# Patient Record
Sex: Male | Born: 1980 | Race: Black or African American | Hispanic: No | Smoking: Current every day smoker
Health system: Southern US, Community
[De-identification: ages and names within clinical notes are randomized; demographics above are authoritative.]

## PROBLEM LIST (undated history)

## (undated) DIAGNOSIS — I10 Essential (primary) hypertension: Secondary | ICD-10-CM

## (undated) DIAGNOSIS — I509 Heart failure, unspecified: Secondary | ICD-10-CM

## (undated) DIAGNOSIS — J45909 Unspecified asthma, uncomplicated: Secondary | ICD-10-CM

## (undated) DIAGNOSIS — I2699 Other pulmonary embolism without acute cor pulmonale: Secondary | ICD-10-CM

## (undated) DIAGNOSIS — F1010 Alcohol abuse, uncomplicated: Secondary | ICD-10-CM

## (undated) DIAGNOSIS — F329 Major depressive disorder, single episode, unspecified: Secondary | ICD-10-CM

## (undated) DIAGNOSIS — F32A Depression, unspecified: Secondary | ICD-10-CM

## (undated) HISTORY — DX: Other pulmonary embolism without acute cor pulmonale: I26.99

## (undated) HISTORY — PX: FRACTURE SURGERY: SHX138

## (undated) HISTORY — DX: Depression, unspecified: F32.A

---
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
## 2007-10-18 ENCOUNTER — Emergency Department (HOSPITAL_COMMUNITY): Admission: EM | Admit: 2007-10-18 | Discharge: 2007-10-18 | Payer: Self-pay | Admitting: Emergency Medicine

## 2012-04-17 ENCOUNTER — Encounter (HOSPITAL_BASED_OUTPATIENT_CLINIC_OR_DEPARTMENT_OTHER): Payer: Self-pay | Admitting: *Deleted

## 2012-04-17 ENCOUNTER — Emergency Department (HOSPITAL_BASED_OUTPATIENT_CLINIC_OR_DEPARTMENT_OTHER)
Admission: EM | Admit: 2012-04-17 | Discharge: 2012-04-17 | Disposition: A | Discharge status: Home / Self Care | Payer: Managed Care, Other (non HMO) | Attending: Emergency Medicine | Admitting: Emergency Medicine

## 2012-04-17 ENCOUNTER — Emergency Department (HOSPITAL_BASED_OUTPATIENT_CLINIC_OR_DEPARTMENT_OTHER): Payer: Managed Care, Other (non HMO)

## 2012-04-17 DIAGNOSIS — F17200 Nicotine dependence, unspecified, uncomplicated: Secondary | ICD-10-CM | POA: Insufficient documentation

## 2012-04-17 DIAGNOSIS — M25579 Pain in unspecified ankle and joints of unspecified foot: Secondary | ICD-10-CM | POA: Insufficient documentation

## 2012-04-17 NOTE — ED Notes (Signed)
Pt c/o upper left ankle pain x 2 days after running.

## 2012-04-17 NOTE — ED Provider Notes (Signed)
History     CSN: [id number]  Arrival date & time 04/17/12  1321   First MD Initiated Contact with Patient 04/17/12 1411      Chief Complaint  Patient presents with  . Ankle Pain    (Consider location/radiation/quality/duration/timing/severity/associated sxs/prior treatment) Patient is a 31 y.o. male presenting with ankle pain. The history is provided by the patient.  Ankle Pain  The incident occurred 2 days ago. The incident occurred at home. Injury mechanism: has been exercising, running hills. The pain is present in the left ankle. The quality of the pain is described as sharp. The pain is moderate. Pain course: hurts with ambulation, bearing weight. The symptoms are aggravated by bearing weight.    History reviewed. No pertinent past medical history.  History reviewed. No pertinent past surgical history.  History reviewed. No pertinent family history.  History  Substance Use Topics  . Smoking status: Current Everyday Smoker -- 1.0 packs/day  . Smokeless tobacco: Not on file  . Alcohol Use: No      Review of Systems  All other systems reviewed and are negative.    Allergies  Review of patient's allergies indicates no known allergies.  Home Medications  No current outpatient prescriptions on file.  BP 149/80  Pulse 82  Temp 98.7 F (37.1 C) (Oral)  Resp 16  Ht 6\' 1"  (1.854 m)  Wt 183 lb (83.008 kg)  BMI 24.14 kg/m2  SpO2 100%  Physical Exam  Nursing note and vitals reviewed. Constitutional: He is oriented to person, place, and time. He appears well-developed and well-nourished. No distress.  HENT:  Head: Normocephalic and atraumatic.  Neck: Normal range of motion. Neck supple.  Musculoskeletal:       There is ttp over the distal fibula.  There is mild swelling and no deformity.    Neurological: He is alert and oriented to person, place, and time.  Skin: Skin is warm and dry. He is not diaphoretic.    ED Course  Procedures (including critical care  time)  Labs Reviewed - No data to display No results found.   No diagnosis found.    MDM  The xrays look okay.  He will be discharged to home with nsaids, rest, follow up if not improving.          Geoffery Lyons, MD 04/17/12 1447

## 2012-10-27 ENCOUNTER — Emergency Department (HOSPITAL_BASED_OUTPATIENT_CLINIC_OR_DEPARTMENT_OTHER)
Admission: EM | Admit: 2012-10-27 | Discharge: 2012-10-27 | Disposition: A | Discharge status: Home / Self Care | Payer: Managed Care, Other (non HMO) | Attending: Emergency Medicine | Admitting: Emergency Medicine

## 2012-10-27 ENCOUNTER — Encounter (HOSPITAL_BASED_OUTPATIENT_CLINIC_OR_DEPARTMENT_OTHER): Payer: Self-pay | Admitting: *Deleted

## 2012-10-27 ENCOUNTER — Emergency Department (HOSPITAL_BASED_OUTPATIENT_CLINIC_OR_DEPARTMENT_OTHER): Payer: Managed Care, Other (non HMO)

## 2012-10-27 DIAGNOSIS — M7989 Other specified soft tissue disorders: Secondary | ICD-10-CM | POA: Insufficient documentation

## 2012-10-27 DIAGNOSIS — M21371 Foot drop, right foot: Secondary | ICD-10-CM

## 2012-10-27 DIAGNOSIS — F17200 Nicotine dependence, unspecified, uncomplicated: Secondary | ICD-10-CM | POA: Insufficient documentation

## 2012-10-27 DIAGNOSIS — M216X9 Other acquired deformities of unspecified foot: Secondary | ICD-10-CM | POA: Insufficient documentation

## 2012-10-27 NOTE — ED Provider Notes (Signed)
Medical screening examination/treatment/procedure(s) were conducted as a shared visit with non-physician practitioner(s) and myself.  I personally evaluated the patient during the encounter   Pt with chronic right peroneal nerve palsy, reports was horsing around, wrestling and injured his ankle.  By next day, had difficulty bringing foot up.  No other numbness or weakness, no h/o sig back injury.  Cousin has muscular dystrophy and DM runs in family, but pt reports he is otherwise healthy.  Denies any sig lower leg injuries in the past except a leg fracture when he was 32 years old, pt isn't even sure which leg he fractured. Denies knee pain or injury.  Plain films here are neg.  I doubt this is lumbar radiculopathy as no pain or injury, fever.  Pt may need nerve conduction study or soft tissue imaging such as MRI or CT of lower leg as outpt, will discuss and refer to Dr. Dion Saucier.  Gavin Pound. Artie Mcintyre, MD 10/27/12 1726

## 2012-10-27 NOTE — ED Provider Notes (Signed)
History     CSN: [id number]  Arrival date & time 10/27/12  1500   First MD Initiated Contact with Patient 10/27/12 1524      Chief Complaint  Patient presents with  . Foot Pain    (Consider location/radiation/quality/duration/timing/severity/associated sxs/prior treatment) Patient is a 32 y.o. male presenting with foot injury. The history is provided by the patient. No language interpreter was used.  Foot Injury  Incident onset: 1 month. Incident location: twist injury 1 month ago. The injury mechanism was torsion. The pain is present in the right foot. The quality of the pain is described as aching. The pain is mild. The pain has been constant since onset. Associated symptoms include inability to bear weight. He reports no foreign bodies present. The symptoms are aggravated by bearing weight. He has tried nothing for the symptoms.  Pt reports he can not pull toes up.  Pt reports he injured foot a month ago but has continued to be unable to lift foot up.  Pt reports problems with walking because he has to lift foot up high and place down carefully in able to step  History reviewed. No pertinent past medical history.  History reviewed. No pertinent past surgical history.  History reviewed. No pertinent family history.  History  Substance Use Topics  . Smoking status: Current Every Day Smoker -- 1.0 packs/day  . Smokeless tobacco: Not on file  . Alcohol Use: No      Review of Systems  Musculoskeletal: Positive for joint swelling and gait problem. Negative for myalgias and back pain.  All other systems reviewed and are negative.    Allergies  Review of patient's allergies indicates no known allergies.  Home Medications  No current outpatient prescriptions on file.  BP 150/90  Pulse 78  Temp 98.1 F (36.7 C) (Oral)  Resp 18  Ht 6' 1.5" (1.867 m)  Wt 180 lb (81.647 kg)  BMI 23.43 kg/m2  SpO2 100%  Physical Exam  Nursing note and vitals reviewed. Constitutional:  He is oriented to person, place, and time. He appears well-developed and well-nourished.  HENT:  Head: Normocephalic.  Right Ear: External ear normal.  Left Ear: External ear normal.  Eyes: Pupils are equal, round, and reactive to light.  Pulmonary/Chest: Effort normal.  Musculoskeletal: He exhibits no edema and no tenderness.       Inability to plantar flex with foot,  Good strength with dorsal flexion.  nv and ns intact  Neurological: He is alert and oriented to person, place, and time. He has normal reflexes.  Skin: Skin is warm.  Psychiatric: He has a normal mood and affect.    ED Course  Procedures (including critical care time)  Labs Reviewed - No data to display No results found.   No diagnosis found.    MDM  Dr.Ghim in to see and examine pt.   X rays no abnormality   I called Dr. Jeannett Senior.   He will see pt in the office this week.   I counseled pt on results  Lonia Skinner [place], [place] 10/27/12 1718

## 2012-10-27 NOTE — ED Notes (Signed)
Pt c/o right foot pain x 1 month

## 2013-05-06 ENCOUNTER — Emergency Department (HOSPITAL_BASED_OUTPATIENT_CLINIC_OR_DEPARTMENT_OTHER)
Admission: EM | Admit: 2013-05-06 | Discharge: 2013-05-06 | Disposition: A | Discharge status: Home / Self Care | Payer: Managed Care, Other (non HMO) | Attending: Emergency Medicine | Admitting: Emergency Medicine

## 2013-05-06 ENCOUNTER — Encounter (HOSPITAL_BASED_OUTPATIENT_CLINIC_OR_DEPARTMENT_OTHER): Payer: Self-pay | Admitting: *Deleted

## 2013-05-06 ENCOUNTER — Emergency Department (HOSPITAL_BASED_OUTPATIENT_CLINIC_OR_DEPARTMENT_OTHER): Payer: Managed Care, Other (non HMO)

## 2013-05-06 DIAGNOSIS — Z79899 Other long term (current) drug therapy: Secondary | ICD-10-CM | POA: Insufficient documentation

## 2013-05-06 DIAGNOSIS — F17200 Nicotine dependence, unspecified, uncomplicated: Secondary | ICD-10-CM | POA: Insufficient documentation

## 2013-05-06 DIAGNOSIS — R0789 Other chest pain: Secondary | ICD-10-CM | POA: Insufficient documentation

## 2013-05-06 LAB — CBC WITH DIFFERENTIAL/PLATELET
Basophils Absolute: 0 10*3/uL (ref 0.0–0.1)
Basophils Relative: 0 % (ref 0–1)
Eosinophils Absolute: 0.1 10*3/uL (ref 0.0–0.7)
Eosinophils Relative: 2 % (ref 0–5)
HCT: 39.7 % (ref 39.0–52.0)
Hemoglobin: 14.3 g/dL (ref 13.0–17.0)
Lymphocytes Relative: 26 % (ref 12–46)
Lymphs Abs: 2 10*3/uL (ref 0.7–4.0)
MCH: 28.3 pg (ref 26.0–34.0)
MCHC: 36 g/dL (ref 30.0–36.0)
MCV: 78.6 fL (ref 78.0–100.0)
Monocytes Absolute: 0.7 10*3/uL (ref 0.1–1.0)
Monocytes Relative: 10 % (ref 3–12)
Neutro Abs: 4.9 10*3/uL (ref 1.7–7.7)
Neutrophils Relative %: 63 % (ref 43–77)
Platelets: 198 10*3/uL (ref 150–400)
RBC: 5.05 MIL/uL (ref 4.22–5.81)
RDW: 13.6 % (ref 11.5–15.5)
WBC: 7.8 10*3/uL (ref 4.0–10.5)

## 2013-05-06 LAB — COMPREHENSIVE METABOLIC PANEL
ALT: 15 U/L (ref 0–53)
AST: 21 U/L (ref 0–37)
Albumin: 4.3 g/dL (ref 3.5–5.2)
Alkaline Phosphatase: 53 U/L (ref 39–117)
BUN: 16 mg/dL (ref 6–23)
CO2: 31 mEq/L (ref 19–32)
Calcium: 10.6 mg/dL — ABNORMAL HIGH (ref 8.4–10.5)
Chloride: 100 mEq/L (ref 96–112)
Creatinine, Ser: 0.9 mg/dL (ref 0.50–1.35)
GFR calc Af Amer: >90 mL/min (ref >90)
GFR calc non Af Amer: >90 mL/min (ref >90)
Glucose, Bld: 99 mg/dL (ref 70–99)
Potassium: 4.4 mEq/L (ref 3.5–5.1)
Sodium: 141 mEq/L (ref 135–145)
Total Bilirubin: 0.4 mg/dL (ref 0.3–1.2)
Total Protein: 7.6 g/dL (ref 6.0–8.3)

## 2013-05-06 LAB — D-DIMER, QUANTITATIVE: D-Dimer, Quant: 0.29 ug/mL-FEU (ref 0.00–0.48)

## 2013-05-06 LAB — LIPASE, BLOOD: Lipase: 34 U/L (ref 11–59)

## 2013-05-06 LAB — TROPONIN I: Troponin I: <0.3 ng/mL (ref <0.30)

## 2013-05-06 MED ORDER — OMEPRAZOLE 20 MG PO CPDR: 20.0000 mg | DELAYED_RELEASE_CAPSULE | Freq: Every day | ORAL | Status: DC

## 2013-05-06 MED ORDER — GI COCKTAIL ~~LOC~~
30.0000 mL | Freq: Once | ORAL | Status: AC
  Administered 2013-05-06: 30 mL via ORAL
  Filled 2013-05-06: qty 30

## 2013-05-06 NOTE — ED Provider Notes (Signed)
History    CSN: [id number] Arrival date & time 05/06/13  3664  First MD Initiated Contact with Patient 05/06/13 [phone]     Chief Complaint  Patient presents with  . Chest Pain   (Consider location/radiation/quality/duration/timing/severity/associated sxs/prior Treatment) HPI Comments: Patient presents with central substernal chest pain has been constant since midnight last night. His been going on for the past 8 hours and has been intermittent.  It is not improved, lasting for a few seconds at a time at most.. It does not radiate. He has not had this pain before. It is worse with deep breathing and worse with eating. It is worse with certain positions. Denies any previous medical problems her cardiac history. No history of hypertension. He is a smoker. Denies any shortness of breath, nausea, diaphoresis, leg pain or swelling. Denies any abdominal pain vomiting, fever chills.  The history is provided by the patient.   History reviewed. No pertinent past medical history. History reviewed. No pertinent past surgical history. No family history on file. History  Substance Use Topics  . Smoking status: Current Every Day Smoker -- 1.00 packs/day  . Smokeless tobacco: Not on file  . Alcohol Use: Yes    Review of Systems  Constitutional: Negative for activity change and appetite change.  HENT: Negative for congestion and rhinorrhea.   Respiratory: Positive for chest tightness. Negative for cough and shortness of breath.   Cardiovascular: Positive for chest pain.  Gastrointestinal: Negative for nausea, vomiting and abdominal pain.  Genitourinary: Negative for dysuria and hematuria.  Musculoskeletal: Negative for back pain.  Skin: Negative for rash.  Neurological: Negative for dizziness, weakness and headaches.  A complete 10 system review of systems was obtained and all systems are negative except as noted in the HPI and PMH.    Allergies  Review of patient's allergies indicates no  known allergies.  Home Medications   Current Outpatient Rx  Name  Route  Sig  Dispense  Refill  . Multiple Vitamins-Minerals (MULTIVITAMIN WITH MINERALS) tablet   Oral   Take 1 tablet by mouth daily.         Marland Kitchen omeprazole (PRILOSEC) 20 MG capsule   Oral   Take 1 capsule (20 mg total) by mouth daily.   30 capsule   0    BP 141/86  Pulse 62  Temp(Src) 98 F (36.7 C) (Oral)  Resp 18  SpO2 100% Physical Exam  Constitutional: He is oriented to person, place, and time. He appears well-developed and well-nourished. No distress.  HENT:  Head: Normocephalic and atraumatic.  Mouth/Throat: Oropharynx is clear and moist. No oropharyngeal exudate.  Eyes: Conjunctivae and EOM are normal. Pupils are equal, round, and reactive to light.  Neck: Normal range of motion. Neck supple.  Cardiovascular: Normal rate, regular rhythm, normal heart sounds and intact distal pulses.   No murmur heard. Pulmonary/Chest: Effort normal and breath sounds normal. No respiratory distress. He exhibits no tenderness.  Abdominal: Soft. There is no tenderness. There is no rebound and no guarding.  Musculoskeletal: Normal range of motion. He exhibits no edema and no tenderness.  Neurological: He is alert and oriented to person, place, and time. No cranial nerve deficit. He exhibits normal muscle tone. Coordination normal.  Skin: Skin is warm.    ED Course  Procedures (including critical care time) Labs Reviewed  COMPREHENSIVE METABOLIC PANEL - Abnormal; Notable for the following:    Calcium 10.6 (*)    All other components within normal limits  CBC WITH  DIFFERENTIAL  TROPONIN I  D-DIMER, QUANTITATIVE  LIPASE, BLOOD   Dg Chest 2 View  05/06/2013   *RADIOLOGY REPORT*  Clinical Data: Chest pain.  CHEST - 2 VIEW  Comparison: No priors.  Findings: Lung volumes are normal.  No consolidative airspace disease.  No pleural effusions.  No pneumothorax.  No pulmonary nodule or mass noted.  Pulmonary vasculature and  the cardiomediastinal silhouette are within normal limits.  IMPRESSION: 1. No radiographic evidence of acute cardiopulmonary disease.   Original Report Authenticated By: Trudie Reed, M.D.   1. Atypical chest pain     MDM  Substernal chest pain since last night that is intermittent for the past 8 hours, lasting a few seconds at a time.. Worse with deep breathing and worse with position change. Atypical for ACS. LVH on EKG.  CXR negative. Troponin and D-dimer negative. No change with GI cocktail. No effusion seen on [place].  Doubt ACS, PE, dissection, PTX, pneumonia. Will treat with PPI, follow up with PCP regarding LVH and hypertension.   Date: 05/06/2013  Rate: 70  Rhythm: normal sinus rhythm  QRS Axis: normal  Intervals: normal  ST/T Wave abnormalities: nonspecific ST/T changes and early repolarization  Conduction Disutrbances:none  Narrative Interpretation: no brugada, no prolonged QT  Old EKG Reviewed: none available   Date: 05/06/2013  Rate: 64  Rhythm: normal sinus rhythm  QRS Axis: normal  Intervals: normal  ST/T Wave abnormalities: nonspecific ST/T changes and early repolarization  Conduction Disutrbances:none  Narrative Interpretation:   Old EKG Reviewed: unchanged      EMERGENCY DEPARTMENT [place] CARDIAC EXAM "Study: Limited Ultrasound of the heart and pericardium"  INDICATIONS: chest pain Multiple views of the heart and pericardium are obtained with a multi-frequency probe.  PERFORMED ZO:[id number]  IMAGES ARCHIVED?: Yes  FINDINGS: No pericardial effusion, Normal contractility and Tamponade physiology absent  LIMITATIONS:    VIEWS USED: Subcostal 4 chamber and Apical 4 chamber   INTERPRETATION: Cardiac activity present, Pericardial effusioin absent, Cardiac tamponade absent and Normal contractility  COMMENT:     Glynn Octave, MD 05/06/13 1242

## 2013-05-06 NOTE — ED Notes (Addendum)
Patient states that he woke up last night around 2300 with central chest pain, hurts to take a breath. Tried to eat breakfast, but chest pain after eating that was intense. States that if he tries to drink anything it hurts worse & certain positions make it hurt more

## 2014-11-24 ENCOUNTER — Emergency Department (HOSPITAL_BASED_OUTPATIENT_CLINIC_OR_DEPARTMENT_OTHER)
Admission: EM | Admit: 2014-11-24 | Discharge: 2014-11-24 | Disposition: A | Discharge status: Home / Self Care | Payer: BLUE CROSS/BLUE SHIELD | Attending: Emergency Medicine | Admitting: Emergency Medicine

## 2014-11-24 ENCOUNTER — Encounter (HOSPITAL_BASED_OUTPATIENT_CLINIC_OR_DEPARTMENT_OTHER): Payer: Self-pay | Admitting: Emergency Medicine

## 2014-11-24 ENCOUNTER — Emergency Department (HOSPITAL_BASED_OUTPATIENT_CLINIC_OR_DEPARTMENT_OTHER): Payer: BLUE CROSS/BLUE SHIELD

## 2014-11-24 DIAGNOSIS — Z720 Tobacco use: Secondary | ICD-10-CM | POA: Diagnosis not present

## 2014-11-24 DIAGNOSIS — X58XXXA Exposure to other specified factors, initial encounter: Secondary | ICD-10-CM | POA: Diagnosis not present

## 2014-11-24 DIAGNOSIS — Y9389 Activity, other specified: Secondary | ICD-10-CM | POA: Diagnosis not present

## 2014-11-24 DIAGNOSIS — S99921A Unspecified injury of right foot, initial encounter: Secondary | ICD-10-CM | POA: Diagnosis present

## 2014-11-24 DIAGNOSIS — S96911A Strain of unspecified muscle and tendon at ankle and foot level, right foot, initial encounter: Secondary | ICD-10-CM

## 2014-11-24 DIAGNOSIS — Y9289 Other specified places as the place of occurrence of the external cause: Secondary | ICD-10-CM | POA: Insufficient documentation

## 2014-11-24 DIAGNOSIS — Y998 Other external cause status: Secondary | ICD-10-CM | POA: Diagnosis not present

## 2014-11-24 MED ORDER — TRAMADOL HCL 50 MG PO TABS: 50.0000 mg | ORAL_TABLET | Freq: Four times a day (QID) | ORAL | Status: DC | PRN

## 2014-11-24 NOTE — ED Notes (Signed)
NP at bedside.

## 2014-11-24 NOTE — ED Notes (Signed)
Right foot pain after injury on Saturday.

## 2014-11-24 NOTE — Discharge Instructions (Signed)
Foot Sprain The muscles and cord like structures which attach muscle to bone (tendons) that surround the feet are made up of units. A foot sprain can occur at the weakest spot in any of these units. This condition is most often caused by injury to or overuse of the foot, as from playing contact sports, or aggravating a previous injury, or from poor conditioning, or obesity. SYMPTOMS  Pain with movement of the foot.  Tenderness and swelling at the injury site.  Loss of strength is present in moderate or severe sprains. THE THREE GRADES OR SEVERITY OF FOOT SPRAIN ARE:  Mild (Grade I): Slightly pulled muscle without tearing of muscle or tendon fibers or loss of strength.  Moderate (Grade II): Tearing of fibers in a muscle, tendon, or at the attachment to bone, with small decrease in strength.  Severe (Grade III): Rupture of the muscle-tendon-bone attachment, with separation of fibers. Severe sprain requires surgical repair. Often repeating (chronic) sprains are caused by overuse. Sudden (acute) sprains are caused by direct injury or over-use. DIAGNOSIS  Diagnosis of this condition is usually by your own observation. If problems continue, a caregiver may be required for further evaluation and treatment. X-rays may be required to make sure there are not breaks in the bones (fractures) present. Continued problems may require physical therapy for treatment. PREVENTION  Use strength and conditioning exercises appropriate for your sport.  Warm up properly prior to working out.  Use athletic shoes that are made for the sport you are participating in.  Allow adequate time for healing. Early return to activities makes repeat injury more likely, and can lead to an unstable arthritic foot that can result in prolonged disability. Mild sprains generally heal in 3 to 10 days, with moderate and severe sprains taking 2 to 10 weeks. Your caregiver can help you determine the proper time required for  healing. HOME CARE INSTRUCTIONS   Apply ice to the injury for 15-20 minutes, 03-04 times per day. Put the ice in a plastic bag and place a towel between the bag of ice and your skin.  An elastic wrap (like an Ace bandage) may be used to keep swelling down.  Keep foot above the level of the heart, or at least raised on a footstool, when swelling and pain are present.  Try to avoid use other than gentle range of motion while the foot is painful. Do not resume use until instructed by your caregiver. Then begin use gradually, not increasing use to the point of pain. If pain does develop, decrease use and continue the above measures, gradually increasing activities that do not cause discomfort, until you gradually achieve normal use.  Use crutches if and as instructed, and for the length of time instructed.  Keep injured foot and ankle wrapped between treatments.  Massage foot and ankle for comfort and to keep swelling down. Massage from the toes up towards the knee.  Only take over-the-counter or prescription medicines for pain, discomfort, or fever as directed by your caregiver. SEEK IMMEDIATE MEDICAL CARE IF:   Your pain and swelling increase, or pain is not controlled with medications.  You have loss of feeling in your foot or your foot turns cold or blue.  You develop new, unexplained symptoms, or an increase of the symptoms that brought you to your caregiver. MAKE SURE YOU:   Understand these instructions.  Will watch your condition.  Will get help right away if you are not doing well or get worse. Document Released:   03/25/2002 Document Revised: 12/26/2011 Document Reviewed: 05/22/2008 Exit[employer] Patient Information 2015 [employer]. This information is not intended to replace advice given to you by your health care provider. Make sure you discuss any questions you have with your health care provider.  

## 2014-11-24 NOTE — ED Provider Notes (Signed)
CSN: [id number]     Arrival date & time 11/24/14  1044 History   First MD Initiated Contact with Patient 11/24/14 1048     Chief Complaint  Patient presents with  . Foot Pain     (Consider location/radiation/quality/duration/timing/severity/associated sxs/prior Treatment) HPI Comments: Injured his right foot 2 days ago and is now having a large amount of pain along the right big toe  Patient is a 34 y.o. male presenting with lower extremity pain. The history is provided by the patient. No language interpreter was used.  Foot Pain This is a new problem. The current episode started today. The problem occurs constantly. The problem has been unchanged. Pertinent negatives include no fever, neck pain or numbness. The symptoms are aggravated by walking. He has tried nothing for the symptoms.    No past medical history on file. No past surgical history on file. No family history on file. History  Substance Use Topics  . Smoking status: Current Every Day Smoker -- 1.00 packs/day  . Smokeless tobacco: Not on file  . Alcohol Use: Yes    Review of Systems  Constitutional: Negative for fever.  Musculoskeletal: Negative for neck pain.  Neurological: Negative for numbness.  All other systems reviewed and are negative.     Allergies  Review of patient's allergies indicates no known allergies.  Home Medications   Prior to Admission medications   Medication Sig Start Date End Date Taking? Authorizing Provider  Multiple Vitamins-Minerals (MULTIVITAMIN WITH MINERALS) tablet Take 1 tablet by mouth daily.    Historical Provider, MD  omeprazole (PRILOSEC) 20 MG capsule Take 1 capsule (20 mg total) by mouth daily. 05/06/13   Glynn Octave, MD   BP 140/86 mmHg  Pulse 67  Temp(Src) 98.2 F (36.8 C)  Resp 16  Ht 6\' 1"  (1.854 m)  Wt 180 lb (81.647 kg)  BMI 23.75 kg/m2  SpO2 100% Physical Exam  Constitutional: He is oriented to person, place, and time. He appears well-developed and  well-nourished.  Cardiovascular: Normal rate and regular rhythm.   Pulmonary/Chest: Effort normal and breath sounds normal. No respiratory distress.  Musculoskeletal: Normal range of motion.  Mild swelling to the right great toe  Neurological: He is alert and oriented to person, place, and time.  Skin: Skin is warm and dry.  Psychiatric: He has a normal mood and affect.  Nursing note and vitals reviewed.   ED Course  Procedures (including critical care time) Labs Review Labs Reviewed - No data to display  Imaging Review Dg Foot Complete Right  11/24/2014   CLINICAL DATA:  Anterior right foot pain after tripping 2 days ago.  EXAM: RIGHT FOOT COMPLETE - 3+ VIEW  COMPARISON:  10/27/2012  FINDINGS: There is no evidence of fracture or dislocation. There is no evidence of arthropathy or other focal bone abnormality. Soft tissues are unremarkable.  IMPRESSION: Negative.   Electronically Signed   By: Charlett Nose M.D.   On: 11/24/2014 11:43     EKG Interpretation None      MDM   Final diagnoses:  Right foot strain, initial encounter    No acute bony injury. Pt placed in post op shoe for comfort. Given ultram and follow up with DR. Vivi Barrack, NP 11/24/14 1149  Vida Roller, MD 11/25/14 1002

## 2015-10-12 ENCOUNTER — Encounter (HOSPITAL_BASED_OUTPATIENT_CLINIC_OR_DEPARTMENT_OTHER): Payer: Self-pay | Admitting: Emergency Medicine

## 2015-10-12 ENCOUNTER — Emergency Department (HOSPITAL_BASED_OUTPATIENT_CLINIC_OR_DEPARTMENT_OTHER)
Admission: EM | Admit: 2015-10-12 | Discharge: 2015-10-12 | Disposition: A | Discharge status: Home / Self Care | Payer: Managed Care, Other (non HMO) | Attending: Emergency Medicine | Admitting: Emergency Medicine

## 2015-10-12 DIAGNOSIS — F17200 Nicotine dependence, unspecified, uncomplicated: Secondary | ICD-10-CM | POA: Insufficient documentation

## 2015-10-12 DIAGNOSIS — S0990XA Unspecified injury of head, initial encounter: Secondary | ICD-10-CM | POA: Insufficient documentation

## 2015-10-12 DIAGNOSIS — I10 Essential (primary) hypertension: Secondary | ICD-10-CM | POA: Insufficient documentation

## 2015-10-12 DIAGNOSIS — Y9389 Activity, other specified: Secondary | ICD-10-CM | POA: Insufficient documentation

## 2015-10-12 DIAGNOSIS — Y92410 Unspecified street and highway as the place of occurrence of the external cause: Secondary | ICD-10-CM | POA: Insufficient documentation

## 2015-10-12 DIAGNOSIS — S46011A Strain of muscle(s) and tendon(s) of the rotator cuff of right shoulder, initial encounter: Secondary | ICD-10-CM

## 2015-10-12 DIAGNOSIS — Z79899 Other long term (current) drug therapy: Secondary | ICD-10-CM | POA: Insufficient documentation

## 2015-10-12 DIAGNOSIS — Y998 Other external cause status: Secondary | ICD-10-CM | POA: Insufficient documentation

## 2015-10-12 HISTORY — DX: Essential (primary) hypertension: I10

## 2015-10-12 MED ORDER — IBUPROFEN 600 MG PO TABS: 600.0000 mg | ORAL_TABLET | Freq: Four times a day (QID) | ORAL | Status: DC | PRN

## 2015-10-12 MED ORDER — IBUPROFEN 800 MG PO TABS
800.0000 mg | ORAL_TABLET | Freq: Once | ORAL | Status: AC
  Administered 2015-10-12: 800 mg via ORAL
  Filled 2015-10-12: qty 1
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
## 2015-12-01 DIAGNOSIS — I10 Essential (primary) hypertension: Secondary | ICD-10-CM | POA: Diagnosis present

## 2016-11-15 ENCOUNTER — Encounter (HOSPITAL_BASED_OUTPATIENT_CLINIC_OR_DEPARTMENT_OTHER): Payer: Self-pay | Admitting: *Deleted

## 2016-11-15 ENCOUNTER — Emergency Department (HOSPITAL_BASED_OUTPATIENT_CLINIC_OR_DEPARTMENT_OTHER)
Admission: EM | Admit: 2016-11-15 | Discharge: 2016-11-15 | Disposition: A | Discharge status: Home / Self Care | Payer: Managed Care, Other (non HMO) | Attending: Emergency Medicine | Admitting: Emergency Medicine

## 2016-11-15 DIAGNOSIS — Z79899 Other long term (current) drug therapy: Secondary | ICD-10-CM | POA: Insufficient documentation

## 2016-11-15 DIAGNOSIS — H81391 Other peripheral vertigo, right ear: Secondary | ICD-10-CM | POA: Insufficient documentation

## 2016-11-15 DIAGNOSIS — I10 Essential (primary) hypertension: Secondary | ICD-10-CM | POA: Insufficient documentation

## 2016-11-15 DIAGNOSIS — F17200 Nicotine dependence, unspecified, uncomplicated: Secondary | ICD-10-CM | POA: Insufficient documentation

## 2016-11-15 MED ORDER — MECLIZINE HCL 25 MG PO TABS: 25.0000 mg | ORAL_TABLET | Freq: Three times a day (TID) | ORAL | 0 refills | Status: DC | PRN

## 2016-11-15 MED FILL — MECLIZINE 25 MG TABLET: 25 | 10 days supply | Qty: 30 | Fill #0

## 2016-11-15 NOTE — ED Triage Notes (Signed)
Pt reports he started feeling dizzy yesterday and checked his bp at home, it was 170"s per pt. He called his pcp who called in a prescription for him. Pt states he is between insurances and ran out of the bp meds he was on for years, did not take x 1 week. States he took 2 doses last night, and a dose this morning.

## 2016-11-15 NOTE — ED Provider Notes (Signed)
[facility]   CSN: [id number] Arrival date & time: 11/15/16  0809     History   Chief Complaint Chief Complaint  Patient presents with  . Dizziness    HPI Mario Beck is a 36 y.o. male.  36 yo M with a cc of dizziness. Going on for past couple of days.  Denies chest pain,sob, abdominal pain.  Denies URI like symptoms. Worse with head movement, only lasts for a few seconds.  He is concerned about his blood pressure being elevated. Has been without his medications for a little bit and then just started taking them again recently. Had a high blood pressure at home.   The history is provided by the patient.  Dizziness  Quality:  Head spinning and imbalance Severity:  Moderate Onset quality:  Sudden Duration:  2 days Timing:  Constant Progression:  Unchanged Chronicity:  New Context: head movement and standing up   Relieved by:  Being still and change in position Worsened by:  Nothing Ineffective treatments:  None tried Associated symptoms: no chest pain, no diarrhea, no headaches, no palpitations, no shortness of breath and no vomiting     Past Medical History:  Diagnosis Date  . Hypertension     There are no active problems to display for this patient.   History reviewed. No pertinent surgical history.     Home Medications    Prior to Admission medications   Medication Sig Start Date End Date Taking? Authorizing Provider  lisinopril (PRINIVIL,ZESTRIL) 10 MG tablet Take 10 mg by mouth daily.   Yes Historical Provider, MD  ibuprofen (ADVIL,MOTRIN) 600 MG tablet Take 1 tablet (600 mg total) by mouth every 6 (six) hours as needed. 10/12/15   Lyndal Pulley, MD  meclizine (ANTIVERT) 25 MG tablet Take 1 tablet (25 mg total) by mouth 3 (three) times daily as needed. 11/15/16   Melene Plan, DO  Multiple Vitamins-Minerals (MULTIVITAMIN WITH MINERALS) tablet Take 1 tablet by mouth daily.    Historical Provider, MD    Family History History  reviewed. No pertinent family history.  Social History Social History  Substance Use Topics  . Smoking status: Current Every Day Smoker    Packs/day: 1.00  . Smokeless tobacco: Never Used  . Alcohol use Yes     Allergies   Shellfish allergy   Review of Systems Review of Systems  Constitutional: Negative for chills and fever.  HENT: Negative for congestion and facial swelling.   Eyes: Negative for discharge and visual disturbance.  Respiratory: Negative for shortness of breath.   Cardiovascular: Negative for chest pain and palpitations.  Gastrointestinal: Negative for abdominal pain, diarrhea and vomiting.  Musculoskeletal: Negative for arthralgias and myalgias.  Skin: Negative for color change and rash.  Neurological: Positive for dizziness. Negative for tremors, syncope and headaches.  Psychiatric/Behavioral: Negative for confusion and dysphoric mood.     Physical Exam Updated Vital Signs BP (!) 152/113 (BP Location: Right Arm)   Pulse 93   Temp 98.9 F (37.2 C) (Oral)   Resp 20   Ht 6\' 2"  (1.88 m)   Wt 195 lb (88.5 kg)   SpO2 100%   BMI 25.04 kg/m   Physical Exam  Constitutional: He is oriented to person, place, and time. He appears well-developed and well-nourished.  HENT:  Head: Normocephalic and atraumatic.  Eyes: EOM are normal. Pupils are equal, round, and reactive to light.  Neck: Normal range of motion. Neck supple. No JVD present.  Cardiovascular: Normal rate  and regular rhythm.  Exam reveals no gallop and no friction rub.   No murmur heard. Pulmonary/Chest: No respiratory distress. He has no wheezes.  Abdominal: He exhibits no distension and no mass. There is no tenderness. There is no rebound and no guarding.  Musculoskeletal: Normal range of motion.  Neurological: He is alert and oriented to person, place, and time. He has normal strength. No cranial nerve deficit or sensory deficit. He displays a negative Romberg sign. GCS eye subscore is 4. GCS  verbal subscore is 5. GCS motor subscore is 6. He displays no Babinski's sign on the right side. He displays no Babinski's sign on the left side.  Reflex Scores:      Tricep reflexes are 2+ on the right side and 2+ on the left side.      Bicep reflexes are 2+ on the right side and 2+ on the left side.      Brachioradialis reflexes are 2+ on the right side and 2+ on the left side.      Patellar reflexes are 2+ on the right side and 2+ on the left side.      Achilles reflexes are 2+ on the right side and 2+ on the left side. R sided fast going nystagmus  Skin: No rash noted. No pallor.  Psychiatric: He has a normal mood and affect. His behavior is normal.  Nursing note and vitals reviewed.    ED Treatments / Results  Labs (all labs ordered are listed, but only abnormal results are displayed) Labs Reviewed - No data to display  EKG  EKG Interpretation None       Radiology No results found.  Procedures Procedures (including critical care time)  Medications Ordered in ED Medications - No data to display   Initial Impression / Assessment and Plan / ED Course  I have reviewed the triage vital signs and the nursing notes.  Pertinent labs & imaging results that were available during my care of the patient were reviewed by me and considered in my medical decision making (see chart for details).     36 yo M With a chief complaints of dizziness. He describes this as a sensation of unsteadiness in the room spinning. He has right-sided chest going nystagmus on exam. Is able to ambulate without difficulty. Feel this most likely peripheral based on history and physical. Will start on meclizine. PCP follow-up.  9:12 AM:  I have discussed the diagnosis/risks/treatment options with the patient and family and believe the pt to be eligible for discharge home to follow-up with PCP. We also discussed returning to the ED immediately if new or worsening sx occur. We discussed the sx which are  most concerning (e.g., sudden worsening dizziness, unable to walk, constant symptoms) that necessitate immediate return. Medications administered to the patient during their visit and any new prescriptions provided to the patient are listed below.  Medications given during this visit Medications - No data to display   The patient appears reasonably screen and/or stabilized for discharge and I doubt any other medical condition or other [facility] requiring further screening, evaluation, or treatment in the ED at this time prior to discharge.    Final Clinical Impressions(s) / ED Diagnoses   Final diagnoses:  Peripheral vertigo involving right ear    New Prescriptions New Prescriptions   MECLIZINE (ANTIVERT) 25 MG TABLET    Take 1 tablet (25 mg total) by mouth 3 (three) times daily as needed.     Melene Plan,  DO 11/15/16 [id number]

## 2016-11-15 NOTE — Discharge Instructions (Signed)
Follow up with your PCP, return for worsening symptoms, sob, inability to walk

## 2018-01-23 ENCOUNTER — Other Ambulatory Visit: Payer: Self-pay

## 2018-01-23 ENCOUNTER — Encounter (HOSPITAL_BASED_OUTPATIENT_CLINIC_OR_DEPARTMENT_OTHER): Payer: Self-pay | Admitting: *Deleted

## 2018-01-23 ENCOUNTER — Emergency Department (HOSPITAL_BASED_OUTPATIENT_CLINIC_OR_DEPARTMENT_OTHER)
Admission: EM | Admit: 2018-01-23 | Discharge: 2018-01-23 | Disposition: A | Discharge status: Home / Self Care | Payer: Managed Care, Other (non HMO) | Attending: Emergency Medicine | Admitting: Emergency Medicine

## 2018-01-23 DIAGNOSIS — R61 Generalized hyperhidrosis: Secondary | ICD-10-CM | POA: Insufficient documentation

## 2018-01-23 DIAGNOSIS — F17210 Nicotine dependence, cigarettes, uncomplicated: Secondary | ICD-10-CM | POA: Diagnosis not present

## 2018-01-23 DIAGNOSIS — Z79899 Other long term (current) drug therapy: Secondary | ICD-10-CM | POA: Insufficient documentation

## 2018-01-23 DIAGNOSIS — I10 Essential (primary) hypertension: Secondary | ICD-10-CM | POA: Diagnosis not present

## 2018-01-23 MED ORDER — AMLODIPINE BESYLATE 10 MG PO TABS: 10.0000 mg | ORAL_TABLET | Freq: Every day | ORAL | 0 refills | Status: DC

## 2018-01-23 MED ORDER — AMLODIPINE BESYLATE 5 MG PO TABS
10.0000 mg | ORAL_TABLET | Freq: Once | ORAL | Status: AC
  Administered 2018-01-23: 10 mg via ORAL
  Filled 2018-01-23: qty 2

## 2018-01-23 MED FILL — AMLODIPINE BESYLATE 10 MG T: 10 | 30 days supply | Qty: 30 | Fill #0

## 2018-01-23 NOTE — ED Notes (Signed)
Signature pad not working in room. Pt verbalized understanding of d/c instructions and need for f/u.

## 2018-01-23 NOTE — ED Notes (Signed)
Pt on monitor 

## 2018-01-23 NOTE — Discharge Instructions (Signed)
Your blood pressure was elevated today. Eat a low salt/low sodium diet, and take all of your home blood pressure medications as directed. Keep a log of your blood pressure readings from every morning and evening (making sure to give yourself at least 15 minutes of rest prior to checking it) and take it to your doctor's office at your next appointment for ongoing management of your blood pressure. Stay well hydrated and get plenty of rest. Avoid caffeine and other over the counter products that would make your blood pressure go up (such as decongestants, excedrin, etc). Follow up with your regular doctor in 1 week for recheck of symptoms and ongoing management of your blood pressure. Return to the ER for emergent changes or worsening symptoms.   °

## 2018-01-23 NOTE — ED Triage Notes (Signed)
Pt has hx or Hypertension. Has been out of medication for 6 months. Doesn't have a PCP. Has been waking up out of his sleep soaking wet. Decided to come get checked out.

## 2018-01-23 NOTE — ED Provider Notes (Signed)
[facility] EMERGENCY DEPARTMENT Provider Note   CSN: [id number] Arrival date & time: 01/23/18  1026     History   Chief Complaint Chief Complaint  Patient presents with  . Hypertension    HPI Mario Beck is a 37 y.o. male with a PMHx of HTN, who presents to the ED with complaints of feeling that his blood pressure is elevated for the last week.  Patient states that he was previously on amlodipine 10 mg however he has been out of this for 6 months because he recently moved here.  Over the last 1 week he will occasionally get a jittery feeling like an anxious feeling and thinks this is when his blood pressure is up, although he is not checking it.  He also has noticed that he has been having night sweats.  He has been stressed lately with his job and thinks this is making his symptoms worse.  He has tried apple cider vinegar with no relief of his symptoms.  He was concerned that his blood pressure was elevated so he came in for further evaluation.  He denies any fevers, chills, diaphoresis, headache, vision changes, lightheadedness, dizziness, unintentional weight loss, cough, palpitations, leg swelling, CP, SOB, abd pain, N/V/D/C, hematuria, dysuria, myalgias, arthralgias, numbness, tingling, focal weakness, or any other complaints at this time.  He has not been homeless, traveled outside of the country, or recently been incarcerated.  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    The history is provided by the patient and medical records. No language interpreter was used.  Hypertension  This is a chronic problem. The current episode started more than 1 week ago. The problem occurs constantly. The problem has not changed since onset.Pertinent negatives include no chest pain, no abdominal pain, no headaches and no shortness of breath. The symptoms are aggravated by stress. Nothing relieves the symptoms. Treatments tried: apple cider vinegar. The treatment provided no relief.    Past Medical  History:  Diagnosis Date  . Hypertension     There are no active problems to display for this patient.   History reviewed. No pertinent surgical history.      Home Medications    Prior to Admission medications   Medication Sig Start Date End Date Taking? Authorizing Provider  ibuprofen (ADVIL,MOTRIN) 600 MG tablet Take 1 tablet (600 mg total) by mouth every 6 (six) hours as needed. 10/12/15   Lyndal Pulley, MD  lisinopril (PRINIVIL,ZESTRIL) 10 MG tablet Take 10 mg by mouth daily.    [provider]  meclizine (ANTIVERT) 25 MG tablet Take 1 tablet (25 mg total) by mouth 3 (three) times daily as needed. 11/15/16   Melene Plan, DO  Multiple Vitamins-Minerals (MULTIVITAMIN WITH MINERALS) tablet Take 1 tablet by mouth daily.    [provider]    Family History History reviewed. No pertinent family history.  Social History Social History   Tobacco Use  . Smoking status: Current Every Day Smoker    Packs/day: 1.00  . Smokeless tobacco: Never Used  Substance Use Topics  . Alcohol use: Yes  . Drug use: No     Allergies   Shellfish allergy   Review of Systems Review of Systems  Constitutional: Negative for chills, diaphoresis, fever and unexpected weight change.       +night sweats  Eyes: Negative for visual disturbance.  Respiratory: Negative for cough and shortness of breath.   Cardiovascular: Negative for chest pain, palpitations and leg swelling.  Gastrointestinal: Negative for abdominal  pain, constipation, diarrhea, nausea and vomiting.  Genitourinary: Negative for dysuria and hematuria.  Musculoskeletal: Negative for arthralgias and myalgias.  Skin: Negative for color change.  Allergic/Immunologic: Negative for immunocompromised state.  Neurological: Negative for dizziness, weakness, light-headedness, numbness and headaches.  Psychiatric/Behavioral: Negative for confusion. The patient is nervous/anxious.    All other systems reviewed and are  negative for acute change except as noted in the HPI.    Physical Exam Updated Vital Signs BP (!) 174/114 (BP Location: Right Arm) Comment: Patient is out of his BP medication  Pulse 95 Comment: Per PA Emery Dupuy, Pts heart rate while in the room with him.  Temp 98.6 F (37 C) (Oral)   Resp 18   Ht 6\' 2"  (1.88 m)   Wt 83.9 kg (185 lb)   SpO2 98%   BMI 23.75 kg/m    Physical Exam  Constitutional: He is oriented to person, place, and time. Vital signs are normal. He appears well-developed and well-nourished.  Non-toxic appearance. No distress.  Afebrile, nontoxic, NAD, BP elevated in the 170s/110s which is somewhat similar to prior visits  HENT:  Head: Normocephalic and atraumatic.  Mouth/Throat: Oropharynx is clear and moist and mucous membranes are normal.  Eyes: Conjunctivae and EOM are normal. Right eye exhibits no discharge. Left eye exhibits no discharge.  Neck: Normal range of motion. Neck supple.  Cardiovascular: Normal rate, regular rhythm, normal heart sounds and intact distal pulses. Exam reveals no gallop and no friction rub.  No murmur heard. HR in the 90s during entirety of visit; RRR, nl s1/s2, no m/r/g, distal pulses intact, no pedal edema   Pulmonary/Chest: Effort normal and breath sounds normal. No respiratory distress. He has no decreased breath sounds. He has no wheezes. He has no rhonchi. He has no rales.  CTAB in all lung fields, no w/r/r, no hypoxia or increased WOB, speaking in full sentences, SpO2 99% on RA   Abdominal: Soft. Normal appearance and bowel sounds are normal. He exhibits no distension. There is no tenderness. There is no rigidity, no rebound, no guarding, no CVA tenderness, no tenderness at McBurney's point and negative Murphy's sign.  Musculoskeletal: Normal range of motion.  Neurological: He is alert and oriented to person, place, and time. He has normal strength. No sensory deficit.  Skin: Skin is warm, dry and intact. No rash noted.  Psychiatric: He  has a normal mood and affect.  Nursing note and vitals reviewed.    ED Treatments / Results  Labs (all labs ordered are listed, but only abnormal results are displayed) Labs Reviewed - No data to display  EKG EKG Interpretation  Date/Time:  Tuesday January 23 2018 11:33:24 EDT Ventricular Rate:  98 PR Interval:    QRS Duration: 86 QT Interval:  384 QTC Calculation: 491 R Axis:   70 Text Interpretation:  Sinus rhythm Left ventricular hypertrophy Prolonged QT interval QT slightly more prolonged than previous  Confirmed by Richardean Canal [phone]) on 01/23/2018 12:52:04 PM   Radiology No results found.  Procedures Procedures (including critical care time)  Medications Ordered in ED Medications  amLODipine (NORVASC) tablet 10 mg (10 mg Oral Given 01/23/18 1144)     Initial Impression / Assessment and Plan / ED Course  I have reviewed the triage vital signs and the nursing notes.  Pertinent labs & imaging results that were available during my care of the patient were reviewed by me and considered in my medical decision making (see chart for details).  37 y.o. male here with concern about his HTN x1 wk. States he feels anxious and jittery, thinks his BP is high although he doesn't check it. He also has had night sweats. No other associated symptoms, otherwise he's completely asymptomatic. On exam, no tachycardia (this was originally documented, but during exam his HR was in the 90s the entire time), BP 170s/110s which is similar to prior visits in the past; no pedal edema, clear lung exam, no other concerning exam findings. He has no unintentional weight loss or cough to make me think about cancer/TB as source of his night sweats. It could just be from his HTN. Will give his home dose of meds here, and get EKG, however doubt need for labs given that he's completely asymptomatic with no s/sx of end-organ damage. Will reassess shortly.   1:38 PM EKG with LVH and borderline QT  prolongation, both slightly more pronounced than prior EKG in 2013/02/17, however overall otherwise fairly similar. No acute ischemic findings. BP continues to steadily improve here with his BP meds. Given lack of concerning s/sx, doubt need for further emergent work up at this time. Will provide refill of his HTN meds, advised DASH diet, keeping log of BP for his PCP visit, and f/up with PCP in 1wk for ongoing management of his HTN. Discussed avoidance of things that would exacerbate his HTN. I explained the diagnosis and have given explicit precautions to return to the ER including for any other new or worsening symptoms. The patient understands and accepts the medical plan as it's been dictated and I have answered their questions. Discharge instructions concerning home care and prescriptions have been given. The patient is STABLE and is discharged to home in good condition.    Final Clinical Impressions(s) / ED Diagnoses   Final diagnoses:  Essential hypertension  Night sweats    ED Discharge Orders        Ordered    amLODipine (NORVASC) 10 MG tablet  Daily     01/23/18 [address] [place], [place] 01/23/18 1338    Charlynne Pander, MD 01/24/18 [phone]

## 2018-02-01 ENCOUNTER — Emergency Department (HOSPITAL_BASED_OUTPATIENT_CLINIC_OR_DEPARTMENT_OTHER)
Admission: EM | Admit: 2018-02-01 | Discharge: 2018-02-01 | Disposition: A | Discharge status: Home / Self Care | Payer: Managed Care, Other (non HMO) | Attending: Emergency Medicine | Admitting: Emergency Medicine

## 2018-02-01 ENCOUNTER — Encounter (HOSPITAL_BASED_OUTPATIENT_CLINIC_OR_DEPARTMENT_OTHER): Payer: Self-pay | Admitting: Emergency Medicine

## 2018-02-01 ENCOUNTER — Emergency Department (HOSPITAL_BASED_OUTPATIENT_CLINIC_OR_DEPARTMENT_OTHER): Payer: Managed Care, Other (non HMO)

## 2018-02-01 ENCOUNTER — Other Ambulatory Visit: Payer: Self-pay

## 2018-02-01 DIAGNOSIS — R079 Chest pain, unspecified: Secondary | ICD-10-CM | POA: Diagnosis present

## 2018-02-01 DIAGNOSIS — I10 Essential (primary) hypertension: Secondary | ICD-10-CM | POA: Insufficient documentation

## 2018-02-01 DIAGNOSIS — R000 Tachycardia, unspecified: Secondary | ICD-10-CM

## 2018-02-01 DIAGNOSIS — R0789 Other chest pain: Secondary | ICD-10-CM

## 2018-02-01 DIAGNOSIS — R748 Abnormal levels of other serum enzymes: Secondary | ICD-10-CM | POA: Diagnosis not present

## 2018-02-01 DIAGNOSIS — J45909 Unspecified asthma, uncomplicated: Secondary | ICD-10-CM | POA: Insufficient documentation

## 2018-02-01 DIAGNOSIS — F17210 Nicotine dependence, cigarettes, uncomplicated: Secondary | ICD-10-CM | POA: Diagnosis not present

## 2018-02-01 DIAGNOSIS — R778 Other specified abnormalities of plasma proteins: Secondary | ICD-10-CM

## 2018-02-01 DIAGNOSIS — Z79899 Other long term (current) drug therapy: Secondary | ICD-10-CM | POA: Diagnosis not present

## 2018-02-01 DIAGNOSIS — R7989 Other specified abnormal findings of blood chemistry: Secondary | ICD-10-CM

## 2018-02-01 DIAGNOSIS — R002 Palpitations: Secondary | ICD-10-CM | POA: Insufficient documentation

## 2018-02-01 HISTORY — DX: Unspecified asthma, uncomplicated: J45.909

## 2018-02-01 LAB — CBC WITH DIFFERENTIAL/PLATELET
Basophils Absolute: 0 10*3/uL (ref 0.0–0.1)
Basophils Relative: 0 %
Eosinophils Absolute: 0.1 10*3/uL (ref 0.0–0.7)
Eosinophils Relative: 1 %
HCT: 39.7 % (ref 39.0–52.0)
Hemoglobin: 14.7 g/dL (ref 13.0–17.0)
Lymphocytes Relative: 10 %
Lymphs Abs: 0.8 10*3/uL (ref 0.7–4.0)
MCH: 29.1 pg (ref 26.0–34.0)
MCHC: 37 g/dL — ABNORMAL HIGH (ref 30.0–36.0)
MCV: 78.6 fL (ref 78.0–100.0)
Monocytes Absolute: 0.7 10*3/uL (ref 0.1–1.0)
Monocytes Relative: 9 %
Neutro Abs: 6.6 10*3/uL (ref 1.7–7.7)
Neutrophils Relative %: 80 %
Platelets: 249 10*3/uL (ref 150–400)
RBC: 5.05 MIL/uL (ref 4.22–5.81)
RDW: 14.2 % (ref 11.5–15.5)
WBC: 8.2 10*3/uL (ref 4.0–10.5)

## 2018-02-01 LAB — BASIC METABOLIC PANEL
Anion gap: 13 (ref 5–15)
BUN: 10 mg/dL (ref 6–20)
CO2: 24 mmol/L (ref 22–32)
Calcium: 9.2 mg/dL (ref 8.9–10.3)
Chloride: 101 mmol/L (ref 101–111)
Creatinine, Ser: 0.75 mg/dL (ref 0.61–1.24)
GFR calc Af Amer: >60 mL/min (ref >60)
GFR calc non Af Amer: >60 mL/min (ref >60)
Glucose, Bld: 93 mg/dL (ref 65–99)
Potassium: 3.8 mmol/L (ref 3.5–5.1)
Sodium: 138 mmol/L (ref 135–145)

## 2018-02-01 LAB — TROPONIN I
Troponin I: 0.03 ng/mL (ref <0.03)
Troponin I: 0.03 ng/mL (ref <0.03)

## 2018-02-01 LAB — D-DIMER, QUANTITATIVE: D-Dimer, Quant: 0.63 ug/mL-FEU — ABNORMAL HIGH (ref 0.00–0.50)

## 2018-02-01 MED ORDER — SODIUM CHLORIDE 0.9 % IV BOLUS
1000.0000 mL | Freq: Once | INTRAVENOUS | Status: AC
  Administered 2018-02-01: 1000 mL via INTRAVENOUS

## 2018-02-01 MED ORDER — IOPAMIDOL (ISOVUE-370) INJECTION 76%
100.0000 mL | Freq: Once | INTRAVENOUS | Status: AC | PRN
  Administered 2018-02-01: 100 mL via INTRAVENOUS

## 2018-02-01 MED ORDER — IOPAMIDOL (ISOVUE-300) INJECTION 61%: 100.0000 mL | Freq: Once | INTRAVENOUS | Status: DC | PRN

## 2018-02-01 NOTE — ED Triage Notes (Addendum)
Pt sts his bp is remaining high although he is taking the med prescribed for him in the ED. Sts this morning he could see his chest "palpitating really hard" on the left side so he came back to Ed. After triage pt added that he is "experiencing a mild pain" in his low sternal area and to the right low chest.

## 2018-02-01 NOTE — ED Provider Notes (Signed)
[facility] EMERGENCY DEPARTMENT Provider Note   CSN: [id number] Arrival date & time: 02/01/18  4098     History   Chief Complaint Chief Complaint  Patient presents with  . Hypertension    HPI Mario Beck is a 37 y.o. male.  37 yo M with a chief complaint of chest pain.  Patient states he is just felt the past day or 2.  Feels that he is a bit anxious and that his heart is been beating fast.  Has some mild discomfort to the right chest just underneath the nipple.  Seems to come and go.  Nothing really seems to make it better or worse.  Denies nausea or vomiting.  Patient has a history of poorly controlled hypertension and is not followed with his doctor regularly.  Is currently being treated by the emergency department.  He was seen 2 weeks ago and feels that his blood pressure should be down after starting amlodipine 10 mg.  The history is provided by the patient.  Hypertension  This is a new problem. The current episode started more than 1 week ago. The problem occurs constantly. The problem has not changed since onset.Associated symptoms include chest pain. Pertinent negatives include no abdominal pain, no headaches and no shortness of breath. Nothing aggravates the symptoms. Nothing relieves the symptoms. He has tried nothing for the symptoms. The treatment provided no relief.    Past Medical History:  Diagnosis Date  . Asthma   . Hypertension     There are no active problems to display for this patient.   History reviewed. No pertinent surgical history.      Home Medications    Prior to Admission medications   Medication Sig Start Date End Date Taking? Authorizing Provider  amLODipine (NORVASC) 10 MG tablet Take 1 tablet (10 mg total) by mouth daily. 01/23/18   Street, [place], PA-C  ibuprofen (ADVIL,MOTRIN) 600 MG tablet Take 1 tablet (600 mg total) by mouth every 6 (six) hours as needed. 10/12/15   Lyndal Pulley, MD  lisinopril (PRINIVIL,ZESTRIL) 10 MG  tablet Take 10 mg by mouth daily.    [provider]  meclizine (ANTIVERT) 25 MG tablet Take 1 tablet (25 mg total) by mouth 3 (three) times daily as needed. 11/15/16   Melene Plan, DO  Multiple Vitamins-Minerals (MULTIVITAMIN WITH MINERALS) tablet Take 1 tablet by mouth daily.    [provider]    Family History No family history on file.  Social History Social History   Tobacco Use  . Smoking status: Current Every Day Smoker    Packs/day: 1.00  . Smokeless tobacco: Never Used  Substance Use Topics  . Alcohol use: Yes    Comment: occ  . Drug use: No     Allergies   Lisinopril and Shellfish allergy   Review of Systems Review of Systems  Constitutional: Negative for chills and fever.  HENT: Negative for congestion and facial swelling.   Eyes: Negative for discharge and visual disturbance.  Respiratory: Negative for shortness of breath.   Cardiovascular: Positive for chest pain and palpitations.  Gastrointestinal: Negative for abdominal pain, diarrhea and vomiting.  Musculoskeletal: Negative for arthralgias and myalgias.  Skin: Negative for color change and rash.  Neurological: Negative for tremors, syncope and headaches.  Psychiatric/Behavioral: Negative for confusion and dysphoric mood.     Physical Exam Updated Vital Signs BP (!) 157/107   Pulse (!) 108   Temp 99 F (37.2 C) (Oral)   Resp (!) 21  SpO2 100%   Physical Exam  Constitutional: He is oriented to person, place, and time. He appears well-developed and well-nourished.  HENT:  Head: Normocephalic and atraumatic.  Eyes: Pupils are equal, round, and reactive to light. EOM are normal.  Neck: Normal range of motion. Neck supple. No JVD present.  Cardiovascular: Regular rhythm. Tachycardia present. Exam reveals no gallop and no friction rub.  No murmur heard. Pulmonary/Chest: No respiratory distress. He has no wheezes.  Abdominal: He exhibits no distension and no mass. There is no  tenderness. There is no rebound and no guarding.  Musculoskeletal: Normal range of motion.  Neurological: He is alert and oriented to person, place, and time.  Skin: No rash noted. No pallor.  Psychiatric: He has a normal mood and affect. His behavior is normal.  Nursing note and vitals reviewed.    ED Treatments / Results  Labs (all labs ordered are listed, but only abnormal results are displayed) Labs Reviewed  CBC WITH DIFFERENTIAL/PLATELET - Abnormal; Notable for the following components:      Result Value   MCHC 37.0 (*)    All other components within normal limits  D-DIMER, QUANTITATIVE (NOT AT [facility]) - Abnormal; Notable for the following components:   D-Dimer, Quant 0.63 (*)    All other components within normal limits  TROPONIN I - Abnormal; Notable for the following components:   Troponin I 0.03 (*)    All other components within normal limits  TROPONIN I - Abnormal; Notable for the following components:   Troponin I 0.03 (*)    All other components within normal limits  BASIC METABOLIC PANEL    EKG EKG Interpretation  Date/Time:  Thursday February 01 2018 09:45:25 EDT Ventricular Rate:  106 PR Interval:    QRS Duration: 83 QT Interval:  330 QTC Calculation: 439 R Axis:   62 Text Interpretation:  Sinus tachycardia Left ventricular hypertrophy new isolated flipped t in III Otherwise no significant change Confirmed by Melene Plan [phone]) on 02/01/2018 10:38:11 AM   Radiology Dg Chest 2 View  Result Date: 02/01/2018 CLINICAL DATA:  Chest pain EXAM: CHEST - 2 VIEW COMPARISON:  05/06/2013 FINDINGS: Heart and mediastinal contours are within normal limits. No focal opacities or effusions. No acute bony abnormality. IMPRESSION: No active cardiopulmonary disease. Electronically Signed   By: Charlett Nose M.D.   On: 02/01/2018 10:55   Ct Angio Chest Pe W And/or Wo Contrast  Result Date: 02/01/2018 CLINICAL DATA:  Hypertension EXAM: CT ANGIOGRAPHY CHEST WITH CONTRAST  TECHNIQUE: Multidetector CT imaging of the chest was performed using the standard protocol during bolus administration of intravenous contrast. Multiplanar CT image reconstructions and MIPs were obtained to evaluate the vascular anatomy. CONTRAST:  ISOVUE-370 IOPAMIDOL (ISOVUE-370) INJECTION 76% COMPARISON:  None. FINDINGS: Cardiovascular: Thoracic aorta demonstrates no aneurysmal dilatation or dissection. No significant atherosclerotic calcifications are seen. The pulmonary artery shows a normal branching pattern without filling defect to suggest pulmonary embolism. No cardiac enlargement is seen. No significant coronary calcifications are noted. Mediastinum/Nodes: The thoracic inlet is unremarkable. No hilar or mediastinal adenopathy is noted. The esophagus is within normal limits. Lungs/Pleura: The lungs are well aerated bilaterally. No focal infiltrate or sizable effusion is seen. No sizable parenchymal nodule is noted. Upper Abdomen: No acute abnormality. Musculoskeletal: No acute bony abnormality is noted. Review of the MIP images confirms the above findings. IMPRESSION: No evidence of pulmonary emboli.  No acute abnormality is noted. Electronically Signed   By: Eulah Pont.D.  On: 02/01/2018 12:06    Procedures Procedures (including critical care time)  Medications Ordered in ED Medications  sodium chloride 0.9 % bolus 1,000 mL (0 mLs Intravenous Stopped 02/01/18 1117)  iopamidol (ISOVUE-370) 76 % injection 100 mL (100 mLs Intravenous Contrast Given 02/01/18 1138)     Initial Impression / Assessment and Plan / ED Course  I have reviewed the triage vital signs and the nursing notes.  Pertinent labs & imaging results that were available during my care of the patient were reviewed by me and considered in my medical decision making (see chart for details).     37 yo M with a cc of chest pain.  Having some anxiety as well.  Pain to the right lower chest wall.  Nothing makes better or  worse.  Unable to [facility] due to tachycardia.   Old records were reviewed the patient was recently seen for a symptomatic hypertension started on amlodipine.  He recently moved to the area and has not yet established follow-up.  His troponin was elevated but at the very lowest end of the laboratory spectrum.  This was repeated and was again the same.  As it is not changing that reassuring but I am unsure why the patient has a positive troponin.  He has never had one in the ED before.  He is also persistently tachycardic.  CT scan of the chest was negative for PE.  I discussed the results of the patient and offered admission into the hospital.  At this point the patient is declining discharge and he will follow-up with his family physician in the office tomorrow.  He will return for any worsening symptoms.  CRITICAL CARE Performed by: Rae Roam   Total critical care time: 35 minutes  Critical care time was exclusive of separately billable procedures and treating other patients.  Critical care was necessary to treat or prevent imminent or life-threatening deterioration.  Critical care was time spent personally by me on the following activities: development of treatment plan with patient and/or surrogate as well as nursing, discussions with consultants, evaluation of patient's response to treatment, examination of patient, obtaining history from patient or surrogate, ordering and performing treatments and interventions, ordering and review of laboratory studies, ordering and review of radiographic studies, pulse oximetry and re-evaluation of patient's condition.  1:40 PM:  I have discussed the diagnosis/risks/treatment options with the patient and believe the pt to be eligible for discharge home to follow-up with PCP. We also discussed returning to the ED immediately if new or worsening sx occur. We discussed the sx which are most concerning (e.g., sudden worsening pain, fever, inability to  tolerate by mouth) that necessitate immediate return. Medications administered to the patient during their visit and any new prescriptions provided to the patient are listed below.  Medications given during this visit Medications  sodium chloride 0.9 % bolus 1,000 mL (0 mLs Intravenous Stopped 02/01/18 1117)  iopamidol (ISOVUE-370) 76 % injection 100 mL (100 mLs Intravenous Contrast Given 02/01/18 1138)    Labs reviewed no noted anemia, troponin elevated but only at the bottom of the detectable range.  Metabolic panel unchanged from prior. Images reviewed cxr with no focal infiltrate no enlarged heart size DDX PE, ACS, pericarditis, viral syndrome, anxiety Old records reviewed patient had a recent ED visit with multiple EKGs but no laboratory evaluation.  The patient appears reasonably screen and/or stabilized for discharge and I doubt any other medical condition or other [facility] requiring further screening, evaluation, or  treatment in the ED at this time prior to discharge.   Final Clinical Impressions(s) / ED Diagnoses   Final diagnoses:  Elevated troponin  Tachycardia  Atypical chest pain    ED Discharge Orders    None       Melene Plan, DO 02/01/18 1340

## 2018-02-01 NOTE — Discharge Instructions (Signed)
Follow up tomorrow with your PCP

## 2018-04-05 ENCOUNTER — Other Ambulatory Visit: Payer: Self-pay

## 2018-04-05 ENCOUNTER — Emergency Department (HOSPITAL_BASED_OUTPATIENT_CLINIC_OR_DEPARTMENT_OTHER)
Admission: EM | Admit: 2018-04-05 | Discharge: 2018-04-05 | Disposition: A | Discharge status: Home / Self Care | Payer: Managed Care, Other (non HMO) | Attending: Emergency Medicine | Admitting: Emergency Medicine

## 2018-04-05 ENCOUNTER — Encounter (HOSPITAL_BASED_OUTPATIENT_CLINIC_OR_DEPARTMENT_OTHER): Payer: Self-pay | Admitting: *Deleted

## 2018-04-05 DIAGNOSIS — F17210 Nicotine dependence, cigarettes, uncomplicated: Secondary | ICD-10-CM | POA: Insufficient documentation

## 2018-04-05 DIAGNOSIS — R112 Nausea with vomiting, unspecified: Secondary | ICD-10-CM | POA: Insufficient documentation

## 2018-04-05 DIAGNOSIS — Z760 Encounter for issue of repeat prescription: Secondary | ICD-10-CM | POA: Insufficient documentation

## 2018-04-05 DIAGNOSIS — E860 Dehydration: Secondary | ICD-10-CM

## 2018-04-05 DIAGNOSIS — I10 Essential (primary) hypertension: Secondary | ICD-10-CM | POA: Insufficient documentation

## 2018-04-05 DIAGNOSIS — R7989 Other specified abnormal findings of blood chemistry: Secondary | ICD-10-CM

## 2018-04-05 DIAGNOSIS — Z79899 Other long term (current) drug therapy: Secondary | ICD-10-CM | POA: Insufficient documentation

## 2018-04-05 DIAGNOSIS — J45909 Unspecified asthma, uncomplicated: Secondary | ICD-10-CM | POA: Insufficient documentation

## 2018-04-05 LAB — COMPREHENSIVE METABOLIC PANEL
ALT: 33 U/L (ref 17–63)
AST: 40 U/L (ref 15–41)
Albumin: 3.9 g/dL (ref 3.5–5.0)
Alkaline Phosphatase: 65 U/L (ref 38–126)
Anion gap: 11 (ref 5–15)
BUN: 21 mg/dL — ABNORMAL HIGH (ref 6–20)
CO2: 26 mmol/L (ref 22–32)
Calcium: 8.7 mg/dL — ABNORMAL LOW (ref 8.9–10.3)
Chloride: 103 mmol/L (ref 101–111)
Creatinine, Ser: 1.34 mg/dL — ABNORMAL HIGH (ref 0.61–1.24)
GFR calc Af Amer: >60 mL/min (ref >60)
GFR calc non Af Amer: >60 mL/min (ref >60)
Glucose, Bld: 89 mg/dL (ref 65–99)
Potassium: 4 mmol/L (ref 3.5–5.1)
Sodium: 140 mmol/L (ref 135–145)
Total Bilirubin: 0.8 mg/dL (ref 0.3–1.2)
Total Protein: 6.9 g/dL (ref 6.5–8.1)

## 2018-04-05 LAB — URINALYSIS, MICROSCOPIC (REFLEX)

## 2018-04-05 LAB — CBC WITH DIFFERENTIAL/PLATELET
Basophils Absolute: 0 10*3/uL (ref 0.0–0.1)
Basophils Relative: 1 %
Eosinophils Absolute: 0 10*3/uL (ref 0.0–0.7)
Eosinophils Relative: 1 %
HCT: 41.1 % (ref 39.0–52.0)
Hemoglobin: 15.2 g/dL (ref 13.0–17.0)
Lymphocytes Relative: 24 %
Lymphs Abs: 1.5 10*3/uL (ref 0.7–4.0)
MCH: 29.4 pg (ref 26.0–34.0)
MCHC: 37 g/dL — ABNORMAL HIGH (ref 30.0–36.0)
MCV: 79.5 fL (ref 78.0–100.0)
Monocytes Absolute: 0.5 10*3/uL (ref 0.1–1.0)
Monocytes Relative: 8 %
Neutro Abs: 4.3 10*3/uL (ref 1.7–7.7)
Neutrophils Relative %: 66 %
Platelets: 224 10*3/uL (ref 150–400)
RBC: 5.17 MIL/uL (ref 4.22–5.81)
RDW: 13.3 % (ref 11.5–15.5)
WBC: 6.5 10*3/uL (ref 4.0–10.5)

## 2018-04-05 LAB — URINALYSIS, ROUTINE W REFLEX MICROSCOPIC
Bilirubin Urine: NEGATIVE
Glucose, UA: NEGATIVE mg/dL
Ketones, ur: NEGATIVE mg/dL
Leukocytes, UA: NEGATIVE
Nitrite: NEGATIVE
Protein, ur: NEGATIVE mg/dL
Specific Gravity, Urine: 1.02 (ref 1.005–1.030)
pH: 6 (ref 5.0–8.0)

## 2018-04-05 LAB — LIPASE, BLOOD: Lipase: 32 U/L (ref 11–51)

## 2018-04-05 MED ORDER — ONDANSETRON 4 MG PO TBDP: ORAL_TABLET | ORAL | 0 refills | Status: DC

## 2018-04-05 MED ORDER — AMLODIPINE BESYLATE 10 MG PO TABS: 10.0000 mg | ORAL_TABLET | Freq: Every day | ORAL | 0 refills | Status: DC

## 2018-04-05 MED ORDER — SODIUM CHLORIDE 0.9 % IV BOLUS
1000.0000 mL | Freq: Once | INTRAVENOUS | Status: AC
  Administered 2018-04-05: 1000 mL via INTRAVENOUS

## 2018-04-05 MED ORDER — FAMOTIDINE 20 MG PO TABS: 20.0000 mg | ORAL_TABLET | Freq: Two times a day (BID) | ORAL | 0 refills | Status: DC

## 2018-04-05 MED ORDER — SODIUM CHLORIDE 0.9 % IV BOLUS
1000.0000 mL | Freq: Once | INTRAVENOUS | Status: AC
Start: 2018-04-05 — End: 2018-04-05
  Administered 2018-04-05: 1000 mL via INTRAVENOUS

## 2018-04-05 MED ORDER — AMLODIPINE BESYLATE 5 MG PO TABS
10.0000 mg | ORAL_TABLET | Freq: Once | ORAL | Status: AC
  Administered 2018-04-05: 10 mg via ORAL
  Filled 2018-04-05: qty 2

## 2018-04-05 MED ORDER — ONDANSETRON HCL 4 MG/2ML IJ SOLN
4.0000 mg | Freq: Once | INTRAMUSCULAR | Status: AC
  Administered 2018-04-05: 4 mg via INTRAVENOUS
  Filled 2018-04-05: qty 2

## 2018-04-05 MED ORDER — FAMOTIDINE IN NACL 20-0.9 MG/50ML-% IV SOLN
20.0000 mg | Freq: Once | INTRAVENOUS | Status: AC
  Administered 2018-04-05: 20 mg via INTRAVENOUS
  Filled 2018-04-05: qty 50

## 2018-04-05 MED FILL — FAMOTIDINE 20 MG TABLET: 20 | 15 days supply | Qty: 30 | Fill #0

## 2018-04-05 MED FILL — ONDANSETRON ODT 4 MG TABLET: 4 | 2 days supply | Qty: 10 | Fill #0

## 2018-04-05 MED FILL — AMLODIPINE BESYLATE 10 MG T: 10 | 30 days supply | Qty: 30 | Fill #0

## 2018-04-05 NOTE — Discharge Instructions (Addendum)
Your lab work overall looks very good.  I think your symptoms are likely caused by a viral GI bug.  He may continue to use Zofran as needed for nausea, you may take Pepcid twice daily 30 to 60 minutes before breakfast and dinner to help with any stomach irritation.  Your kidney function was slightly elevated today in the setting of dehydration, please follow-up with your primary care doctor in 1 week to have this rechecked.  Return to the emergency department for any fevers or chills, abdominal pain, persistent vomiting and unable to keep down fluids despite nausea medication, blood in the stool, or any other new or concerning symptoms.  Your blood pressure was elevated today, please follow up with your primary doctor in 1 week for blood pressure recheck.

## 2018-04-05 NOTE — ED Notes (Signed)
Urine sample collect by RN Sue Lush

## 2018-04-05 NOTE — ED Provider Notes (Signed)
[facility] EMERGENCY DEPARTMENT Provider Note   CSN: [id number] Arrival date & time: 04/05/18  1334     History   Chief Complaint Chief Complaint  Patient presents with  . Emesis    HPI Mario Beck is a 37 y.o. male.  Mario Beck is a 36 y.o. Male with a history of asthma and hypertension, presents to the emergency department for evaluation of nausea and vomiting for the past 2 days.  He reports symptoms started yesterday when he first woke up and immediately felt nauseated and threw up, emesis was nonbloody and nonbilious, he reports since then he has had several more episodes of vomiting has had very poor appetite.  Patient denies any associated abdominal pain.  He had one loose bowel movement but is since then not had a bowel movement, he is unsure if he has been passing any gas, reports he really has not had anything to eat until this afternoon when he forced himself to eat a slim jim and some chicken noodle soup which he has been able to keep down.  Patient denies any melena or hematochezia.  He denies any fevers or chills, no chest pain or shortness of breath, no cough rhinorrhea or congestion.  He denies any dysuria or frequency.  He is unsure if he ate anything out of the ordinary the only thing he can think of is about 4 days ago he ate from a food truck at a celebration over the weekend.  No one else in his home is experiencing similar symptoms.  He also notes that this morning he noticed some bug bites over his abdomen, but reports he thinks this is likely from him working outside yesterday, they are not itchy or painful, and were not there when his symptoms began.  No history of prior abdominal surgeries.  Patient typically takes 10 mg of amlodipine daily for blood pressure, blood pressure of 160/109 here in the emergency department, reports he has been out of his blood pressure medication for 2 weeks. Denies any chest pain, shortness of breath, vision changes, headaches  or dizziness.     Past Medical History:  Diagnosis Date  . Asthma   . Hypertension     There are no active problems to display for this patient.   History reviewed. No pertinent surgical history.      Home Medications    Prior to Admission medications   Medication Sig Start Date End Date Taking? Authorizing Provider  amLODipine (NORVASC) 10 MG tablet Take 1 tablet (10 mg total) by mouth daily. 01/23/18   Street, [place], PA-C  ibuprofen (ADVIL,MOTRIN) 600 MG tablet Take 1 tablet (600 mg total) by mouth every 6 (six) hours as needed. 10/12/15   Lyndal Pulley, MD  lisinopril (PRINIVIL,ZESTRIL) 10 MG tablet Take 10 mg by mouth daily.    [provider]  meclizine (ANTIVERT) 25 MG tablet Take 1 tablet (25 mg total) by mouth 3 (three) times daily as needed. 11/15/16   Melene Plan, DO  Multiple Vitamins-Minerals (MULTIVITAMIN WITH MINERALS) tablet Take 1 tablet by mouth daily.    [provider]    Family History History reviewed. No pertinent family history.  Social History Social History   Tobacco Use  . Smoking status: Current Every Day Smoker    Packs/day: 1.00  . Smokeless tobacco: Never Used  Substance Use Topics  . Alcohol use: Yes    Comment: occ  . Drug use: No     Allergies   Lisinopril  and Shellfish allergy   Review of Systems Review of Systems  Constitutional: Negative for chills and fever.  HENT: Negative for congestion, rhinorrhea and sore throat.   Eyes: Negative for visual disturbance.  Respiratory: Negative for cough and shortness of breath.   Cardiovascular: Negative for chest pain.  Gastrointestinal: Positive for diarrhea, nausea and vomiting. Negative for abdominal pain, blood in stool and constipation.  Genitourinary: Negative for dysuria and frequency.  Musculoskeletal: Negative for arthralgias and myalgias.  Skin: Negative for color change and rash.       Insect bites  Neurological: Negative for dizziness, syncope,  light-headedness and headaches.     Physical Exam Updated Vital Signs BP (!) 160/109   Pulse (!) 116   Temp 98.8 F (37.1 C) (Oral)   Resp 16   Ht 6\' 2"  (1.88 m)   Wt 83.9 kg (185 lb)   SpO2 99%   BMI 23.75 kg/m   Physical Exam  Constitutional: He is oriented to person, place, and time. He appears well-developed and well-nourished. No distress.  HENT:  Head: Normocephalic and atraumatic.  Mouth/Throat: Oropharynx is clear and moist.  Eyes: Right eye exhibits no discharge. Left eye exhibits no discharge.  Neck: Neck supple.  Cardiovascular: Regular rhythm, normal heart sounds and intact distal pulses.  Mild tachycardia  Pulmonary/Chest: Effort normal and breath sounds normal. No stridor. No respiratory distress. He has no wheezes. He has no rales.  Respirations equal and unlabored, patient able to speak in full sentences, lungs clear to auscultation bilaterally  Abdominal: Soft. Bowel sounds are normal. He exhibits no distension and no mass. There is no tenderness. There is no guarding.  Abdomen soft, nondistended, nontender to palpation in all quadrants without guarding or peritoneal signs, no CVA tenderness bilaterally  Musculoskeletal: He exhibits no edema or deformity.  Neurological: He is alert and oriented to person, place, and time. Coordination normal.  Skin: Skin is warm and dry. Capillary refill takes less than 2 seconds. He is not diaphoretic.  Psychiatric: He has a normal mood and affect. His behavior is normal.  Nursing note and vitals reviewed.    ED Treatments / Results  Labs (all labs ordered are listed, but only abnormal results are displayed) Labs Reviewed  URINALYSIS, ROUTINE W REFLEX MICROSCOPIC - Abnormal; Notable for the following components:      Result Value   Hgb urine dipstick SMALL (*)    All other components within normal limits  CBC WITH DIFFERENTIAL/PLATELET - Abnormal; Notable for the following components:   MCHC 37.0 (*)    All other  components within normal limits  COMPREHENSIVE METABOLIC PANEL - Abnormal; Notable for the following components:   BUN 21 (*)    Creatinine, Ser 1.34 (*)    Calcium 8.7 (*)    All other components within normal limits  URINALYSIS, MICROSCOPIC (REFLEX) - Abnormal; Notable for the following components:   Bacteria, UA RARE (*)    All other components within normal limits  LIPASE, BLOOD    EKG None  Radiology No results found.  Procedures Procedures (including critical care time)  Medications Ordered in ED Medications  sodium chloride 0.9 % bolus 1,000 mL (1,000 mLs Intravenous New Bag/Given 04/05/18 1444)  ondansetron (ZOFRAN) injection 4 mg (has no administration in time range)  famotidine (PEPCID) IVPB 20 mg premix (has no administration in time range)  sodium chloride 0.9 % bolus 1,000 mL (has no administration in time range)     Initial Impression / Assessment and Plan /  ED Course  I have reviewed the triage vital signs and the nursing notes.  Pertinent labs & imaging results that were available during my care of the patient were reviewed by me and considered in my medical decision making (see chart for details).  Patient presents to the emergency department for evaluation of 2 days of nausea and vomiting, no associated abdominal pain, patient did have one loose nonbloody bowel movement, no fevers or chills, no urinary symptoms, no cough, chest pain or shortness of breath.  Patient is overall well-appearing, tachycardic to 116 and hypertensive, but afebrile with normal respirations.  Abdominal exam is completely benign, lungs are clear.  Patient has a few red bug bites over the left side of the abdomen, these appear most consistent with mosquito bites, patient was working outdoors yesterday, no surrounding erythema or induration and no expressible drainage, no petechiae.  I think this is likely on the lung related to patient's nausea and vomiting.  Will check abdominal labs, treat  with fluids, Zofran and Pepcid.  Do not feel that abdominal imaging is needed at this time given completely benign exam. I think this is likely a viral GI bug and should improve on its own, seems symptoms have already improved today compared to yesterday and he was able to keep down a small amount of food just prior to arrival.  Abs overall very reassuring, no leukocytosis, normal hemoglobin, no acute electrolyte derangements, creatinine is elevated at 1.34, 2 months ago creatinine was 0.75 I think this is likely in the setting of dehydration his BUN is slightly elevated as well, will give 2 L IV fluid bolus.  Normal LFTs and lipase.  Urinalysis without signs of infection.    Successful PO challenge, and pt feeling better. BP improved. Pt stable for discharge and outpatient follow-up with his primary care doctor for a recheck, she will need his kidney function rechecked within 1 week to ensure creatinine has returned to normal.  Will prescribe Zofran and Pepcid.   Return precautions discussed with the patient.  He expresses understanding and is in agreement with plan.  Pt's blood pressure was elevated today, pt has hx of HTN, not taking his amlodipine as he ran out about 2 weeks ago, will give dose of blood pressure medication today and provide refill prescription,, pt is not exhibiting any symptoms to suggest hypertensive urgency or emergency today, will have pt follow up with their PCP in 1 week for blood pressure check. Discussed long term consequences of untreated hypertension with the patient.   Final Clinical Impressions(s) / ED Diagnoses   Final diagnoses:  Non-intractable vomiting with nausea, unspecified vomiting type  Dehydration  Elevated serum creatinine  Hypertension, unspecified type    ED Discharge Orders        Ordered    ondansetron (ZOFRAN ODT) 4 MG disintegrating tablet     04/05/18 1536    amLODipine (NORVASC) 10 MG tablet  Daily     04/05/18 1536    famotidine (PEPCID) 20  MG tablet  2 times daily     04/05/18 1537       Jodi Geralds [place], [place] 04/05/18 1657    Mesner, Barbara Cower, MD 04/06/18 1516

## 2018-04-05 NOTE — ED Triage Notes (Signed)
Pt c/o n/v/d x 2 days 

## 2018-07-17 ENCOUNTER — Emergency Department (HOSPITAL_BASED_OUTPATIENT_CLINIC_OR_DEPARTMENT_OTHER)
Admission: EM | Admit: 2018-07-17 | Discharge: 2018-07-17 | Discharge status: Against Medical Advice | Payer: 59 | Attending: Emergency Medicine | Admitting: Emergency Medicine

## 2018-07-17 ENCOUNTER — Encounter (HOSPITAL_BASED_OUTPATIENT_CLINIC_OR_DEPARTMENT_OTHER): Payer: Self-pay | Admitting: Emergency Medicine

## 2018-07-17 ENCOUNTER — Other Ambulatory Visit: Payer: Self-pay

## 2018-07-17 DIAGNOSIS — F329 Major depressive disorder, single episode, unspecified: Secondary | ICD-10-CM | POA: Insufficient documentation

## 2018-07-17 DIAGNOSIS — J45909 Unspecified asthma, uncomplicated: Secondary | ICD-10-CM | POA: Insufficient documentation

## 2018-07-17 DIAGNOSIS — F1020 Alcohol dependence, uncomplicated: Secondary | ICD-10-CM | POA: Diagnosis present

## 2018-07-17 DIAGNOSIS — R45851 Suicidal ideations: Secondary | ICD-10-CM | POA: Diagnosis not present

## 2018-07-17 DIAGNOSIS — I10 Essential (primary) hypertension: Secondary | ICD-10-CM | POA: Insufficient documentation

## 2018-07-17 DIAGNOSIS — Z79899 Other long term (current) drug therapy: Secondary | ICD-10-CM | POA: Insufficient documentation

## 2018-07-17 DIAGNOSIS — F32A Depression, unspecified: Secondary | ICD-10-CM

## 2018-07-17 DIAGNOSIS — F1010 Alcohol abuse, uncomplicated: Secondary | ICD-10-CM | POA: Insufficient documentation

## 2018-07-17 DIAGNOSIS — F17210 Nicotine dependence, cigarettes, uncomplicated: Secondary | ICD-10-CM | POA: Diagnosis not present

## 2018-07-17 HISTORY — DX: Alcohol abuse, uncomplicated: F10.10

## 2018-07-17 MED ORDER — LORAZEPAM 2 MG/ML IJ SOLN: 0.0000 mg | Freq: Two times a day (BID) | INTRAMUSCULAR | Status: DC

## 2018-07-17 MED ORDER — VITAMIN B-1 100 MG PO TABS: 100.0000 mg | ORAL_TABLET | Freq: Every day | ORAL | Status: DC

## 2018-07-17 MED ORDER — LORAZEPAM 1 MG PO TABS: 0.0000 mg | ORAL_TABLET | Freq: Two times a day (BID) | ORAL | Status: DC

## 2018-07-17 MED ORDER — LORAZEPAM 1 MG PO TABS: 0.0000 mg | ORAL_TABLET | Freq: Four times a day (QID) | ORAL | Status: DC

## 2018-07-17 MED ORDER — LORAZEPAM 2 MG/ML IJ SOLN: 0.0000 mg | Freq: Four times a day (QID) | INTRAMUSCULAR | Status: DC

## 2018-07-17 MED ORDER — THIAMINE HCL 100 MG/ML IJ SOLN: 100.0000 mg | Freq: Every day | INTRAMUSCULAR | Status: DC

## 2018-07-17 NOTE — ED Triage Notes (Signed)
Pt reports drinking a pint of liquor daily. Last drink last night at 1900.

## 2018-07-17 NOTE — ED Notes (Signed)
While room was being prepared and paper scrubs being obtained pt left with wife. Dr. Elesa Massed made aware. No further action needed.

## 2018-07-17 NOTE — ED Triage Notes (Signed)
Pt is wanting in patient detox. Pt has a bed in an out patient detox oct 7 but is looking for medical detox until then.

## 2018-07-17 NOTE — ED Notes (Signed)
Pt displayed no signs of DTs.

## 2018-07-17 NOTE — ED Notes (Signed)
Pt endorses SI to Dr. Elesa Massed. SI precautions initiated.

## 2018-07-17 NOTE — ED Provider Notes (Signed)
TIME SEEN: 6:45 AM  CHIEF COMPLAINT: Alcohol abuse, suicidal thoughts  HPI: Patient is a 37 year old male with history of alcohol abuse who drinks liquor and beer every day who presents to the emergency department requesting alcohol detox.  States he is also having suicidal thoughts with no plan.  No HI or hallucinations.  Last drink was last night.  Has history of shakes but no seizures or hallucinations with withdrawal.  States he has outpatient rehab scheduled for October 7.  Was just at [facility] yesterday requesting detox.  Reports he has had chills, sweats, vomiting, diarrhea.  Patient's wife reports that he has felt hopeless.  ROS: See HPI Constitutional: no fever  Eyes: no drainage  ENT: no runny nose   Cardiovascular:  no chest pain  Resp: no SOB  GI: Vomiting, diarrhea GU: no dysuria Integumentary: no rash  Allergy: no hives  Musculoskeletal: no leg swelling  Neurological: no slurred speech ROS otherwise negative  PAST MEDICAL HISTORY/PAST SURGICAL HISTORY:  Past Medical History:  Diagnosis Date  . Alcohol abuse   . Asthma   . Hypertension     MEDICATIONS:  Prior to Admission medications   Medication Sig Start Date End Date Taking? Authorizing Provider  amLODipine (NORVASC) 10 MG tablet Take 1 tablet (10 mg total) by mouth daily. 04/05/18  Yes Dartha Lodge, PA-C  famotidine (PEPCID) 20 MG tablet Take 1 tablet (20 mg total) by mouth 2 (two) times daily. 04/05/18  Yes Dartha Lodge, PA-C    ALLERGIES:  Allergies  Allergen Reactions  . Lisinopril Swelling    Angioedema   . Shellfish Allergy     SOCIAL HISTORY:  Social History   Tobacco Use  . Smoking status: Current Every Day Smoker    Packs/day: 1.00  . Smokeless tobacco: Never Used  Substance Use Topics  . Alcohol use: Yes    Comment: 1 pint daily    FAMILY HISTORY: No family history on file.  EXAM: BP (!) 157/107   Pulse (!) 114   Temp 98.2 F (36.8 C) (Oral)   Resp 18   Ht 6\' 2"  (1.88 m)    Wt 81.6 kg   SpO2 97%   BMI 23.11 kg/m  CONSTITUTIONAL: Alert and oriented and responds appropriately to questions. Well-appearing; well-nourished HEAD: Normocephalic EYES: Conjunctivae clear, pupils appear equal, EOMI ENT: normal nose; moist mucous membranes NECK: Supple, no meningismus, no nuchal rigidity, no LAD  CARD: Regular and tachycardic; S1 and S2 appreciated; no murmurs, no clicks, no rubs, no gallops RESP: Normal chest excursion without splinting or tachypnea; breath sounds clear and equal bilaterally; no wheezes, no rhonchi, no rales, no hypoxia or respiratory distress, speaking full sentences ABD/GI: Normal bowel sounds; non-distended; soft, non-tender, no rebound, no guarding, no peritoneal signs, no hepatosplenomegaly BACK:  The back appears normal and is non-tender to palpation, there is no CVA tenderness EXT: Normal ROM in all joints; non-tender to palpation; no edema; normal capillary refill; no cyanosis, no calf tenderness or swelling    SKIN: Normal color for age and race; warm; no rash NEURO: Moves all extremities equally PSYCH: Reports suicidal ideation without plan.  No HI or hallucinations.  Patient seems very agitated and angry.  Grooming and personal hygiene are appropriate.  MEDICAL DECISION MAKING: Patient here requesting alcohol detox.  He also endorses suicidal ideation.  I believe that patient may be endorsing suicidal thoughts because he has been informed that we do not provide alcohol detox from the hospital.  He states  he needs to go to behavioral health hospital.  We discussed outpatient options but he says he does not feel this is appropriate.  He has no plan for his suicidal thoughts.  Will obtain screening labs, urine.  Will consult TTS for further recommendations.  His labs yesterday at [facility] were unremarkable other than alcohol level of 206.  At this time I feel he is medically cleared.  ED PROGRESS: Patient's wife has been overheard talking on the  phone stating "it's sad that he has to say he is suicidal just to get help".  6:54 AM  I was informed by nursing staff that patient and wife walked out of the emergency department.  I do not feel that we need to have security go after them.  I do not feel that patient needs to be all involuntary committed.  I feel that he was only endorsing suicidal ideation because he wanted to have inpatient alcohol detox.  He did not have a plan.  I do not feel patient is a danger to himself or others.   I was not able to discuss with him about leaving AGAINST MEDICAL ADVICE.  Patient and wife walked out of the emergency department without informing staff.   I reviewed all nursing notes, vitals, pertinent previous records, EKGs, lab and urine results, imaging (as available).    Durward Matranga, Layla Maw, DO 07/17/18 [phone]

## 2019-07-18 ENCOUNTER — Encounter (HOSPITAL_COMMUNITY): Payer: Self-pay

## 2019-07-18 ENCOUNTER — Other Ambulatory Visit: Payer: Self-pay

## 2019-07-18 ENCOUNTER — Emergency Department (HOSPITAL_COMMUNITY)
Admission: EM | Admit: 2019-07-18 | Discharge: 2019-07-18 | Disposition: A | Discharge status: Home / Self Care | Payer: 59 | Attending: Emergency Medicine | Admitting: Emergency Medicine

## 2019-07-18 DIAGNOSIS — I10 Essential (primary) hypertension: Secondary | ICD-10-CM | POA: Diagnosis not present

## 2019-07-18 DIAGNOSIS — K921 Melena: Secondary | ICD-10-CM | POA: Diagnosis present

## 2019-07-18 DIAGNOSIS — R110 Nausea: Secondary | ICD-10-CM | POA: Insufficient documentation

## 2019-07-18 DIAGNOSIS — K920 Hematemesis: Secondary | ICD-10-CM | POA: Diagnosis not present

## 2019-07-18 DIAGNOSIS — Z79899 Other long term (current) drug therapy: Secondary | ICD-10-CM | POA: Insufficient documentation

## 2019-07-18 DIAGNOSIS — R5383 Other fatigue: Secondary | ICD-10-CM | POA: Diagnosis not present

## 2019-07-18 DIAGNOSIS — F17210 Nicotine dependence, cigarettes, uncomplicated: Secondary | ICD-10-CM | POA: Insufficient documentation

## 2019-07-18 LAB — COMPREHENSIVE METABOLIC PANEL
ALT: 43 U/L (ref 0–44)
AST: 32 U/L (ref 15–41)
Albumin: 4.3 g/dL (ref 3.5–5.0)
Alkaline Phosphatase: 66 U/L (ref 38–126)
Anion gap: 11 (ref 5–15)
BUN: 10 mg/dL (ref 6–20)
CO2: 24 mmol/L (ref 22–32)
Calcium: 9.4 mg/dL (ref 8.9–10.3)
Chloride: 102 mmol/L (ref 98–111)
Creatinine, Ser: 0.91 mg/dL (ref 0.61–1.24)
GFR calc Af Amer: >60 mL/min (ref >60)
GFR calc non Af Amer: >60 mL/min (ref >60)
Glucose, Bld: 110 mg/dL — ABNORMAL HIGH (ref 70–99)
Potassium: 4.1 mmol/L (ref 3.5–5.1)
Sodium: 137 mmol/L (ref 135–145)
Total Bilirubin: 0.7 mg/dL (ref 0.3–1.2)
Total Protein: 7.7 g/dL (ref 6.5–8.1)

## 2019-07-18 LAB — CBC
HCT: 43.4 % (ref 39.0–52.0)
Hemoglobin: 14.8 g/dL (ref 13.0–17.0)
MCH: 27.5 pg (ref 26.0–34.0)
MCHC: 34.1 g/dL (ref 30.0–36.0)
MCV: 80.5 fL (ref 80.0–100.0)
Platelets: 209 10*3/uL (ref 150–400)
RBC: 5.39 MIL/uL (ref 4.22–5.81)
RDW: 14.3 % (ref 11.5–15.5)
WBC: 8.5 10*3/uL (ref 4.0–10.5)
nRBC: 0.2 % (ref 0.0–0.2)

## 2019-07-18 LAB — POC OCCULT BLOOD, ED: Fecal Occult Bld: NEGATIVE

## 2019-07-18 LAB — TYPE AND SCREEN
ABO/RH(D): O POS
Antibody Screen: NEGATIVE

## 2019-07-18 LAB — ABO/RH: ABO/RH(D): O POS

## 2019-07-18 LAB — LIPASE, BLOOD: Lipase: 31 U/L (ref 11–51)

## 2019-07-18 MED ORDER — ONDANSETRON 4 MG PO TBDP: ORAL_TABLET | ORAL | 0 refills | Status: DC

## 2019-07-18 MED ORDER — HALOPERIDOL 5 MG PO TABS
5.00 | ORAL_TABLET | ORAL | Status: DC
Start: ? — End: 2019-07-18

## 2019-07-18 MED ORDER — CETIRIZINE HCL 10 MG PO TABS
10.00 | ORAL_TABLET | ORAL | Status: DC
Start: ? — End: 2019-07-18

## 2019-07-18 MED ORDER — ALUMINUM-MAGNESIUM-SIMETHICONE 200-200-20 MG/5ML PO SUSP
30.00 | ORAL | Status: DC
Start: ? — End: 2019-07-18

## 2019-07-18 MED ORDER — LORAZEPAM 1 MG PO TABS
2.00 | ORAL_TABLET | ORAL | Status: DC
Start: ? — End: 2019-07-18

## 2019-07-18 MED ORDER — ACETAMINOPHEN 325 MG PO TABS
650.00 | ORAL_TABLET | ORAL | Status: DC
Start: ? — End: 2019-07-18

## 2019-07-18 MED ORDER — NICOTINE POLACRILEX 2 MG MT GUM
2.00 | CHEWING_GUM | OROMUCOSAL | Status: DC
Start: ? — End: 2019-07-18

## 2019-07-18 MED ORDER — DIPHENHYDRAMINE HCL 50 MG/ML IJ SOLN
50.00 | INTRAMUSCULAR | Status: DC
Start: ? — End: 2019-07-18

## 2019-07-18 MED ORDER — SALINE NASAL SPRAY 0.65 % NA SOLN
1.00 | NASAL | Status: DC
Start: ? — End: 2019-07-18

## 2019-07-18 MED ORDER — CLONIDINE HCL 0.1 MG PO TABS
0.10 | ORAL_TABLET | ORAL | Status: DC
Start: ? — End: 2019-07-18

## 2019-07-18 MED ORDER — TRAZODONE HCL 50 MG PO TABS
50.00 | ORAL_TABLET | ORAL | Status: DC
Start: ? — End: 2019-07-18

## 2019-07-18 MED ORDER — ESOMEPRAZOLE MAGNESIUM 40 MG PO CPDR: 40.0000 mg | DELAYED_RELEASE_CAPSULE | Freq: Two times a day (BID) | ORAL | 0 refills | Status: DC

## 2019-07-18 MED ORDER — LORAZEPAM 2 MG/ML IJ SOLN
2.00 | INTRAMUSCULAR | Status: DC
Start: ? — End: 2019-07-18

## 2019-07-18 MED ORDER — PANTOPRAZOLE SODIUM 40 MG IV SOLR
40.0000 mg | Freq: Once | INTRAVENOUS | Status: AC
  Administered 2019-07-18: 40 mg via INTRAVENOUS
  Filled 2019-07-18: qty 40

## 2019-07-18 MED ORDER — DIPHENHYDRAMINE HCL 25 MG PO CAPS
50.00 | ORAL_CAPSULE | ORAL | Status: DC
Start: ? — End: 2019-07-18

## 2019-07-18 MED ORDER — QUINTABS PO TABS
1.00 | ORAL_TABLET | ORAL | Status: DC
Start: 2019-07-17 — End: 2019-07-18

## 2019-07-18 MED ORDER — BENZOCAINE-MENTHOL 6-10 MG MT LOZG
1.00 | LOZENGE | OROMUCOSAL | Status: DC
Start: ? — End: 2019-07-18

## 2019-07-18 MED ORDER — GENERIC EXTERNAL MEDICATION
5.00 | Status: DC
Start: ? — End: 2019-07-18

## 2019-07-18 MED ORDER — GUAIFENESIN-DM 100-10 MG/5ML PO SYRP
10.00 | ORAL_SOLUTION | ORAL | Status: DC
Start: ? — End: 2019-07-18

## 2019-07-18 MED ORDER — NICOTINE 21 MG/24HR TD PT24
1.00 | MEDICATED_PATCH | TRANSDERMAL | Status: DC
Start: 2019-07-17 — End: 2019-07-18

## 2019-07-18 MED ORDER — ONDANSETRON 8 MG PO TBDP
8.00 | ORAL_TABLET | ORAL | Status: DC
Start: ? — End: 2019-07-18

## 2019-07-18 MED ORDER — ONDANSETRON HCL 4 MG/2ML IJ SOLN
4.0000 mg | Freq: Once | INTRAMUSCULAR | Status: AC
  Administered 2019-07-18: 4 mg via INTRAVENOUS
  Filled 2019-07-18: qty 2

## 2019-07-18 MED ORDER — SODIUM CHLORIDE 0.9 % IV BOLUS
1000.0000 mL | Freq: Once | INTRAVENOUS | Status: AC
  Administered 2019-07-18: 1000 mL via INTRAVENOUS

## 2019-07-18 MED ORDER — GENERIC EXTERNAL MEDICATION
2.00 | Status: DC
Start: ? — End: 2019-07-18

## 2019-07-18 MED ORDER — AMLODIPINE BESYLATE 5 MG PO TABS
10.00 | ORAL_TABLET | ORAL | Status: DC
Start: 2019-07-17 — End: 2019-07-18

## 2019-07-18 MED ORDER — BISACODYL 5 MG PO TBEC
10.00 | DELAYED_RELEASE_TABLET | ORAL | Status: DC
Start: ? — End: 2019-07-18

## 2019-07-18 MED ORDER — TRAZODONE HCL 100 MG PO TABS
100.00 | ORAL_TABLET | ORAL | Status: DC
Start: 2019-07-16 — End: 2019-07-18

## 2019-07-18 MED ORDER — SERTRALINE HCL 50 MG PO TABS
50.00 | ORAL_TABLET | ORAL | Status: DC
Start: 2019-07-17 — End: 2019-07-18

## 2019-07-18 MED ORDER — LORAZEPAM 1 MG PO TABS
1.00 | ORAL_TABLET | ORAL | Status: DC
Start: 2019-07-16 — End: 2019-07-18

## 2019-07-18 MED ORDER — HYDROXYZINE HCL 25 MG PO TABS
50.00 | ORAL_TABLET | ORAL | Status: DC
Start: ? — End: 2019-07-18

## 2019-07-18 MED ORDER — LOPERAMIDE HCL 2 MG PO CAPS
2.00 | ORAL_CAPSULE | ORAL | Status: DC
Start: ? — End: 2019-07-18

## 2019-07-18 NOTE — ED Triage Notes (Addendum)
Pt arrives POV for eval of abd pain, N/V/D, today noted blood in his stools and coffee ground emesis. States he is unable to keep anything down x 3 days. States he recently had neg covid test. States fatigue, weakness

## 2019-07-18 NOTE — Discharge Instructions (Addendum)
Your work-up today has been reassuring.  Despite reported bleeding your hemoglobin and lab work looks good.  I have scheduled you an appointment with Dr. Ardis Hughs with low our GI for 07/22/2019 at 9:30 AM.  Call the office if you have any questions about this appointment.  Take prescribed esomeprazole (acid blocker) twice daily before breakfast and dinnertime to help reduce stomach irritation and potential bleeding.  You may use Zofran as needed for nausea.  Return to the emergency department if you have additional episodes of coffee-ground emesis or bright red blood in your vomit, or any additional blood in your stools or dark black stools, severe abdominal pain or any other new or concerning symptoms.

## 2019-07-18 NOTE — ED Provider Notes (Signed)
[facility] Provider Note   CSN: [id number] Arrival date & time: 07/18/19  1208     History   Chief Complaint Chief Complaint  Patient presents with  . Abdominal Pain  . Emesis  . Diarrhea    HPI Alben Jepsen is a 38 y.o. male.     Tameron Lama is a 38 y.o. male with history of asthma, hypertension and alcohol abuse, who presents to the ED for evaluation of coffee grounds emesis and blood in his stools.  Patient reports that 3 days ago he started experiencing intermittent nausea, diarrhea and feeling fatigued, he denies any focal abdominal pain with this, no fevers.  Today he had an episode of emesis for the first time after eating, and reports this looked like coffee grounds, no bright red blood.  He also reports having intermittent dark stools, with most notable episode today prior to arrival.  Since coming to the ED he has had no additional episodes of coffee-ground emesis or bloody stools.  He has not noted any bright red blood in his stool.  He denies any abdominal pain.  He reports feeling fatigued but he has not had any lightheadedness or syncope.  No chest pain or shortness of breath.  No palpitations.  No dysuria or urinary frequency, no blood in his urine.  He denies any previous history of GI bleeding.  Denies recent heavy NSAID use.  Does report that he is a daily drinker, and typically has a few beers or cocktails daily after work, did have a beer today prior to arrival.  He denies any other drug use.  Is not on any blood thinners.  No other aggravating or alleviating factors.  He has never had an EGD or colonoscopy before.     Past Medical History:  Diagnosis Date  . Alcohol abuse   . Asthma   . Hypertension     There are no active problems to display for this patient.   History reviewed. No pertinent surgical history.      Home Medications    Prior to Admission medications   Medication Sig Start Date End Date Taking?  Authorizing Provider  amLODipine (NORVASC) 10 MG tablet Take 1 tablet (10 mg total) by mouth daily. 04/05/18   Jacqlyn Larsen, PA-C  esomeprazole (NEXIUM) 40 MG capsule Take 1 capsule (40 mg total) by mouth 2 (two) times daily before a meal. 07/18/19   Jacqlyn Larsen, PA-C  famotidine (PEPCID) 20 MG tablet Take 1 tablet (20 mg total) by mouth 2 (two) times daily. 04/05/18   Jacqlyn Larsen, PA-C  ondansetron (ZOFRAN ODT) 4 MG disintegrating tablet 4mg  ODT q4 hours prn nausea/vomit 07/18/19   Jacqlyn Larsen, PA-C    Family History History reviewed. No pertinent family history.  Social History Social History   Tobacco Use  . Smoking status: Current Every Day Smoker    Packs/day: 1.00  . Smokeless tobacco: Never Used  Substance Use Topics  . Alcohol use: Yes    Comment: 1 pint daily  . Drug use: No     Allergies   Lisinopril and Shellfish allergy   Review of Systems Review of Systems  Constitutional: Positive for fatigue. Negative for chills and fever.  HENT: Negative.   Respiratory: Negative for cough and shortness of breath.   Cardiovascular: Negative for chest pain and palpitations.  Gastrointestinal: Positive for blood in stool, diarrhea, nausea and vomiting. Negative for abdominal pain.  Genitourinary: Negative for dysuria,  frequency and hematuria.  Musculoskeletal: Negative for arthralgias and myalgias.  Skin: Negative for color change and rash.  Neurological: Negative for dizziness, syncope and light-headedness.  All other systems reviewed and are negative.    Physical Exam Updated Vital Signs BP 139/87   Pulse (!) 117   Temp 98.8 F (37.1 C) (Oral)   Resp 16   Ht 6\' 3"  (1.905 m)   Wt 88.5 kg   SpO2 100%   BMI 24.37 kg/m   Physical Exam Vitals signs and nursing note reviewed.  Constitutional:      General: He is not in acute distress.    Appearance: He is well-developed and normal weight. He is not diaphoretic.     Comments: Well-appearing and in no acute  distress.  HENT:     Head: Normocephalic and atraumatic.     Mouth/Throat:     Mouth: Mucous membranes are moist.     Pharynx: Oropharynx is clear.  Eyes:     General:        Right eye: No discharge.        Left eye: No discharge.     Pupils: Pupils are equal, round, and reactive to light.  Neck:     Musculoskeletal: Neck supple.  Cardiovascular:     Rate and Rhythm: Normal rate and regular rhythm.     Heart sounds: Normal heart sounds. No murmur. No friction rub. No gallop.   Pulmonary:     Effort: Pulmonary effort is normal. No respiratory distress.     Breath sounds: Normal breath sounds. No wheezing or rales.     Comments: Respirations equal and unlabored, patient able to speak in full sentences, lungs clear to auscultation bilaterally Abdominal:     General: Abdomen is flat. Bowel sounds are normal. There is no distension.     Palpations: Abdomen is soft. There is no mass.     Tenderness: There is no abdominal tenderness. There is no guarding.     Comments: Abdomen is soft, nondistended, bowel sounds present throughout, there is no focal abdominal tenderness, no guarding or peritoneal signs.  Genitourinary:    Comments: Chaperone present during rectal exam. Normal rectal tone, no hemorrhoids noted, no frank blood, no stool obtained on rectal exam. Musculoskeletal:        General: No deformity.  Skin:    General: Skin is warm and dry.     Capillary Refill: Capillary refill takes less than 2 seconds.  Neurological:     Mental Status: He is alert.     Coordination: Coordination normal.     Comments: Speech is clear, able to follow commands Moves extremities without ataxia, coordination intact  Psychiatric:        Mood and Affect: Mood normal.        Behavior: Behavior normal.      ED Treatments / Results  Labs (all labs ordered are listed, but only abnormal results are displayed) Labs Reviewed  COMPREHENSIVE METABOLIC PANEL - Abnormal; Notable for the following  components:      Result Value   Glucose, Bld 110 (*)    All other components within normal limits  CBC  LIPASE, BLOOD  POC OCCULT BLOOD, ED  TYPE AND SCREEN  ABO/RH    EKG None  Radiology No results found.  Procedures Procedures (including critical care time)  Medications Ordered in ED Medications  sodium chloride 0.9 % bolus 1,000 mL (1,000 mLs Intravenous New Bag/Given 07/18/19 1537)  pantoprazole (PROTONIX) injection 40 mg (  40 mg Intravenous Given 07/18/19 1538)  ondansetron (ZOFRAN) injection 4 mg (4 mg Intravenous Given 07/18/19 1538)     Initial Impression / Assessment and Plan / ED Course  I have reviewed the triage vital signs and the nursing notes.  Pertinent labs & imaging results that were available during my care of the patient were reviewed by me and considered in my medical decision making (see chart for details).  38 year old male presents with 1 episode of reported coffee-ground emesis and blood in stool after feeling nauseated with 3 days of intermittent diarrhea.  No prior history of GI bleeding.  No focal abdominal pain on exam.  Denies NSAID use but is a daily drinker.  Patient reports fatigue but has not had any syncopal episodes, negative orthostatics, tachycardic on arrival but this has resolved even without intervention.  Suspect due to poor appetite patient may be mildly dehydrated.  Will check abdominal labs, Hemoccult, type and screen.  Will give IV fluids and Protonix.  Presentation concerning for possible upper GI bleed, potentially from ulcer or alcoholic gastritis, patient has not had any bright red blood, and does not have history of cirrhosis or liver dysfunction, variceal bleed less likely.  No stool on rectal exam, and Hemoccult negative.  Patient's hemoglobin is normal at 14.8, no leukocytosis, normal electrolytes, normal renal and liver function and normal lipase.  Given reassuring lab work and abdominal exam and no additional episodes of  coffee-ground emesis or bloody stools feel patient can likely be discharged but will discuss with GI.  Case discussed with PA Amy Esterwood with [facility], who agrees that patient can follow-up outpatient, she has gotten the patient an appointment for Monday 10/5 at 9:30 AM with Dr. Christella Hartigan I discussed this with the patient who expresses understanding and agreement with this plan.  He is feeling much better after IV fluids and has eaten a full meal here in the ED with no further emesis.  Will discharge home on PPI, Zofran for nausea.  Strict return precautions discussed, patient to return for any additional bleeding or worsening symptoms.  Vitals:   07/18/19 1212 07/18/19 1537  BP: 139/87 134/85  Pulse: (!) 117 93  Resp: 16 16  Temp: 98.8 F (37.1 C)   SpO2: 100% 100%     Final Clinical Impressions(s) / ED Diagnoses   Final diagnoses:  Blood in stool  Coffee ground emesis  Nausea  Fatigue, unspecified type    ED Discharge Orders         Ordered    esomeprazole (NEXIUM) 40 MG capsule  2 times daily before meals     07/18/19 1632    ondansetron (ZOFRAN ODT) 4 MG disintegrating tablet     07/18/19 1632           Dartha Lodge, PA-C 07/19/19 1347    Benjiman Core, MD 07/26/19 [phone]

## 2019-07-22 ENCOUNTER — Ambulatory Visit (INDEPENDENT_AMBULATORY_CARE_PROVIDER_SITE_OTHER): Payer: 59 | Admitting: Gastroenterology

## 2019-07-22 ENCOUNTER — Encounter: Payer: Self-pay | Admitting: Gastroenterology

## 2019-07-22 VITALS — BP 118/80 | HR 91 | Temp 97.9°F | Ht 75.0 in | Wt 200.1 lb

## 2019-07-22 DIAGNOSIS — R110 Nausea: Secondary | ICD-10-CM

## 2019-07-22 DIAGNOSIS — K920 Hematemesis: Secondary | ICD-10-CM

## 2019-07-22 NOTE — Progress Notes (Signed)
HPI: This is a very pleasant 38 year old man whom I am meeting for the first time today.  He was referred from the emergency room.  He has had loss of appetite, diarrhea and vomiting for about a week now.  No significant abdominal pains.  No sick contacts, no travel out of country, no oral antibiotics in the past several months.  He tells me that his stool diarrhea was very dark on occasion.  He also feels like he vomited some very dark coffee-ground material.  He has been forcing himself to stay hydrated.  He went to the emergency room 3 to 4 days ago.  See those results summarized below.  His weight has been overall stable.  He has had no significant abdominal pains.  Prior to this acute illness he really never had troubles with his bowels or stomach.  He feels he is definitely getting better in the past 2 or 3 days since he was in the emergency room.  He is not back to normal.  Chief complaint is acute vomiting diarrheal illness  Colon cancer does not run in his family   Old Data Reviewed: He was in the emergency room 3 or 4 days ago for evaluation of "coffee-ground emesis and blood in his stools".  lab testing July 18, 2019 show completely normal CBC, complete metabolic profile, Hemoccult negative stool.     Review of systems: Pertinent positive and negative review of systems were noted in the above HPI section. All other review negative.   Past Medical History:  Diagnosis Date  . Alcohol abuse   . Asthma   . Hypertension     Past Surgical History:  Procedure Laterality Date  . FRACTURE SURGERY     Left leg as a kid    Current Outpatient Medications  Medication Sig Dispense Refill  . amLODipine (NORVASC) 10 MG tablet Take 1 tablet (10 mg total) by mouth daily. 30 tablet 0  . esomeprazole (NEXIUM) 40 MG capsule Take 1 capsule (40 mg total) by mouth 2 (two) times daily before a meal. 30 capsule 0  . ondansetron (ZOFRAN ODT) 4 MG disintegrating tablet 4mg  ODT q4  hours prn nausea/vomit 10 tablet 0  . sertraline (ZOLOFT) 25 MG tablet 1 tablet daily.    . traZODone (DESYREL) 100 MG tablet Take 1 tablet by mouth at bedtime.     No current facility-administered medications for this visit.     Allergies as of 07/22/2019 - Review Complete 07/22/2019  Allergen Reaction Noted  . Lisinopril Swelling 02/01/2018  . Shellfish allergy  10/12/2015    Family History  Problem Relation Age of Onset  . Colon cancer Neg Hx   . Esophageal cancer Neg Hx     Social History   Socioeconomic History  . Marital status: Married    Spouse name: Not on file  . Number of children: 2  . Years of education: Not on file  . Highest education level: Not on file  Occupational History  . Not on file  Social Needs  . Financial resource strain: Not on file  . Food insecurity    Worry: Not on file    Inability: Not on file  . Transportation needs    Medical: Not on file    Non-medical: Not on file  Tobacco Use  . Smoking status: Current Every Day Smoker    Packs/day: 1.00  . Smokeless tobacco: Current User  . Tobacco comment: Does pouches as welll   Substance and Sexual  Activity  . Alcohol use: Yes    Comment: 1 pint daily  . Drug use: No  . Sexual activity: Yes  Lifestyle  . Physical activity    Days per week: Not on file    Minutes per session: Not on file  . Stress: Not on file  Relationships  . Social Musician on phone: Not on file    Gets together: Not on file    Attends religious service: Not on file    Active member of club or organization: Not on file    Attends meetings of clubs or organizations: Not on file    Relationship status: Not on file  . Intimate partner violence    Fear of current or ex partner: Not on file    Emotionally abused: Not on file    Physically abused: Not on file    Forced sexual activity: Not on file  Other Topics Concern  . Not on file  Social History Narrative  . Not on file     Physical Exam: BP  118/80   Pulse 91   Temp 97.9 F (36.6 C)   Ht 6\' 3"  (1.905 m)   Wt 200 lb 2 oz (90.8 kg)   BMI 25.01 kg/m  Constitutional: generally well-appearing Psychiatric: alert and oriented x3 Eyes: extraocular movements intact Mouth: oral pharynx moist, no lesions Neck: supple no lymphadenopathy Cardiovascular: heart regular rate and rhythm Lungs: clear to auscultation bilaterally Abdomen: soft, nontender, nondistended, no obvious ascites, no peritoneal signs, normal bowel sounds Extremities: no lower extremity edema bilaterally Skin: no lesions on visible extremities   Assessment and plan: 38 y.o. male with acute vomiting, diarrheal illness  I explained to him that this is most likely infectious.  His stools have already gotten more solid and so I think it would be quite low yield to send him for a GI pathogen panel.  His blood work was all essentially normal 4 days ago and he is feeling better since he was in the ER.  He has not yet back to normal but I explained to him that it can take several more days after what I presume to be an infectious illness like this.  He will continue to try to stay hydrated, will force gentle calories back into his diet such as applesauce, fruit, toast.  He will call our office in 7 days to report on how he is doing.  Really back to normal he will probably need invasive testing such as upper endoscopy and possibly colonoscopy.    Please see the "Patient Instructions" section for addition details about the plan.   33, MD [facility] 07/22/2019, 9:44 AM

## 2019-07-22 NOTE — Patient Instructions (Addendum)
   If you are age 36 or younger, your body mass index should be between 19-25. Your Body mass index is 25.01 kg/m. If this is out of the aformentioned range listed, please consider follow up with your Primary Care Provider.   Please call office in 1 week to inform us of how your feeling.   Thank you for choosing me and [facility].  Dr. Ardis Hughs

## 2019-07-25 ENCOUNTER — Telehealth: Payer: Self-pay | Admitting: Gastroenterology

## 2019-07-25 NOTE — Telephone Encounter (Signed)
Pt called to update Dr. Ardis Hughs on his progress. Pt states that is sxs have not improved much, he still has nausea, he is not vomiting any more but is still nauseated and would like to continue taking medication for that. Pls call him.

## 2019-07-25 NOTE — Telephone Encounter (Signed)
Pt states he is still having issues with pain in his stomach, nausea and diarrhea. He is taking nexium and zofran. Reports really no change. Please advise.

## 2019-07-26 ENCOUNTER — Other Ambulatory Visit: Payer: Self-pay

## 2019-07-26 DIAGNOSIS — R110 Nausea: Secondary | ICD-10-CM

## 2019-07-26 NOTE — Telephone Encounter (Signed)
Ok, at the time of his OV he was improving.  Sounds like that has changed and he is having issues again.  Let's get a GI pathogen panel, if negative then colo/EGD likely.  thanks

## 2019-07-26 NOTE — Telephone Encounter (Signed)
Left message for pt to call back  °

## 2019-08-01 NOTE — Telephone Encounter (Signed)
Left message for pt to call back  °

## 2019-08-06 NOTE — Telephone Encounter (Signed)
Left message for pt to call back. Letter mailed to pt.  

## 2019-10-04 ENCOUNTER — Encounter (HOSPITAL_BASED_OUTPATIENT_CLINIC_OR_DEPARTMENT_OTHER): Payer: Self-pay | Admitting: Emergency Medicine

## 2019-10-04 ENCOUNTER — Emergency Department (HOSPITAL_BASED_OUTPATIENT_CLINIC_OR_DEPARTMENT_OTHER)
Admission: EM | Admit: 2019-10-04 | Discharge: 2019-10-04 | Disposition: A | Discharge status: Home / Self Care | Payer: 59 | Attending: Emergency Medicine | Admitting: Emergency Medicine

## 2019-10-04 ENCOUNTER — Other Ambulatory Visit: Payer: Self-pay

## 2019-10-04 DIAGNOSIS — A0839 Other viral enteritis: Secondary | ICD-10-CM | POA: Diagnosis not present

## 2019-10-04 DIAGNOSIS — Z79899 Other long term (current) drug therapy: Secondary | ICD-10-CM | POA: Diagnosis not present

## 2019-10-04 DIAGNOSIS — U071 COVID-19: Secondary | ICD-10-CM | POA: Diagnosis not present

## 2019-10-04 DIAGNOSIS — F17290 Nicotine dependence, other tobacco product, uncomplicated: Secondary | ICD-10-CM | POA: Diagnosis not present

## 2019-10-04 DIAGNOSIS — J45909 Unspecified asthma, uncomplicated: Secondary | ICD-10-CM | POA: Insufficient documentation

## 2019-10-04 DIAGNOSIS — I10 Essential (primary) hypertension: Secondary | ICD-10-CM | POA: Insufficient documentation

## 2019-10-04 DIAGNOSIS — R1110 Vomiting, unspecified: Secondary | ICD-10-CM | POA: Diagnosis present

## 2019-10-04 LAB — CBC WITH DIFFERENTIAL/PLATELET
Abs Immature Granulocytes: 0.02 10*3/uL (ref 0.00–0.07)
Basophils Absolute: 0 10*3/uL (ref 0.0–0.1)
Basophils Relative: 0 %
Eosinophils Absolute: 0 10*3/uL (ref 0.0–0.5)
Eosinophils Relative: 1 %
HCT: 42.4 % (ref 39.0–52.0)
Hemoglobin: 14.4 g/dL (ref 13.0–17.0)
Immature Granulocytes: 0 %
Lymphocytes Relative: 27 %
Lymphs Abs: 1.4 10*3/uL (ref 0.7–4.0)
MCH: 26.7 pg (ref 26.0–34.0)
MCHC: 34 g/dL (ref 30.0–36.0)
MCV: 78.7 fL — ABNORMAL LOW (ref 80.0–100.0)
Monocytes Absolute: 0.4 10*3/uL (ref 0.1–1.0)
Monocytes Relative: 7 %
Neutro Abs: 3.4 10*3/uL (ref 1.7–7.7)
Neutrophils Relative %: 65 %
Platelets: 198 10*3/uL (ref 150–400)
RBC: 5.39 MIL/uL (ref 4.22–5.81)
RDW: 14.7 % (ref 11.5–15.5)
WBC: 5.3 10*3/uL (ref 4.0–10.5)
nRBC: 0 % (ref 0.0–0.2)

## 2019-10-04 LAB — COMPREHENSIVE METABOLIC PANEL
ALT: 29 U/L (ref 0–44)
AST: 29 U/L (ref 15–41)
Albumin: 4 g/dL (ref 3.5–5.0)
Alkaline Phosphatase: 68 U/L (ref 38–126)
Anion gap: 12 (ref 5–15)
BUN: 13 mg/dL (ref 6–20)
CO2: 23 mmol/L (ref 22–32)
Calcium: 8.6 mg/dL — ABNORMAL LOW (ref 8.9–10.3)
Chloride: 103 mmol/L (ref 98–111)
Creatinine, Ser: 0.92 mg/dL (ref 0.61–1.24)
GFR calc Af Amer: >60 mL/min (ref >60)
GFR calc non Af Amer: >60 mL/min (ref >60)
Glucose, Bld: 93 mg/dL (ref 70–99)
Potassium: 4 mmol/L (ref 3.5–5.1)
Sodium: 138 mmol/L (ref 135–145)
Total Bilirubin: 0.8 mg/dL (ref 0.3–1.2)
Total Protein: 7.2 g/dL (ref 6.5–8.1)

## 2019-10-04 LAB — LIPASE, BLOOD: Lipase: 27 U/L (ref 11–51)

## 2019-10-04 MED ORDER — LOPERAMIDE HCL 2 MG PO CAPS
4.0000 mg | ORAL_CAPSULE | Freq: Once | ORAL | Status: AC
  Administered 2019-10-04: 15:00:00 4 mg via ORAL
  Filled 2019-10-04: qty 2

## 2019-10-04 MED ORDER — SODIUM CHLORIDE 0.9 % IV BOLUS
1000.0000 mL | Freq: Once | INTRAVENOUS | Status: AC
  Administered 2019-10-04: 1000 mL via INTRAVENOUS

## 2019-10-04 MED ORDER — DICYCLOMINE HCL 20 MG PO TABS: 20.0000 mg | ORAL_TABLET | Freq: Two times a day (BID) | ORAL | 0 refills | Status: DC

## 2019-10-04 MED ORDER — ONDANSETRON HCL 4 MG/2ML IJ SOLN
4.0000 mg | Freq: Once | INTRAMUSCULAR | Status: AC
  Administered 2019-10-04: 4 mg via INTRAVENOUS
  Filled 2019-10-04: qty 2

## 2019-10-04 MED ORDER — ONDANSETRON HCL 4 MG PO TABS: 4.0000 mg | ORAL_TABLET | Freq: Three times a day (TID) | ORAL | 0 refills | Status: DC | PRN

## 2019-10-04 MED ORDER — DICYCLOMINE HCL 10 MG PO CAPS
10.0000 mg | ORAL_CAPSULE | Freq: Once | ORAL | Status: AC
  Administered 2019-10-04: 10 mg via ORAL
  Filled 2019-10-04: qty 1

## 2019-10-04 MED FILL — ONDANSETRON HCL 4 MG TABLET: 4 | 3 days supply | Qty: 10 | Fill #0

## 2019-10-04 MED FILL — DICYCLOMINE 20 MG TABLET: 20 | 10 days supply | Qty: 20 | Fill #0

## 2019-10-04 NOTE — ED Provider Notes (Signed)
[facility] Provider Note   CSN: [id number] Arrival date & time: 10/04/19  1135     History Chief Complaint  Patient presents with  . Emesis  . Diarrhea    Mario Beck is a 38 y.o. male.  Who presents for nausea vomiting and diarrhea.  Patient states that he was diagnosed with coronavirus on 09/26/2019 he complains of persistent nausea vomiting and diarrhea.  He states that every time he tries to eat food he throws it up.  He has been self treating with Nexium and Gatorade without much relief.  He has not been running fevers.  He has no appetite and feels exhausted.  He has a persistent cough without difficulty breathing  HPI     Past Medical History:  Diagnosis Date  . Alcohol abuse   . Asthma   . Depression   . Hypertension     There are no problems to display for this patient.   Past Surgical History:  Procedure Laterality Date  . FRACTURE SURGERY     Left leg as a kid       Family History  Problem Relation Age of Onset  . Colon cancer Neg Hx   . Esophageal cancer Neg Hx     Social History   Tobacco Use  . Smoking status: Current Every Day Smoker    Packs/day: 1.00  . Smokeless tobacco: Current User  . Tobacco comment: Does pouches as welll   Substance Use Topics  . Alcohol use: Yes    Comment: 1 pint daily  . Drug use: No    Home Medications Prior to Admission medications   Medication Sig Start Date End Date Taking? Authorizing Provider  amLODipine (NORVASC) 10 MG tablet Take 1 tablet (10 mg total) by mouth daily. 04/05/18   Jacqlyn Larsen, PA-C  esomeprazole (NEXIUM) 40 MG capsule Take 1 capsule (40 mg total) by mouth 2 (two) times daily before a meal. 07/18/19   Jacqlyn Larsen, PA-C  ondansetron (ZOFRAN ODT) 4 MG disintegrating tablet 4mg  ODT q4 hours prn nausea/vomit 07/18/19   Jacqlyn Larsen, PA-C  sertraline (ZOLOFT) 25 MG tablet 1 tablet daily. 07/19/19   [provider]  traZODone (DESYREL) 100 MG tablet  Take 1 tablet by mouth at bedtime.    [provider]    Allergies    Lisinopril and Shellfish allergy  Review of Systems   Review of Systems Ten systems reviewed and are negative for acute change, except as noted in the HPI.   Physical Exam Updated Vital Signs BP (!) 137/99   Pulse 97   Temp 98.5 F (36.9 C) (Oral)   Resp 20   Ht 6\' 2"  (1.88 m)   Wt 83.9 kg   SpO2 98%   BMI 23.75 kg/m   Physical Exam Vitals and nursing note reviewed.  Constitutional:      General: He is not in acute distress.    Appearance: He is well-developed. He is not diaphoretic.  HENT:     Head: Normocephalic and atraumatic.  Eyes:     General: No scleral icterus.    Conjunctiva/sclera: Conjunctivae normal.  Cardiovascular:     Rate and Rhythm: Normal rate and regular rhythm.     Heart sounds: Normal heart sounds.  Pulmonary:     Effort: Pulmonary effort is normal. No respiratory distress.     Breath sounds: Stridor: Clear with cough. Rhonchi present. No wheezing.  Abdominal:     Palpations: Abdomen  is soft.     Tenderness: There is no abdominal tenderness.  Musculoskeletal:     Cervical back: Normal range of motion and neck supple.  Skin:    General: Skin is warm and dry.  Neurological:     Mental Status: He is alert.  Psychiatric:        Behavior: Behavior normal.     ED Results / Procedures / Treatments   Labs (all labs ordered are listed, but only abnormal results are displayed) Labs Reviewed - No data to display  EKG None  Radiology No results found.  Procedures Procedures (including critical care time)  Medications Ordered in ED Medications - No data to display  ED Course  I have reviewed the triage vital signs and the nursing notes.  Pertinent labs & imaging results that were available during my care of the patient were reviewed by me and considered in my medical decision making (see chart for details).    MDM Rules/Calculators/A&P                       Rodrigo Mcgranahan has presented with complaint of vomiting. I have reviewed the labs which show microcytosis without anemia of insignificant value, mild hypocalcemia.  No other acute abnormalities noted. The differential diagnosis for Ules Marsala is extensive and thus medical decision making is of high complexity. After review of the data points in the case the patient's presentation is most consistent with COVID-19 induced viral gastroenteritis. The presentation is not consistent with emergent causes such as acute coronary syndrome, acute radiation syndrome, acute gastric dilatation, acetaminophen toxicity, adrenal insufficiency, appendicitis, salicylate toxicity, bowel obstruction or ileus, carbon monoxide poisoning, cholecystitis, digoxin toxicity, CNS tumor elevated intracranial pressures, outlet obstruction or gastric volvulus, incarcerated hernia, hyperemesis gravidarum, pancreatitis, peritonitis, ruptured viscus, testicular or ovarian torsion, or theophylline toxicity.  Similarly the presentation is not consistent with biliary colic, cannabinoid hyperemesis syndrome, chemotherapy reaction, gastroparesis, vertigo, migraine, motion sickness, narcotic withdrawal, peptic ulcer disease, renal colic or urinary tract infection.  Repeat examination of the patient shows a benign abdomen and the patient is tolerating p.o. fluids.   I discussed all of the findings with the patient and I have delivered strict return precautions personally or by detailed written instruction delivered verbally by nursing staff to the patient/caregiver/family member.   Data Reviewed/Counseling: I have reviewed the patient's vital signs, nursing notes, and other relevant tests/information. I had a detailed discussion regarding the historical points, exam findings, and any diagnostic results supporting the discharge diagnosis. I also discussed the need for outpatient follow-up and the need to return to the ED if symptoms worsen or  if there are any questions or concerns that arise at home.  Final Clinical Impression(s) / ED Diagnoses Final diagnoses:  None    Rx / DC Orders ED Discharge Orders    None       Arthor Captain, PA-C 10/04/19 1514    Arby Barrette, MD 10/05/19 1240

## 2019-10-04 NOTE — Discharge Instructions (Signed)
Get help right away if you: Have chest pain. Feel extremely weak or you faint. See blood in your vomit. Have vomit that looks like coffee grounds. Have bloody or black stools or stools that look like tar. Have a severe headache, a stiff neck, or both. Have a rash. Have severe pain, cramping, or bloating in your abdomen. Have trouble breathing or you are breathing very quickly. Have a fast heartbeat. Have skin that feels cold and clammy. Feel confused. Have pain when you urinate. Have signs of dehydration, such as: Dark urine, very little urine, or no urine. Cracked lips. Dry mouth. Sunken eyes. Sleepiness. Weakness.

## 2019-10-04 NOTE — ED Triage Notes (Signed)
Pt tested positive on 09-26-19.  Pt has been having a lot of N/V/D since then.  Very small stools.  3 emesis in 24.  Unknown fevers.  Pt states he feels really tired.

## 2019-11-26 ENCOUNTER — Other Ambulatory Visit: Payer: Self-pay

## 2019-11-26 ENCOUNTER — Encounter (HOSPITAL_BASED_OUTPATIENT_CLINIC_OR_DEPARTMENT_OTHER): Payer: Self-pay | Admitting: *Deleted

## 2019-11-26 ENCOUNTER — Emergency Department (HOSPITAL_BASED_OUTPATIENT_CLINIC_OR_DEPARTMENT_OTHER)
Admission: EM | Admit: 2019-11-26 | Discharge: 2019-11-26 | Disposition: A | Discharge status: Home / Self Care | Payer: 59 | Attending: Emergency Medicine | Admitting: Emergency Medicine

## 2019-11-26 DIAGNOSIS — I10 Essential (primary) hypertension: Secondary | ICD-10-CM | POA: Insufficient documentation

## 2019-11-26 DIAGNOSIS — Z20822 Contact with and (suspected) exposure to covid-19: Secondary | ICD-10-CM

## 2019-11-26 DIAGNOSIS — J45909 Unspecified asthma, uncomplicated: Secondary | ICD-10-CM | POA: Insufficient documentation

## 2019-11-26 DIAGNOSIS — F17210 Nicotine dependence, cigarettes, uncomplicated: Secondary | ICD-10-CM | POA: Insufficient documentation

## 2019-11-26 DIAGNOSIS — Z760 Encounter for issue of repeat prescription: Secondary | ICD-10-CM

## 2019-11-26 DIAGNOSIS — Z79899 Other long term (current) drug therapy: Secondary | ICD-10-CM | POA: Insufficient documentation

## 2019-11-26 DIAGNOSIS — K2900 Acute gastritis without bleeding: Secondary | ICD-10-CM

## 2019-11-26 LAB — CBC WITH DIFFERENTIAL/PLATELET
Abs Immature Granulocytes: 0.03 10*3/uL (ref 0.00–0.07)
Basophils Absolute: 0 10*3/uL (ref 0.0–0.1)
Basophils Relative: 1 %
Eosinophils Absolute: 0.1 10*3/uL (ref 0.0–0.5)
Eosinophils Relative: 1 %
HCT: 39.4 % (ref 39.0–52.0)
Hemoglobin: 13.7 g/dL (ref 13.0–17.0)
Immature Granulocytes: 0 %
Lymphocytes Relative: 14 %
Lymphs Abs: 1.1 10*3/uL (ref 0.7–4.0)
MCH: 27.3 pg (ref 26.0–34.0)
MCHC: 34.8 g/dL (ref 30.0–36.0)
MCV: 78.5 fL — ABNORMAL LOW (ref 80.0–100.0)
Monocytes Absolute: 0.6 10*3/uL (ref 0.1–1.0)
Monocytes Relative: 8 %
Neutro Abs: 6.2 10*3/uL (ref 1.7–7.7)
Neutrophils Relative %: 76 %
Platelets: 263 10*3/uL (ref 150–400)
RBC: 5.02 MIL/uL (ref 4.22–5.81)
RDW: 14.9 % (ref 11.5–15.5)
WBC: 8 10*3/uL (ref 4.0–10.5)
nRBC: 0 % (ref 0.0–0.2)

## 2019-11-26 LAB — COMPREHENSIVE METABOLIC PANEL
ALT: 29 U/L (ref 0–44)
AST: 36 U/L (ref 15–41)
Albumin: 4.1 g/dL (ref 3.5–5.0)
Alkaline Phosphatase: 61 U/L (ref 38–126)
Anion gap: 11 (ref 5–15)
BUN: 16 mg/dL (ref 6–20)
CO2: 23 mmol/L (ref 22–32)
Calcium: 9 mg/dL (ref 8.9–10.3)
Chloride: 102 mmol/L (ref 98–111)
Creatinine, Ser: 0.86 mg/dL (ref 0.61–1.24)
GFR calc Af Amer: >60 mL/min (ref >60)
GFR calc non Af Amer: >60 mL/min (ref >60)
Glucose, Bld: 102 mg/dL — ABNORMAL HIGH (ref 70–99)
Potassium: 3.5 mmol/L (ref 3.5–5.1)
Sodium: 136 mmol/L (ref 135–145)
Total Bilirubin: 0.7 mg/dL (ref 0.3–1.2)
Total Protein: 7.3 g/dL (ref 6.5–8.1)

## 2019-11-26 LAB — LIPASE, BLOOD: Lipase: 65 U/L — ABNORMAL HIGH (ref 11–51)

## 2019-11-26 MED ORDER — ONDANSETRON HCL 4 MG/2ML IJ SOLN
4.0000 mg | Freq: Once | INTRAMUSCULAR | Status: AC
  Administered 2019-11-26: 4 mg via INTRAVENOUS
  Filled 2019-11-26: qty 2

## 2019-11-26 MED ORDER — AMLODIPINE BESYLATE 10 MG PO TABS: 10.0000 mg | ORAL_TABLET | Freq: Every day | ORAL | 0 refills | Status: DC

## 2019-11-26 MED ORDER — DICYCLOMINE HCL 10 MG PO CAPS: 10.0000 mg | ORAL_CAPSULE | Freq: Once | ORAL | Status: AC

## 2019-11-26 MED ORDER — DICYCLOMINE HCL 10 MG PO CAPS
ORAL_CAPSULE | ORAL | Status: AC
  Administered 2019-11-26: 13:00:00 10 mg via ORAL
  Filled 2019-11-26: qty 1

## 2019-11-26 MED ORDER — DICYCLOMINE HCL 20 MG PO TABS: 20.0000 mg | ORAL_TABLET | Freq: Two times a day (BID) | ORAL | 0 refills | Status: DC

## 2019-11-26 MED ORDER — DICYCLOMINE HCL 10 MG/ML IM SOLN
10.0000 mg | Freq: Once | INTRAMUSCULAR | Status: DC
  Filled 2019-11-26: qty 2

## 2019-11-26 MED ORDER — SODIUM CHLORIDE 0.9 % IV BOLUS
1000.0000 mL | Freq: Once | INTRAVENOUS | Status: AC
  Administered 2019-11-26: 12:00:00 1000 mL via INTRAVENOUS

## 2019-11-26 MED ORDER — MIRTAZAPINE 15 MG PO TABS: 15.0000 mg | ORAL_TABLET | Freq: Every day | ORAL | 0 refills | Status: DC

## 2019-11-26 MED ORDER — ONDANSETRON 4 MG PO TBDP: 4.0000 mg | ORAL_TABLET | Freq: Three times a day (TID) | ORAL | 0 refills | Status: DC | PRN

## 2019-11-26 MED ORDER — METOCLOPRAMIDE HCL 5 MG/ML IJ SOLN
5.0000 mg | Freq: Once | INTRAMUSCULAR | Status: AC
  Administered 2019-11-26: 5 mg via INTRAVENOUS
  Filled 2019-11-26: qty 2

## 2019-11-26 MED FILL — AMLODIPINE BESYLATE 10 MG T: 10 | 30 days supply | Qty: 30 | Fill #0

## 2019-11-26 MED FILL — MIRTAZAPINE 15 MG TABLET: 15 | 30 days supply | Qty: 30 | Fill #0

## 2019-11-26 MED FILL — DICYCLOMINE 20 MG TABLET: 20 | 5 days supply | Qty: 10 | Fill #0

## 2019-11-26 MED FILL — ONDANSETRON ODT 4 MG TABLET: 4 | 3 days supply | Qty: 5 | Fill #0

## 2019-11-26 NOTE — ED Provider Notes (Signed)
[facility] EMERGENCY DEPARTMENT Provider Note   CSN: [id number] Arrival date & time: 11/26/19  1119     History Chief Complaint  Patient presents with  . Abdominal Pain  . Emesis  . Diarrhea    Mario Beck is a 39 y.o. male with a past medical history of alcohol abuse, hypertension presenting to the ED with a chief complaint of abdominal pain.  Reports 2 days ago started having generalized lower abdominal pain, several episodes of nonbloody, nonbilious emesis and diarrhea.  Symptoms began after he ate a burger on 11/24/2019.  He has tried taking Nexium and Pepto-Bismol with minimal improvement in his symptoms.  He does admit to drinking alcohol prior to symptom onset.  He has history of alcohol induced pancreatitis but he is unsure if this feels similar.  Denies any bloody diarrhea or sick contacts with similar symptoms.  He has been urinating normally.  Denies any shortness of breath, cough, fever, chest pain, prior abdominal surgeries.  Reports daily tobacco use and now has occasional alcohol use.  Denies prior abdominal surgeries. He had a negative rapid COVID test yesterday.  HPI     Past Medical History:  Diagnosis Date  . Alcohol abuse   . Asthma   . Depression   . Hypertension     There are no problems to display for this patient.   Past Surgical History:  Procedure Laterality Date  . FRACTURE SURGERY     Left leg as a kid       Family History  Problem Relation Age of Onset  . Colon cancer Neg Hx   . Esophageal cancer Neg Hx     Social History   Tobacco Use  . Smoking status: Current Every Day Smoker    Packs/day: 1.00  . Smokeless tobacco: Current User  . Tobacco comment: Does pouches as welll   Substance Use Topics  . Alcohol use: Yes    Comment: 1 pint daily  . Drug use: No    Home Medications Prior to Admission medications   Medication Sig Start Date End Date Taking? Authorizing Provider  amLODipine (NORVASC) 10 MG tablet Take 1 tablet  (10 mg total) by mouth daily. 11/26/19   Kharter Sestak, PA-C  dicyclomine (BENTYL) 20 MG tablet Take 1 tablet (20 mg total) by mouth 2 (two) times daily. 11/26/19   Markevious Ehmke, PA-C  esomeprazole (NEXIUM) 40 MG capsule Take 1 capsule (40 mg total) by mouth 2 (two) times daily before a meal. 07/18/19   Jacqlyn Larsen, PA-C  gabapentin (NEURONTIN) 300 MG capsule Take 300 mg by mouth 3 (three) times daily. 08/06/19   [provider]  mirtazapine (REMERON) 15 MG tablet Take 1 tablet (15 mg total) by mouth at bedtime. 11/26/19   Ottavio Norem, PA-C  ondansetron (ZOFRAN ODT) 4 MG disintegrating tablet Take 1 tablet (4 mg total) by mouth every 8 (eight) hours as needed for nausea or vomiting. 11/26/19   Percy Comp, PA-C  ondansetron (ZOFRAN) 4 MG tablet Take 1 tablet (4 mg total) by mouth every 8 (eight) hours as needed for nausea or vomiting. 10/04/19   Margarita Mail, PA-C    Allergies    Lisinopril and Shellfish allergy  Review of Systems   Review of Systems  Constitutional: Negative for appetite change, chills and fever.  HENT: Negative for ear pain, rhinorrhea, sneezing and sore throat.   Eyes: Negative for photophobia and visual disturbance.  Respiratory: Negative for cough, chest tightness, shortness of breath  and wheezing.   Cardiovascular: Negative for chest pain and palpitations.  Gastrointestinal: Positive for abdominal pain, diarrhea, nausea and vomiting. Negative for blood in stool and constipation.  Genitourinary: Negative for dysuria, hematuria and urgency.  Musculoskeletal: Negative for myalgias.  Skin: Negative for rash.  Neurological: Negative for dizziness, weakness and light-headedness.    Physical Exam Updated Vital Signs BP (!) 159/102   Pulse 99   Temp 99.2 F (37.3 C) (Oral)   Resp 14   Ht 6\' 3"  (1.905 m)   Wt 92.4 kg   SpO2 99%   BMI 25.47 kg/m   Physical Exam Vitals and nursing note reviewed.  Constitutional:      General: He is not in acute distress.     Appearance: He is well-developed.  HENT:     Head: Normocephalic and atraumatic.     Nose: Nose normal.  Eyes:     General: No scleral icterus.       Left eye: No discharge.     Conjunctiva/sclera: Conjunctivae normal.  Cardiovascular:     Rate and Rhythm: Normal rate and regular rhythm.     Heart sounds: Normal heart sounds. No murmur. No friction rub. No gallop.   Pulmonary:     Effort: Pulmonary effort is normal. No respiratory distress.     Breath sounds: Normal breath sounds.  Abdominal:     General: Bowel sounds are normal. There is no distension.     Palpations: Abdomen is soft.     Tenderness: There is generalized abdominal tenderness. There is no guarding.  Musculoskeletal:        General: Normal range of motion.     Cervical back: Normal range of motion and neck supple.  Skin:    General: Skin is warm and dry.     Findings: No rash.  Neurological:     Mental Status: He is alert.     Motor: No abnormal muscle tone.     Coordination: Coordination normal.     ED Results / Procedures / Treatments   Labs (all labs ordered are listed, but only abnormal results are displayed) Labs Reviewed  COMPREHENSIVE METABOLIC PANEL - Abnormal; Notable for the following components:      Result Value   Glucose, Bld 102 (*)    All other components within normal limits  CBC WITH DIFFERENTIAL/PLATELET - Abnormal; Notable for the following components:   MCV 78.5 (*)    All other components within normal limits  LIPASE, BLOOD - Abnormal; Notable for the following components:   Lipase 65 (*)    All other components within normal limits  NOVEL CORONAVIRUS, NAA (HOSP ORDER, SEND-OUT TO REF LAB; TAT 18-24 HRS)    EKG None  Radiology No results found.  Procedures Procedures (including critical care time)  Medications Ordered in ED Medications  sodium chloride 0.9 % bolus 1,000 mL (1,000 mLs Intravenous New Bag/Given 11/26/19 1207)  metoCLOPramide (REGLAN) injection 5 mg (5 mg  Intravenous Given 11/26/19 1215)  dicyclomine (BENTYL) capsule 10 mg (10 mg Oral Given 11/26/19 1243)  ondansetron (ZOFRAN) injection 4 mg (4 mg Intravenous Given 11/26/19 1258)    ED Course  I have reviewed the triage vital signs and the nursing notes.  Pertinent labs & imaging results that were available during my care of the patient were reviewed by me and considered in my medical decision making (see chart for details).    MDM Rules/Calculators/A&P  Jamieon Lannen was evaluated in Emergency Department on 11/26/19 for the symptoms described in the history of present illness. He/she was evaluated in the context of the global COVID-19 pandemic, which necessitated consideration that the patient might be at risk for infection with the SARS-CoV-2 virus that causes COVID-19. Institutional protocols and algorithms that pertain to the evaluation of patients at risk for COVID-19 are in a state of rapid change based on information released by regulatory bodies including the CDC and federal and state organizations. These policies and algorithms were followed during the patient's care in the ED.  39 year old male with past medical history of alcohol abuse, hypertension presenting to the ED with abdominal pain.  Reports several episodes of nonbloody, nonbilious emesis and diarrhea as well.  Symptoms began 2 days ago.  History of alcohol induced pancreatitis but he is unsure if this feels similar.  On exam abdomen is generally tender without rebound or guarding patient appears overall comfortable.  He does endorse alcohol use 2 days ago and he is unsure if this is the cause of his symptoms today.  Work-up here shows normal CMP and CBC.  Lipase is slightly elevated at 65 although not indicative of acute pancreatitis.  Patient was given IV fluids, antispasmodics and antiemetics with resolution of his symptoms.  He is able to tolerate p.o. intake without difficulty.  I suspect that his symptoms could be  due to to a viral infection.  Doubt appendicitis, cholecystitis or other emergent cause of symptoms.  Some degree of inflammation of the pancreas so given return precautions.  He is requesting a repeat Covid test which I feel is reasonable as his negative test was a rapid point-of-care test.  He is also requesting a refill of his amlodipine and Remeron.  I provided the patient with both of these were educated on following up with PCP for further evaluation and refills.  Patient is hemodynamically stable, in NAD, and able to ambulate in the ED. Evaluation does not show pathology that would require ongoing emergent intervention or inpatient treatment. I explained the diagnosis to the patient. Pain has been managed and has no complaints prior to discharge. Patient is comfortable with above plan and is stable for discharge at this time. All questions were answered prior to disposition. Strict return precautions for returning to the ED were discussed. Encouraged follow up with PCP.   An After Visit Summary was printed and given to the patient.   Portions of this note were generated with Scientist, clinical (histocompatibility and immunogenetics). Dictation errors may occur despite best attempts at proofreading.   Final Clinical Impression(s) / ED Diagnoses Final diagnoses:  Acute gastritis without hemorrhage, unspecified gastritis type  Person under investigation for COVID-19  Encounter for medication refill    Rx / DC Orders ED Discharge Orders         Ordered    mirtazapine (REMERON) 15 MG tablet  Daily at bedtime     11/26/19 1415    amLODipine (NORVASC) 10 MG tablet  Daily     11/26/19 1415    dicyclomine (BENTYL) 20 MG tablet  2 times daily     11/26/19 1415    ondansetron (ZOFRAN ODT) 4 MG disintegrating tablet  Every 8 hours PRN     11/26/19 1415           Dietrich Pates, PA-C 11/26/19 1418    Milagros Loll, MD 11/28/19 0028

## 2019-11-26 NOTE — ED Triage Notes (Addendum)
Abdominal pain, vomiting and diarrhea x 2 days. His rapid Covid test was negative yesterday.

## 2019-11-26 NOTE — Discharge Instructions (Signed)
It is important for you to follow-up with your primary care provider regarding further refills of your medications. We will contact you with results of your Covid test when it is available. Return to the ED if you start to develop worsening abdominal pain, chest pain, shortness of breath, leg swelling.

## 2019-11-27 ENCOUNTER — Emergency Department (HOSPITAL_BASED_OUTPATIENT_CLINIC_OR_DEPARTMENT_OTHER): Payer: Self-pay

## 2019-11-27 ENCOUNTER — Emergency Department (HOSPITAL_BASED_OUTPATIENT_CLINIC_OR_DEPARTMENT_OTHER)
Admission: EM | Admit: 2019-11-27 | Discharge: 2019-11-27 | Disposition: A | Discharge status: Home / Self Care | Payer: Self-pay | Attending: Emergency Medicine | Admitting: Emergency Medicine

## 2019-11-27 ENCOUNTER — Encounter (HOSPITAL_BASED_OUTPATIENT_CLINIC_OR_DEPARTMENT_OTHER): Payer: Self-pay | Admitting: Emergency Medicine

## 2019-11-27 DIAGNOSIS — Z888 Allergy status to other drugs, medicaments and biological substances status: Secondary | ICD-10-CM | POA: Insufficient documentation

## 2019-11-27 DIAGNOSIS — Z79899 Other long term (current) drug therapy: Secondary | ICD-10-CM | POA: Insufficient documentation

## 2019-11-27 DIAGNOSIS — F17210 Nicotine dependence, cigarettes, uncomplicated: Secondary | ICD-10-CM | POA: Insufficient documentation

## 2019-11-27 DIAGNOSIS — I10 Essential (primary) hypertension: Secondary | ICD-10-CM | POA: Insufficient documentation

## 2019-11-27 DIAGNOSIS — J45909 Unspecified asthma, uncomplicated: Secondary | ICD-10-CM | POA: Insufficient documentation

## 2019-11-27 DIAGNOSIS — F1010 Alcohol abuse, uncomplicated: Secondary | ICD-10-CM | POA: Insufficient documentation

## 2019-11-27 DIAGNOSIS — K852 Alcohol induced acute pancreatitis without necrosis or infection: Secondary | ICD-10-CM | POA: Insufficient documentation

## 2019-11-27 DIAGNOSIS — Z91013 Allergy to seafood: Secondary | ICD-10-CM | POA: Insufficient documentation

## 2019-11-27 LAB — CBC WITH DIFFERENTIAL/PLATELET
Abs Immature Granulocytes: 0.02 10*3/uL (ref 0.00–0.07)
Basophils Absolute: 0 10*3/uL (ref 0.0–0.1)
Basophils Relative: 0 %
Eosinophils Absolute: 0.1 10*3/uL (ref 0.0–0.5)
Eosinophils Relative: 1 %
HCT: 39.4 % (ref 39.0–52.0)
Hemoglobin: 13.6 g/dL (ref 13.0–17.0)
Immature Granulocytes: 0 %
Lymphocytes Relative: 12 %
Lymphs Abs: 1.2 10*3/uL (ref 0.7–4.0)
MCH: 27.5 pg (ref 26.0–34.0)
MCHC: 34.5 g/dL (ref 30.0–36.0)
MCV: 79.8 fL — ABNORMAL LOW (ref 80.0–100.0)
Monocytes Absolute: 0.7 10*3/uL (ref 0.1–1.0)
Monocytes Relative: 7 %
Neutro Abs: 7.5 10*3/uL (ref 1.7–7.7)
Neutrophils Relative %: 80 %
Platelets: 258 10*3/uL (ref 150–400)
RBC: 4.94 MIL/uL (ref 4.22–5.81)
RDW: 15.3 % (ref 11.5–15.5)
WBC: 9.5 10*3/uL (ref 4.0–10.5)
nRBC: 0 % (ref 0.0–0.2)

## 2019-11-27 LAB — NOVEL CORONAVIRUS, NAA (HOSP ORDER, SEND-OUT TO REF LAB; TAT 18-24 HRS): SARS-CoV-2, NAA: NOT DETECTED

## 2019-11-27 LAB — LIPASE, BLOOD: Lipase: 176 U/L — ABNORMAL HIGH (ref 11–51)

## 2019-11-27 LAB — COMPREHENSIVE METABOLIC PANEL
ALT: 22 U/L (ref 0–44)
AST: 23 U/L (ref 15–41)
Albumin: 4 g/dL (ref 3.5–5.0)
Alkaline Phosphatase: 60 U/L (ref 38–126)
Anion gap: 10 (ref 5–15)
BUN: 12 mg/dL (ref 6–20)
CO2: 23 mmol/L (ref 22–32)
Calcium: 9.2 mg/dL (ref 8.9–10.3)
Chloride: 105 mmol/L (ref 98–111)
Creatinine, Ser: 1.03 mg/dL (ref 0.61–1.24)
GFR calc Af Amer: >60 mL/min (ref >60)
GFR calc non Af Amer: >60 mL/min (ref >60)
Glucose, Bld: 110 mg/dL — ABNORMAL HIGH (ref 70–99)
Potassium: 3.5 mmol/L (ref 3.5–5.1)
Sodium: 138 mmol/L (ref 135–145)
Total Bilirubin: 0.6 mg/dL (ref 0.3–1.2)
Total Protein: 7.3 g/dL (ref 6.5–8.1)

## 2019-11-27 MED ORDER — ALUM & MAG HYDROXIDE-SIMETH 200-200-20 MG/5ML PO SUSP
30.0000 mL | Freq: Once | ORAL | Status: AC
  Administered 2019-11-27: 30 mL via ORAL
  Filled 2019-11-27: qty 30

## 2019-11-27 MED ORDER — OXYCODONE HCL 5 MG PO TABS
5.0000 mg | ORAL_TABLET | Freq: Once | ORAL | Status: AC
  Administered 2019-11-27: 16:00:00 5 mg via ORAL
  Filled 2019-11-27: qty 1

## 2019-11-27 MED ORDER — HYDROMORPHONE HCL 1 MG/ML IJ SOLN
1.0000 mg | Freq: Once | INTRAMUSCULAR | Status: AC
  Administered 2019-11-27: 16:00:00 1 mg via INTRAVENOUS
  Filled 2019-11-27: qty 1

## 2019-11-27 MED ORDER — LIDOCAINE VISCOUS HCL 2 % MT SOLN
15.0000 mL | Freq: Once | OROMUCOSAL | Status: AC
  Administered 2019-11-27: 15 mL via ORAL
  Filled 2019-11-27: qty 15

## 2019-11-27 MED ORDER — OXYCODONE-ACETAMINOPHEN 5-325 MG PO TABS: 1.0000 | ORAL_TABLET | ORAL | 0 refills | Status: DC | PRN

## 2019-11-27 MED ORDER — SODIUM CHLORIDE 0.9 % IV BOLUS
1000.0000 mL | Freq: Once | INTRAVENOUS | Status: AC
  Administered 2019-11-27: 1000 mL via INTRAVENOUS

## 2019-11-27 MED ORDER — HYDROMORPHONE HCL 1 MG/ML IJ SOLN
1.0000 mg | Freq: Once | INTRAMUSCULAR | Status: AC
  Administered 2019-11-27: 1 mg via INTRAVENOUS
  Filled 2019-11-27: qty 1

## 2019-11-27 NOTE — ED Provider Notes (Signed)
Care assumed from Pricilla Loveless, MD.  At time of transfer care, patient is awaiting reassessment after further pain medication and monitoring after he was discovered to have recurrent pancreatitis today.  The previous team did not feel that imaging was needed at this time given history of recent imaging however they do feel comfortable giving the patient prescriptions of medications when he is stable for discharge home.    If patient is able to have his pain under control and is able to tolerate p.o., anticipate discharge home with prescription for nausea medication and several doses of pain medication.  6:41 PM After further medications, he was feeling better.  Patient given prescription for Percocet and Zofran and will be discharged home.  He understands return precautions and follow-up instructions.  Patient discharged in good condition with diagnosis of recurrent pancreatitis.   Clinical Impression: 1. Alcohol-induced acute pancreatitis, unspecified complication status     Disposition: Discharge  Condition: Good  I have discussed the results, Dx and Tx plan with the pt(& family if present). He/she/they expressed understanding and agree(s) with the plan. Discharge instructions discussed at great length. Strict return precautions discussed and pt &/or family have verbalized understanding of the instructions. No further questions at time of discharge.    New Prescriptions   OXYCODONE-ACETAMINOPHEN (PERCOCET/ROXICET) 5-325 MG TABLET    Take 1 tablet by mouth every 4 (four) hours as needed.    Follow Up: [facility] [address] [place] [address] [phone] Schedule an appointment as soon as possible for a visit    [facility] [place] [facility] DEPARTMENT [address] [id number] [address] [place] [address] [phone]       Kohle Winner, Canary Brim, MD 11/27/19 [phone]

## 2019-11-27 NOTE — Discharge Instructions (Signed)
Your work-up today was consistent with acute pancreatitis causing your discomfort.  Please use the pain medicine and nausea medicine and rest.  Please stay hydrated.  Please follow-up with your primary doctor.  If any symptoms change or worsen, please return to nearest emergency department.

## 2019-11-27 NOTE — ED Provider Notes (Signed)
[facility] EMERGENCY DEPARTMENT Provider Note   CSN: [id number] Arrival date & time: 11/27/19  1113     History Chief Complaint  Patient presents with  . Abdominal Pain    Mario Beck is a 39 y.o. male.  HPI 39 year old male presents with abdominal pain.  Started 3 days ago.  Started after drinking a couple cocktails.  This is a recurrent issue for him and he believes it is from pancreatitis.  He was having vomiting up until yesterday.  Was seen here.  Pain continues and instead of being a 10 it is now at 9.  He tried ibuprofen and Tylenol.  No blood in his emesis.  He has had diarrhea.  Now he is having a little bit of chest pain.  Feels like the pain goes from his upper abdomen into his chest.  No fevers.  No significant back pain.   Past Medical History:  Diagnosis Date  . Alcohol abuse   . Asthma   . Depression   . Hypertension     There are no problems to display for this patient.   Past Surgical History:  Procedure Laterality Date  . FRACTURE SURGERY     Left leg as a kid       Family History  Problem Relation Age of Onset  . Colon cancer Neg Hx   . Esophageal cancer Neg Hx     Social History   Tobacco Use  . Smoking status: Current Every Day Smoker    Packs/day: 1.00  . Smokeless tobacco: Current User  . Tobacco comment: Does pouches as welll   Substance Use Topics  . Alcohol use: Yes    Comment: 1 pint daily  . Drug use: No    Home Medications Prior to Admission medications   Medication Sig Start Date End Date Taking? Authorizing Provider  amLODipine (NORVASC) 10 MG tablet Take 1 tablet (10 mg total) by mouth daily. 11/26/19   Khatri, Hina, PA-C  dicyclomine (BENTYL) 20 MG tablet Take 1 tablet (20 mg total) by mouth 2 (two) times daily. 11/26/19   Khatri, Hina, PA-C  esomeprazole (NEXIUM) 40 MG capsule Take 1 capsule (40 mg total) by mouth 2 (two) times daily before a meal. 07/18/19   Dartha Lodge, PA-C  gabapentin (NEURONTIN) 300 MG  capsule Take 300 mg by mouth 3 (three) times daily. 08/06/19   [provider]  mirtazapine (REMERON) 15 MG tablet Take 1 tablet (15 mg total) by mouth at bedtime. 11/26/19   Khatri, Hina, PA-C  ondansetron (ZOFRAN ODT) 4 MG disintegrating tablet Take 1 tablet (4 mg total) by mouth every 8 (eight) hours as needed for nausea or vomiting. 11/26/19   Khatri, Hina, PA-C  ondansetron (ZOFRAN) 4 MG tablet Take 1 tablet (4 mg total) by mouth every 8 (eight) hours as needed for nausea or vomiting. 10/04/19   Arthor Captain, PA-C    Allergies    Lisinopril and Shellfish allergy  Review of Systems   Review of Systems  Constitutional: Negative for fever.  Cardiovascular: Positive for chest pain.  Gastrointestinal: Positive for abdominal pain and diarrhea.  All other systems reviewed and are negative.   Physical Exam Updated Vital Signs BP (!) 150/99 (BP Location: Right Arm)   Pulse 74   Temp 98 F (36.7 C) (Oral)   Resp 14   SpO2 98%   Physical Exam Vitals and nursing note reviewed.  Constitutional:      General: He is not in acute  distress.    Appearance: He is well-developed. He is not ill-appearing or diaphoretic.  HENT:     Head: Normocephalic and atraumatic.     Right Ear: External ear normal.     Left Ear: External ear normal.     Nose: Nose normal.  Eyes:     General:        Right eye: No discharge.        Left eye: No discharge.  Cardiovascular:     Rate and Rhythm: Normal rate and regular rhythm.     Heart sounds: Normal heart sounds.  Pulmonary:     Effort: Pulmonary effort is normal.     Breath sounds: Normal breath sounds.  Abdominal:     Palpations: Abdomen is soft.     Tenderness: There is generalized abdominal tenderness (mild, nonfocal). There is no right CVA tenderness or left CVA tenderness.  Musculoskeletal:     Cervical back: Neck supple.  Skin:    General: Skin is warm and dry.  Neurological:     Mental Status: He is alert.  Psychiatric:         Mood and Affect: Mood is not anxious.     ED Results / Procedures / Treatments   Labs (all labs ordered are listed, but only abnormal results are displayed) Labs Reviewed  LIPASE, BLOOD - Abnormal; Notable for the following components:      Result Value   Lipase 176 (*)    All other components within normal limits  COMPREHENSIVE METABOLIC PANEL - Abnormal; Notable for the following components:   Glucose, Bld 110 (*)    All other components within normal limits  CBC WITH DIFFERENTIAL/PLATELET - Abnormal; Notable for the following components:   MCV 79.8 (*)    All other components within normal limits    EKG EKG Interpretation  Date/Time:  Wednesday November 27 2019 14:07:20 EST Ventricular Rate:  73 PR Interval:    QRS Duration: 85 QT Interval:  408 QTC Calculation: 450 R Axis:   50 Text Interpretation: Sinus rhythm Consider left ventricular hypertrophy no acute ST/T changes tachycardia resolved compared to April 2019 Confirmed by Sherwood Gambler [phone]) on 11/27/2019 2:13:30 PM   Radiology DG Chest Portable 1 View  Result Date: 11/27/2019 CLINICAL DATA:  Chest pain. Vomiting. Evaluated yesterday for abdominal pain. Not describes pain is epigastric rising into the chest. EXAM: PORTABLE CHEST 1 VIEW COMPARISON:  07/11/2018 FINDINGS: Normal heart, mediastinum and hila. Clear lungs.  No pleural effusion or pneumothorax. Skeletal structures are grossly intact. IMPRESSION: No active disease. Electronically Signed   By: Lajean Manes M.D.   On: 11/27/2019 14:35    Procedures Procedures (including critical care time)  Medications Ordered in ED Medications  HYDROmorphone (DILAUDID) injection 1 mg (has no administration in time range)  oxyCODONE (Oxy IR/ROXICODONE) immediate release tablet 5 mg (has no administration in time range)  HYDROmorphone (DILAUDID) injection 1 mg (1 mg Intravenous Given 11/27/19 1417)  sodium chloride 0.9 % bolus 1,000 mL (1,000 mLs Intravenous New  Bag/Given 11/27/19 1411)  alum & mag hydroxide-simeth (MAALOX/MYLANTA) 200-200-20 MG/5ML suspension 30 mL (30 mLs Oral Given 11/27/19 1416)    And  lidocaine (XYLOCAINE) 2 % viscous mouth solution 15 mL (15 mLs Oral Given 11/27/19 1416)    ED Course  I have reviewed the triage vital signs and the nursing notes.  Pertinent labs & imaging results that were available during my care of the patient were reviewed by me and considered in my  medical decision making (see chart for details).    MDM Rules/Calculators/A&P                      Patient is well-appearing here.  Vital signs are stable besides some hypertension.  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  First dose of Dilaudid and GI cocktail did not seem to move his pain too much.  Will redose and give some oral meds.  His lipase is higher than yesterday and consistent with pancreatitis but my suspicion for emergent condition with this such as necrosis or abscess/pseudocyst is very unlikely.  Care to Dr. Rush [facility] with pain control pending. Final Clinical Impression(s) / ED Diagnoses Final diagnoses:  Alcohol-induced acute pancreatitis, unspecified complication status    Rx / DC Orders ED Discharge Orders    None       Pricilla Loveless, MD 11/27/19 1529

## 2019-11-27 NOTE — ED Triage Notes (Signed)
Pt seen here yesterday for abd pain. Today he states that the pain is epigastric and rising up through his central chest. States it's a 10/10 burning/aching pain.

## 2020-02-09 ENCOUNTER — Emergency Department (HOSPITAL_COMMUNITY)
Admission: EM | Admit: 2020-02-09 | Discharge: 2020-02-10 | Discharge status: Against Medical Advice | Payer: BLUE CROSS/BLUE SHIELD | Attending: Emergency Medicine | Admitting: Emergency Medicine

## 2020-02-09 ENCOUNTER — Other Ambulatory Visit: Payer: Self-pay

## 2020-02-09 DIAGNOSIS — Z5321 Procedure and treatment not carried out due to patient leaving prior to being seen by health care provider: Secondary | ICD-10-CM | POA: Insufficient documentation

## 2020-02-09 DIAGNOSIS — R102 Pelvic and perineal pain: Secondary | ICD-10-CM | POA: Diagnosis not present

## 2020-02-09 NOTE — ED Triage Notes (Signed)
Pt c/o n/v/d and lower abdominal pain.

## 2020-02-10 ENCOUNTER — Other Ambulatory Visit: Payer: Self-pay

## 2020-02-10 ENCOUNTER — Emergency Department (HOSPITAL_BASED_OUTPATIENT_CLINIC_OR_DEPARTMENT_OTHER)
Admission: EM | Admit: 2020-02-10 | Discharge: 2020-02-10 | Disposition: A | Discharge status: Home / Self Care | Payer: BLUE CROSS/BLUE SHIELD | Source: Home / Self Care | Attending: Emergency Medicine | Admitting: Emergency Medicine

## 2020-02-10 ENCOUNTER — Encounter (HOSPITAL_BASED_OUTPATIENT_CLINIC_OR_DEPARTMENT_OTHER): Payer: Self-pay | Admitting: *Deleted

## 2020-02-10 DIAGNOSIS — Z79899 Other long term (current) drug therapy: Secondary | ICD-10-CM | POA: Insufficient documentation

## 2020-02-10 DIAGNOSIS — K529 Noninfective gastroenteritis and colitis, unspecified: Secondary | ICD-10-CM

## 2020-02-10 DIAGNOSIS — I10 Essential (primary) hypertension: Secondary | ICD-10-CM | POA: Insufficient documentation

## 2020-02-10 DIAGNOSIS — Z888 Allergy status to other drugs, medicaments and biological substances status: Secondary | ICD-10-CM | POA: Insufficient documentation

## 2020-02-10 DIAGNOSIS — Z91013 Allergy to seafood: Secondary | ICD-10-CM | POA: Insufficient documentation

## 2020-02-10 DIAGNOSIS — J45909 Unspecified asthma, uncomplicated: Secondary | ICD-10-CM | POA: Insufficient documentation

## 2020-02-10 DIAGNOSIS — F17210 Nicotine dependence, cigarettes, uncomplicated: Secondary | ICD-10-CM | POA: Insufficient documentation

## 2020-02-10 LAB — URINALYSIS, ROUTINE W REFLEX MICROSCOPIC
Bacteria, UA: NONE SEEN
Bilirubin Urine: NEGATIVE
Glucose, UA: NEGATIVE mg/dL
Hgb urine dipstick: NEGATIVE
Ketones, ur: NEGATIVE mg/dL
Leukocytes,Ua: NEGATIVE
Nitrite: NEGATIVE
Protein, ur: 30 mg/dL — AB
Specific Gravity, Urine: 1.015 (ref 1.005–1.030)
pH: 6 (ref 5.0–8.0)

## 2020-02-10 LAB — COMPREHENSIVE METABOLIC PANEL
ALT: 29 U/L (ref 0–44)
AST: 36 U/L (ref 15–41)
Albumin: 4.1 g/dL (ref 3.5–5.0)
Alkaline Phosphatase: 61 U/L (ref 38–126)
Anion gap: 12 (ref 5–15)
BUN: 11 mg/dL (ref 6–20)
CO2: 24 mmol/L (ref 22–32)
Calcium: 9.4 mg/dL (ref 8.9–10.3)
Chloride: 106 mmol/L (ref 98–111)
Creatinine, Ser: 1.11 mg/dL (ref 0.61–1.24)
GFR calc Af Amer: >60 mL/min (ref >60)
GFR calc non Af Amer: >60 mL/min (ref >60)
Glucose, Bld: 105 mg/dL — ABNORMAL HIGH (ref 70–99)
Potassium: 4.6 mmol/L (ref 3.5–5.1)
Sodium: 142 mmol/L (ref 135–145)
Total Bilirubin: 0.9 mg/dL (ref 0.3–1.2)
Total Protein: 6.9 g/dL (ref 6.5–8.1)

## 2020-02-10 LAB — CBC
HCT: 42.7 % (ref 39.0–52.0)
Hemoglobin: 14.7 g/dL (ref 13.0–17.0)
MCH: 27.9 pg (ref 26.0–34.0)
MCHC: 34.4 g/dL (ref 30.0–36.0)
MCV: 81.2 fL (ref 80.0–100.0)
Platelets: 280 10*3/uL (ref 150–400)
RBC: 5.26 MIL/uL (ref 4.22–5.81)
RDW: 13.7 % (ref 11.5–15.5)
WBC: 6.8 10*3/uL (ref 4.0–10.5)
nRBC: 0 % (ref 0.0–0.2)

## 2020-02-10 LAB — LIPASE, BLOOD: Lipase: 24 U/L (ref 11–51)

## 2020-02-10 MED ORDER — ALUM & MAG HYDROXIDE-SIMETH 200-200-20 MG/5ML PO SUSP
30.0000 mL | Freq: Once | ORAL | Status: AC
  Administered 2020-02-10: 30 mL via ORAL
  Filled 2020-02-10: qty 30

## 2020-02-10 MED ORDER — SUCRALFATE 1 G PO TABS
1.0000 g | ORAL_TABLET | Freq: Once | ORAL | Status: DC
  Filled 2020-02-10: qty 1

## 2020-02-10 MED ORDER — SUCRALFATE 1 G PO TABS
1.0000 g | ORAL_TABLET | Freq: Three times a day (TID) | ORAL | 0 refills | Status: DC
Start: 2020-02-10 — End: 2020-02-23

## 2020-02-10 MED ORDER — ONDANSETRON 4 MG PO TBDP
4.0000 mg | ORAL_TABLET | Freq: Once | ORAL | Status: AC
  Administered 2020-02-10: 4 mg via ORAL
  Filled 2020-02-10: qty 1

## 2020-02-10 MED ORDER — SUCRALFATE 1 GM/10ML PO SUSP
ORAL | Status: AC
  Administered 2020-02-10: 1 g
  Filled 2020-02-10: qty 10

## 2020-02-10 MED ORDER — ONDANSETRON 4 MG PO TBDP: 4.0000 mg | ORAL_TABLET | Freq: Three times a day (TID) | ORAL | 0 refills | Status: DC | PRN

## 2020-02-10 MED ORDER — ONDANSETRON 4 MG PO TBDP
4.0000 mg | ORAL_TABLET | Freq: Once | ORAL | Status: AC | PRN
  Administered 2020-02-10: 4 mg via ORAL
  Filled 2020-02-10: qty 1

## 2020-02-10 MED FILL — SUCRALFATE 1 GM TABLET: 1 | 7 days supply | Qty: 30 | Fill #0

## 2020-02-10 MED FILL — ONDANSETRON ODT 4MG TBDP: 4 | 7 days supply | Qty: 20 | Fill #0

## 2020-02-10 NOTE — ED Triage Notes (Addendum)
Pt reports n/v/d since yesterday. He was seen at [facility] earlier but left due to the wait. He had blood work done there. C/o 7/10 lower abdominal pain and is tearful in triage. Pt states he is out of all his medications including his BP meds

## 2020-02-10 NOTE — ED Notes (Signed)
Pt said that he will be leaving because of long wait.

## 2020-02-10 NOTE — ED Provider Notes (Signed)
[facility] EMERGENCY DEPARTMENT Provider Note   CSN: [id number] Arrival date & time: 02/10/20  0105     History Chief Complaint  Patient presents with  . Emesis    Mario Beck is a 39 y.o. male.  HPI     This is a 39 year old male with a history of alcohol abuse, depression, hypertension who presents with abdominal pain, nausea and vomiting, and diarrhea.  Patient reports nausea, vomiting, and diarrhea since Saturday.  He reports nonbilious, nonbloody emesis and liquidy stools.  He reports sharp abdominal pain that is worse with eating.  It is nonradiating.  Patient reports that he has been able to tolerate ginger ale while in triage.  He does continue to drink almost daily.  History of alcohol induced pancreatitis.  Currently the patient is pain-free.  He did not take anything for his symptoms at home but was given Zofran in triage.  He denies fevers or known sick contacts.  No known Covid exposures.  Past Medical History:  Diagnosis Date  . Alcohol abuse   . Asthma   . Depression   . Hypertension     There are no problems to display for this patient.   Past Surgical History:  Procedure Laterality Date  . FRACTURE SURGERY     Left leg as a kid       Family History  Problem Relation Age of Onset  . Colon cancer Neg Hx   . Esophageal cancer Neg Hx     Social History   Tobacco Use  . Smoking status: Current Every Day Smoker    Packs/day: 1.00    Types: Cigarettes  . Smokeless tobacco: Current User    Types: Snuff  . Tobacco comment: Does pouches as welll   Substance Use Topics  . Alcohol use: Yes    Comment: states "1 drink a day"  . Drug use: No    Home Medications Prior to Admission medications   Medication Sig Start Date End Date Taking? Authorizing Provider  amLODipine (NORVASC) 10 MG tablet Take 1 tablet (10 mg total) by mouth daily. 11/26/19   Khatri, Hina, PA-C  dicyclomine (BENTYL) 20 MG tablet Take 1 tablet (20 mg total) by mouth 2  (two) times daily. 11/26/19   Khatri, Hina, PA-C  esomeprazole (NEXIUM) 40 MG capsule Take 1 capsule (40 mg total) by mouth 2 (two) times daily before a meal. 07/18/19   Dartha Lodge, PA-C  gabapentin (NEURONTIN) 300 MG capsule Take 300 mg by mouth 3 (three) times daily. 08/06/19   [provider]  mirtazapine (REMERON) 15 MG tablet Take 1 tablet (15 mg total) by mouth at bedtime. 11/26/19   Khatri, Hina, PA-C  ondansetron (ZOFRAN ODT) 4 MG disintegrating tablet Take 1 tablet (4 mg total) by mouth every 8 (eight) hours as needed for nausea or vomiting. 11/26/19   Khatri, Hina, PA-C  ondansetron (ZOFRAN) 4 MG tablet Take 1 tablet (4 mg total) by mouth every 8 (eight) hours as needed for nausea or vomiting. 10/04/19   Arthor Captain, PA-C  oxyCODONE-acetaminophen (PERCOCET/ROXICET) 5-325 MG tablet Take 1 tablet by mouth every 4 (four) hours as needed. 11/27/19   Tegeler, Canary Brim, MD    Allergies    Lisinopril and Shellfish allergy  Review of Systems   Review of Systems  Constitutional: Negative for fever.  Respiratory: Negative for shortness of breath.   Cardiovascular: Negative for chest pain.  Gastrointestinal: Positive for abdominal pain, diarrhea, nausea and vomiting.  Genitourinary: Negative  for dysuria.  All other systems reviewed and are negative.   Physical Exam Updated Vital Signs BP (!) 161/110 (BP Location: Right Arm)   Pulse 88   Temp 98.9 F (37.2 C) (Oral)   Resp 20   Ht 1.905 m (6\' 3" )   Wt 86.2 kg   SpO2 100%   BMI 23.75 kg/m   Physical Exam Vitals and nursing note reviewed.  Constitutional:      Appearance: He is well-developed. He is not ill-appearing.  HENT:     Head: Normocephalic and atraumatic.     Nose: Nose normal.     Mouth/Throat:     Mouth: Mucous membranes are moist.  Eyes:     Pupils: Pupils are equal, round, and reactive to light.  Cardiovascular:     Rate and Rhythm: Normal rate and regular rhythm.     Heart sounds: Normal heart  sounds. No murmur.  Pulmonary:     Effort: Pulmonary effort is normal. No respiratory distress.     Breath sounds: Normal breath sounds. No wheezing.  Abdominal:     General: Bowel sounds are normal.     Palpations: Abdomen is soft.     Tenderness: There is no abdominal tenderness. There is no rebound.  Musculoskeletal:     Cervical back: Neck supple.     Right lower leg: No edema.     Left lower leg: No edema.  Lymphadenopathy:     Cervical: No cervical adenopathy.  Skin:    General: Skin is warm and dry.  Neurological:     Mental Status: He is alert and oriented to person, place, and time.  Psychiatric:        Mood and Affect: Mood normal.     ED Results / Procedures / Treatments   Labs (all labs ordered are listed, but only abnormal results are displayed) Labs Reviewed - No data to display  EKG None  Radiology No results found.  Procedures Procedures (including critical care time)  Medications Ordered in ED Medications  alum & mag hydroxide-simeth (MAALOX/MYLANTA) 200-200-20 MG/5ML suspension 30 mL (has no administration in time range)  sucralfate (CARAFATE) tablet 1 g (has no administration in time range)  sucralfate (CARAFATE) 1 GM/10ML suspension (has no administration in time range)  ondansetron (ZOFRAN-ODT) disintegrating tablet 4 mg (4 mg Oral Given 02/10/20 0130)  ondansetron (ZOFRAN-ODT) disintegrating tablet 4 mg (4 mg Oral Given 02/10/20 02/12/20)    ED Course  I have reviewed the triage vital signs and the nursing notes.  Pertinent labs & imaging results that were available during my care of the patient were reviewed by me and considered in my medical decision making (see chart for details).    MDM Rules/Calculators/A&P                       Patient presents with nausea, vomiting, and diarrhea.  Overall nontoxic-appearing vital signs notable for blood pressure of 161/110.  Lab work reviewed as patient was originally being evaluated at [facility].  Lab work is normal including normal LFTs and lipase.  No ketones in the urine to suggest significant dehydration and he appears clinically well-hydrated.  His abdominal exam is nontender.  Doubt intra-abdominal process including cholecystitis or appendicitis.  With a normal lipase, doubt pancreatitis.  Suspect gastritis versus gastroenteritis.  There is a GI virus circulating in the community.  He also may have an element of chronic gastritis related to alcohol use.  Patient has  been hydrating in the waiting room after Zofran.  Patient was given a GI cocktail for discomfort and was able to orally hydrate.  Will discharge home with supportive measures and Zofran.  After history, exam, and medical workup I feel the patient has been appropriately medically screened and is safe for discharge home. Pertinent diagnoses were discussed with the patient. Patient was given return precautions.   Final Clinical Impression(s) / ED Diagnoses Final diagnoses:  Gastroenteritis    Rx / DC Orders ED Discharge Orders    None       Aleah Ahlgrim, Barbette Hair, MD 02/10/20 [phone]

## 2020-02-10 NOTE — Discharge Instructions (Addendum)
You were seen today for nausea, vomiting, and diarrhea.  This is likely related to a viral illness.  You could have some chronic gastritis related to alcohol use.  Take Zofran as needed for nausea.  Make sure to stay hydrated.

## 2020-02-17 ENCOUNTER — Encounter: Payer: Self-pay | Admitting: *Deleted

## 2020-02-17 ENCOUNTER — Other Ambulatory Visit: Payer: Self-pay

## 2020-02-17 ENCOUNTER — Emergency Department: Payer: BLUE CROSS/BLUE SHIELD

## 2020-02-17 ENCOUNTER — Inpatient Hospital Stay
Admission: EM | Admit: 2020-02-17 | Discharge: 2020-02-19 | DRG: 439 | Discharge status: Against Medical Advice | Payer: BLUE CROSS/BLUE SHIELD | Attending: Internal Medicine | Admitting: Internal Medicine

## 2020-02-17 ENCOUNTER — Encounter: Payer: Self-pay | Admitting: Emergency Medicine

## 2020-02-17 ENCOUNTER — Emergency Department
Admission: EM | Admit: 2020-02-17 | Discharge: 2020-02-17 | Disposition: A | Discharge status: Home / Self Care | Payer: BLUE CROSS/BLUE SHIELD | Source: Home / Self Care | Attending: Student | Admitting: Student

## 2020-02-17 DIAGNOSIS — Z79899 Other long term (current) drug therapy: Secondary | ICD-10-CM

## 2020-02-17 DIAGNOSIS — G629 Polyneuropathy, unspecified: Secondary | ICD-10-CM | POA: Diagnosis present

## 2020-02-17 DIAGNOSIS — Z20822 Contact with and (suspected) exposure to covid-19: Secondary | ICD-10-CM | POA: Diagnosis present

## 2020-02-17 DIAGNOSIS — Z5329 Procedure and treatment not carried out because of patient's decision for other reasons: Secondary | ICD-10-CM | POA: Diagnosis not present

## 2020-02-17 DIAGNOSIS — F329 Major depressive disorder, single episode, unspecified: Secondary | ICD-10-CM | POA: Diagnosis present

## 2020-02-17 DIAGNOSIS — J45909 Unspecified asthma, uncomplicated: Secondary | ICD-10-CM | POA: Diagnosis present

## 2020-02-17 DIAGNOSIS — K852 Alcohol induced acute pancreatitis without necrosis or infection: Secondary | ICD-10-CM

## 2020-02-17 DIAGNOSIS — K219 Gastro-esophageal reflux disease without esophagitis: Secondary | ICD-10-CM | POA: Diagnosis present

## 2020-02-17 DIAGNOSIS — F17210 Nicotine dependence, cigarettes, uncomplicated: Secondary | ICD-10-CM | POA: Diagnosis present

## 2020-02-17 DIAGNOSIS — Z79891 Long term (current) use of opiate analgesic: Secondary | ICD-10-CM

## 2020-02-17 DIAGNOSIS — I10 Essential (primary) hypertension: Secondary | ICD-10-CM

## 2020-02-17 DIAGNOSIS — K859 Acute pancreatitis without necrosis or infection, unspecified: Secondary | ICD-10-CM | POA: Diagnosis present

## 2020-02-17 DIAGNOSIS — F1020 Alcohol dependence, uncomplicated: Secondary | ICD-10-CM

## 2020-02-17 DIAGNOSIS — F10230 Alcohol dependence with withdrawal, uncomplicated: Secondary | ICD-10-CM | POA: Diagnosis present

## 2020-02-17 DIAGNOSIS — Z716 Tobacco abuse counseling: Secondary | ICD-10-CM

## 2020-02-17 DIAGNOSIS — F1010 Alcohol abuse, uncomplicated: Secondary | ICD-10-CM | POA: Insufficient documentation

## 2020-02-17 DIAGNOSIS — Z9114 Patient's other noncompliance with medication regimen: Secondary | ICD-10-CM

## 2020-02-17 DIAGNOSIS — K2980 Duodenitis without bleeding: Secondary | ICD-10-CM | POA: Diagnosis present

## 2020-02-17 DIAGNOSIS — Z8719 Personal history of other diseases of the digestive system: Secondary | ICD-10-CM

## 2020-02-17 LAB — URINALYSIS, COMPLETE (UACMP) WITH MICROSCOPIC
Bacteria, UA: NONE SEEN
Bilirubin Urine: NEGATIVE
Glucose, UA: NEGATIVE mg/dL
Hgb urine dipstick: NEGATIVE
Ketones, ur: NEGATIVE mg/dL
Leukocytes,Ua: NEGATIVE
Nitrite: NEGATIVE
Protein, ur: 100 mg/dL — AB
Specific Gravity, Urine: 1.026 (ref 1.005–1.030)
pH: 5 (ref 5.0–8.0)

## 2020-02-17 LAB — COMPREHENSIVE METABOLIC PANEL
ALT: 33 U/L (ref 0–44)
AST: 29 U/L (ref 15–41)
Albumin: 4.4 g/dL (ref 3.5–5.0)
Alkaline Phosphatase: 70 U/L (ref 38–126)
Anion gap: 15 (ref 5–15)
BUN: 13 mg/dL (ref 6–20)
CO2: 25 mmol/L (ref 22–32)
Calcium: 9.2 mg/dL (ref 8.9–10.3)
Chloride: 98 mmol/L (ref 98–111)
Creatinine, Ser: 0.77 mg/dL (ref 0.61–1.24)
GFR calc Af Amer: >60 mL/min (ref >60)
GFR calc non Af Amer: >60 mL/min (ref >60)
Glucose, Bld: 136 mg/dL — ABNORMAL HIGH (ref 70–99)
Potassium: 3.1 mmol/L — ABNORMAL LOW (ref 3.5–5.1)
Sodium: 138 mmol/L (ref 135–145)
Total Bilirubin: 1.1 mg/dL (ref 0.3–1.2)
Total Protein: 7.9 g/dL (ref 6.5–8.1)

## 2020-02-17 LAB — CBC
HCT: 45 % (ref 39.0–52.0)
Hemoglobin: 15.7 g/dL (ref 13.0–17.0)
MCH: 28.2 pg (ref 26.0–34.0)
MCHC: 34.9 g/dL (ref 30.0–36.0)
MCV: 80.8 fL (ref 80.0–100.0)
Platelets: 173 10*3/uL (ref 150–400)
RBC: 5.57 MIL/uL (ref 4.22–5.81)
RDW: 13.6 % (ref 11.5–15.5)
WBC: 19 10*3/uL — ABNORMAL HIGH (ref 4.0–10.5)
nRBC: 0 % (ref 0.0–0.2)

## 2020-02-17 LAB — TROPONIN I (HIGH SENSITIVITY)
Troponin I (High Sensitivity): 19 ng/L — ABNORMAL HIGH (ref <18)
Troponin I (High Sensitivity): 15 ng/L (ref <18)

## 2020-02-17 LAB — LIPASE, BLOOD: Lipase: 162 U/L — ABNORMAL HIGH (ref 11–51)

## 2020-02-17 MED ORDER — GABAPENTIN 300 MG PO CAPS
300.0000 mg | ORAL_CAPSULE | Freq: Once | ORAL | Status: AC
  Administered 2020-02-17: 300 mg via ORAL
  Filled 2020-02-17: qty 1

## 2020-02-17 MED ORDER — KETOROLAC TROMETHAMINE 30 MG/ML IJ SOLN
15.0000 mg | Freq: Once | INTRAMUSCULAR | Status: AC
  Administered 2020-02-17: 15 mg via INTRAVENOUS
  Filled 2020-02-17: qty 1

## 2020-02-17 MED ORDER — MORPHINE SULFATE (PF) 4 MG/ML IV SOLN
4.0000 mg | Freq: Once | INTRAVENOUS | Status: DC
  Filled 2020-02-17: qty 1

## 2020-02-17 MED ORDER — KETOROLAC TROMETHAMINE 10 MG PO TABS: 10.0000 mg | ORAL_TABLET | Freq: Four times a day (QID) | ORAL | 0 refills | Status: DC | PRN

## 2020-02-17 MED ORDER — ONDANSETRON HCL 4 MG/2ML IJ SOLN
4.0000 mg | Freq: Once | INTRAMUSCULAR | Status: AC
  Administered 2020-02-18: 4 mg via INTRAVENOUS
  Filled 2020-02-17: qty 2

## 2020-02-17 MED ORDER — PANTOPRAZOLE SODIUM 40 MG IV SOLR
40.0000 mg | Freq: Once | INTRAVENOUS | Status: AC
  Administered 2020-02-17: 21:00:00 40 mg via INTRAVENOUS
  Filled 2020-02-17: qty 40

## 2020-02-17 MED ORDER — GABAPENTIN 100 MG PO CAPS
300.0000 mg | ORAL_CAPSULE | Freq: Three times a day (TID) | ORAL | 0 refills | Status: DC
Start: 2020-02-17 — End: 2020-02-21

## 2020-02-17 MED ORDER — IOHEXOL 300 MG/ML  SOLN
100.0000 mL | Freq: Once | INTRAMUSCULAR | Status: AC | PRN
  Administered 2020-02-17: 20:00:00 100 mL via INTRAVENOUS

## 2020-02-17 MED ORDER — ONDANSETRON HCL 4 MG/2ML IJ SOLN
4.0000 mg | Freq: Once | INTRAMUSCULAR | Status: AC
  Administered 2020-02-17: 20:00:00 4 mg via INTRAVENOUS
  Filled 2020-02-17: qty 2

## 2020-02-17 MED ORDER — SODIUM CHLORIDE 0.9% FLUSH
3.0000 mL | Freq: Once | INTRAVENOUS | Status: AC
  Administered 2020-02-17: 3 mL via INTRAVENOUS

## 2020-02-17 MED ORDER — SODIUM CHLORIDE 0.9 % IV SOLN
1.0000 mg | Freq: Once | INTRAVENOUS | Status: AC
  Administered 2020-02-17: 1 mg via INTRAVENOUS
  Filled 2020-02-17: qty 0.2

## 2020-02-17 MED ORDER — ONDANSETRON HCL 4 MG PO TABS
4.0000 mg | ORAL_TABLET | Freq: Three times a day (TID) | ORAL | 0 refills | Status: DC | PRN
Start: 2020-02-17 — End: 2020-02-23

## 2020-02-17 MED ORDER — LACTATED RINGERS IV BOLUS
1000.0000 mL | Freq: Once | INTRAVENOUS | Status: AC
  Administered 2020-02-18: 1000 mL via INTRAVENOUS

## 2020-02-17 MED ORDER — THIAMINE HCL 100 MG/ML IJ SOLN
100.0000 mg | Freq: Once | INTRAMUSCULAR | Status: AC
  Administered 2020-02-17: 21:00:00 100 mg via INTRAVENOUS
  Filled 2020-02-17: qty 2

## 2020-02-17 MED ORDER — LACTATED RINGERS IV BOLUS
2000.0000 mL | Freq: Once | INTRAVENOUS | Status: AC
  Administered 2020-02-17: 20:00:00 2000 mL via INTRAVENOUS

## 2020-02-17 NOTE — ED Notes (Signed)
Patient discharged to home per MD order. Patient in stable condition, and deemed medically cleared by ED provider for discharge. Discharge instructions reviewed with patient/family using "Teach Back"; verbalized understanding of medication education and administration, and information about follow-up care. Denies further concerns. ° °

## 2020-02-17 NOTE — ED Triage Notes (Signed)
Patient ambulatory to triage with steady gait, without difficulty or distress noted, mask in place; pt d/c ago; st he was to be admitted into the hospital but didn't want to stay; pt is returned for admission as recommended; charge nurse notifed

## 2020-02-17 NOTE — ED Triage Notes (Signed)
Pt reports abd pain for 4 days.  Hx pancreatitis.  Pt reports etoh use almost daily.  No etoh today.  Pt has nausea.  No v/d.  Pt alert.

## 2020-02-17 NOTE — ED Notes (Addendum)
Pt transported to CT ?

## 2020-02-17 NOTE — Discharge Instructions (Addendum)
Thank you for letting us take care of you in the emergency department today.   Please continue to take any regular, prescribed medications.   New medications we have prescribed:  Toradol Gabapentin  Please follow up with: Your primary care doctor to review your ER visit and follow up on your symptoms.    Please return to the ER immediately for any new or worsening symptoms as we discussed, including worsening pain, nausea, vomiting, difficulty keeping medication or fluids down, dehydration, passing out, fevers, or any other signs/symptoms concerning to you.

## 2020-02-17 NOTE — ED Provider Notes (Signed)
Vitals:   02/17/20 2020 02/17/20 2026  BP:  (!) 145/99  Pulse: (!) 103 (!) 103  Resp: 20 20  Temp:    SpO2: 98% 100%   Discussed with patient and reviewed his results with him and did recommend admission to the hospital, but he reports he feels better and that he will adhere to strict return precautions.  He understands return precautions, and he does appear well.  Alert oriented no distress with pain well controlled.  I am concerned and expressed concern to him about the degree of his pancreatitis and that I would still recommend he be admitted to the hospital as did Dr. Colon Branch, but he declines this and will follow closely as an outpatient and return right away if worsening symptoms or new concerns   Sharyn Creamer, MD 02/17/20 2210

## 2020-02-17 NOTE — ED Provider Notes (Signed)
[facility] Emergency Department Provider Note  ____________________________________________   First MD Initiated Contact with Patient 02/17/20 2350     (approximate)  I have reviewed the triage vital signs and the nursing notes.   HISTORY  Chief Complaint Abdominal Pain    HPI Mario Beck is a 39 y.o. male  With h/o chronic alcohol dependence, recurrent pancreatitis here with nauesa, vomiting. Pt was just seen yesterday for similar sx and diagnosed with alcohol pancreatitis. He had ongoing n/v but wanted to leave at that time. However, since leaving, he has had ongoing n/v and worsening pain and has been unable to eat or drink. He subsequently presents for evaluation. Denies any fever, chills. No blood in his emesis. Denies any tremors but does feel "on edge" since his last drink was >12 hr ago. No known h/o DTs per his report. Pain is aching, gnawing, epigastric and radiates to his back. Worse w/ eating, palpation. No alleviating factors.        Past Medical History:  Diagnosis Date  . Alcohol abuse   . Asthma   . Depression   . Hypertension     There are no problems to display for this patient.   Past Surgical History:  Procedure Laterality Date  . FRACTURE SURGERY     Left leg as a kid    Prior to Admission medications   Medication Sig Start Date End Date Taking? Authorizing Provider  amLODipine (NORVASC) 10 MG tablet Take 1 tablet (10 mg total) by mouth daily. 11/26/19   Khatri, Hina, PA-C  dicyclomine (BENTYL) 20 MG tablet Take 1 tablet (20 mg total) by mouth 2 (two) times daily. 11/26/19   Khatri, Hina, PA-C  esomeprazole (NEXIUM) 40 MG capsule Take 1 capsule (40 mg total) by mouth 2 (two) times daily before a meal. 07/18/19   Dartha Lodge, PA-C  gabapentin (NEURONTIN) 100 MG capsule Take 3 capsules (300 mg total) by mouth 3 (three) times daily for 7 days. 02/17/20 02/24/20  Miguel Aschoff., MD  gabapentin (NEURONTIN) 300 MG capsule Take 300 mg  by mouth 3 (three) times daily. 08/06/19   [provider]  ketorolac (TORADOL) 10 MG tablet Take 1 tablet (10 mg total) by mouth every 6 (six) hours as needed for up to 5 days. 02/17/20 02/22/20  Miguel Aschoff., MD  mirtazapine (REMERON) 15 MG tablet Take 1 tablet (15 mg total) by mouth at bedtime. 11/26/19   Khatri, Hina, PA-C  ondansetron (ZOFRAN ODT) 4 MG disintegrating tablet Take 1 tablet (4 mg total) by mouth every 8 (eight) hours as needed for nausea or vomiting. 11/26/19   Khatri, Hina, PA-C  ondansetron (ZOFRAN ODT) 4 MG disintegrating tablet Take 1 tablet (4 mg total) by mouth every 8 (eight) hours as needed. 02/10/20   Horton, Mayer Masker, MD  ondansetron (ZOFRAN) 4 MG tablet Take 1 tablet (4 mg total) by mouth every 8 (eight) hours as needed for nausea or vomiting. 10/04/19   Arthor Captain, PA-C  ondansetron (ZOFRAN) 4 MG tablet Take 1 tablet (4 mg total) by mouth every 8 (eight) hours as needed for up to 7 days for nausea or vomiting. 02/17/20 02/24/20  Miguel Aschoff., MD  oxyCODONE-acetaminophen (PERCOCET/ROXICET) 5-325 MG tablet Take 1 tablet by mouth every 4 (four) hours as needed. 11/27/19   Tegeler, Canary Brim, MD  sucralfate (CARAFATE) 1 g tablet Take 1 tablet (1 g total) by mouth 4 (four) times daily -  with meals and at  bedtime. 02/10/20   Horton, Mayer Masker, MD    Allergies Lisinopril and Shellfish allergy  Family History  Problem Relation Age of Onset  . Colon cancer Neg Hx   . Esophageal cancer Neg Hx     Social History Social History   Tobacco Use  . Smoking status: Current Every Day Smoker    Packs/day: 1.00    Types: Cigarettes  . Smokeless tobacco: Current User    Types: Snuff  . Tobacco comment: Does pouches as welll   Substance Use Topics  . Alcohol use: Yes    Comment: states "1 drink a day"  . Drug use: No    Review of Systems  Review of Systems  Constitutional: Positive for fatigue. Negative for chills and fever.  HENT: Negative for sore  throat.   Respiratory: Negative for shortness of breath.   Cardiovascular: Negative for chest pain.  Gastrointestinal: Positive for abdominal pain, nausea and vomiting.  Genitourinary: Negative for flank pain.  Musculoskeletal: Negative for neck pain.  Skin: Negative for rash and wound.  Allergic/Immunologic: Negative for immunocompromised state.  Neurological: Positive for weakness. Negative for numbness.  Hematological: Does not bruise/bleed easily.  All other systems reviewed and are negative.    ____________________________________________  PHYSICAL EXAM:      VITAL SIGNS: ED Triage Vitals  Enc Vitals Group     BP 02/17/20 2315 (!) 149/99     Pulse Rate 02/17/20 2315 (!) 114     Resp 02/17/20 2315 17     Temp 02/17/20 2315 97.9 F (36.6 C)     Temp Source 02/17/20 2315 Oral     SpO2 02/17/20 2315 99 %     Weight 02/17/20 2305 184 lb 15.5 oz (83.9 kg)     Height 02/17/20 2305 6\' 3"  (1.905 m)     Head Circumference --      Peak Flow --      Pain Score 02/17/20 2305 5     Pain Loc --      Pain Edu? --      Excl. in GC? --      Physical Exam Vitals and nursing note reviewed.  Constitutional:      General: He is not in acute distress.    Appearance: He is well-developed.  HENT:     Head: Normocephalic and atraumatic.  Eyes:     Conjunctiva/sclera: Conjunctivae normal.  Cardiovascular:     Rate and Rhythm: Normal rate and regular rhythm.     Heart sounds: Normal heart sounds.  Pulmonary:     Effort: Pulmonary effort is normal. No respiratory distress.     Breath sounds: No wheezing.  Abdominal:     General: There is no distension.     Tenderness: There is abdominal tenderness in the epigastric area. There is no guarding or rebound.  Musculoskeletal:     Cervical back: Neck supple.  Skin:    General: Skin is warm.     Capillary Refill: Capillary refill takes less than 2 seconds.     Findings: No rash.  Neurological:     Mental Status: He is alert and  oriented to person, place, and time.     Motor: No abnormal muscle tone.       ____________________________________________   LABS (all labs ordered are listed, but only abnormal results are displayed)  Labs Reviewed  RESPIRATORY PANEL BY RT PCR (FLU A&B, COVID)  CBC WITH DIFFERENTIAL/PLATELET  COMPREHENSIVE METABOLIC PANEL  LIPASE, BLOOD  LACTATE DEHYDROGENASE  PROTIME-INR    ____________________________________________  EKG: None ________________________________________  RADIOLOGY All imaging, including plain films, CT scans, and ultrasounds, independently reviewed by me, and interpretations confirmed via formal radiology reads.  ED MD interpretation:   CT A/P: Reviewed, c/w acute pancreatitis with hypoenhancing fluid collection at pancreatic head, duodenitis  Official radiology report(s): CT Abdomen Pelvis W Contrast  Result Date: 02/17/2020 CLINICAL DATA:  Abdomen pain history of pancreatitis EXAM: CT ABDOMEN AND PELVIS WITH CONTRAST TECHNIQUE: Multidetector CT imaging of the abdomen and pelvis was performed using the standard protocol following bolus administration of intravenous contrast. CONTRAST:  OMNIPAQUE IOHEXOL 300 MG/ML  SOLN COMPARISON:  Chest CT 02/01/2018 FINDINGS: Lower chest: Mild gynecomastia. No consolidation or pleural effusion. Mild cardiomegaly. Hepatobiliary: No focal liver abnormality is seen. No gallstones, gallbladder wall thickening, or biliary dilatation. Pancreas: Moderate diffuse inflammatory changes and fluid consistent with acute pancreatitis. Relative area of hypodensity at the pancreatic head measuring 3.1 cm transverse by 2.4 cm craniocaudad suspected to represent developing organizing fluid collection. Spleen: Normal in size without focal abnormality. Adrenals/Urinary Tract: Adrenal glands show stable 14 mm left adrenal nodule and 17 mm right adrenal nodule. Subcentimeter hypodensities within the left kidney too small to further  characterize. No hydronephrosis. The bladder is normal Stomach/Bowel: Small amount of hyperdense material in the stomach. Wall thickening and inflammatory change of the duodenum. No dilated small bowel. No colon wall thickening. Negative appendix. Vascular/Lymphatic: Nonaneurysmal aorta. No suspicious adenopathy. Normal enhancement of the portal vein. Slightly narrowed appearance of the splenic and superior mesenteric veins near the confluence though without thrombus. Reproductive: Prostate is unremarkable. Other: No free air.  Free fluid in the pelvis and upper abdomen Musculoskeletal: No acute or significant osseous findings. IMPRESSION: 1. Findings consistent with acute pancreatitis. Hypoenhancing collection at the pancreatic head measuring up to 3.1 cm, suspect for developing organized fluid collection. Narrowed appearance of the splenic vein and superior mesenteric vein near the confluence but without definitive thrombus at this time. 2. Wall thickening and inflammatory change involving duodenum consistent with duodenitis and reactive inflammation from the pancreas 3. Small amount of free fluid in the abdomen and pelvis. 4. Bilateral adrenal gland nodules probably adenomas. Electronically Signed   By: Jasmine Pang M.D.   On: 02/17/2020 20:09    ____________________________________________  PROCEDURES   Procedure(s) performed (including Critical Care):  Procedures  ____________________________________________  INITIAL IMPRESSION / MDM / ASSESSMENT AND PLAN / ED COURSE  As part of my medical decision making, I reviewed the following data within the electronic MEDICAL RECORD NUMBER Nursing notes reviewed and incorporated, Old chart reviewed, Notes from prior ED visits, and [place] Controlled Substance Database       *Mario Beck was evaluated in Emergency Department on 02/18/2020 for the symptoms described in the history of present illness. He was evaluated in the context of the global COVID-19 pandemic,  which necessitated consideration that the patient might be at risk for infection with the SARS-CoV-2 virus that causes COVID-19. Institutional protocols and algorithms that pertain to the evaluation of patients at risk for COVID-19 are in a state of rapid change based on information released by regulatory bodies including the CDC and federal and state organizations. These policies and algorithms were followed during the patient's care in the ED.  Some ED evaluations and interventions may be delayed as a result of limited staffing during the pandemic.*     Medical Decision Making:  39 yo M here with recurrent pancreatitis. Pt was just seen yesterday evening  for this with CT c/w acute pancreatitis, recommended admission but left AMA. He now returns desiring admission 2/2 inability to eat or drink. He is non-toxic but dehydrated on exam. Abdomen is w/o guarding or rebound. No fever or signs of infected necrosis. Will admit for IVF, management. Repeat labs sent.  ____________________________________________  FINAL CLINICAL IMPRESSION(S) / ED DIAGNOSES  Final diagnoses:  Alcohol-induced acute pancreatitis without infection or necrosis     MEDICATIONS GIVEN DURING THIS VISIT:  Medications  pantoprazole (PROTONIX) injection 40 mg (has no administration in time range)  lactated ringers bolus 1,000 mL (1,000 mLs Intravenous New Bag/Given 02/18/20 0021)  ondansetron (ZOFRAN) injection 4 mg (4 mg Intravenous Given 02/18/20 0020)  HYDROmorphone (DILAUDID) injection 0.5 mg (0.5 mg Intravenous Given 02/18/20 0020)     ED Discharge Orders    None       Note:  This document was prepared using Dragon voice recognition software and may include unintentional dictation errors.   Duffy Bruce, MD 02/18/20 [phone]

## 2020-02-17 NOTE — ED Notes (Addendum)
Pt presents to the ED for centralized abdominal pain for 4 days. Also c/o nausea with one episode of vomiting yesterday. Pt drinks daily. Last drink was yesterday. Pt states he still has his appendix and gallbladder. Was seen at ED in [place] and states all they did was give him morphine and that just made him go to sleep. Denies genitourinary symptoms. Pt is A&Ox4 and NAD at this time.

## 2020-02-17 NOTE — ED Provider Notes (Addendum)
[facility] Emergency Department Provider Note  ____________________________________________   First MD Initiated Contact with Patient 02/17/20 1839     (approximate)  I have reviewed the triage vital signs and the nursing notes.  History  Chief Complaint Abdominal Pain    HPI Mario Beck is a 39 y.o. male with history of HTN, asthma alcohol abuse, alcohol induced pancreatitis, who presents to the emergency department with concern for recurrent pancreatitis.  Patient states for the last several days, up to the last week, he has had epigastric and central abdominal pain.  Describes it as aching, similar to prior episodes of pancreatitis. 10/10 in severity. No radiation. No alleviating/aggravating components. Constant since onset. Associated with nausea and decreased by mouth intake.  Has had about 1 episode of nonbloody vomiting daily since onset of pain.  Also reports associated diarrhea.  Seen at outside hospital ED yesterday, labs notable for elevated lipase (576).  Treated symptomatically and discharged with prescription for morphine, but states all this did was make him sleep and did not relieve his pain.  Says he often feels relief with gabapentin.  States he drinks about a pint and a half of liquor every other day.  Does report feeling shaky/withdrawal type symptoms with alcohol cessation, but denies any history of complex withdrawals or withdrawal seizures.   Past Medical Hx Past Medical History:  Diagnosis Date  . Alcohol abuse   . Asthma   . Depression   . Hypertension     Problem List There are no problems to display for this patient.   Past Surgical Hx Past Surgical History:  Procedure Laterality Date  . FRACTURE SURGERY     Left leg as a kid    Medications Prior to Admission medications   Medication Sig Start Date End Date Taking? Authorizing Provider  amLODipine (NORVASC) 10 MG tablet Take 1 tablet (10 mg total) by mouth daily. 11/26/19    Khatri, Hina, PA-C  dicyclomine (BENTYL) 20 MG tablet Take 1 tablet (20 mg total) by mouth 2 (two) times daily. 11/26/19   Khatri, Hina, PA-C  esomeprazole (NEXIUM) 40 MG capsule Take 1 capsule (40 mg total) by mouth 2 (two) times daily before a meal. 07/18/19   Dartha Lodge, PA-C  gabapentin (NEURONTIN) 300 MG capsule Take 300 mg by mouth 3 (three) times daily. 08/06/19   [provider]  mirtazapine (REMERON) 15 MG tablet Take 1 tablet (15 mg total) by mouth at bedtime. 11/26/19   Khatri, Hina, PA-C  ondansetron (ZOFRAN ODT) 4 MG disintegrating tablet Take 1 tablet (4 mg total) by mouth every 8 (eight) hours as needed for nausea or vomiting. 11/26/19   Khatri, Hina, PA-C  ondansetron (ZOFRAN ODT) 4 MG disintegrating tablet Take 1 tablet (4 mg total) by mouth every 8 (eight) hours as needed. 02/10/20   Horton, Mayer Masker, MD  ondansetron (ZOFRAN) 4 MG tablet Take 1 tablet (4 mg total) by mouth every 8 (eight) hours as needed for nausea or vomiting. 10/04/19   Arthor Captain, PA-C  oxyCODONE-acetaminophen (PERCOCET/ROXICET) 5-325 MG tablet Take 1 tablet by mouth every 4 (four) hours as needed. 11/27/19   Tegeler, Canary Brim, MD  sucralfate (CARAFATE) 1 g tablet Take 1 tablet (1 g total) by mouth 4 (four) times daily -  with meals and at bedtime. 02/10/20   Horton, Mayer Masker, MD    Allergies Lisinopril and Shellfish allergy  Family Hx Family History  Problem Relation Age of Onset  . Colon cancer  Neg Hx   . Esophageal cancer Neg Hx     Social Hx Social History   Tobacco Use  . Smoking status: Current Every Day Smoker    Packs/day: 1.00    Types: Cigarettes  . Smokeless tobacco: Current User    Types: Snuff  . Tobacco comment: Does pouches as welll   Substance Use Topics  . Alcohol use: Yes    Comment: states "1 drink a day"  . Drug use: No     Review of Systems  Constitutional: Negative for fever. Negative for chills. Eyes: Negative for visual changes. ENT: Negative  for sore throat. Cardiovascular: Negative for chest pain. Respiratory: Negative for shortness of breath. Gastrointestinal: Positive for nausea, vomiting, loose stool, abdominal pain. Genitourinary: Negative for dysuria. Musculoskeletal: Negative for leg swelling. Skin: Negative for rash. Neurological: Negative for headaches.   Physical Exam  Vital Signs: ED Triage Vitals  Enc Vitals Group     BP 02/17/20 1802 (!) 144/109     Pulse Rate 02/17/20 1802 (!) 134     Resp 02/17/20 1802 20     Temp 02/17/20 1802 97.9 F (36.6 C)     Temp Source 02/17/20 1802 Oral     SpO2 02/17/20 1802 98 %     Weight 02/17/20 1803 185 lb (83.9 kg)     Height 02/17/20 1803 6\' 3"  (1.905 m)     Head Circumference --      Peak Flow --      Pain Score 02/17/20 1803 10     Pain Loc --      Pain Edu? --      Excl. in [place]? --     Constitutional: Alert and oriented.  Resting, sleeping comfortably on exam.  Awakens easily to voice.  Mumbles when speaking. Head: Normocephalic. Atraumatic. Eyes: Conjunctivae clear. Sclera anicteric. Pupils equal and symmetric. Nose: No masses or lesions. No congestion or rhinorrhea. Mouth/Throat: Wearing mask.  Neck: No stridor. Trachea midline.  Cardiovascular: Tachycardic, regular rhythm. Extremities well perfused. Respiratory: Normal respiratory effort.  Lungs CTAB. Gastrointestinal: Soft. Non-distended.  TTP in epigastrium and centrally, but no rebound, guarding, or rigidity. Genitourinary: Deferred. Musculoskeletal: No lower extremity edema. No deformities. Neurologic:  Normal speech and language. No gross focal or lateralizing neurologic deficits are appreciated.  Skin: Skin is warm, dry and intact. No rash noted. Psychiatric: Mood and affect are appropriate for situation.  EKG  Personally reviewed and interpreted by myself.   Date: 02/17/20 Time: 1810 Rate: 122 Rhythm: sinus Axis: normal Intervals: WNL Sinus tachycardia No  STEMI    Radiology  Personally reviewed available imaging myself.   CT A/P - IMPRESSION:  1. Findings consistent with acute pancreatitis. Hypoenhancing  collection at the pancreatic head measuring up to 3.1 cm, suspect  for developing organized fluid collection. Narrowed appearance of  the splenic vein and superior mesenteric vein near the confluence  but without definitive thrombus at this time.  2. Wall thickening and inflammatory change involving duodenum  consistent with duodenitis and reactive inflammation from the  pancreas  3. Small amount of free fluid in the abdomen and pelvis.  4. Bilateral adrenal gland nodules probably adenomas.    Procedures  Procedure(s) performed (including critical care):  Procedures   Initial Impression / Assessment and Plan / MDM / ED Course  39 y.o. male who presents to the ED for epigastric abdominal pain, nausea, vomiting, loose stools in the setting of alcohol use.  Symptoms consistent with prior episodes of alcohol  induced pancreatitis.  Ddx: alcohol induced pancreatitis, complicated pancreatitis, cholecystitis, alcoholic gastritis, biliary colic  Will plan for labs, IV fluids and symptom control, imaging.  Vital signs on arrival notable for tachycardia, which may be related to dehydration/pain in the setting of pancreatitis, however given his drinking history, also consider some element of withdrawal.  BP does not demonstrate significant hypertension, and he does not exhibit any tremors on exam so feel this is less likely at this time, however we will continue to monitor closely.  Labs reveal leukocytosis to 19 (increased from 11.5 yesterday on Care Everywhere review). Lipase elevated at 162, improved from 576 yesterday. Normal bilirubin and LFTs, therefore less likely related to biliary stone. HS troponin minimally elevated at 19, EKG w/o acute ischemia, likely 2/2 dehydration, stress. Will obtain delta.   CT imaging c/w acute  pancreatitis. Does have findings suspicious for developing organized fluid collection as well as likely reactive duodenitis. He does report feeling improved after medications, HR improving with fluids. Dicussed admission given developing CT findings and rise in leukocytosis. At this time he says he is feeling much improved and would like to be discharged. He understands the possibility of worsening/complicated pancreatitis (especially given his lab and imaging findings) and the possibility of worsening severity of his illness, including infection and/or more systemic illness. He still wishes to be discharged and does not desire admission. He demonstrates capacity and is not intoxicated. He states he will return immediately if he should feel worse, including development of worsening pain, nausea, vomiting, or difficulty tolerating PO.  His partner is at bedside and confirms she will bring him back to the emergency department immediately should this recur.  He agrees to a delta troponin, and as long as stable/improving, and passes PO challenge will plan for discharge per his request, with Rx for further symptom control.  Discussed extremly strict return precautions with patient and partner.    _______________________________   As part of my medical decision making I have reviewed available labs, radiology tests, reviewed old records/performed chart review.    Final Clinical Impression(s) / ED Diagnosis  Final diagnoses:  Alcohol-induced acute pancreatitis, unspecified complication status       Note:  This document was prepared using Dragon voice recognition software and may include unintentional dictation errors.     Miguel Aschoff., MD 02/17/20 2114

## 2020-02-18 DIAGNOSIS — F1020 Alcohol dependence, uncomplicated: Secondary | ICD-10-CM

## 2020-02-18 DIAGNOSIS — K852 Alcohol induced acute pancreatitis without necrosis or infection: Secondary | ICD-10-CM | POA: Diagnosis not present

## 2020-02-18 DIAGNOSIS — K859 Acute pancreatitis without necrosis or infection, unspecified: Secondary | ICD-10-CM | POA: Diagnosis present

## 2020-02-18 DIAGNOSIS — I10 Essential (primary) hypertension: Secondary | ICD-10-CM

## 2020-02-18 LAB — CBC WITH DIFFERENTIAL/PLATELET
Abs Immature Granulocytes: 0.08 10*3/uL — ABNORMAL HIGH (ref 0.00–0.07)
Basophils Absolute: 0 10*3/uL (ref 0.0–0.1)
Basophils Relative: 0 %
Eosinophils Absolute: 0 10*3/uL (ref 0.0–0.5)
Eosinophils Relative: 0 %
HCT: 38.6 % — ABNORMAL LOW (ref 39.0–52.0)
Hemoglobin: 13.8 g/dL (ref 13.0–17.0)
Immature Granulocytes: 1 %
Lymphocytes Relative: 5 %
Lymphs Abs: 0.8 10*3/uL (ref 0.7–4.0)
MCH: 28.7 pg (ref 26.0–34.0)
MCHC: 35.8 g/dL (ref 30.0–36.0)
MCV: 80.2 fL (ref 80.0–100.0)
Monocytes Absolute: 0.9 10*3/uL (ref 0.1–1.0)
Monocytes Relative: 5 %
Neutro Abs: 14.9 10*3/uL — ABNORMAL HIGH (ref 1.7–7.7)
Neutrophils Relative %: 89 %
Platelets: 150 10*3/uL (ref 150–400)
RBC: 4.81 MIL/uL (ref 4.22–5.81)
RDW: 13.6 % (ref 11.5–15.5)
WBC: 16.7 10*3/uL — ABNORMAL HIGH (ref 4.0–10.5)
nRBC: 0 % (ref 0.0–0.2)

## 2020-02-18 LAB — COMPREHENSIVE METABOLIC PANEL
ALT: 26 U/L (ref 0–44)
AST: 22 U/L (ref 15–41)
Albumin: 3.7 g/dL (ref 3.5–5.0)
Alkaline Phosphatase: 58 U/L (ref 38–126)
Anion gap: 9 (ref 5–15)
BUN: 10 mg/dL (ref 6–20)
CO2: 27 mmol/L (ref 22–32)
Calcium: 8.6 mg/dL — ABNORMAL LOW (ref 8.9–10.3)
Chloride: 100 mmol/L (ref 98–111)
Creatinine, Ser: 0.7 mg/dL (ref 0.61–1.24)
GFR calc Af Amer: >60 mL/min (ref >60)
GFR calc non Af Amer: >60 mL/min (ref >60)
Glucose, Bld: 120 mg/dL — ABNORMAL HIGH (ref 70–99)
Potassium: 3.4 mmol/L — ABNORMAL LOW (ref 3.5–5.1)
Sodium: 136 mmol/L (ref 135–145)
Total Bilirubin: 0.9 mg/dL (ref 0.3–1.2)
Total Protein: 6.7 g/dL (ref 6.5–8.1)

## 2020-02-18 LAB — MAGNESIUM: Magnesium: 2 mg/dL (ref 1.7–2.4)

## 2020-02-18 LAB — RESPIRATORY PANEL BY RT PCR (FLU A&B, COVID)
Influenza A by PCR: NEGATIVE
Influenza B by PCR: NEGATIVE
SARS Coronavirus 2 by RT PCR: NEGATIVE

## 2020-02-18 LAB — HIV ANTIBODY (ROUTINE TESTING W REFLEX): HIV Screen 4th Generation wRfx: NONREACTIVE

## 2020-02-18 LAB — PROTIME-INR
INR: 1 (ref 0.8–1.2)
Prothrombin Time: 12.4 seconds (ref 11.4–15.2)

## 2020-02-18 LAB — LACTATE DEHYDROGENASE: LDH: 170 U/L (ref 98–192)

## 2020-02-18 LAB — LIPASE, BLOOD: Lipase: 94 U/L — ABNORMAL HIGH (ref 11–51)

## 2020-02-18 LAB — PHOSPHORUS: Phosphorus: 4.1 mg/dL (ref 2.5–4.6)

## 2020-02-18 MED ORDER — MIRTAZAPINE 15 MG PO TABS
15.0000 mg | ORAL_TABLET | Freq: Every day | ORAL | Status: DC
  Administered 2020-02-18: 15 mg via ORAL
  Filled 2020-02-18: qty 1

## 2020-02-18 MED ORDER — PANTOPRAZOLE SODIUM 40 MG IV SOLR
40.0000 mg | Freq: Once | INTRAVENOUS | Status: AC
  Administered 2020-02-18: 40 mg via INTRAVENOUS
  Filled 2020-02-18: qty 40

## 2020-02-18 MED ORDER — ADULT MULTIVITAMIN W/MINERALS CH
1.0000 | ORAL_TABLET | Freq: Every day | ORAL | Status: DC
  Administered 2020-02-18 – 2020-02-19 (×2): 1 via ORAL
  Filled 2020-02-18 (×2): qty 1

## 2020-02-18 MED ORDER — NICOTINE 21 MG/24HR TD PT24
21.0000 mg | MEDICATED_PATCH | Freq: Every day | TRANSDERMAL | Status: DC
  Administered 2020-02-18: 21 mg via TRANSDERMAL
  Filled 2020-02-18: qty 1

## 2020-02-18 MED ORDER — ONDANSETRON HCL 4 MG PO TABS: 4.0000 mg | ORAL_TABLET | Freq: Four times a day (QID) | ORAL | Status: DC | PRN

## 2020-02-18 MED ORDER — LORAZEPAM 2 MG/ML IJ SOLN
1.0000 mg | INTRAMUSCULAR | Status: DC | PRN
  Administered 2020-02-18 (×2): 1 mg via INTRAVENOUS
  Administered 2020-02-19: 2 mg via INTRAVENOUS
  Filled 2020-02-18 (×3): qty 1

## 2020-02-18 MED ORDER — ONDANSETRON HCL 4 MG/2ML IJ SOLN
4.0000 mg | Freq: Four times a day (QID) | INTRAMUSCULAR | Status: DC | PRN
  Administered 2020-02-19: 4 mg via INTRAVENOUS
  Filled 2020-02-18: qty 2

## 2020-02-18 MED ORDER — LABETALOL HCL 5 MG/ML IV SOLN
10.0000 mg | INTRAVENOUS | Status: DC | PRN
  Administered 2020-02-18: 04:00:00 10 mg via INTRAVENOUS
  Filled 2020-02-18: qty 4

## 2020-02-18 MED ORDER — HYDROMORPHONE HCL 1 MG/ML IJ SOLN
0.5000 mg | INTRAMUSCULAR | Status: DC | PRN
  Administered 2020-02-18 (×2): 1 mg via INTRAVENOUS
  Filled 2020-02-18 (×2): qty 1

## 2020-02-18 MED ORDER — MIRTAZAPINE 15 MG PO TABS: 15.0000 mg | ORAL_TABLET | Freq: Every day | ORAL | Status: DC

## 2020-02-18 MED ORDER — HYDROMORPHONE HCL 1 MG/ML IJ SOLN
0.5000 mg | Freq: Once | INTRAMUSCULAR | Status: AC
  Administered 2020-02-18: 0.5 mg via INTRAVENOUS
  Filled 2020-02-18: qty 1

## 2020-02-18 MED ORDER — LORAZEPAM 2 MG/ML IJ SOLN
0.0000 mg | Freq: Four times a day (QID) | INTRAMUSCULAR | Status: DC
  Administered 2020-02-18 – 2020-02-19 (×2): 2 mg via INTRAVENOUS
  Filled 2020-02-18 (×2): qty 1

## 2020-02-18 MED ORDER — THIAMINE HCL 100 MG/ML IJ SOLN: 100.0000 mg | Freq: Every day | INTRAMUSCULAR | Status: DC

## 2020-02-18 MED ORDER — LORAZEPAM 1 MG PO TABS
1.0000 mg | ORAL_TABLET | ORAL | Status: DC | PRN
  Administered 2020-02-19: 2 mg via ORAL
  Filled 2020-02-18: qty 2

## 2020-02-18 MED ORDER — POTASSIUM CHLORIDE IN NACL 40-0.9 MEQ/L-% IV SOLN
INTRAVENOUS | Status: DC
  Administered 2020-02-18 – 2020-02-19 (×4): 125 mL/h via INTRAVENOUS
  Filled 2020-02-18 (×8): qty 1000

## 2020-02-18 MED ORDER — HYDROMORPHONE HCL 1 MG/ML IJ SOLN
0.5000 mg | INTRAMUSCULAR | Status: DC | PRN
  Administered 2020-02-18 – 2020-02-19 (×4): 0.5 mg via INTRAVENOUS
  Filled 2020-02-18 (×4): qty 1

## 2020-02-18 MED ORDER — LORAZEPAM 2 MG/ML IJ SOLN: 0.0000 mg | Freq: Two times a day (BID) | INTRAMUSCULAR | Status: DC

## 2020-02-18 MED ORDER — ENOXAPARIN SODIUM 40 MG/0.4ML ~~LOC~~ SOLN
40.0000 mg | SUBCUTANEOUS | Status: DC
  Administered 2020-02-19: 40 mg via SUBCUTANEOUS
  Filled 2020-02-18 (×2): qty 0.4

## 2020-02-18 MED ORDER — THIAMINE HCL 100 MG PO TABS
100.0000 mg | ORAL_TABLET | Freq: Every day | ORAL | Status: DC
  Administered 2020-02-18 – 2020-02-19 (×2): 100 mg via ORAL
  Filled 2020-02-18 (×2): qty 1

## 2020-02-18 MED ORDER — FOLIC ACID 1 MG PO TABS
1.0000 mg | ORAL_TABLET | Freq: Every day | ORAL | Status: DC
  Administered 2020-02-18 – 2020-02-19 (×2): 1 mg via ORAL
  Filled 2020-02-18 (×2): qty 1

## 2020-02-18 NOTE — H&P (Signed)
History and Physical    Mario Beck JIR:[id number] DOB: 01-May-1981 DOA: 02/17/2020  PCP: Patient, No Pcp Per   Patient coming from: Home I have personally briefly reviewed patient's old medical records in [place]  Chief Complaint: Nausea vomiting abdominal pain  HPI: Mario Beck is a 39 y.o. male with medical history significant for alcohol use disorder, drinks up to a pint daily, hypertension and history of pancreatitis in the past who presents to the emergency room for the second time in 24 hours with intractable nausea and vomiting.  On his first visit to the ER he was treated and discharged but vomiting continued and he returned to the emergency room.  His abdominal pain is in the mid abdominal area radiating from the epigastrium and then to suprapubic area of severe intensity..  Emesis is nonbloody and nonbilious.  He denies chest pains or shortness of breath or change in bowel habits  ED Course: On arrival in the emergency room he was tachycardic with otherwise unremarkable vitals.  Lipase 162, potassium 3.1.  WBC 16,000.  Covid and flu test negative CT abdomen and pelvis shows findings consistent with acute pancreatitis with developing organized fluid collection in the pancreatic head measuring up to 3.1 cm.  Hospitalist consulted for admission  Review of Systems: As per HPI otherwise 10 point review of systems negative.    Past Medical History:  Diagnosis Date  . Alcohol abuse   . Asthma   . Depression   . Hypertension     Past Surgical History:  Procedure Laterality Date  . FRACTURE SURGERY     Left leg as a kid     reports that he has been smoking cigarettes. He has been smoking about 1.00 pack per day. His smokeless tobacco use includes snuff. He reports current alcohol use. He reports that he does not use drugs.  Allergies  Allergen Reactions  . Lisinopril Swelling    Angioedema   . Shellfish Allergy     Family History  Problem Relation Age of Onset  .  Colon cancer Neg Hx   . Esophageal cancer Neg Hx      Prior to Admission medications   Medication Sig Start Date End Date Taking? Authorizing Provider  amLODipine (NORVASC) 10 MG tablet Take 1 tablet (10 mg total) by mouth daily. 11/26/19   Khatri, Hina, PA-C  dicyclomine (BENTYL) 20 MG tablet Take 1 tablet (20 mg total) by mouth 2 (two) times daily. 11/26/19   Khatri, Hina, PA-C  esomeprazole (NEXIUM) 40 MG capsule Take 1 capsule (40 mg total) by mouth 2 (two) times daily before a meal. 07/18/19   Jacqlyn Larsen, PA-C  gabapentin (NEURONTIN) 100 MG capsule Take 3 capsules (300 mg total) by mouth 3 (three) times daily for 7 days. 02/17/20 02/24/20  Lilia Pro., MD  gabapentin (NEURONTIN) 300 MG capsule Take 300 mg by mouth 3 (three) times daily. 08/06/19   [provider]  ketorolac (TORADOL) 10 MG tablet Take 1 tablet (10 mg total) by mouth every 6 (six) hours as needed for up to 5 days. 02/17/20 02/22/20  Lilia Pro., MD  mirtazapine (REMERON) 15 MG tablet Take 1 tablet (15 mg total) by mouth at bedtime. 11/26/19   Khatri, Hina, PA-C  ondansetron (ZOFRAN ODT) 4 MG disintegrating tablet Take 1 tablet (4 mg total) by mouth every 8 (eight) hours as needed for nausea or vomiting. 11/26/19   Khatri, Hina, PA-C  ondansetron (ZOFRAN ODT) 4 MG disintegrating tablet  Take 1 tablet (4 mg total) by mouth every 8 (eight) hours as needed. 02/10/20   Horton, Mayer Masker, MD  ondansetron (ZOFRAN) 4 MG tablet Take 1 tablet (4 mg total) by mouth every 8 (eight) hours as needed for nausea or vomiting. 10/04/19   Arthor Captain, PA-C  ondansetron (ZOFRAN) 4 MG tablet Take 1 tablet (4 mg total) by mouth every 8 (eight) hours as needed for up to 7 days for nausea or vomiting. 02/17/20 02/24/20  Miguel Aschoff., MD  oxyCODONE-acetaminophen (PERCOCET/ROXICET) 5-325 MG tablet Take 1 tablet by mouth every 4 (four) hours as needed. 11/27/19   Tegeler, Canary Brim, MD  sucralfate (CARAFATE) 1 g tablet Take 1 tablet (1 g  total) by mouth 4 (four) times daily -  with meals and at bedtime. 02/10/20   Shon Baton, MD    Physical Exam: Vitals:   02/17/20 2305 02/17/20 2315  BP:  (!) 149/99  Pulse:  (!) 114  Resp:  17  Temp:  97.9 F (36.6 C)  TempSrc:  Oral  SpO2:  99%  Weight: 83.9 kg   Height: 6\' 3"  (1.905 m)      Vitals:   02/17/20 2305 02/17/20 2315  BP:  (!) 149/99  Pulse:  (!) 114  Resp:  17  Temp:  97.9 F (36.6 C)  TempSrc:  Oral  SpO2:  99%  Weight: 83.9 kg   Height: 6\' 3"  (1.905 m)     Constitutional: Alert and awake, oriented x3, not in any acute distress. Eyes: PERLA, EOMI, irises appear normal, anicteric sclera,  ENMT: external ears and nose appear normal, normal hearing             Lips appears normal, oropharynx mucosa, tongue, posterior pharynx appear normal  Neck: neck appears normal, no masses, normal ROM, no thyromegaly, no JVD  CVS: S1-S2 clear, no murmur rubs or gallops,  , no carotid bruits, pedal pulses palpable, No LE edema Respiratory:  clear to auscultation bilaterally, no wheezing, rales or rhonchi. Respiratory effort normal. No accessory muscle use.  Abdomen: Tender epigastrium and periumbilical area, nondistended, normal bowel sounds, no hepatosplenomegaly, no hernias Musculoskeletal: : no cyanosis, clubbing , no contractures or atrophy Neuro: Cranial nerves II-XII intact, sensation, reflexes normal, strength Psych: judgement and insight appear normal, stable mood and affect,  Skin: no rashes or lesions or ulcers, no induration or nodules   Labs on Admission: I have personally reviewed following labs and imaging studies  CBC: Recent Labs  Lab 02/17/20 1805  WBC 19.0*  HGB 15.7  HCT 45.0  MCV 80.8  PLT 173   Basic Metabolic Panel: Recent Labs  Lab 02/17/20 1805  NA 138  K 3.1*  CL 98  CO2 25  GLUCOSE 136*  BUN 13  CREATININE 0.77  CALCIUM 9.2   GFR: Estimated Creatinine Clearance: 150 mL/min (by C-G formula based on SCr of 0.77  mg/dL). Liver Function Tests: Recent Labs  Lab 02/17/20 1805  AST 29  ALT 33  ALKPHOS 70  BILITOT 1.1  PROT 7.9  ALBUMIN 4.4   Recent Labs  Lab 02/17/20 1805  LIPASE 162*   No results for input(s): AMMONIA in the last 168 hours. Coagulation Profile: No results for input(s): INR, PROTIME in the last 168 hours. Cardiac Enzymes: No results for input(s): CKTOTAL, CKMB, CKMBINDEX, TROPONINI in the last 168 hours. BNP (last 3 results) No results for input(s): PROBNP in the last 8760 hours. HbA1C: No results for input(s): HGBA1C in the  last 72 hours. CBG: No results for input(s): GLUCAP in the last 168 hours. Lipid Profile: No results for input(s): CHOL, HDL, LDLCALC, TRIG, CHOLHDL, LDLDIRECT in the last 72 hours. Thyroid Function Tests: No results for input(s): TSH, T4TOTAL, FREET4, T3FREE, THYROIDAB in the last 72 hours. Anemia Panel: No results for input(s): VITAMINB12, FOLATE, FERRITIN, TIBC, IRON, RETICCTPCT in the last 72 hours. Urine analysis:    Component Value Date/Time   COLORURINE AMBER (A) 02/17/2020 1805   APPEARANCEUR HAZY (A) 02/17/2020 1805   LABSPEC 1.026 02/17/2020 1805   PHURINE 5.0 02/17/2020 1805   GLUCOSEU NEGATIVE 02/17/2020 1805   HGBUR NEGATIVE 02/17/2020 1805   BILIRUBINUR NEGATIVE 02/17/2020 1805   KETONESUR NEGATIVE 02/17/2020 1805   PROTEINUR 100 (A) 02/17/2020 1805   NITRITE NEGATIVE 02/17/2020 1805   LEUKOCYTESUR NEGATIVE 02/17/2020 1805    Radiological Exams on Admission: CT Abdomen Pelvis W Contrast  Result Date: 02/17/2020 CLINICAL DATA:  Abdomen pain history of pancreatitis EXAM: CT ABDOMEN AND PELVIS WITH CONTRAST TECHNIQUE: Multidetector CT imaging of the abdomen and pelvis was performed using the standard protocol following bolus administration of intravenous contrast. CONTRAST:  OMNIPAQUE IOHEXOL 300 MG/ML  SOLN COMPARISON:  Chest CT 02/01/2018 FINDINGS: Lower chest: Mild gynecomastia. No consolidation or pleural effusion. Mild  cardiomegaly. Hepatobiliary: No focal liver abnormality is seen. No gallstones, gallbladder wall thickening, or biliary dilatation. Pancreas: Moderate diffuse inflammatory changes and fluid consistent with acute pancreatitis. Relative area of hypodensity at the pancreatic head measuring 3.1 cm transverse by 2.4 cm craniocaudad suspected to represent developing organizing fluid collection. Spleen: Normal in size without focal abnormality. Adrenals/Urinary Tract: Adrenal glands show stable 14 mm left adrenal nodule and 17 mm right adrenal nodule. Subcentimeter hypodensities within the left kidney too small to further characterize. No hydronephrosis. The bladder is normal Stomach/Bowel: Small amount of hyperdense material in the stomach. Wall thickening and inflammatory change of the duodenum. No dilated small bowel. No colon wall thickening. Negative appendix. Vascular/Lymphatic: Nonaneurysmal aorta. No suspicious adenopathy. Normal enhancement of the portal vein. Slightly narrowed appearance of the splenic and superior mesenteric veins near the confluence though without thrombus. Reproductive: Prostate is unremarkable. Other: No free air.  Free fluid in the pelvis and upper abdomen Musculoskeletal: No acute or significant osseous findings. IMPRESSION: 1. Findings consistent with acute pancreatitis. Hypoenhancing collection at the pancreatic head measuring up to 3.1 cm, suspect for developing organized fluid collection. Narrowed appearance of the splenic vein and superior mesenteric vein near the confluence but without definitive thrombus at this time. 2. Wall thickening and inflammatory change involving duodenum consistent with duodenitis and reactive inflammation from the pancreas 3. Small amount of free fluid in the abdomen and pelvis. 4. Bilateral adrenal gland nodules probably adenomas. Electronically Signed   By: Jasmine Pang M.D.   On: 02/17/2020 20:09    EKG: Independently reviewed.    Assessment/Plan Active Problems:    Acute alcoholic pancreatitis   Alcohol use disorder, moderate, dependence (HCC) -Keep n.p.o. for now -IV hydration, IV antiemetics, IV narcotic pain medication -Counseled on abstinence from alcohol. Drinks about a pint daily -Ciwa withdrawal protocol  Essential hypertension -Resume amlodipine when tolerating    DVT prophylaxis: Lovenox  Code Status: full code  Family Communication:  none  Disposition Plan: Back to previous home environment Consults called: none  Status:obs    Andris Baumann MD [facility]     02/18/2020, 1:15 AM

## 2020-02-18 NOTE — Progress Notes (Signed)
Briefly patient is a 39 year old man who was admitted earlier today for alcoholic pancreatitis.  This morning he says he feels somewhat better with decreased pain.  Notes he has had no further vomiting because "I have not eaten anything".  He states he like to eat something.  He also wants to go out to smoke.  Physical exam is notable for soft abdomen with normal bowel sounds.  He does have some focal tenderness to light palpation with some focal rebound in the right upper quadrant.  Murphy sign is negative.  Agree with treatment as initiated earlier today with bowel rest, alcohol cessation, pain management and fluid resuscitation.  Patient is also on CIWA protocol though he denies history of complicated withdrawal in the past.

## 2020-02-18 NOTE — TOC Initial Note (Signed)
Transition of Care [facility]) - Initial/Assessment Note    Patient Details  Name: Mario Beck MRN: [id number] Date of Birth: [birth year]  Transition of Care [facility]) CM/SW Contact:    Trenton Founds, RN Phone Number: 02/18/2020, 12:41 PM  Clinical Narrative:    RNCM assessed patient at bedside, discussed role and reason CM consult was placed. Patient reports that the only thing he needs is something to eat and drink. He reports that he has been given Substance Abuse resources before but will take them again. He denies any other needs. Attempted to educate patient as to reasoning behind nothing to eat or drink and this could cause him more pain but patient reports he has had this before and been able to drink at that time. RNCM will follow for any further needs.             Expected Discharge Plan: Home/Self Care Barriers to Discharge: Continued Medical Work up   Patient Goals and CMS Choice        Expected Discharge Plan and Services Expected Discharge Plan: Home/Self Care       Living arrangements for the past 2 months: Apartment                                      Prior Living Arrangements/Services Living arrangements for the past 2 months: Apartment Lives with:: Significant Other Patient language and need for interpreter reviewed:: Yes Do you feel safe going back to the place where you live?: Yes      Need for Family Participation in Patient Care: Yes (Comment) Care giver support system in place?: Yes (comment)   Criminal Activity/Legal Involvement Pertinent to Current Situation/Hospitalization: No - Comment as needed  Activities of Daily Living Home Assistive Devices/Equipment: None ADL Screening (condition at time of admission) Patient's cognitive ability adequate to safely complete daily activities?: Yes Is the patient deaf or have difficulty hearing?: No Does the patient have difficulty seeing, even when wearing glasses/contacts?: No Does the patient have  difficulty concentrating, remembering, or making decisions?: No Patient able to express need for assistance with ADLs?: Yes Does the patient have difficulty dressing or bathing?: No Independently performs ADLs?: Yes (appropriate for developmental age) Does the patient have difficulty walking or climbing stairs?: No Weakness of Legs: None Weakness of Arms/Hands: None  Permission Sought/Granted                  Emotional Assessment Appearance:: Appears stated age Attitude/Demeanor/Rapport: Apprehensive Affect (typically observed): Blunt Orientation: : Oriented to Self, Oriented to Place, Oriented to  Time, Oriented to Situation Alcohol / Substance Use: Alcohol Use Psych Involvement: No (comment)  Admission diagnosis:  Acute pancreatitis [K85.90] Alcohol-induced acute pancreatitis without infection or necrosis [K85.20] Patient Active Problem List   Diagnosis Date Noted  . Acute alcoholic pancreatitis 02/18/2020  . HTN (hypertension) 02/18/2020  . Alcohol use disorder, moderate, dependence (HCC) 02/18/2020  . Acute pancreatitis 02/18/2020   PCP:  Patient, No Pcp Per Pharmacy:   [id number] - [place], [place] - [address] [place] [address] Phone: [phone] Fax: [phone]  [facility] - [place], [place] - [address] [place] [address] Phone: [phone] Fax: [phone]  [id number] - [place], [place] - [address] [facility] [address] [place] [address] Phone: [phone] Fax: [phone]     Social Determinants of Health (SDOH) Interventions    Readmission Risk Interventions No flowsheet data found.

## 2020-02-18 NOTE — Plan of Care (Signed)
  Problem: Education: Goal: Knowledge of General Education information will improve Description: Including pain rating scale, medication(s)/side effects and non-pharmacologic comfort measures 02/18/2020 0438 by Simeon Craft, RN Outcome: Progressing 02/18/2020 0438 by Simeon Craft, RN Outcome: Progressing   Problem: Clinical Measurements: Goal: Ability to maintain clinical measurements within normal limits will improve Outcome: Progressing Goal: Will remain free from infection Outcome: Progressing   Problem: Activity: Goal: Risk for activity intolerance will decrease Outcome: Progressing   Problem: Pain Managment: Goal: General experience of comfort will improve Outcome: Progressing   Problem: Safety: Goal: Ability to remain free from injury will improve Outcome: Progressing

## 2020-02-18 NOTE — Progress Notes (Signed)
Not an acute change, heart has been tachy in the low 100's

## 2020-02-18 NOTE — Progress Notes (Signed)
Ch visited with Pt as part of regular rounding. Pt asked if Ch was a doctor. Pt and lady bedside were alarmed with chaplain's visitation; wondering something was wrong with the Pt. Ch assured that visit was part of regular rounding. Pt complained of doctor not having come to see him yet. Pt asked for ginger ale, or ice. Ch went and found RN, and RN told CH that Pt is not allowed to have any fluids. Ch went and checked back again, and Pt asked for ice-chips. Ch found RN again to confirm it was okay, and it was not. Pt is awaiting doctor's visit. Pt and lady bedside agitated at this time.

## 2020-02-19 DIAGNOSIS — F1020 Alcohol dependence, uncomplicated: Secondary | ICD-10-CM

## 2020-02-19 DIAGNOSIS — I159 Secondary hypertension, unspecified: Secondary | ICD-10-CM | POA: Diagnosis not present

## 2020-02-19 DIAGNOSIS — Z716 Tobacco abuse counseling: Secondary | ICD-10-CM | POA: Diagnosis not present

## 2020-02-19 DIAGNOSIS — K852 Alcohol induced acute pancreatitis without necrosis or infection: Secondary | ICD-10-CM | POA: Diagnosis not present

## 2020-02-19 MED ORDER — SERTRALINE HCL 50 MG PO TABS: 100.0000 mg | ORAL_TABLET | Freq: Every day | ORAL | Status: DC

## 2020-02-19 MED ORDER — PANTOPRAZOLE SODIUM 40 MG PO TBEC
40.0000 mg | DELAYED_RELEASE_TABLET | Freq: Every day | ORAL | Status: DC
  Administered 2020-02-19: 11:00:00 40 mg via ORAL
  Filled 2020-02-19: qty 1

## 2020-02-19 MED ORDER — TRAMADOL HCL 50 MG PO TABS: 50.0000 mg | ORAL_TABLET | Freq: Four times a day (QID) | ORAL | Status: DC | PRN

## 2020-02-19 MED ORDER — AMLODIPINE BESYLATE 10 MG PO TABS
10.0000 mg | ORAL_TABLET | Freq: Every day | ORAL | Status: DC
  Administered 2020-02-19: 11:00:00 10 mg via ORAL
  Filled 2020-02-19: qty 1

## 2020-02-19 NOTE — Progress Notes (Signed)
[facility]  - [place] at [facility]   PATIENT NAME: Mario Beck    MR#:  [id number]  DATE OF BIRTH:  05-25-1981  SUBJECTIVE:   Complains of unable to sleep last night. Anxious. Received Ativan earlier according to the RN. Patient feels hungry wants to start something orally.  Drinking significant amount of alcohol on a daily basis. Denies any depression however it has not been taking his antidepressant for several months due to noncompliance for follow-up REVIEW OF SYSTEMS:   Review of Systems  Constitutional: Negative for chills, fever and weight loss.  HENT: Negative for ear discharge, ear pain and nosebleeds.   Eyes: Negative for blurred vision, pain and discharge.  Respiratory: Negative for sputum production, shortness of breath, wheezing and stridor.   Cardiovascular: Negative for chest pain, palpitations, orthopnea and PND.  Gastrointestinal: Positive for abdominal pain. Negative for diarrhea, nausea and vomiting.  Genitourinary: Negative for frequency and urgency.  Musculoskeletal: Negative for back pain and joint pain.  Neurological: Negative for sensory change, speech change, focal weakness and weakness.  Psychiatric/Behavioral: Negative for depression and hallucinations. The patient is nervous/anxious.    Tolerating Diet: Tolerating PT: not needed  DRUG ALLERGIES:   Allergies  Allergen Reactions  . Lisinopril Swelling    Angioedema   . Shellfish Allergy     VITALS:  Blood pressure (!) 159/104, pulse (!) 110, temperature 99 F (37.2 C), temperature source Oral, resp. rate 16, height 6\' 3"  (1.905 m), weight 83.9 kg, SpO2 100 %.  PHYSICAL EXAMINATION:   Physical Exam  GENERAL:  39 y.o.-year-old patient lying in the bed with no acute distress.  EYES: Pupils equal, round, reactive to light and accommodation. No scleral icterus.   HEENT: Head atraumatic, normocephalic. Oropharynx and nasopharynx clear.  NECK:  Supple, no jugular venous  distention. No thyroid enlargement, no tenderness.  LUNGS: Normal breath sounds bilaterally, no wheezing, rales, rhonchi. No use of accessory muscles of respiration.  CARDIOVASCULAR: S1, S2 normal. No murmurs, rubs, or gallops.  ABDOMEN: Soft, mild epigastric tenderness, nondistended. Bowel sounds present. No organomegaly or mass.  EXTREMITIES: No cyanosis, clubbing or edema b/l.    NEUROLOGIC: Cranial nerves II through XII are intact. No focal Motor or sensory deficits b/l.   PSYCHIATRIC:  patient is alert and oriented x 3.  SKIN: No obvious rash, lesion, or ulcer.   LABORATORY PANEL:  CBC Recent Labs  Lab 02/18/20 0055  WBC 16.7*  HGB 13.8  HCT 38.6*  PLT 150    Chemistries  Recent Labs  Lab 02/18/20 0055  NA 136  K 3.4*  CL 100  CO2 27  GLUCOSE 120*  BUN 10  CREATININE 0.70  CALCIUM 8.6*  MG 2.0  AST 22  ALT 26  ALKPHOS 58  BILITOT 0.9   Cardiac Enzymes No results for input(s): TROPONINI in the last 168 hours. RADIOLOGY:  CT Abdomen Pelvis W Contrast  Result Date: 02/17/2020 CLINICAL DATA:  Abdomen pain history of pancreatitis EXAM: CT ABDOMEN AND PELVIS WITH CONTRAST TECHNIQUE: Multidetector CT imaging of the abdomen and pelvis was performed using the standard protocol following bolus administration of intravenous contrast. CONTRAST:  04/18/2020 OMNIPAQUE IOHEXOL 300 MG/ML  SOLN COMPARISON:  Chest CT 02/01/2018 FINDINGS: Lower chest: Mild gynecomastia. No consolidation or pleural effusion. Mild cardiomegaly. Hepatobiliary: No focal liver abnormality is seen. No gallstones, gallbladder wall thickening, or biliary dilatation. Pancreas: Moderate diffuse inflammatory changes and fluid consistent with acute pancreatitis. Relative area of hypodensity at the pancreatic head measuring  3.1 cm transverse by 2.4 cm craniocaudad suspected to represent developing organizing fluid collection. Spleen: Normal in size without focal abnormality. Adrenals/Urinary Tract: Adrenal glands show  stable 14 mm left adrenal nodule and 17 mm right adrenal nodule. Subcentimeter hypodensities within the left kidney too small to further characterize. No hydronephrosis. The bladder is normal Stomach/Bowel: Small amount of hyperdense material in the stomach. Wall thickening and inflammatory change of the duodenum. No dilated small bowel. No colon wall thickening. Negative appendix. Vascular/Lymphatic: Nonaneurysmal aorta. No suspicious adenopathy. Normal enhancement of the portal vein. Slightly narrowed appearance of the splenic and superior mesenteric veins near the confluence though without thrombus. Reproductive: Prostate is unremarkable. Other: No free air.  Free fluid in the pelvis and upper abdomen Musculoskeletal: No acute or significant osseous findings. IMPRESSION: 1. Findings consistent with acute pancreatitis. Hypoenhancing collection at the pancreatic head measuring up to 3.1 cm, suspect for developing organized fluid collection. Narrowed appearance of the splenic vein and superior mesenteric vein near the confluence but without definitive thrombus at this time. 2. Wall thickening and inflammatory change involving duodenum consistent with duodenitis and reactive inflammation from the pancreas 3. Small amount of free fluid in the abdomen and pelvis. 4. Bilateral adrenal gland nodules probably adenomas. Electronically Signed   By: Jasmine Pang M.D.   On: 02/17/2020 20:09   ASSESSMENT AND PLAN:  Quillan Whitter is a 39 y.o. male with medical history significant for alcohol use disorder, drinks up to a pint daily, hypertension and history of pancreatitis in the past who presents to the emergency room for the second time in 24 hours with intractable nausea and vomiting  Acute alcoholic pancreatitis-recurrent    Alcohol use disorder, moderate/, dependence (HCC) -patient feeling better. Requesting to start clear liquid. Lipase trending down 160--94 -IV hydration, IV antiemetics, IV and oral narcotic pain  medication -Counseled on abstinence from alcohol. Drinks about a pint daily -Ciwa withdrawal protocol -- CT scan suggestive of 3.1 cm fluid collection on the head of the pancreas-- patient was informed of the changes noted on CT scan. --No fever, white count trending down, G.I. consultation. Dr. Maximino Greenland to see patient. -- Currently not on any antibiotics. -- Able to tell me any trigger or stressor for his recurrent alcohol abuse. 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 -- TOC for community resources for alcohol/substance abuse  Chronic tobacco abuse -- consult smoking cessation -nicotine patch  History of depression -noncompliant to oral Zoloft. He said he ran out of it several months ago -resume Zoloft. Denies any suicidal or homicidal ideation  Essential hypertension-- uncontrolled due to medication noncompliance -Resume amlodipine  -PRN labetalol -add clonidine if blood pressure remains high    A long conversation with patient in the room. He wanted his girlfriend to step out of the room during the conversation. Discussed the severity of his alcohol abuse and changes noted on the CT scan for his pancreas. He did voice understanding. Talk to him about follow-up with outpatient alcohol rehab programs.   DVT prophylaxis: Lovenox  Code Status: full code  Family Communication:  none  Disposition Plan: Back to previous home environment  Status is: Inpatient  Remains inpatient appropriate because:IV treatments appropriate due to intensity of illness or inability to take PO   Dispo: The patient is from: Home              Anticipated d/c is to: Home              Anticipated d/c  date is: 3 days              Patient currently is not medically stable to d/c. patient continues to score high on the CIWA protocol. He is started on clear liquid today. He still has some abdominal pain with abnormal CT finding. G.I. consultation placed today.  TOTAL TIME TAKING CARE OF THIS PATIENT:  *35 minutes.  >50% time spent on counselling and coordination of care  Note: This dictation was prepared with Dragon dictation along with smaller phrase technology. Any transcriptional errors that result from this process are unintentional.  Fritzi Mandes M.D    [facility]   CC: Primary care physician; Patient, No Pcp PerPatient ID: Mario Beck, male   DOB: 06-Jul-1981, 39 y.o.   MRN: [id number]

## 2020-02-19 NOTE — Consult Note (Signed)
Mario Bouillon, MD [address], [place], [place], [address] [address], [place], [place], [address] Phone: [phone]  Fax: [phone]  Consultation  Referring Provider:     Dr. Allena Katz Primary Care Physician:  Patient, No Pcp Per Reason for Consultation:     Pancreatitis  Date of Admission:  02/17/2020 Date of Consultation:  02/19/2020         HPI:   Mario Beck is a 39 y.o. male who reports previous history of pancreatitis 6 to 7 months ago and he was admitted to [facility] for it.  Describes daily alcohol use, 1 pint of [employer] daily.  Also smokes daily.  3-day history of epigastric abdominal pain associated with nausea but no vomiting.  No altered bowel habits or blood in stool.  Denies any new medications.  Denies any NSAID use.  Past Medical History:  Diagnosis Date  . Alcohol abuse   . Asthma   . Depression   . Hypertension     Past Surgical History:  Procedure Laterality Date  . FRACTURE SURGERY     Left leg as a kid    Prior to Admission medications   Medication Sig Start Date End Date Taking? Authorizing Provider  amLODipine (NORVASC) 10 MG tablet Take 1 tablet (10 mg total) by mouth daily. 11/26/19  Yes Khatri, Hina, PA-C  ondansetron (ZOFRAN) 4 MG tablet Take 1 tablet (4 mg total) by mouth every 8 (eight) hours as needed for up to 7 days for nausea or vomiting. 02/17/20 02/24/20 Yes Miguel Aschoff., MD  sertraline (ZOLOFT) 100 MG tablet Take 1 tablet by mouth daily in the afternoon. 10/16/19  Yes [provider]  sucralfate (CARAFATE) 1 g tablet Take 1 tablet (1 g total) by mouth 4 (four) times daily -  with meals and at bedtime. 02/10/20  Yes Horton, Mayer Masker, MD  dicyclomine (BENTYL) 20 MG tablet Take 1 tablet (20 mg total) by mouth 2 (two) times daily. Patient not taking: Reported on 02/18/2020 11/26/19   Dietrich Pates, PA-C  esomeprazole (NEXIUM) 40 MG capsule Take 1 capsule (40 mg total) by mouth 2 (two) times daily  before a meal. Patient not taking: Reported on 02/18/2020 07/18/19   Dartha Lodge, PA-C  gabapentin (NEURONTIN) 100 MG capsule Take 3 capsules (300 mg total) by mouth 3 (three) times daily for 7 days. Patient not taking: Reported on 02/18/2020 02/17/20 02/24/20  Miguel Aschoff., MD  ketorolac (TORADOL) 10 MG tablet Take 1 tablet (10 mg total) by mouth every 6 (six) hours as needed for up to 5 days. Patient not taking: Reported on 02/18/2020 02/17/20 02/22/20  Miguel Aschoff., MD  mirtazapine (REMERON) 15 MG tablet Take 1 tablet (15 mg total) by mouth at bedtime. Patient not taking: Reported on 02/18/2020 11/26/19   Dietrich Pates, PA-C  ondansetron (ZOFRAN ODT) 4 MG disintegrating tablet Take 1 tablet (4 mg total) by mouth every 8 (eight) hours as needed for nausea or vomiting. Patient not taking: Reported on 02/18/2020 11/26/19   Dietrich Pates, PA-C  oxyCODONE-acetaminophen (PERCOCET/ROXICET) 5-325 MG tablet Take 1 tablet by mouth every 4 (four) hours as needed. Patient not taking: Reported on 02/18/2020 11/27/19   Tegeler, Canary Brim, MD    Family History  Problem Relation Age of Onset  . Colon cancer Neg Hx   . Esophageal cancer Neg Hx      Social History   Tobacco Use  . Smoking status: Current Every  Day Smoker    Packs/day: 1.00    Types: Cigarettes  . Smokeless tobacco: Current User    Types: Snuff  . Tobacco comment: Does pouches as welll   Substance Use Topics  . Alcohol use: Yes    Comment: states "1 drink a day"  . Drug use: No    Allergies as of 02/17/2020 - Review Complete 02/17/2020  Allergen Reaction Noted  . Lisinopril Swelling 02/01/2018  . Shellfish allergy  10/12/2015    Review of Systems:    All systems reviewed and negative except where noted in HPI.   Physical Exam:  Vital signs in last 24 hours: Vitals:   02/18/20 1657 02/18/20 2348 02/19/20 0654 02/19/20 1213  BP: (!) 150/104 (!) 158/98 (!) 159/104 (!) 157/107  Pulse: (!) 101 (!) 104 (!) 110 99  Resp: 16 20 16 15     Temp: 98.5 F (36.9 C) 99.3 F (37.4 C) 99 F (37.2 C) 98.4 F (36.9 C)  TempSrc: Oral Oral Oral Oral  SpO2: 98% 99% 100% 100%  Weight:      Height:       Last BM Date: 02/16/20 General:   Pleasant, cooperative in NAD Head:  Normocephalic and atraumatic. Eyes:   No icterus.   Conjunctiva pink. PERRLA. Ears:  Normal auditory acuity. Neck:  Supple; no masses or thyroidomegaly Lungs: Respirations even and unlabored. Lungs clear to auscultation bilaterally.   No wheezes, crackles, or rhonchi.  Abdomen:  Soft, nondistended, nontender. Normal bowel sounds. No appreciable masses or hepatomegaly.  No rebound or guarding.  Neurologic:  Alert and oriented x3;  grossly normal neurologically. Skin:  Intact without significant lesions or rashes. Cervical Nodes:  No significant cervical adenopathy. Psych:  Alert and cooperative. Normal affect.  LAB RESULTS: Recent Labs    02/17/20 1805 02/18/20 0055  WBC 19.0* 16.7*  HGB 15.7 13.8  HCT 45.0 38.6*  PLT 173 150   BMET Recent Labs    02/17/20 1805 02/18/20 0055  NA 138 136  K 3.1* 3.4*  CL 98 100  CO2 25 27  GLUCOSE 136* 120*  BUN 13 10  CREATININE 0.77 0.70  CALCIUM 9.2 8.6*   LFT Recent Labs    02/18/20 0055  PROT 6.7  ALBUMIN 3.7  AST 22  ALT 26  ALKPHOS 58  BILITOT 0.9   PT/INR Recent Labs    02/18/20 0055  LABPROT 12.4  INR 1.0    STUDIES: CT Abdomen Pelvis W Contrast  Result Date: 02/17/2020 CLINICAL DATA:  Abdomen pain history of pancreatitis EXAM: CT ABDOMEN AND PELVIS WITH CONTRAST TECHNIQUE: Multidetector CT imaging of the abdomen and pelvis was performed using the standard protocol following bolus administration of intravenous contrast. CONTRAST:  04/18/2020 OMNIPAQUE IOHEXOL 300 MG/ML  SOLN COMPARISON:  Chest CT 02/01/2018 FINDINGS: Lower chest: Mild gynecomastia. No consolidation or pleural effusion. Mild cardiomegaly. Hepatobiliary: No focal liver abnormality is seen. No gallstones, gallbladder wall  thickening, or biliary dilatation. Pancreas: Moderate diffuse inflammatory changes and fluid consistent with acute pancreatitis. Relative area of hypodensity at the pancreatic head measuring 3.1 cm transverse by 2.4 cm craniocaudad suspected to represent developing organizing fluid collection. Spleen: Normal in size without focal abnormality. Adrenals/Urinary Tract: Adrenal glands show stable 14 mm left adrenal nodule and 17 mm right adrenal nodule. Subcentimeter hypodensities within the left kidney too small to further characterize. No hydronephrosis. The bladder is normal Stomach/Bowel: Small amount of hyperdense material in the stomach. Wall thickening and inflammatory change of the duodenum. No  dilated small bowel. No colon wall thickening. Negative appendix. Vascular/Lymphatic: Nonaneurysmal aorta. No suspicious adenopathy. Normal enhancement of the portal vein. Slightly narrowed appearance of the splenic and superior mesenteric veins near the confluence though without thrombus. Reproductive: Prostate is unremarkable. Other: No free air.  Free fluid in the pelvis and upper abdomen Musculoskeletal: No acute or significant osseous findings. IMPRESSION: 1. Findings consistent with acute pancreatitis. Hypoenhancing collection at the pancreatic head measuring up to 3.1 cm, suspect for developing organized fluid collection. Narrowed appearance of the splenic vein and superior mesenteric vein near the confluence but without definitive thrombus at this time. 2. Wall thickening and inflammatory change involving duodenum consistent with duodenitis and reactive inflammation from the pancreas 3. Small amount of free fluid in the abdomen and pelvis. 4. Bilateral adrenal gland nodules probably adenomas. Electronically Signed   By: Donavan Foil M.D.   On: 02/17/2020 20:09      Impression / Plan:   Mario Beck is a 39 y.o. y/o male with history of daily alcohol use, previous episode of pancreatitis in October 2020 at  [facility], managed conservatively admitted with abdominal pain and admitted for pancreatitis  Clinical symptoms and imaging consistent with pancreatitis due to alcohol use  Imaging shows developing fluid collection in the head of the pancreas  Since patient continues to clinically improve, with improving white count, abdominal pain, and since the fluid collection is not organized into walled off fluid collection at this point, this does not need drainage at this time.  Continue management for pancreatitis with IV fluids and pain medications as needed.  Would recommend 200 to 250 cc an hour of lactated Ringer's over the first 24 to 48 hours  Once patient able to tolerate oral diet, start with clear liquid diet and advance to low-fat diet slowly as tolerated  However, this will need follow-up with repeat imaging as an outpatient in the near future.  Patient should follow-up in GI clinic within 3 to 4 weeks of discharge.  however, if he does not clinically improve as an inpatient, repeat imaging would be needed prior to discharge  No evidence of pancreatic necrosis or infection at this time  Patient was encouraged to quit smoking and alcohol intake  CIWA protocol Folate, thiamine  Thank you for involving me in the care of this patient.      LOS: 1 day   Virgel Manifold, MD  02/19/2020, 2:01 PM

## 2020-02-19 NOTE — Progress Notes (Signed)
Patient approached the nurse's station stating he was ready to discharge. Patient did not have a discharge order. This Clinical research associate notified Dr. Allena Katz. Patient was informed of the risk leaving the hospital against medical advise and still chose to leave. AMA form signed and placed in shadow chart.

## 2020-02-19 NOTE — Progress Notes (Signed)
Patient has new IV to RFA , refused to have IVF continued at this time. States " I am going to walk around'.

## 2020-02-20 ENCOUNTER — Other Ambulatory Visit: Payer: Self-pay

## 2020-02-20 ENCOUNTER — Inpatient Hospital Stay (HOSPITAL_COMMUNITY)
Admission: EM | Admit: 2020-02-20 | Discharge: 2020-02-23 | Disposition: A | Discharge status: Home / Self Care | Payer: BLUE CROSS/BLUE SHIELD | Source: Home / Self Care | Attending: Internal Medicine | Admitting: Internal Medicine

## 2020-02-20 DIAGNOSIS — R52 Pain, unspecified: Secondary | ICD-10-CM

## 2020-02-20 DIAGNOSIS — K852 Alcohol induced acute pancreatitis without necrosis or infection: Secondary | ICD-10-CM

## 2020-02-20 DIAGNOSIS — R112 Nausea with vomiting, unspecified: Secondary | ICD-10-CM | POA: Diagnosis present

## 2020-02-20 LAB — COMPREHENSIVE METABOLIC PANEL
ALT: 20 U/L (ref 0–44)
AST: 17 U/L (ref 15–41)
Albumin: 4.2 g/dL (ref 3.5–5.0)
Alkaline Phosphatase: 79 U/L (ref 38–126)
Anion gap: 11 (ref 5–15)
BUN: 7 mg/dL (ref 6–20)
CO2: 25 mmol/L (ref 22–32)
Calcium: 10 mg/dL (ref 8.9–10.3)
Chloride: 102 mmol/L (ref 98–111)
Creatinine, Ser: 0.94 mg/dL (ref 0.61–1.24)
GFR calc Af Amer: >60 mL/min (ref >60)
GFR calc non Af Amer: >60 mL/min (ref >60)
Glucose, Bld: 163 mg/dL — ABNORMAL HIGH (ref 70–99)
Potassium: 5 mmol/L (ref 3.5–5.1)
Sodium: 138 mmol/L (ref 135–145)
Total Bilirubin: 0.7 mg/dL (ref 0.3–1.2)
Total Protein: 8.3 g/dL — ABNORMAL HIGH (ref 6.5–8.1)

## 2020-02-20 LAB — CBC
HCT: 41.6 % (ref 39.0–52.0)
Hemoglobin: 14.3 g/dL (ref 13.0–17.0)
MCH: 28.1 pg (ref 26.0–34.0)
MCHC: 34.4 g/dL (ref 30.0–36.0)
MCV: 81.7 fL (ref 80.0–100.0)
Platelets: 244 10*3/uL (ref 150–400)
RBC: 5.09 MIL/uL (ref 4.22–5.81)
RDW: 13.5 % (ref 11.5–15.5)
WBC: 7 10*3/uL (ref 4.0–10.5)
nRBC: 0 % (ref 0.0–0.2)

## 2020-02-20 LAB — URINALYSIS, COMPLETE (UACMP) WITH MICROSCOPIC
Bacteria, UA: NONE SEEN
Bilirubin Urine: NEGATIVE
Glucose, UA: NEGATIVE mg/dL
Hgb urine dipstick: NEGATIVE
Ketones, ur: NEGATIVE mg/dL
Leukocytes,Ua: NEGATIVE
Nitrite: NEGATIVE
Protein, ur: 100 mg/dL — AB
Specific Gravity, Urine: 1.009 (ref 1.005–1.030)
pH: 5 (ref 5.0–8.0)

## 2020-02-20 LAB — LIPASE, BLOOD: Lipase: 35 U/L (ref 11–51)

## 2020-02-20 MED ORDER — SODIUM CHLORIDE 0.9% FLUSH: 3.0000 mL | Freq: Once | INTRAVENOUS | Status: DC

## 2020-02-20 MED ORDER — HYDROMORPHONE HCL 1 MG/ML IJ SOLN
1.0000 mg | INTRAMUSCULAR | Status: AC | PRN
  Administered 2020-02-21 (×2): 1 mg via INTRAVENOUS
  Filled 2020-02-20 (×2): qty 1

## 2020-02-20 MED ORDER — ONDANSETRON HCL 4 MG/2ML IJ SOLN
4.0000 mg | Freq: Once | INTRAMUSCULAR | Status: AC
  Administered 2020-02-21: 4 mg via INTRAVENOUS
  Filled 2020-02-20: qty 2

## 2020-02-20 MED ORDER — SODIUM CHLORIDE 0.9 % IV BOLUS
1000.0000 mL | Freq: Once | INTRAVENOUS | Status: AC
  Administered 2020-02-20: 1000 mL via INTRAVENOUS

## 2020-02-20 MED ORDER — HYDROMORPHONE HCL 1 MG/ML IJ SOLN: 1.0000 mg | Freq: Once | INTRAMUSCULAR | Status: DC

## 2020-02-20 NOTE — ED Provider Notes (Signed)
[facility] Emergency Department Provider Note  ____________________________________________   First MD Initiated Contact with Patient 02/20/20 2254     (approximate)  I have reviewed the triage vital signs and the nursing notes.   HISTORY  Chief Complaint Abdominal Pain    HPI Mario Beck is a 39 y.o. male with alcohol abuse, hypertension and asthma who comes in for abdominal pain.  Patient had a CT scan on 5/3 that shows acute pancreatitis.  Patient states that he is continued to have pain.  States that he is coming in for admission.  He states that he was not taking care of himself previously but he is now wanting help.  Patient states that his pain is in the middle of his abdomen on the left side, constant, nothing makes it better, nothing makes it worse.  Patient stated that he did drink a few shots last around 2 PM to try to help the pain.  He reports a history of some withdrawal but no seizures.  No chest pain, shortness of breath.  States the pain is similar to when he was seen a few days ago.  He has had associated nausea and vomiting with it as well.          Past Medical History:  Diagnosis Date  . Alcohol abuse   . Asthma   . Depression   . Hypertension     Patient Active Problem List   Diagnosis Date Noted  . Tobacco abuse counseling   . Acute alcoholic pancreatitis 84/16/6063  . HTN (hypertension) 02/18/2020  . Alcohol use disorder, moderate, dependence ([place]) 02/18/2020  . Acute pancreatitis 02/18/2020    Past Surgical History:  Procedure Laterality Date  . FRACTURE SURGERY     Left leg as a kid    Prior to Admission medications   Medication Sig Start Date End Date Taking? Authorizing Provider  amLODipine (NORVASC) 10 MG tablet Take 1 tablet (10 mg total) by mouth daily. 11/26/19   Khatri, Hina, PA-C  dicyclomine (BENTYL) 20 MG tablet Take 1 tablet (20 mg total) by mouth 2 (two) times daily. Patient not taking: Reported on  02/18/2020 11/26/19   Delia Heady, PA-C  esomeprazole (NEXIUM) 40 MG capsule Take 1 capsule (40 mg total) by mouth 2 (two) times daily before a meal. Patient not taking: Reported on 02/18/2020 07/18/19   Jacqlyn Larsen, PA-C  gabapentin (NEURONTIN) 100 MG capsule Take 3 capsules (300 mg total) by mouth 3 (three) times daily for 7 days. Patient not taking: Reported on 02/18/2020 02/17/20 02/24/20  Lilia Pro., MD  ketorolac (TORADOL) 10 MG tablet Take 1 tablet (10 mg total) by mouth every 6 (six) hours as needed for up to 5 days. Patient not taking: Reported on 02/18/2020 02/17/20 02/22/20  Lilia Pro., MD  mirtazapine (REMERON) 15 MG tablet Take 1 tablet (15 mg total) by mouth at bedtime. Patient not taking: Reported on 02/18/2020 11/26/19   Delia Heady, PA-C  ondansetron (ZOFRAN ODT) 4 MG disintegrating tablet Take 1 tablet (4 mg total) by mouth every 8 (eight) hours as needed for nausea or vomiting. Patient not taking: Reported on 02/18/2020 11/26/19   Delia Heady, PA-C  ondansetron (ZOFRAN) 4 MG tablet Take 1 tablet (4 mg total) by mouth every 8 (eight) hours as needed for up to 7 days for nausea or vomiting. 02/17/20 02/24/20  Lilia Pro., MD  oxyCODONE-acetaminophen (PERCOCET/ROXICET) 5-325 MG tablet Take 1 tablet by mouth every 4 (four) hours  as needed. Patient not taking: Reported on 02/18/2020 11/27/19   Tegeler, Canary Brim, MD  sertraline (ZOLOFT) 100 MG tablet Take 1 tablet by mouth daily in the afternoon. 10/16/19   [provider]  sucralfate (CARAFATE) 1 g tablet Take 1 tablet (1 g total) by mouth 4 (four) times daily -  with meals and at bedtime. 02/10/20   Horton, Mayer Masker, MD    Allergies Lisinopril and Shellfish allergy  Family History  Problem Relation Age of Onset  . Colon cancer Neg Hx   . Esophageal cancer Neg Hx     Social History Social History   Tobacco Use  . Smoking status: Current Every Day Smoker    Packs/day: 1.00    Types: Cigarettes  . Smokeless tobacco:  Current User    Types: Snuff  . Tobacco comment: Does pouches as welll   Substance Use Topics  . Alcohol use: Yes    Comment: half a pint  . Drug use: No      Review of Systems Constitutional: No fever/chills Eyes: No visual changes. ENT: No sore throat. Cardiovascular: Denies chest pain. Respiratory: Denies shortness of breath. Gastrointestinal: Positive abdominal pain, nausea, vomiting no diarrhea.  No constipation. Genitourinary: Negative for dysuria. Musculoskeletal: Negative for back pain. Skin: Negative for rash. Neurological: Negative for headaches, focal weakness or numbness. All other ROS negative ____________________________________________   PHYSICAL EXAM:  VITAL SIGNS: ED Triage Vitals [02/20/20 1820]  Enc Vitals Group     BP (!) 136/95     Pulse Rate (!) 117     Resp 18     Temp 98.4 F (36.9 C)     Temp src      SpO2 100 %     Weight 184 lb 15.5 oz (83.9 kg)     Height 6\' 3"  (1.905 m)     Head Circumference      Peak Flow      Pain Score 8     Pain Loc      Pain Edu?      Excl. in GC?     Constitutional: Alert and oriented. Well appearing and in no acute distress. Eyes: Conjunctivae are normal. EOMI. Head: Atraumatic. Nose: No congestion/rhinnorhea. Mouth/Throat: Mucous membranes are moist.   Neck: No stridor. Trachea Midline. FROM Cardiovascular: Normal rate, regular rhythm. Grossly normal heart sounds.  Good peripheral circulation. Respiratory: Normal respiratory effort.  No retractions. Lungs CTAB. Gastrointestinal: Tender in the middle of the abdomen and a little bit on the left side no distention. No abdominal bruits.  Musculoskeletal: No lower extremity tenderness nor edema.  No joint effusions. Neurologic:  Normal speech and language. No gross focal neurologic deficits are appreciated.  Skin:  Skin is warm, dry and intact. No rash noted. Psychiatric: Mood and affect are normal. Speech and behavior are normal. GU: Deferred    ____________________________________________   LABS (all labs ordered are listed, but only abnormal results are displayed)  Labs Reviewed  COMPREHENSIVE METABOLIC PANEL - Abnormal; Notable for the following components:      Result Value   Glucose, Bld 163 (*)    Total Protein 8.3 (*)    All other components within normal limits  URINALYSIS, COMPLETE (UACMP) WITH MICROSCOPIC - Abnormal; Notable for the following components:   Color, Urine YELLOW (*)    APPearance CLOUDY (*)    Protein, ur 100 (*)    All other components within normal limits  LIPASE, BLOOD  CBC   ____________________________________________  PROCEDURES  Procedure(s) performed (including Critical Care):  Procedures   ____________________________________________   INITIAL IMPRESSION / ASSESSMENT AND PLAN / ED COURSE  Carmino Ocain was evaluated in Emergency Department on 02/20/2020 for the symptoms described in the history of present illness. He was evaluated in the context of the global COVID-19 pandemic, which necessitated consideration that the patient might be at risk for infection with the SARS-CoV-2 virus that causes COVID-19. Institutional protocols and algorithms that pertain to the evaluation of patients at risk for COVID-19 are in a state of rapid change based on information released by regulatory bodies including the CDC and federal and state organizations. These policies and algorithms were followed during the patient's care in the ED.    Patient is a 39 year old with multiple presentations for alcohol induced pancreatitis, gastritis.  Patient's pain is similar to prior study not think we need a repeat CT scan given low suspicion for perforation, obstruction.  Will get labs to evaluate for his pancreatitis, electrolyte abnormalities, AKI  CT imaging on 5/3  1. Findings consistent with acute pancreatitis. Hypoenhancing collection at the pancreatic head measuring up to 3.1 cm, suspect for developing  organized fluid collection. Narrowed appearance of the splenic vein and superior mesenteric vein near the confluence but without definitive thrombus at this time. 2. Wall thickening and inflammatory change involving duodenum consistent with duodenitis and reactive inflammation from the pancreas 3. Small amount of free fluid in the abdomen and pelvis. 4. Bilateral adrenal gland nodules probably adenomas.  Lipase 35 White count has come down to 7 Patient urine does have protein consistent with some dehydration and patient is tachycardic   We will give patient some IV hydration, IV Dilaudid and IV Zofran and reassess symptoms.  Patient is requesting admission but I stated that we need to first trial some medicine to see how he is doing  Reevaluated patient he states he is continued to have discomfort he does not feel comfortable going home and is requesting admission for his uncontrolled pain and vomiting secondary to her recent hepatitis     ____________________________________________   FINAL CLINICAL IMPRESSION(S) / ED DIAGNOSES   Final diagnoses:  Intractable pain  Intractable vomiting with nausea, unspecified vomiting type  Alcohol-induced acute pancreatitis, unspecified complication status      MEDICATIONS GIVEN DURING THIS VISIT:  Medications  sodium chloride flush (NS) 0.9 % injection 3 mL (has no administration in time range)  sodium chloride 0.9 % bolus 1,000 mL (1,000 mLs Intravenous New Bag/Given 02/20/20 2358)  ondansetron (ZOFRAN) injection 4 mg (4 mg Intravenous Given 02/21/20 0000)  HYDROmorphone (DILAUDID) injection 1 mg (1 mg Intravenous Given 02/21/20 0102)     ED Discharge Orders    None       Note:  This document was prepared using Dragon voice recognition software and may include unintentional dictation errors.   Concha Se, MD 02/21/20 [phone]

## 2020-02-20 NOTE — ED Triage Notes (Addendum)
Pt comes via POV from home with c/o abdominal pain. Pt states he was jsut here and was suppose to be admitted. CT abdomen from 02/17/20 shows acute pancreatitis.  Pt states it is his pancreas. Pt states he thought he was better but it has not gotten better.  Pt states pain right sided pain.

## 2020-02-20 NOTE — ED Notes (Signed)
RN attempted IV access without success. Sue Lush, RN at bedside to attempt IV. Warm blankets provided.

## 2020-02-21 ENCOUNTER — Other Ambulatory Visit: Payer: Self-pay

## 2020-02-21 ENCOUNTER — Encounter: Payer: Self-pay | Admitting: Family Medicine

## 2020-02-21 DIAGNOSIS — K2980 Duodenitis without bleeding: Secondary | ICD-10-CM

## 2020-02-21 DIAGNOSIS — I10 Essential (primary) hypertension: Secondary | ICD-10-CM

## 2020-02-21 DIAGNOSIS — K852 Alcohol induced acute pancreatitis without necrosis or infection: Principal | ICD-10-CM

## 2020-02-21 DIAGNOSIS — R112 Nausea with vomiting, unspecified: Secondary | ICD-10-CM | POA: Diagnosis present

## 2020-02-21 LAB — CBC
HCT: 33.7 % — ABNORMAL LOW (ref 39.0–52.0)
Hemoglobin: 12 g/dL — ABNORMAL LOW (ref 13.0–17.0)
MCH: 28.2 pg (ref 26.0–34.0)
MCHC: 35.6 g/dL (ref 30.0–36.0)
MCV: 79.3 fL — ABNORMAL LOW (ref 80.0–100.0)
Platelets: 230 10*3/uL (ref 150–400)
RBC: 4.25 MIL/uL (ref 4.22–5.81)
RDW: 13.7 % (ref 11.5–15.5)
WBC: 5.9 10*3/uL (ref 4.0–10.5)
nRBC: 0 % (ref 0.0–0.2)

## 2020-02-21 LAB — BASIC METABOLIC PANEL
Anion gap: 8 (ref 5–15)
BUN: 9 mg/dL (ref 6–20)
CO2: 26 mmol/L (ref 22–32)
Calcium: 8.7 mg/dL — ABNORMAL LOW (ref 8.9–10.3)
Chloride: 105 mmol/L (ref 98–111)
Creatinine, Ser: 0.86 mg/dL (ref 0.61–1.24)
GFR calc Af Amer: >60 mL/min (ref >60)
GFR calc non Af Amer: >60 mL/min (ref >60)
Glucose, Bld: 104 mg/dL — ABNORMAL HIGH (ref 70–99)
Potassium: 3.6 mmol/L (ref 3.5–5.1)
Sodium: 139 mmol/L (ref 135–145)

## 2020-02-21 LAB — LIPASE, BLOOD: Lipase: 33 U/L (ref 11–51)

## 2020-02-21 MED ORDER — PANTOPRAZOLE SODIUM 40 MG IV SOLR
40.0000 mg | Freq: Two times a day (BID) | INTRAVENOUS | Status: DC
  Administered 2020-02-21 – 2020-02-23 (×5): 40 mg via INTRAVENOUS
  Filled 2020-02-21 (×5): qty 40

## 2020-02-21 MED ORDER — LORAZEPAM 2 MG/ML IJ SOLN: 1.0000 mg | INTRAMUSCULAR | Status: DC | PRN

## 2020-02-21 MED ORDER — LORAZEPAM 1 MG PO TABS
1.0000 mg | ORAL_TABLET | ORAL | Status: DC | PRN
  Administered 2020-02-21: 09:00:00 1 mg via ORAL
  Filled 2020-02-21: qty 1

## 2020-02-21 MED ORDER — AMLODIPINE BESYLATE 10 MG PO TABS
10.0000 mg | ORAL_TABLET | Freq: Every day | ORAL | Status: DC
  Administered 2020-02-21 – 2020-02-23 (×3): 10 mg via ORAL
  Filled 2020-02-21 (×3): qty 1

## 2020-02-21 MED ORDER — SERTRALINE HCL 50 MG PO TABS
100.0000 mg | ORAL_TABLET | Freq: Every day | ORAL | Status: DC
  Administered 2020-02-21 – 2020-02-22 (×2): 100 mg via ORAL
  Filled 2020-02-21 (×2): qty 2

## 2020-02-21 MED ORDER — THIAMINE HCL 100 MG PO TABS: 100.0000 mg | ORAL_TABLET | Freq: Every day | ORAL | Status: DC

## 2020-02-21 MED ORDER — ACETAMINOPHEN 650 MG RE SUPP: 650.0000 mg | Freq: Four times a day (QID) | RECTAL | Status: DC | PRN

## 2020-02-21 MED ORDER — THIAMINE HCL 100 MG PO TABS
100.0000 mg | ORAL_TABLET | Freq: Every day | ORAL | Status: DC
  Administered 2020-02-21 – 2020-02-23 (×3): 100 mg via ORAL
  Filled 2020-02-21 (×3): qty 1

## 2020-02-21 MED ORDER — FOLIC ACID 1 MG PO TABS
1.0000 mg | ORAL_TABLET | Freq: Every day | ORAL | Status: DC
  Administered 2020-02-21 – 2020-02-23 (×3): 1 mg via ORAL
  Filled 2020-02-21 (×3): qty 1

## 2020-02-21 MED ORDER — METOCLOPRAMIDE HCL 5 MG/ML IJ SOLN: 10.0000 mg | Freq: Four times a day (QID) | INTRAMUSCULAR | Status: DC | PRN

## 2020-02-21 MED ORDER — THIAMINE HCL 100 MG/ML IJ SOLN: 100.0000 mg | Freq: Every day | INTRAMUSCULAR | Status: DC

## 2020-02-21 MED ORDER — ONDANSETRON HCL 4 MG PO TABS: 4.0000 mg | ORAL_TABLET | Freq: Four times a day (QID) | ORAL | Status: DC | PRN

## 2020-02-21 MED ORDER — MAGNESIUM HYDROXIDE 400 MG/5ML PO SUSP
30.0000 mL | Freq: Every day | ORAL | Status: DC | PRN
  Administered 2020-02-21: 21:00:00 30 mL via ORAL
  Filled 2020-02-21: qty 30

## 2020-02-21 MED ORDER — SODIUM CHLORIDE 0.9 % IV SOLN: INTRAVENOUS | Status: DC

## 2020-02-21 MED ORDER — CHLORDIAZEPOXIDE HCL 25 MG PO CAPS
25.0000 mg | ORAL_CAPSULE | Freq: Three times a day (TID) | ORAL | Status: DC
  Administered 2020-02-21 – 2020-02-23 (×6): 25 mg via ORAL
  Filled 2020-02-21 (×6): qty 1

## 2020-02-21 MED ORDER — ENOXAPARIN SODIUM 40 MG/0.4ML ~~LOC~~ SOLN
40.0000 mg | SUBCUTANEOUS | Status: DC
  Administered 2020-02-21 – 2020-02-23 (×3): 40 mg via SUBCUTANEOUS
  Filled 2020-02-21 (×3): qty 0.4

## 2020-02-21 MED ORDER — ACETAMINOPHEN 325 MG PO TABS: 650.0000 mg | ORAL_TABLET | Freq: Four times a day (QID) | ORAL | Status: DC | PRN

## 2020-02-21 MED ORDER — ONDANSETRON HCL 4 MG/2ML IJ SOLN: 4.0000 mg | Freq: Four times a day (QID) | INTRAMUSCULAR | Status: DC | PRN

## 2020-02-21 MED ORDER — LABETALOL HCL 5 MG/ML IV SOLN
20.0000 mg | INTRAVENOUS | Status: DC | PRN
  Administered 2020-02-21 – 2020-02-22 (×2): 20 mg via INTRAVENOUS
  Filled 2020-02-21 (×3): qty 4

## 2020-02-21 MED ORDER — TRAZODONE HCL 50 MG PO TABS
25.0000 mg | ORAL_TABLET | Freq: Every evening | ORAL | Status: DC | PRN
  Administered 2020-02-21: 22:00:00 25 mg via ORAL
  Filled 2020-02-21: qty 1

## 2020-02-21 MED ORDER — MORPHINE SULFATE (PF) 2 MG/ML IV SOLN
2.0000 mg | INTRAVENOUS | Status: DC | PRN
  Administered 2020-02-21 – 2020-02-22 (×5): 2 mg via INTRAVENOUS
  Filled 2020-02-21 (×5): qty 1

## 2020-02-21 MED ORDER — TAB-A-VITE/IRON PO TABS
1.0000 | ORAL_TABLET | Freq: Every day | ORAL | Status: DC
  Administered 2020-02-21 – 2020-02-23 (×2): 1 via ORAL
  Filled 2020-02-21 (×3): qty 1

## 2020-02-21 MED ORDER — METOCLOPRAMIDE HCL 5 MG/ML IJ SOLN
10.0000 mg | Freq: Four times a day (QID) | INTRAMUSCULAR | Status: DC
  Administered 2020-02-21 (×2): 10 mg via INTRAVENOUS
  Filled 2020-02-21 (×3): qty 2

## 2020-02-21 MED ORDER — MIRTAZAPINE 15 MG PO TABS
15.0000 mg | ORAL_TABLET | Freq: Every day | ORAL | Status: DC
  Administered 2020-02-21 – 2020-02-22 (×2): 15 mg via ORAL
  Filled 2020-02-21 (×2): qty 1

## 2020-02-21 NOTE — H&P (Addendum)
Mario Beck at [facility]   PATIENT NAME: Mario Beck    MR#:  [id number]  DATE OF BIRTH:  [birth year]  DATE OF ADMISSION:  02/20/2020  PRIMARY CARE PHYSICIAN: Patient, No Pcp Per   REQUESTING/REFERRING PHYSICIAN: Artis Delay, MD  CHIEF COMPLAINT:   Chief Complaint  Patient presents with  . Abdominal Pain    HISTORY OF PRESENT ILLNESS:  Mario Beck  is a 39 y.o. African-American male with a known history of asthma, alcohol abuse, hypertension and depression, who presented to the emergency room with acute onset intractable nausea and vomiting.  He was admitted here on 5/3 and left AMA.  He stated that he continued to have nausea and vomiting with associated upper abdominal pain which have not resolved.  His abdominal pelvic CT scan then showed findings consistent with acute pancreatitis with hypoenhancing collection of the pancreatic head measuring up to 3.1 cm as well as duodenitis and bilateral adrenal gland nodules probably adenomas.  The patient's last alcoholic drink was at 1 PM.  Upon presentation to the emergency room, heart rate was 117 and vital signs otherwise were within normal.  Blood pressure though later on was 160/110.  Labs revealed borderline potassium of 5 and CBC was unremarkable with WBC of 7 down from 16.7 on 02/18/2020.  Recent COVID-19 PCR and influenza antigens on 5/4 were negative.  UA showed 6-10 WBCs and positive hyaline casts.  The patient was given 1 mg of IV Dilaudid, 4 mg IV Zofran and 1 L of IV normal saline bolus.  He will be admitted to an observation medically monitored bed.  PAST MEDICAL HISTORY:   Past Medical History:  Diagnosis Date  . Alcohol abuse   . Asthma   . Depression   . Hypertension     PAST SURGICAL HISTORY:   Past Surgical History:  Procedure Laterality Date  . FRACTURE SURGERY     Left leg as a kid    SOCIAL HISTORY:   Social History   Tobacco Use  . Smoking status: Current Every Day Smoker    Packs/day: 1.00     Types: Cigarettes  . Smokeless tobacco: Current User    Types: Snuff  . Tobacco comment: Does pouches as welll   Substance Use Topics  . Alcohol use: Yes    Comment: half a pint    FAMILY HISTORY:   Family History  Problem Relation Age of Onset  . Colon cancer Neg Hx   . Esophageal cancer Neg Hx     DRUG ALLERGIES:   Allergies  Allergen Reactions  . Lisinopril Swelling    Angioedema   . Shellfish Allergy     REVIEW OF SYSTEMS:   ROS As per history of present illness. All pertinent systems were reviewed above. Constitutional,  HEENT, cardiovascular, respiratory, GI, GU, musculoskeletal, neuro, psychiatric, endocrine,  integumentary and hematologic systems were reviewed and are otherwise  negative/unremarkable except for positive findings mentioned above in the HPI.   MEDICATIONS AT HOME:   Prior to Admission medications   Medication Sig Start Date End Date Taking? Authorizing Provider  amLODipine (NORVASC) 10 MG tablet Take 1 tablet (10 mg total) by mouth daily. 11/26/19  Yes Khatri, Hina, PA-C  ondansetron (ZOFRAN) 4 MG tablet Take 1 tablet (4 mg total) by mouth every 8 (eight) hours as needed for up to 7 days for nausea or vomiting. 02/17/20 02/24/20 Yes Miguel Aschoff., MD  sertraline (ZOLOFT) 100 MG tablet Take 1 tablet by mouth daily  in the afternoon. 10/16/19  Yes [provider]  sucralfate (CARAFATE) 1 g tablet Take 1 tablet (1 g total) by mouth 4 (four) times daily -  with meals and at bedtime. 02/10/20  Yes Horton, Mayer Masker, MD  dicyclomine (BENTYL) 20 MG tablet Take 1 tablet (20 mg total) by mouth 2 (two) times daily. Patient not taking: Reported on 02/18/2020 11/26/19   Dietrich Pates, PA-C  esomeprazole (NEXIUM) 40 MG capsule Take 1 capsule (40 mg total) by mouth 2 (two) times daily before a meal. Patient not taking: Reported on 02/18/2020 07/18/19   Dartha Lodge, PA-C  gabapentin (NEURONTIN) 100 MG capsule Take 3 capsules (300 mg total) by mouth 3 (three)  times daily for 7 days. Patient not taking: Reported on 02/18/2020 02/17/20 02/24/20  Miguel Aschoff., MD  ketorolac (TORADOL) 10 MG tablet Take 1 tablet (10 mg total) by mouth every 6 (six) hours as needed for up to 5 days. Patient not taking: Reported on 02/18/2020 02/17/20 02/22/20  Miguel Aschoff., MD  mirtazapine (REMERON) 15 MG tablet Take 1 tablet (15 mg total) by mouth at bedtime. Patient not taking: Reported on 02/18/2020 11/26/19   Dietrich Pates, PA-C  ondansetron (ZOFRAN ODT) 4 MG disintegrating tablet Take 1 tablet (4 mg total) by mouth every 8 (eight) hours as needed for nausea or vomiting. Patient not taking: Reported on 02/18/2020 11/26/19   Dietrich Pates, PA-C  oxyCODONE-acetaminophen (PERCOCET/ROXICET) 5-325 MG tablet Take 1 tablet by mouth every 4 (four) hours as needed. Patient not taking: Reported on 02/18/2020 11/27/19   Tegeler, Canary Brim, MD      VITAL SIGNS:  Blood pressure (!) 142/107, pulse 86, temperature 98.4 F (36.9 C), resp. rate 16, height 6\' 3"  (1.905 m), weight 83.9 kg, SpO2 99 %.  PHYSICAL EXAMINATION:  Physical Exam  GENERAL:  39 y.o.-year-old African-American male patient lying in the bed with no acute distress.  EYES: Pupils equal, round, reactive to light and accommodation. No scleral icterus. Extraocular muscles intact.  HEENT: Head atraumatic, normocephalic. Oropharynx and nasopharynx clear.  NECK:  Supple, no jugular venous distention. No thyroid enlargement, no tenderness.  LUNGS: Normal breath sounds bilaterally, no wheezing, rales,rhonchi or crepitation. No use of accessory muscles of respiration.  CARDIOVASCULAR: Regular rate and rhythm, S1, S2 normal. No murmurs, rubs, or gallops.  ABDOMEN: Soft, nondistended with minimal epigastric tenderness without rebound tenderness guarding or rigidity.  Bowel sounds present. No organomegaly or mass.  EXTREMITIES: No pedal edema, cyanosis, or clubbing.  NEUROLOGIC: Cranial nerves II through XII are intact. Muscle strength  5/5 in all extremities. Sensation intact. Gait not checked.  PSYCHIATRIC: The patient is alert and oriented x 3.  Normal affect and good eye contact. SKIN: No obvious rash, lesion, or ulcer.   LABORATORY PANEL:   CBC Recent Labs  Lab 02/20/20 1822  WBC 7.0  HGB 14.3  HCT 41.6  PLT 244   ------------------------------------------------------------------------------------------------------------------  Chemistries  Recent Labs  Lab 02/18/20 0055 02/18/20 0055 02/20/20 1822  NA 136   < > 138  K 3.4*   < > 5.0  CL 100   < > 102  CO2 27   < > 25  GLUCOSE 120*   < > 163*  BUN 10   < > 7  CREATININE 0.70   < > 0.94  CALCIUM 8.6*   < > 10.0  MG 2.0  --   --   AST 22   < > 17  ALT 26   < >  20  ALKPHOS 58   < > 79  BILITOT 0.9   < > 0.7   < > = values in this interval not displayed.   ------------------------------------------------------------------------------------------------------------------  Cardiac Enzymes No results for input(s): TROPONINI in the last 168 hours. ------------------------------------------------------------------------------------------------------------------  RADIOLOGY:  No results found.    IMPRESSION AND PLAN:   1.  Intractable nausea and vomiting with associated abdominal pain.  This is likely secondary to acute duodenitis and resolving acute alcoholic pancreatitis. -The patient will be admitted to an observation medically monitored bed. -We will hydrate the patient with IV normal saline. -Scheduled and as needed antiemetics will be provided. -Pain management will be provided. -We will keep him n.p.o. for now. -The patient will be placed on as needed IV Ativan as well as p.o. thiamine, folic acid and multivitamins.  2.  Acute duodenitis. -We will place the patient on IV PPI therapy.  3.  Essential hypertension. -We will continue patient on Norvasc and clonidine as well as HCTZ.  4.  Peripheral neuropathy. -We will continue  Neurontin.  5.  Depression. -We will continue Zoloft.  6.  Tobacco abuse. -I counseled the patient for smoking cessation and he will receive further counseling here.  7.  DVT prophylaxis. -Subcutaneous Lovenox.   All the records are reviewed and case discussed with ED provider. The plan of care was discussed in details with the patient (and family). I answered all questions. The patient agreed to proceed with the above mentioned plan. Further management will depend upon hospital course.   CODE STATUS: Full code  Status is: Observation  The patient remains OBS appropriate and will d/c before 2 midnights.  Dispo: The patient is from: Home              Anticipated d/c is to: Home              Anticipated d/c date is: 1 day              Patient currently is not medically stable to d/c.   TOTAL TIME TAKING CARE OF THIS PATIENT: 55 minutes.    Christel Mormon M.D on 02/21/2020 at 1:27 AM  [facility]   From 7 PM-7 AM, contact night-coverage [email or web address]  CC: Primary care physician; Patient, No Pcp Per   Note: This dictation was prepared with Dragon dictation along with smaller phrase technology. Any transcriptional errors that result from this process are unintentional.

## 2020-02-21 NOTE — Discharge Summary (Signed)
Patient was admitted on May 4th with acute alcohol induced pancreatitis. He has history of heavy alcohol abuse. Siva protocol was continued. IV fluids and pain medications were given. Patient decided to leave AMA on fifth May.   Final discharge diagnosis and discharge medications not dictated due to patient leaving AMA

## 2020-02-21 NOTE — Progress Notes (Signed)
  PROGRESS NOTE    Mario Beck  TXL:[id number] DOB: 1981-03-07 DOA: 02/20/2020  PCP: Patient, No Pcp Per    LOS - 0    Patient admitted overnight with persistent intractable abdominal pain with nausea and vomiting.  He was here on 5/3, left AMA.  He is being treated for pancreatitis and duodenitis, in addition to CIWA protocol for alcohol withdrawal.  Interval subjective: Patient sleeping but answers questions.  Says "everything is fine right now".  Denies tremors or withdrawal symptoms at this time.  Exam: Sleeping, no distress, lungs clear, heart regular, no edema  I have reviewed the full H&P by Dr. Arville Care in detail, and I agree with the assessment and plan as outlined therein. In addition: --started Librium 25 mg PO TID to hopefully get ahead of withdrawal and reduce need for Ativan   No Charge    Pennie Banter, DO [facility]   If 7PM-7AM, please contact night-coverage [email or web address] 02/21/2020, 1:27 PM

## 2020-02-21 NOTE — TOC Initial Note (Signed)
Transition of Care [facility]) - Initial/Assessment Note    Patient Details  Name: Mario Beck MRN: [id number] Date of Birth: Aug 25, 1981  [facility] CM/SW Contact:    Chapman Fitch, RN Phone Number: 02/21/2020, 1:55 PM  Clinical Narrative:                   Expected Discharge Plan: Home/Self Care Barriers to Discharge: Continued Medical Work up   Patient Goals and CMS Choice  Patient admitted for pancreatitis  Left AMA from this facility on 02/19/20  Patient states that his last drink was 02/20/20.  Patient confirms he was provided substance abuse resources on previous admission.  Patient accepts the resources again from this [facility]         Expected Discharge Plan and Services Expected Discharge Plan: Home/Self Care   Discharge Planning Services: CM Consult   Living arrangements for the past 2 months: Apartment                                      Prior Living Arrangements/Services Living arrangements for the past 2 months: Apartment Lives with:: Significant Other Patient language and need for interpreter reviewed:: Yes Do you feel safe going back to the place where you live?: Yes      Need for Family Participation in Patient Care: No (Comment) Care giver support system in place?: Yes (comment)   Criminal Activity/Legal Involvement Pertinent to Current Situation/Hospitalization: No - Comment as needed  Activities of Daily Living Home Assistive Devices/Equipment: None ADL Screening (condition at time of admission) Patient's cognitive ability adequate to safely complete daily activities?: Yes Is the patient deaf or have difficulty hearing?: No Does the patient have difficulty seeing, even when wearing glasses/contacts?: No Does the patient have difficulty concentrating, remembering, or making decisions?: No Patient able to express need for assistance with ADLs?: Yes Does the patient have difficulty dressing or bathing?: No Independently performs  ADLs?: Yes (appropriate for developmental age) Does the patient have difficulty walking or climbing stairs?: No Weakness of Legs: None Weakness of Arms/Hands: None  Permission Sought/Granted                  Emotional Assessment Appearance:: Appears stated age     Orientation: : Oriented to Self, Oriented to Place, Oriented to  Time, Oriented to Situation Alcohol / Substance Use: Alcohol Use Psych Involvement: No (comment)  Admission diagnosis:  Intractable pain [R52] Nausea and vomiting [R11.2] Intractable vomiting with nausea, unspecified vomiting type [R11.2] Alcohol-induced acute pancreatitis, unspecified complication status [K85.20] Patient Active Problem List   Diagnosis Date Noted  . Nausea and vomiting 02/21/2020  . Tobacco abuse counseling   . Acute alcoholic pancreatitis 02/18/2020  . HTN (hypertension) 02/18/2020  . Alcohol use disorder, moderate, dependence ([facility]) 02/18/2020  . Acute pancreatitis 02/18/2020   PCP:  Patient, No Pcp Per Pharmacy:   [id number] - [place], [place] - [address] [place] [address] Phone: [phone] Fax: [phone]  [facility] - [place], [place] - [address] [place] [address] Phone: [phone] Fax: [phone]  [id number] - [place], [place] - [address] AT [address] [facility] [address] [place] [address] Phone: [phone] Fax: [phone]     Social  Determinants of Health (SDOH) Interventions    Readmission Risk Interventions No flowsheet data found.

## 2020-02-21 NOTE — Plan of Care (Signed)
  Problem: Education: Goal: Knowledge of General Education information will improve Description: Including pain rating scale, medication(s)/side effects and non-pharmacologic comfort measures Outcome: Progressing   Problem: Clinical Measurements: Goal: Ability to maintain clinical measurements within normal limits will improve Outcome: Progressing Goal: Will remain free from infection Outcome: Progressing   Problem: Nutrition: Goal: Adequate nutrition will be maintained Outcome: Progressing   Problem: Coping: Goal: Level of anxiety will decrease Outcome: Progressing   Problem: Pain Managment: Goal: General experience of comfort will improve Outcome: Progressing   

## 2020-02-22 LAB — BASIC METABOLIC PANEL
Anion gap: 10 (ref 5–15)
BUN: 6 mg/dL (ref 6–20)
CO2: 23 mmol/L (ref 22–32)
Calcium: 8.8 mg/dL — ABNORMAL LOW (ref 8.9–10.3)
Chloride: 108 mmol/L (ref 98–111)
Creatinine, Ser: 0.75 mg/dL (ref 0.61–1.24)
GFR calc Af Amer: >60 mL/min (ref >60)
GFR calc non Af Amer: >60 mL/min (ref >60)
Glucose, Bld: 94 mg/dL (ref 70–99)
Potassium: 3.7 mmol/L (ref 3.5–5.1)
Sodium: 141 mmol/L (ref 135–145)

## 2020-02-22 LAB — LIPASE, BLOOD: Lipase: 29 U/L (ref 11–51)

## 2020-02-22 MED ORDER — HYDROMORPHONE HCL 1 MG/ML IJ SOLN: 0.5000 mg | INTRAMUSCULAR | Status: DC | PRN

## 2020-02-22 MED ORDER — OXYCODONE HCL 5 MG PO TABS: 5.0000 mg | ORAL_TABLET | ORAL | Status: DC | PRN

## 2020-02-22 MED ORDER — GABAPENTIN 300 MG PO CAPS
300.0000 mg | ORAL_CAPSULE | Freq: Four times a day (QID) | ORAL | Status: DC
  Administered 2020-02-22 – 2020-02-23 (×3): 300 mg via ORAL
  Filled 2020-02-22 (×3): qty 1

## 2020-02-22 MED ORDER — TRAZODONE HCL 50 MG PO TABS: 50.0000 mg | ORAL_TABLET | Freq: Every evening | ORAL | Status: DC | PRN

## 2020-02-22 MED ORDER — OXYCODONE HCL 5 MG PO TABS
10.0000 mg | ORAL_TABLET | ORAL | Status: DC | PRN
  Administered 2020-02-22 (×3): 10 mg via ORAL
  Filled 2020-02-22 (×3): qty 2

## 2020-02-22 NOTE — Plan of Care (Signed)
Continuing with plan of care. 

## 2020-02-22 NOTE — Progress Notes (Signed)
PROGRESS NOTE    Mario Beck   MGQ:[id number]  DOB: 09/29/1981  PCP: Patient, No Pcp Per    DOA: 02/20/2020 LOS: 1   Brief Narrative   Mario Beck  is a 39 y.o. African-American male with a history of asthma, alcohol abuse, hypertension and depression, who presented to the ED on 02/21/20 with intractable nausea, vomiting and abdominal pain.  He had recently been admitted for same, but left AMA on 5/3.  Had persistent severe symptoms, prompting him to return.    In the ED, tachycardic 117 bpm, hypertensive 160/110 with otherwise normal vitals.  Labs notable for no leukocytosis (wbc 7k down from 16.7 ok 5/4).  UA showed 6-10 WBC's and hyaline casts.  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  Treated with IV Dilaudid, IV Zofran and 1 L normal saline bolus.  Admitted to hospitalist service for further management.    Assessment & Plan   Active Problems:   Nausea and vomiting   Intractable nausea and vomiting    -The patient will be admitted to an observation medically monitored bed. -We will hydrate the patient with IV normal saline. -Scheduled and as needed antiemetics will be provided. -Pain management will be provided. -We will keep him n.p.o. for now. -The patient will be placed on as needed IV Ativan as well as p.o. thiamine, folic acid and multivitamins.  Alcohol withdrawal, uncomplicated - present on admission.  Doing well on Librium so far.   --continue Librium & taper down --continue CIWA protol with PRN Ativan   Acute alcoholic pancreatitis and duodenitis - with abdominal pain, nausea and vomiting present on admission.  --clear liquid diet (did not tolerate advanced diet) --stop fluids and monitor PO hydration --PRN antiemetics --changed up pain meds:    oral oxycodone 5mg  for moderate, 10mg  for severe, dilaudid for  breakthrough --in pain controlled on PO meds and tolerating PO intake, possible d/c tomorrow  GERD - takes Nexium at home.  Held while on IV PPI.  Essential hypertension - continue home Norvasc, clonidine and HCTZ.  Peripheral neuropathy - unclear cause, ?alcohol abuse.  Continue gabapentin.  Depression/Anxiety - continue home Zoloft and Remeron.  Hold home PRN Vistaril for now while on benzo.  Insomnia - continue home trazodone at 50 mg given use of benzo's, home dose is 100 mg.  Tobacco abuse - counseled on smoking cessation.  Complicates overall health and long term prognosis.  Patient BMI: Body mass index is 23.87 kg/m.   DVT prophylaxis: Lovenox  Diet:  Diet Orders (From admission, onward)    Start     Ordered   02/22/20 1200  Diet clear liquid Room service appropriate? Yes; Fluid consistency: Thin  Diet effective now    Question Answer Comment  Room service appropriate? Yes   Fluid consistency: Thin      02/22/20 1159            Code Status: Full Code    Subjective 02/22/20    Patient seen at bedside this AM.  No acute events reported.  Patient reports morphine does not work as well as dilaudid in controlling pain.  He asks when he can go home.  After discussion of being off all IV fluids and meds before discharge, he agreed to start on oral pain meds (tolerates other oral meds so far).  Denies nausea/vomiting.  Still has abdominal pain.  He attempted to advance diet yesterday but had significantly more pain afterward, requests to go back to just clear liquids for now, states he tolerates liquids pretty well.  No fever or chills.   Disposition Plan & Communication   Status is: Inpatient  Remains inpatient appropriate because:IV treatments appropriate due to intensity of illness or inability to take PO   Dispo: The patient is from: Home              Anticipated d/c is to: Home              Anticipated d/c date is: 2 days              Patient currently is  not medically stable to d/c.    Family Communication: none at bedside, patient to update    Consults, Procedures, Significant Events   Consultants:   None  Procedures:   None  Antimicrobials:   None     Objective   Vitals:   02/21/20 1607 02/21/20 1944 02/22/20 0419 02/22/20 0651  BP: (!) 140/98 (!) 141/99 (!) 153/103 126/77  Pulse: 87 88 79 80  Resp: 16 16 14    Temp: 98.4 F (36.9 C) 98.2 F (36.8 C) 97.7 F (36.5 C)   TempSrc: Oral Oral Oral   SpO2: 100% 100% 100% 100%  Weight:      Height:        Intake/Output Summary (Last 24 hours) at 02/22/2020 1508 Last data filed at 02/22/2020 1200 Gross per 24 hour  Intake 2786.74 ml  Output 1000 ml  Net 1786.74 ml   Filed Weights   02/20/20 1820 02/21/20 0442  Weight: 83.9 kg 86.6 kg    Physical Exam:  General exam: awake, alert, no acute distress HEENT: clear conjunctiva, anicteric sclera, moist mucus membranes, hearing grossly normal  Respiratory system: CTAB, no wheezes, rales or rhonchi, normal respiratory effort. Cardiovascular system: normal S1/S2, RRR, no pedal edema.   Gastrointestinal system: soft, tender epigastrum without guarding or rebound tenderness Central nervous system: A&O x4. no gross focal neurologic deficits, normal speech Extremities: moves all, no edema, normal tone Psychiatry: normal mood, congruent affect, judgement and insight appear normal  Labs   Data Reviewed: I have personally reviewed following labs and imaging studies  CBC: Recent Labs  Lab 02/17/20 1805 02/18/20 0055 02/20/20 1822 02/21/20 0531  WBC 19.0* 16.7* 7.0 5.9  NEUTROABS  --  14.9*  --   --   HGB 15.7 13.8 14.3 12.0*  HCT 45.0 38.6* 41.6 33.7*  MCV 80.8 80.2 81.7 79.3*  PLT 173 150 244 230   Basic Metabolic Panel: Recent Labs  Lab 02/17/20 1805 02/18/20 0055 02/20/20 1822 02/21/20 0531 02/22/20 0529  NA 138 136 138 139 141  K 3.1* 3.4* 5.0 3.6 3.7  CL 98 100 102 105 108  CO2 25 27 25 26 23     GLUCOSE 136* 120* 163* 104* 94  BUN 13 10 7 9 6   CREATININE 0.77 0.70 0.94 0.86 0.75  CALCIUM 9.2 8.6* 10.0 8.7* 8.8*  MG  --  2.0  --   --   --   PHOS  --  4.1  --   --   --    GFR: Estimated Creatinine Clearance: 151.1 mL/min (by C-G formula based on SCr of 0.75 mg/dL). Liver Function Tests: Recent Labs  Lab 02/17/20 1805 02/18/20 0055 02/20/20 1822  AST 29 22 17   ALT 33 26 20  ALKPHOS 70 58 79  BILITOT 1.1  0.9 0.7  PROT 7.9 6.7 8.3*  ALBUMIN 4.4 3.7 4.2   Recent Labs  Lab 02/17/20 1805 02/18/20 0055 02/20/20 1822 02/21/20 0531 02/22/20 0529  LIPASE 162* 94* 35 33 29   No results for input(s): AMMONIA in the last 168 hours. Coagulation Profile: Recent Labs  Lab 02/18/20 0055  INR 1.0   Cardiac Enzymes: No results for input(s): CKTOTAL, CKMB, CKMBINDEX, TROPONINI in the last 168 hours. BNP (last 3 results) No results for input(s): PROBNP in the last 8760 hours. HbA1C: No results for input(s): HGBA1C in the last 72 hours. CBG: No results for input(s): GLUCAP in the last 168 hours. Lipid Profile: No results for input(s): CHOL, HDL, LDLCALC, TRIG, CHOLHDL, LDLDIRECT in the last 72 hours. Thyroid Function Tests: No results for input(s): TSH, T4TOTAL, FREET4, T3FREE, THYROIDAB in the last 72 hours. Anemia Panel: No results for input(s): VITAMINB12, FOLATE, FERRITIN, TIBC, IRON, RETICCTPCT in the last 72 hours. Sepsis Labs: No results for input(s): PROCALCITON, LATICACIDVEN in the last 168 hours.  Recent Results (from the past 240 hour(s))  Respiratory Panel by RT PCR (Flu A&B, Covid) - Nasopharyngeal Swab     Status: None   Collection Time: 02/18/20 12:26 AM   Specimen: Nasopharyngeal Swab  Result Value Ref Range Status   SARS Coronavirus 2 by RT PCR NEGATIVE NEGATIVE Final    Comment: (NOTE) SARS-CoV-2 target nucleic acids are NOT DETECTED. The SARS-CoV-2 RNA is generally detectable in upper respiratoy specimens during the acute phase of infection. The  lowest concentration of SARS-CoV-2 viral copies this assay can detect is 131 copies/mL. A negative result does not preclude SARS-Cov-2 infection and should not be used as the sole basis for treatment or other patient management decisions. A negative result may occur with  improper specimen collection/handling, submission of specimen other than nasopharyngeal swab, presence of viral mutation(s) within the areas targeted by this assay, and inadequate number of viral copies (<131 copies/mL). A negative result must be combined with clinical observations, patient history, and epidemiological information. The expected result is Negative. Fact Sheet for Patients:  PinkCheek.be Fact Sheet for Healthcare Providers:  GravelBags.it This test is not yet ap proved or cleared by the [place] FDA and  has been authorized for detection and/or diagnosis of SARS-CoV-2 by FDA under an Emergency Use Authorization (EUA). This EUA will remain  in effect (meaning this test can be used) for the duration of the COVID-19 declaration under Section 564(b)(1) of the Act, 21 U.S.C. section 360bbb-3(b)(1), unless the authorization is terminated or revoked sooner.    Influenza A by PCR NEGATIVE NEGATIVE Final   Influenza B by PCR NEGATIVE NEGATIVE Final    Comment: (NOTE) The Xpert Xpress SARS-CoV-2/FLU/RSV assay is intended as an aid in  the diagnosis of influenza from Nasopharyngeal swab specimens and  should not be used as a sole basis for treatment. Nasal washings and  aspirates are unacceptable for Xpert Xpress SARS-CoV-2/FLU/RSV  testing. Fact Sheet for Patients: PinkCheek.be Fact Sheet for Healthcare Providers: GravelBags.it This test is not yet approved or cleared by the [place] FDA and  has been authorized for detection and/or diagnosis of SARS-CoV-2 by  FDA under an Emergency  Use Authorization (EUA). This EUA will remain  in effect (meaning this test can be used) for the duration of the  Covid-19 declaration under Section 564(b)(1) of the Act, 21  U.S.C. section 360bbb-3(b)(1), unless the authorization is  terminated or revoked. Performed at [facility], [address] [place],  [place] [address]       Imaging Studies   No results found.   Medications   Scheduled Meds: . amLODipine  10 mg Oral Daily  . chlordiazePOXIDE  25 mg Oral TID  . enoxaparin (LOVENOX) injection  40 mg Subcutaneous Q24H  . folic acid  1 mg Oral Daily  . mirtazapine  15 mg Oral QHS  . multivitamins with iron  1 tablet Oral Daily  . pantoprazole (PROTONIX) IV  40 mg Intravenous Q12H  . sertraline  100 mg Oral Q1500  . sodium chloride flush  3 mL Intravenous Once  . thiamine  100 mg Oral Daily   Or  . thiamine  100 mg Intravenous Daily   Continuous Infusions:     LOS: 1 day    Time spent: 40 minutes with > 50% spent in coordination of care and in direct patient care and counseling.    Pennie Banter, DO [facility]  02/22/2020, 3:08 PM    If 7PM-7AM, please contact night-coverage. How to contact the [facility] Attending or Consulting provider 7A - 7P or covering provider during after hours 7P -7A, for this patient?    1. Check the care team in [facility] and look for a) attending/consulting [facility] provider listed and b) the [facility] team listed 2. Log into [email or web address] and use [place]'s universal password to access. If you do not have the password, please contact the hospital operator. 3. Locate the [facility] provider you are looking for under [facility] and page to a number that you can be directly reached. 4. If you still have difficulty reaching the provider, please page the [facility] (Director on Call) for the Hospitalists listed on amion for assistance.

## 2020-02-22 NOTE — Hospital Course (Signed)
Mario Beck  is a 39 y.o. African-American male with a history of asthma, alcohol abuse, hypertension and depression, who presented to the ED on 02/21/20 with intractable nausea, vomiting and abdominal pain.  He had recently been admitted for same, but left AMA on 5/3.  Had persistent severe symptoms, prompting him to return.    In the ED, tachycardic 117 bpm, hypertensive 160/110 with otherwise normal vitals.  Labs notable for no leukocytosis (wbc 7k down from 16.7 ok 5/4).  UA showed 6-10 WBC's and hyaline casts.  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  Treated with IV Dilaudid, IV Zofran and 1 L normal saline bolus.  Admitted to hospitalist service for further management.

## 2020-02-23 MED ORDER — THIAMINE HCL 100 MG PO TABS: 100.0000 mg | ORAL_TABLET | Freq: Every day | ORAL | 2 refills | Status: DC

## 2020-02-23 MED ORDER — CHLORDIAZEPOXIDE HCL 25 MG PO CAPS: ORAL_CAPSULE | ORAL | 0 refills | Status: AC

## 2020-02-23 MED ORDER — ONDANSETRON HCL 4 MG PO TABS: 4.0000 mg | ORAL_TABLET | Freq: Four times a day (QID) | ORAL | 1 refills | Status: DC | PRN

## 2020-02-23 MED ORDER — SERTRALINE HCL 100 MG PO TABS: 100.0000 mg | ORAL_TABLET | Freq: Every day | ORAL | 2 refills | Status: DC

## 2020-02-23 MED ORDER — OXYCODONE HCL 5 MG PO TABS: ORAL_TABLET | ORAL | 0 refills | Status: AC

## 2020-02-23 MED ORDER — MIRTAZAPINE 15 MG PO TABS
15.0000 mg | ORAL_TABLET | Freq: Every day | ORAL | 2 refills | Status: DC
Start: 2020-02-23 — End: 2021-05-28

## 2020-02-23 MED ORDER — PANTOPRAZOLE SODIUM 40 MG PO TBEC
40.0000 mg | DELAYED_RELEASE_TABLET | Freq: Two times a day (BID) | ORAL | 2 refills | Status: DC
Start: 2020-02-23 — End: 2020-04-03

## 2020-02-23 MED ORDER — FOLIC ACID 1 MG PO TABS: 1.0000 mg | ORAL_TABLET | Freq: Every day | ORAL | 2 refills | Status: DC

## 2020-02-23 MED ORDER — GABAPENTIN 300 MG PO CAPS: 300.0000 mg | ORAL_CAPSULE | Freq: Four times a day (QID) | ORAL | 2 refills | Status: DC

## 2020-02-23 NOTE — Plan of Care (Signed)
Discharge teaching completed with patient who verbalized understanding of teaching.  Patient is in stable condition and has all belongings. 

## 2020-02-23 NOTE — Discharge Summary (Signed)
Physician Discharge Summary  Mario Beck HYW:[id number] DOB: 1981/04/28 DOA: 02/20/2020  PCP: Patient, No Pcp Per  Admit date: 02/20/2020 Discharge date: 02/23/2020  Admitted From: home Disposition:  home  Recommendations for Outpatient Follow-up:  1. Follow up with PCP in 1-2 weeks 2. Please obtain BMP/CBC in one week 3. Please follow up with local resources that social worker provided you for support in stopping drinking alcohol.  Home Health: No  Equipment/Devices: None   Discharge Condition: Stable  CODE STATUS: Full  Diet recommendation: Full or Clear liquids and advance as tolerated, starting with soft, low fat foods.   Discharge Diagnoses: Active Problems:   Nausea and vomiting   Intractable nausea and vomiting    Summary of HPI and Hospital Course:  Mario Beck  is a 39 y.o. African-American male with a history of asthma, alcohol abuse, hypertension and depression, who presented to the ED on 02/21/20 with intractable nausea, vomiting and abdominal pain.  He had recently been admitted for same, but left AMA on 5/3.  Had persistent severe symptoms, prompting him to return.    In the ED, tachycardic 117 bpm, hypertensive 160/110 with otherwise normal vitals.  Labs notable for no leukocytosis (wbc 7k down from 16.7 ok 5/4).  UA showed 6-10 WBC's and hyaline casts.  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  Treated with IV Dilaudid, IV Zofran and 1 L normal saline bolus.  Admitted to hospitalist service for further management.    Alcohol withdrawal, uncomplicated - present on admission.  Doing well on Librium.  Was covered with PRN Ativan per [facility] protocol in addition to Librium.  Discharge with 3 day Librium taper.   Provided substance abuse recovery resources by social work.  States he has a good support network to help as well.    Acute alcoholic pancreatitis and duodenitis - with abdominal pain, nausea and vomiting present on admission.  Treated with IV fluids, pain control, antiemetics.  Pain improving but advancing diet slowly.  Does well with full liquids and very bland foods.    GERD - takes Nexium at home.  Treated with IV PPI during admission.  Essentialhypertension - continue home Norvasc, clonidine and HCTZ.  Peripheral neuropathy - unclear cause, ?alcohol abuse.  Continue gabapentin.  Depression/Anxiety - continue home Zoloft and Remeron.  Hold home PRN Vistaril for now while on benzo.  Insomnia - continue home trazodone at 50 mg given use of benzo's, home dose is 100 mg.  Tobacco abuse - counseled on smoking cessation.  Complicates overall health and long term prognosis.     Discharge Instructions   Discharge Instructions    Call MD for:  extreme fatigue   Complete by: As directed    Call MD for:  persistant nausea and vomiting   Complete by: As directed    Call MD for:  severe uncontrolled pain   Complete by: As directed    Call MD for:  temperature >100.4   Complete by: As directed    Diet - low sodium heart healthy   Complete by: As directed    Discharge instructions   Complete by: As directed    Advance your diet slowly.  Eat LOW FAT, bland foods because these are most likely to make pain worse (potatoes, rice, chicken, etc).  Please seek support if needed to help with remaining off alcohol.  This is THE most  important thing you can do to prevent having recurrent episodes of pancreatitis, and for your long term health and wellbeing.    Take the Librium (generic name chlordiazepoxide) as prescribed over next 3 days to help prevent symptoms of alcohol withdrawal.   Increase activity slowly   Complete by: As directed      Allergies as of 02/23/2020      Reactions   Lisinopril Swelling   Angioedema   Shellfish Allergy       Medication List    STOP taking these medications    sucralfate 1 g tablet Commonly known as: Carafate     TAKE these medications   acetaminophen 500 MG tablet Commonly known as: TYLENOL Take 500 mg by mouth every 6 (six) hours as needed.   amLODipine 10 MG tablet Commonly known as: NORVASC Take 1 tablet (10 mg total) by mouth daily. What changed: how much to take   chlordiazePOXIDE 25 MG capsule Commonly known as: LIBRIUM Take 1 capsule (25 mg total) by mouth 3 (three) times daily for 1 day, THEN 1 capsule (25 mg total) in the morning and at bedtime for 1 day, THEN 1 capsule (25 mg total) daily for 1 day. Start taking on: Feb 23, 2020   folic acid 1 MG tablet Commonly known as: FOLVITE Take 1 tablet (1 mg total) by mouth daily. Start taking on: Feb 24, 2020   gabapentin 300 MG capsule Commonly known as: NEURONTIN Take 1 capsule (300 mg total) by mouth 4 (four) times daily.   mirtazapine 15 MG tablet Commonly known as: Remeron Take 1 tablet (15 mg total) by mouth at bedtime.   ondansetron 4 MG tablet Commonly known as: ZOFRAN Take 1 tablet (4 mg total) by mouth every 6 (six) hours as needed for nausea or vomiting. What changed: when to take this   oxyCODONE 5 MG immediate release tablet Commonly known as: Oxy IR/ROXICODONE May take 2 tablets (10 mg total) by mouth every 4 (four) hours as needed for moderate pain or severe pain. May also take 1 tablet (5 mg total) every 4 (four) hours as needed for moderate pain or severe pain. Do all this for 3 days.   pantoprazole 40 MG tablet Commonly known as: Protonix Take 1 tablet (40 mg total) by mouth 2 (two) times daily.   sertraline 100 MG tablet Commonly known as: ZOLOFT Take 1 tablet (100 mg total) by mouth daily in the afternoon.   thiamine 100 MG tablet Take 1 tablet (100 mg total) by mouth daily. Start taking on: Feb 24, 2020       Allergies  Allergen Reactions  . Lisinopril Swelling    Angioedema   . Shellfish Allergy      Consultations:  None    Procedures/Studies: CT Abdomen Pelvis W Contrast  Result Date: 02/17/2020 CLINICAL DATA:  Abdomen pain history of pancreatitis EXAM: CT ABDOMEN AND PELVIS WITH CONTRAST TECHNIQUE: Multidetector CT imaging of the abdomen and pelvis was performed using the standard protocol following bolus administration of intravenous contrast. CONTRAST:  OMNIPAQUE IOHEXOL 300 MG/ML  SOLN COMPARISON:  Chest CT 02/01/2018 FINDINGS: Lower chest: Mild gynecomastia. No consolidation or pleural effusion. Mild cardiomegaly. Hepatobiliary: No focal liver abnormality is seen. No gallstones, gallbladder wall thickening, or biliary dilatation. Pancreas: Moderate diffuse inflammatory changes and fluid consistent with acute pancreatitis. Relative area of hypodensity at the pancreatic head measuring 3.1 cm transverse by 2.4 cm craniocaudad suspected to represent developing organizing fluid collection. Spleen: Normal in size  without focal abnormality. Adrenals/Urinary Tract: Adrenal glands show stable 14 mm left adrenal nodule and 17 mm right adrenal nodule. Subcentimeter hypodensities within the left kidney too small to further characterize. No hydronephrosis. The bladder is normal Stomach/Bowel: Small amount of hyperdense material in the stomach. Wall thickening and inflammatory change of the duodenum. No dilated small bowel. No colon wall thickening. Negative appendix. Vascular/Lymphatic: Nonaneurysmal aorta. No suspicious adenopathy. Normal enhancement of the portal vein. Slightly narrowed appearance of the splenic and superior mesenteric veins near the confluence though without thrombus. Reproductive: Prostate is unremarkable. Other: No free air.  Free fluid in the pelvis and upper abdomen Musculoskeletal: No acute or significant osseous findings. IMPRESSION: 1. Findings consistent with acute pancreatitis. Hypoenhancing collection at the pancreatic head measuring up to 3.1 cm, suspect for developing  organized fluid collection. Narrowed appearance of the splenic vein and superior mesenteric vein near the confluence but without definitive thrombus at this time. 2. Wall thickening and inflammatory change involving duodenum consistent with duodenitis and reactive inflammation from the pancreas 3. Small amount of free fluid in the abdomen and pelvis. 4. Bilateral adrenal gland nodules probably adenomas. Electronically Signed   By: Jasmine Pang M.D.   On: 02/17/2020 20:09     Subjective: Patient seen this AM.  Reports he feels well.  No tremors, nervousness, palpitations.  He is finding some foods that he tolerates.  No fever or chills.   Discharge Exam: Vitals:   02/23/20 0502 02/23/20 0630  BP: (!) 130/99 100/65  Pulse: 79 81  Resp: 18   Temp: 97.7 F (36.5 C)   SpO2: 100%    Vitals:   02/22/20 2234 02/22/20 2351 02/23/20 0502 02/23/20 0630  BP: (!) 142/104 (!) 140/95 (!) 130/99 100/65  Pulse: 97 88 79 81  Resp:   18   Temp:   97.7 F (36.5 C)   TempSrc:   Oral   SpO2:   100%   Weight:      Height:        General: Pt is alert, awake, not in acute distress Cardiovascular: RRR, S1/S2 +, no rubs, no gallops Respiratory: CTA bilaterally, no wheezing, no rhonchi Abdominal: Soft, NT, ND, bowel sounds + Extremities: no edema, no cyanosis    The results of significant diagnostics from this hospitalization (including imaging, microbiology, ancillary and laboratory) are listed below for reference.     Microbiology: Recent Results (from the past 240 hour(s))  Respiratory Panel by RT PCR (Flu A&B, Covid) - Nasopharyngeal Swab     Status: None   Collection Time: 02/18/20 12:26 AM   Specimen: Nasopharyngeal Swab  Result Value Ref Range Status   SARS Coronavirus 2 by RT PCR NEGATIVE NEGATIVE Final    Comment: (NOTE) SARS-CoV-2 target nucleic acids are NOT DETECTED. The SARS-CoV-2 RNA is generally detectable in upper respiratoy specimens during the acute phase of infection. The  lowest concentration of SARS-CoV-2 viral copies this assay can detect is 131 copies/mL. A negative result does not preclude SARS-Cov-2 infection and should not be used as the sole basis for treatment or other patient management decisions. A negative result may occur with  improper specimen collection/handling, submission of specimen other than nasopharyngeal swab, presence of viral mutation(s) within the areas targeted by this assay, and inadequate number of viral copies (<131 copies/mL). A negative result must be combined with clinical observations, patient history, and epidemiological information. The expected result is Negative. Fact Sheet for Patients:  https://www.moore.com/ Fact Sheet for Healthcare Providers:  https://www.young.biz/  This test is not yet ap proved or cleared by the [place] and  has been authorized for detection and/or diagnosis of SARS-CoV-2 by FDA under an Emergency Use Authorization (EUA). This EUA will remain  in effect (meaning this test can be used) for the duration of the COVID-19 declaration under Section 564(b)(1) of the Act, 21 U.S.C. section 360bbb-3(b)(1), unless the authorization is terminated or revoked sooner.    Influenza A by PCR NEGATIVE NEGATIVE Final   Influenza B by PCR NEGATIVE NEGATIVE Final    Comment: (NOTE) The Xpert Xpress SARS-CoV-2/FLU/RSV assay is intended as an aid in  the diagnosis of influenza from Nasopharyngeal swab specimens and  should not be used as a sole basis for treatment. Nasal washings and  aspirates are unacceptable for Xpert Xpress SARS-CoV-2/FLU/RSV  testing. Fact Sheet for Patients: PinkCheek.be Fact Sheet for Healthcare Providers: GravelBags.it This test is not yet approved or cleared by the [place] FDA and  has been authorized for detection and/or diagnosis of SARS-CoV-2 by  FDA under an Emergency  Use Authorization (EUA). This EUA will remain  in effect (meaning this test can be used) for the duration of the  Covid-19 declaration under Section 564(b)(1) of the Act, 21  U.S.C. section 360bbb-3(b)(1), unless the authorization is  terminated or revoked. Performed at [facility], [address] [place], [place] [address]      Labs: BNP (last 3 results) No results for input(s): BNP in the last 8760 hours. Basic Metabolic Panel: Recent Labs  Lab 02/17/20 1805 02/18/20 0055 02/20/20 1822 02/21/20 0531 02/22/20 0529  NA 138 136 138 139 141  K 3.1* 3.4* 5.0 3.6 3.7  CL 98 100 102 105 108  CO2 25 27 25 26 23   GLUCOSE 136* 120* 163* 104* 94  BUN 13 10 7 9 6   CREATININE 0.77 0.70 0.94 0.86 0.75  CALCIUM 9.2 8.6* 10.0 8.7* 8.8*  MG  --  2.0  --   --   --   PHOS  --  4.1  --   --   --    Liver Function Tests: Recent Labs  Lab 02/17/20 1805 02/18/20 0055 02/20/20 1822  AST 29 22 17   ALT 33 26 20  ALKPHOS 70 58 79  BILITOT 1.1 0.9 0.7  PROT 7.9 6.7 8.3*  ALBUMIN 4.4 3.7 4.2   Recent Labs  Lab 02/17/20 1805 02/18/20 0055 02/20/20 1822 02/21/20 0531 02/22/20 0529  LIPASE 162* 94* 35 33 29   No results for input(s): AMMONIA in the last 168 hours. CBC: Recent Labs  Lab 02/17/20 1805 02/18/20 0055 02/20/20 1822 02/21/20 0531  WBC 19.0* 16.7* 7.0 5.9  NEUTROABS  --  14.9*  --   --   HGB 15.7 13.8 14.3 12.0*  HCT 45.0 38.6* 41.6 33.7*  MCV 80.8 80.2 81.7 79.3*  PLT 173 150 244 230   Cardiac Enzymes: No results for input(s): CKTOTAL, CKMB, CKMBINDEX, TROPONINI in the last 168 hours. BNP: Invalid input(s): POCBNP CBG: No results for input(s): GLUCAP in the last 168 hours. D-Dimer No results for input(s): DDIMER in the last 72 hours. Hgb A1c No results for input(s): HGBA1C in the last 72 hours. Lipid Profile No results for input(s): CHOL, HDL, LDLCALC, TRIG, CHOLHDL, LDLDIRECT in the last 72 hours. Thyroid function studies No results for  input(s): TSH, T4TOTAL, T3FREE, THYROIDAB in the last 72 hours.  Invalid input(s): FREET3 Anemia work up No results for input(s): VITAMINB12, FOLATE, FERRITIN, TIBC, IRON,  RETICCTPCT in the last 72 hours. Urinalysis    Component Value Date/Time   COLORURINE YELLOW (A) 02/20/2020 1822   APPEARANCEUR CLOUDY (A) 02/20/2020 1822   LABSPEC 1.009 02/20/2020 1822   PHURINE 5.0 02/20/2020 1822   GLUCOSEU NEGATIVE 02/20/2020 1822   HGBUR NEGATIVE 02/20/2020 1822   BILIRUBINUR NEGATIVE 02/20/2020 1822   KETONESUR NEGATIVE 02/20/2020 1822   PROTEINUR 100 (A) 02/20/2020 1822   NITRITE NEGATIVE 02/20/2020 1822   LEUKOCYTESUR NEGATIVE 02/20/2020 1822   Sepsis Labs Invalid input(s): PROCALCITONIN,  WBC,  LACTICIDVEN Microbiology Recent Results (from the past 240 hour(s))  Respiratory Panel by RT PCR (Flu A&B, Covid) - Nasopharyngeal Swab     Status: None   Collection Time: 02/18/20 12:26 AM   Specimen: Nasopharyngeal Swab  Result Value Ref Range Status   SARS Coronavirus 2 by RT PCR NEGATIVE NEGATIVE Final    Comment: (NOTE) SARS-CoV-2 target nucleic acids are NOT DETECTED. The SARS-CoV-2 RNA is generally detectable in upper respiratoy specimens during the acute phase of infection. The lowest concentration of SARS-CoV-2 viral copies this assay can detect is 131 copies/mL. A negative result does not preclude SARS-Cov-2 infection and should not be used as the sole basis for treatment or other patient management decisions. A negative result may occur with  improper specimen collection/handling, submission of specimen other than nasopharyngeal swab, presence of viral mutation(s) within the areas targeted by this assay, and inadequate number of viral copies (<131 copies/mL). A negative result must be combined with clinical observations, patient history, and epidemiological information. The expected result is Negative. Fact Sheet for Patients:  https://www.moore.com/ Fact  Sheet for Healthcare Providers:  https://www.young.biz/ This test is not yet ap proved or cleared by the [place] FDA and  has been authorized for detection and/or diagnosis of SARS-CoV-2 by FDA under an Emergency Use Authorization (EUA). This EUA will remain  in effect (meaning this test can be used) for the duration of the COVID-19 declaration under Section 564(b)(1) of the Act, 21 U.S.C. section 360bbb-3(b)(1), unless the authorization is terminated or revoked sooner.    Influenza A by PCR NEGATIVE NEGATIVE Final   Influenza B by PCR NEGATIVE NEGATIVE Final    Comment: (NOTE) The Xpert Xpress SARS-CoV-2/FLU/RSV assay is intended as an aid in  the diagnosis of influenza from Nasopharyngeal swab specimens and  should not be used as a sole basis for treatment. Nasal washings and  aspirates are unacceptable for Xpert Xpress SARS-CoV-2/FLU/RSV  testing. Fact Sheet for Patients: https://www.moore.com/ Fact Sheet for Healthcare Providers: https://www.young.biz/ This test is not yet approved or cleared by the [place] FDA and  has been authorized for detection and/or diagnosis of SARS-CoV-2 by  FDA under an Emergency Use Authorization (EUA). This EUA will remain  in effect (meaning this test can be used) for the duration of the  Covid-19 declaration under Section 564(b)(1) of the Act, 21  U.S.C. section 360bbb-3(b)(1), unless the authorization is  terminated or revoked. Performed at [facility], [address] [place], [place] [address]      Time coordinating discharge: Over 30 minutes  SIGNED:   Pennie Banter, DO [facility] 02/23/2020, 10:19 AM   If 7PM-7AM, please contact night-coverage [email or web address]

## 2020-03-30 ENCOUNTER — Emergency Department: Payer: Self-pay

## 2020-03-30 ENCOUNTER — Other Ambulatory Visit: Payer: Self-pay

## 2020-03-30 ENCOUNTER — Emergency Department
Admission: EM | Admit: 2020-03-30 | Discharge: 2020-03-30 | Disposition: A | Discharge status: Home / Self Care | Payer: Self-pay | Attending: Student | Admitting: Student

## 2020-03-30 DIAGNOSIS — R1010 Upper abdominal pain, unspecified: Secondary | ICD-10-CM | POA: Insufficient documentation

## 2020-03-30 DIAGNOSIS — Y906 Blood alcohol level of 120-199 mg/100 ml: Secondary | ICD-10-CM | POA: Insufficient documentation

## 2020-03-30 DIAGNOSIS — R7989 Other specified abnormal findings of blood chemistry: Secondary | ICD-10-CM

## 2020-03-30 DIAGNOSIS — F17210 Nicotine dependence, cigarettes, uncomplicated: Secondary | ICD-10-CM | POA: Insufficient documentation

## 2020-03-30 DIAGNOSIS — Z789 Other specified health status: Secondary | ICD-10-CM

## 2020-03-30 DIAGNOSIS — F1099 Alcohol use, unspecified with unspecified alcohol-induced disorder: Secondary | ICD-10-CM | POA: Insufficient documentation

## 2020-03-30 DIAGNOSIS — R945 Abnormal results of liver function studies: Secondary | ICD-10-CM | POA: Insufficient documentation

## 2020-03-30 DIAGNOSIS — Z79899 Other long term (current) drug therapy: Secondary | ICD-10-CM | POA: Insufficient documentation

## 2020-03-30 DIAGNOSIS — I10 Essential (primary) hypertension: Secondary | ICD-10-CM | POA: Insufficient documentation

## 2020-03-30 DIAGNOSIS — Z7289 Other problems related to lifestyle: Secondary | ICD-10-CM

## 2020-03-30 LAB — CBC
HCT: 40.5 % (ref 39.0–52.0)
Hemoglobin: 15.2 g/dL (ref 13.0–17.0)
MCH: 28.4 pg (ref 26.0–34.0)
MCHC: 37.5 g/dL — ABNORMAL HIGH (ref 30.0–36.0)
MCV: 75.6 fL — ABNORMAL LOW (ref 80.0–100.0)
Platelets: 278 10*3/uL (ref 150–400)
RBC: 5.36 MIL/uL (ref 4.22–5.81)
RDW: 14.1 % (ref 11.5–15.5)
WBC: 6.6 10*3/uL (ref 4.0–10.5)
nRBC: 0 % (ref 0.0–0.2)

## 2020-03-30 LAB — URINALYSIS, COMPLETE (UACMP) WITH MICROSCOPIC
Bacteria, UA: NONE SEEN
Bilirubin Urine: NEGATIVE
Glucose, UA: NEGATIVE mg/dL
Ketones, ur: NEGATIVE mg/dL
Leukocytes,Ua: NEGATIVE
Nitrite: NEGATIVE
Protein, ur: NEGATIVE mg/dL
Specific Gravity, Urine: 1.009 (ref 1.005–1.030)
pH: 5 (ref 5.0–8.0)

## 2020-03-30 LAB — LIPASE, BLOOD: Lipase: 21 U/L (ref 11–51)

## 2020-03-30 LAB — COMPREHENSIVE METABOLIC PANEL
ALT: 450 U/L — ABNORMAL HIGH (ref 0–44)
AST: 459 U/L — ABNORMAL HIGH (ref 15–41)
Albumin: 4 g/dL (ref 3.5–5.0)
Alkaline Phosphatase: 185 U/L — ABNORMAL HIGH (ref 38–126)
Anion gap: 15 (ref 5–15)
BUN: 12 mg/dL (ref 6–20)
CO2: 23 mmol/L (ref 22–32)
Calcium: 9.1 mg/dL (ref 8.9–10.3)
Chloride: 98 mmol/L (ref 98–111)
Creatinine, Ser: 0.86 mg/dL (ref 0.61–1.24)
GFR calc Af Amer: >60 mL/min (ref >60)
GFR calc non Af Amer: >60 mL/min (ref >60)
Glucose, Bld: 111 mg/dL — ABNORMAL HIGH (ref 70–99)
Potassium: 3.7 mmol/L (ref 3.5–5.1)
Sodium: 136 mmol/L (ref 135–145)
Total Bilirubin: 1.1 mg/dL (ref 0.3–1.2)
Total Protein: 7.3 g/dL (ref 6.5–8.1)

## 2020-03-30 LAB — ETHANOL: Alcohol, Ethyl (B): 180 mg/dL — ABNORMAL HIGH (ref <10)

## 2020-03-30 LAB — ACETAMINOPHEN LEVEL: Acetaminophen (Tylenol), Serum: <10 ug/mL — ABNORMAL LOW (ref 10–30)

## 2020-03-30 LAB — HEPATITIS PANEL, ACUTE
HCV Ab: NONREACTIVE
Hep A IgM: NONREACTIVE
Hep B C IgM: NONREACTIVE
Hepatitis B Surface Ag: NONREACTIVE

## 2020-03-30 MED ORDER — CHLORDIAZEPOXIDE HCL 25 MG PO CAPS: ORAL_CAPSULE | ORAL | 0 refills | Status: DC

## 2020-03-30 MED ORDER — LACTATED RINGERS IV BOLUS
1000.0000 mL | Freq: Once | INTRAVENOUS | Status: AC
  Administered 2020-03-30: 1000 mL via INTRAVENOUS

## 2020-03-30 MED ORDER — MORPHINE SULFATE (PF) 4 MG/ML IV SOLN
4.0000 mg | Freq: Once | INTRAVENOUS | Status: AC
  Administered 2020-03-30: 4 mg via INTRAVENOUS
  Filled 2020-03-30: qty 1

## 2020-03-30 MED ORDER — THIAMINE HCL 100 MG PO TABS
100.0000 mg | ORAL_TABLET | Freq: Once | ORAL | Status: AC
  Administered 2020-03-30: 100 mg via ORAL
  Filled 2020-03-30: qty 1

## 2020-03-30 MED ORDER — SODIUM CHLORIDE 0.9% FLUSH: 3.0000 mL | Freq: Once | INTRAVENOUS | Status: DC

## 2020-03-30 MED ORDER — FOLIC ACID 1 MG PO TABS
1.0000 mg | ORAL_TABLET | Freq: Once | ORAL | Status: AC
  Administered 2020-03-30: 1 mg via ORAL
  Filled 2020-03-30: qty 1

## 2020-03-30 MED ORDER — PANTOPRAZOLE SODIUM 40 MG IV SOLR
40.0000 mg | Freq: Once | INTRAVENOUS | Status: AC
  Administered 2020-03-30: 40 mg via INTRAVENOUS
  Filled 2020-03-30: qty 40

## 2020-03-30 MED ORDER — ONDANSETRON HCL 4 MG/2ML IJ SOLN
4.0000 mg | Freq: Once | INTRAMUSCULAR | Status: AC
  Administered 2020-03-30: 4 mg via INTRAVENOUS
  Filled 2020-03-30: qty 2

## 2020-03-30 NOTE — ED Provider Notes (Signed)
[facility] Emergency Department Provider Note  ____________________________________________   First MD Initiated Contact with Patient 03/30/20 1614     (approximate)  I have reviewed the triage vital signs and the nursing notes.  History  Chief Complaint Abdominal Pain    HPI Mario Beck is a 39 y.o. male past medical history as below who presents to the emergency department for upper abdominal pain in the setting of increased alcohol use.  Patient states he has a history of unhealthy alcohol intake, but has had this under control for quite some time.  Unfortunately he recently found out some "devastating news about his son" and in order to cope he returned to drinking.  States for the last week he has drank about a pint to 1/5 of liquor a day.  As a result he is experiencing upper abdominal pain, which he suspects is pancreatitis.  Has a history of pancreatitis and states symptoms are similar.  Burning, aching, sharp pain in the mid/upper abdomen.  No radiation.  No alleviating components, worsened by drinking.  Reports nausea and several episodes of nonbloody, nonbilious emesis.  Difficulty tolerating by mouth.  Also experiencing diarrhea, nonbloody.  Does want to stop drinking.  Denies any SI.   Past Medical Hx Past Medical History:  Diagnosis Date  . Alcohol abuse   . Asthma   . Depression   . Hypertension     Problem List Patient Active Problem List   Diagnosis Date Noted  . Nausea and vomiting 02/21/2020  . Intractable nausea and vomiting 02/21/2020  . Tobacco abuse counseling   . Acute alcoholic pancreatitis 52/84/1324  . HTN (hypertension) 02/18/2020  . Alcohol use disorder, moderate, dependence ([place]) 02/18/2020  . Acute pancreatitis 02/18/2020    Past Surgical Hx Past Surgical History:  Procedure Laterality Date  . FRACTURE SURGERY     Left leg as a kid    Medications Prior to Admission medications   Medication Sig Start Date End  Date Taking? Authorizing Provider  acetaminophen (TYLENOL) 500 MG tablet Take 500 mg by mouth every 6 (six) hours as needed.    [provider]  amLODipine (NORVASC) 10 MG tablet Take 1 tablet (10 mg total) by mouth daily. Patient taking differently: Take 20 mg by mouth daily.  11/26/19   Khatri, Hina, PA-C  folic acid (FOLVITE) 1 MG tablet Take 1 tablet (1 mg total) by mouth daily. 02/24/20   Nicole Kindred A, DO  gabapentin (NEURONTIN) 300 MG capsule Take 1 capsule (300 mg total) by mouth 4 (four) times daily. 02/23/20   Nicole Kindred A, DO  mirtazapine (REMERON) 15 MG tablet Take 1 tablet (15 mg total) by mouth at bedtime. 02/23/20   Nicole Kindred A, DO  ondansetron (ZOFRAN) 4 MG tablet Take 1 tablet (4 mg total) by mouth every 6 (six) hours as needed for nausea or vomiting. 02/23/20   Ezekiel Slocumb, DO  pantoprazole (PROTONIX) 40 MG tablet Take 1 tablet (40 mg total) by mouth 2 (two) times daily. 02/23/20 05/23/20  Nicole Kindred A, DO  sertraline (ZOLOFT) 100 MG tablet Take 1 tablet (100 mg total) by mouth daily in the afternoon. 02/23/20   Nicole Kindred A, DO  thiamine 100 MG tablet Take 1 tablet (100 mg total) by mouth daily. 02/24/20   Ezekiel Slocumb, DO    Allergies Lisinopril and Shellfish allergy  Family Hx Family History  Problem Relation Age of Onset  . Colon cancer Neg Hx   .  Esophageal cancer Neg Hx     Social Hx Social History   Tobacco Use  . Smoking status: Current Every Day Smoker    Packs/day: 1.00    Types: Cigarettes  . Smokeless tobacco: Current User    Types: Snuff  . Tobacco comment: Does pouches as welll   Vaping Use  . Vaping Use: Never used  Substance Use Topics  . Alcohol use: Yes    Comment: half a pint  . Drug use: No     Review of Systems  Constitutional: Negative for fever. Negative for chills. Eyes: Negative for visual changes. ENT: Negative for sore throat. Cardiovascular: Negative for chest pain. Respiratory: Negative for  shortness of breath. Gastrointestinal: + abdominal pain, nausea, vomiting Genitourinary: Negative for dysuria. Musculoskeletal: Negative for leg swelling. Skin: Negative for rash. Neurological: Negative for headaches.   Physical Exam  Vital Signs: ED Triage Vitals  Enc Vitals Group     BP 03/30/20 1418 136/89     Pulse Rate 03/30/20 1418 (!) 114     Resp 03/30/20 1418 19     Temp 03/30/20 1418 98.5 F (36.9 C)     Temp src --      SpO2 03/30/20 1418 100 %     Weight 03/30/20 1419 190 lb (86.2 kg)     Height 03/30/20 1419 '6\' 3"'  (1.905 m)     Head Circumference --      Peak Flow --      Pain Score 03/30/20 1419 10     Pain Loc --      Pain Edu? --      Excl. in [place]? --     Constitutional: Alert and oriented. Well appearing. NAD.  Head: Normocephalic. Atraumatic. Eyes: Conjunctivae clear. Sclera anicteric. Pupils equal and symmetric. Nose: No masses or lesions. No congestion or rhinorrhea. Mouth/Throat: Wearing mask.  Neck: No stridor. Trachea midline.  Cardiovascular: Normal rate, regular rhythm. Extremities well perfused. Respiratory: Normal respiratory effort.  Lungs CTAB. Gastrointestinal: Soft. Non-distended. Mid upper abdominal TTP, no rebound, guarding, rigidity.  Genitourinary: Deferred. Musculoskeletal: No lower extremity edema. No deformities. Neurologic:  Normal speech and language. No gross focal or lateralizing neurologic deficits are appreciated.  Skin: Skin is warm, dry and intact. No rash noted. Psychiatric: Tearful when discussing his son and relapse. Appropriate. Denies SI. Does want to stop drinking.   Radiology  Personally reviewed available imaging myself.   Ultrasound - IMPRESSION:  No acute findings on right upper quadrant ultrasound.    Procedures  Procedure(s) performed (including critical care):  Procedures   Initial Impression / Assessment and Plan / MDM / ED Course  39 y.o. male who presents to the ED for upper abdominal pain,  nausea, vomiting, diarrhea in the setting of alcohol use relapse  Ddx: alcohol use, alcoholic gastritis, pancreatitis, electrolyte abnormality, dehydration  Will plan for labs, imaging, fluids, symptom control  Labs reveal elevated LFTs and alk phos, which seems consistent with his alcohol use.  Normal bilirubin and lipase.  Added on acetaminophen level and hepatitis panel for completeness.  Alcohol level 180, though does seem clinically sober.  Ultrasound RUQ negative.  Patient feeling improved after medications and fluids.  Tolerated by mouth in the ER.  Attempted to give patient resources for outpatient assistance w/ alcohol cessation, however he would like to leave before TTS is available to discuss at bedside.  We discussed using a Librium taper to help with withdrawal symptoms given his desire to stop drinking, he would like  to try this.  Discussed the strict need for alcohol cessation while on the Librium taper due to oversedation if taking concurrently.  He voices clear understanding of this, and understands that if he should return to drinking he must stop the taper.  He is otherwise stable for discharge, he is clinically sober, ambulatory with a steady gait, demonstrates clear and linear thinking, tolerated by mouth.  Partner at bedside to drive him home.  Given return precautions.   _______________________________   As part of my medical decision making I have reviewed available labs, radiology tests, reviewed old records/performed chart review.   Final Clinical Impression(s) / ED Diagnosis  Final diagnoses:  Elevated LFTs  Alcohol use  Upper abdominal pain       Note:  This document was prepared using Dragon voice recognition software and may include unintentional dictation errors.   Lilia Pro., MD 03/30/20 [phone]

## 2020-03-30 NOTE — ED Triage Notes (Addendum)
Pt comes via POV from home with c/o abdominal pain. Pt states he drinks more than he should. Pt states N/V.  Pt states he has had 3 beers today. Pt states since finding out about his sons dx he has increased his drinking.  Pt states he hasn't eaten in about 3-4 days.

## 2020-03-30 NOTE — Discharge Instructions (Addendum)
Thank you for letting us take care of you in the emergency department today.   Please continue to take any regular, prescribed medications.   New medications we have prescribed:  Librium taper - to help w/ withdrawal symptoms.  Take as directed. As we discussed, if you return to drinking it is imperative that you stop the Librium taper, as a combination of this medication and alcohol can have dangerous side effects.  Please return to the ER for any new or worsening symptoms.  We wish you the best of luck!

## 2020-03-30 NOTE — ED Notes (Signed)
E-signature not working at this time. Pt verbalized understanding of D/C instructions, prescriptions and follow up care with no further questions at this time. Pt in NAD and ambulatory at time of D/C.  

## 2020-03-30 NOTE — ED Notes (Signed)
Patient transported to Ultrasound 

## 2020-03-31 ENCOUNTER — Emergency Department: Payer: Self-pay

## 2020-03-31 ENCOUNTER — Inpatient Hospital Stay
Admission: EM | Admit: 2020-03-31 | Discharge: 2020-04-03 | DRG: 439 | Disposition: A | Discharge status: Home / Self Care | Payer: Self-pay | Attending: Internal Medicine | Admitting: Internal Medicine

## 2020-03-31 ENCOUNTER — Other Ambulatory Visit: Payer: Self-pay

## 2020-03-31 DIAGNOSIS — E876 Hypokalemia: Secondary | ICD-10-CM | POA: Diagnosis not present

## 2020-03-31 DIAGNOSIS — F329 Major depressive disorder, single episode, unspecified: Secondary | ICD-10-CM | POA: Diagnosis present

## 2020-03-31 DIAGNOSIS — K852 Alcohol induced acute pancreatitis without necrosis or infection: Principal | ICD-10-CM | POA: Diagnosis present

## 2020-03-31 DIAGNOSIS — K7010 Alcoholic hepatitis without ascites: Secondary | ICD-10-CM | POA: Diagnosis present

## 2020-03-31 DIAGNOSIS — J45909 Unspecified asthma, uncomplicated: Secondary | ICD-10-CM | POA: Diagnosis present

## 2020-03-31 DIAGNOSIS — R7401 Elevation of levels of liver transaminase levels: Secondary | ICD-10-CM | POA: Diagnosis present

## 2020-03-31 DIAGNOSIS — F419 Anxiety disorder, unspecified: Secondary | ICD-10-CM | POA: Diagnosis present

## 2020-03-31 DIAGNOSIS — I10 Essential (primary) hypertension: Secondary | ICD-10-CM | POA: Diagnosis present

## 2020-03-31 DIAGNOSIS — F17210 Nicotine dependence, cigarettes, uncomplicated: Secondary | ICD-10-CM | POA: Diagnosis present

## 2020-03-31 DIAGNOSIS — F10239 Alcohol dependence with withdrawal, unspecified: Secondary | ICD-10-CM | POA: Diagnosis present

## 2020-03-31 DIAGNOSIS — Z91013 Allergy to seafood: Secondary | ICD-10-CM

## 2020-03-31 DIAGNOSIS — Z20822 Contact with and (suspected) exposure to covid-19: Secondary | ICD-10-CM | POA: Diagnosis present

## 2020-03-31 DIAGNOSIS — K2980 Duodenitis without bleeding: Secondary | ICD-10-CM | POA: Diagnosis present

## 2020-03-31 DIAGNOSIS — F1020 Alcohol dependence, uncomplicated: Secondary | ICD-10-CM

## 2020-03-31 DIAGNOSIS — Z79899 Other long term (current) drug therapy: Secondary | ICD-10-CM

## 2020-03-31 DIAGNOSIS — R112 Nausea with vomiting, unspecified: Secondary | ICD-10-CM | POA: Diagnosis present

## 2020-03-31 DIAGNOSIS — Z888 Allergy status to other drugs, medicaments and biological substances status: Secondary | ICD-10-CM

## 2020-03-31 DIAGNOSIS — G629 Polyneuropathy, unspecified: Secondary | ICD-10-CM | POA: Diagnosis present

## 2020-03-31 LAB — BASIC METABOLIC PANEL
Anion gap: 16 — ABNORMAL HIGH (ref 5–15)
BUN: 6 mg/dL (ref 6–20)
CO2: 22 mmol/L (ref 22–32)
Calcium: 9.2 mg/dL (ref 8.9–10.3)
Chloride: 97 mmol/L — ABNORMAL LOW (ref 98–111)
Creatinine, Ser: 0.88 mg/dL (ref 0.61–1.24)
GFR calc Af Amer: >60 mL/min (ref >60)
GFR calc non Af Amer: >60 mL/min (ref >60)
Glucose, Bld: 227 mg/dL — ABNORMAL HIGH (ref 70–99)
Potassium: 3.3 mmol/L — ABNORMAL LOW (ref 3.5–5.1)
Sodium: 135 mmol/L (ref 135–145)

## 2020-03-31 LAB — CBC WITH DIFFERENTIAL/PLATELET
Abs Immature Granulocytes: 0.01 10*3/uL (ref 0.00–0.07)
Basophils Absolute: 0 10*3/uL (ref 0.0–0.1)
Basophils Relative: 1 %
Eosinophils Absolute: 0.1 10*3/uL (ref 0.0–0.5)
Eosinophils Relative: 1 %
HCT: 43.1 % (ref 39.0–52.0)
Hemoglobin: 15.2 g/dL (ref 13.0–17.0)
Immature Granulocytes: 0 %
Lymphocytes Relative: 15 %
Lymphs Abs: 1 10*3/uL (ref 0.7–4.0)
MCH: 27.5 pg (ref 26.0–34.0)
MCHC: 35.3 g/dL (ref 30.0–36.0)
MCV: 77.9 fL — ABNORMAL LOW (ref 80.0–100.0)
Monocytes Absolute: 0.6 10*3/uL (ref 0.1–1.0)
Monocytes Relative: 9 %
Neutro Abs: 5.1 10*3/uL (ref 1.7–7.7)
Neutrophils Relative %: 74 %
Platelets: 253 10*3/uL (ref 150–400)
RBC: 5.53 MIL/uL (ref 4.22–5.81)
RDW: 14 % (ref 11.5–15.5)
WBC: 6.8 10*3/uL (ref 4.0–10.5)
nRBC: 0 % (ref 0.0–0.2)

## 2020-03-31 LAB — URINALYSIS, COMPLETE (UACMP) WITH MICROSCOPIC
Bacteria, UA: NONE SEEN
Bilirubin Urine: NEGATIVE
Glucose, UA: NEGATIVE mg/dL
Ketones, ur: NEGATIVE mg/dL
Leukocytes,Ua: NEGATIVE
Nitrite: NEGATIVE
Protein, ur: 30 mg/dL — AB
Specific Gravity, Urine: 1.016 (ref 1.005–1.030)
Squamous Epithelial / HPF: NONE SEEN (ref 0–5)
pH: 6 (ref 5.0–8.0)

## 2020-03-31 LAB — HEPATIC FUNCTION PANEL
ALT: 338 U/L — ABNORMAL HIGH (ref 0–44)
AST: 257 U/L — ABNORMAL HIGH (ref 15–41)
Albumin: 4 g/dL (ref 3.5–5.0)
Alkaline Phosphatase: 179 U/L — ABNORMAL HIGH (ref 38–126)
Bilirubin, Direct: 0.3 mg/dL — ABNORMAL HIGH (ref 0.0–0.2)
Indirect Bilirubin: 0.7 mg/dL (ref 0.3–0.9)
Total Bilirubin: 1 mg/dL (ref 0.3–1.2)
Total Protein: 7.4 g/dL (ref 6.5–8.1)

## 2020-03-31 LAB — LIPASE, BLOOD: Lipase: 62 U/L — ABNORMAL HIGH (ref 11–51)

## 2020-03-31 LAB — SARS CORONAVIRUS 2 BY RT PCR (HOSPITAL ORDER, PERFORMED IN ~~LOC~~ HOSPITAL LAB): SARS Coronavirus 2: NEGATIVE

## 2020-03-31 LAB — PHOSPHORUS: Phosphorus: 1.5 mg/dL — ABNORMAL LOW (ref 2.5–4.6)

## 2020-03-31 LAB — MAGNESIUM: Magnesium: 1.8 mg/dL (ref 1.7–2.4)

## 2020-03-31 MED ORDER — IOHEXOL 300 MG/ML  SOLN
100.0000 mL | Freq: Once | INTRAMUSCULAR | Status: AC | PRN
  Administered 2020-03-31: 100 mL via INTRAVENOUS

## 2020-03-31 MED ORDER — FENTANYL CITRATE (PF) 100 MCG/2ML IJ SOLN
50.0000 ug | Freq: Once | INTRAMUSCULAR | Status: AC
  Administered 2020-03-31: 50 ug via INTRAVENOUS
  Filled 2020-03-31: qty 2

## 2020-03-31 MED ORDER — POTASSIUM CHLORIDE IN NACL 40-0.9 MEQ/L-% IV SOLN
INTRAVENOUS | Status: DC
  Administered 2020-04-01: 125 mL/h via INTRAVENOUS
  Filled 2020-03-31 (×8): qty 1000

## 2020-03-31 MED ORDER — SERTRALINE HCL 50 MG PO TABS
100.0000 mg | ORAL_TABLET | Freq: Every day | ORAL | Status: DC
  Administered 2020-04-01 – 2020-04-02 (×2): 100 mg via ORAL
  Filled 2020-03-31 (×2): qty 2

## 2020-03-31 MED ORDER — GABAPENTIN 300 MG PO CAPS
300.0000 mg | ORAL_CAPSULE | Freq: Four times a day (QID) | ORAL | Status: DC
  Administered 2020-03-31 – 2020-04-03 (×10): 300 mg via ORAL
  Filled 2020-03-31 (×4): qty 1
  Filled 2020-03-31: qty 3
  Filled 2020-03-31 (×4): qty 1

## 2020-03-31 MED ORDER — ENOXAPARIN SODIUM 40 MG/0.4ML ~~LOC~~ SOLN
40.0000 mg | SUBCUTANEOUS | Status: DC
  Administered 2020-04-01 – 2020-04-02 (×2): 40 mg via SUBCUTANEOUS
  Filled 2020-03-31 (×2): qty 0.4

## 2020-03-31 MED ORDER — ONDANSETRON HCL 4 MG PO TABS: 4.0000 mg | ORAL_TABLET | Freq: Four times a day (QID) | ORAL | Status: DC | PRN

## 2020-03-31 MED ORDER — THIAMINE HCL 100 MG/ML IJ SOLN: 100.0000 mg | Freq: Every day | INTRAMUSCULAR | Status: DC

## 2020-03-31 MED ORDER — ONDANSETRON HCL 4 MG/2ML IJ SOLN
4.0000 mg | Freq: Once | INTRAMUSCULAR | Status: AC
  Administered 2020-03-31: 4 mg via INTRAVENOUS
  Filled 2020-03-31: qty 2

## 2020-03-31 MED ORDER — LORAZEPAM 2 MG/ML IJ SOLN: 1.0000 mg | INTRAMUSCULAR | Status: DC | PRN

## 2020-03-31 MED ORDER — SODIUM CHLORIDE 0.9 % IV SOLN: Freq: Once | INTRAVENOUS | Status: DC

## 2020-03-31 MED ORDER — MORPHINE SULFATE (PF) 2 MG/ML IV SOLN
INTRAVENOUS | Status: AC
  Filled 2020-03-31: qty 1

## 2020-03-31 MED ORDER — PANTOPRAZOLE SODIUM 40 MG IV SOLR
40.0000 mg | Freq: Two times a day (BID) | INTRAVENOUS | Status: DC
  Administered 2020-03-31 – 2020-04-02 (×4): 40 mg via INTRAVENOUS
  Filled 2020-03-31 (×4): qty 40

## 2020-03-31 MED ORDER — LORAZEPAM 1 MG PO TABS
1.0000 mg | ORAL_TABLET | ORAL | Status: DC | PRN
  Administered 2020-04-01: 2 mg via ORAL
  Administered 2020-04-02: 1 mg via ORAL
  Filled 2020-03-31: qty 2
  Filled 2020-03-31: qty 1

## 2020-03-31 MED ORDER — AMLODIPINE BESYLATE 10 MG PO TABS
10.0000 mg | ORAL_TABLET | Freq: Every day | ORAL | Status: DC
  Administered 2020-03-31 – 2020-04-03 (×4): 10 mg via ORAL
  Filled 2020-03-31: qty 1
  Filled 2020-03-31: qty 2
  Filled 2020-03-31 (×2): qty 1

## 2020-03-31 MED ORDER — MORPHINE SULFATE (PF) 4 MG/ML IV SOLN
6.0000 mg | Freq: Once | INTRAVENOUS | Status: AC
  Administered 2020-03-31: 6 mg via INTRAVENOUS
  Filled 2020-03-31: qty 2

## 2020-03-31 MED ORDER — FOLIC ACID 1 MG PO TABS
1.0000 mg | ORAL_TABLET | Freq: Every day | ORAL | Status: DC
  Administered 2020-04-01 – 2020-04-03 (×3): 1 mg via ORAL
  Filled 2020-03-31 (×3): qty 1

## 2020-03-31 MED ORDER — ONDANSETRON HCL 4 MG/2ML IJ SOLN: 4.0000 mg | Freq: Four times a day (QID) | INTRAMUSCULAR | Status: DC | PRN

## 2020-03-31 MED ORDER — SODIUM CHLORIDE 0.9 % IV BOLUS
1000.0000 mL | Freq: Once | INTRAVENOUS | Status: AC
  Administered 2020-03-31: 1000 mL via INTRAVENOUS

## 2020-03-31 MED ORDER — MORPHINE SULFATE (PF) 2 MG/ML IV SOLN
2.0000 mg | INTRAVENOUS | Status: DC | PRN
  Administered 2020-03-31 – 2020-04-02 (×7): 2 mg via INTRAVENOUS
  Filled 2020-03-31 (×7): qty 1

## 2020-03-31 MED ORDER — ADULT MULTIVITAMIN W/MINERALS CH
1.0000 | ORAL_TABLET | Freq: Every day | ORAL | Status: DC
  Administered 2020-04-01 – 2020-04-03 (×3): 1 via ORAL
  Filled 2020-03-31 (×3): qty 1

## 2020-03-31 MED ORDER — THIAMINE HCL 100 MG PO TABS
100.0000 mg | ORAL_TABLET | Freq: Every day | ORAL | Status: DC
  Administered 2020-04-01 – 2020-04-03 (×3): 100 mg via ORAL
  Filled 2020-03-31 (×3): qty 1

## 2020-03-31 MED ORDER — SODIUM CHLORIDE 0.9 % IV SOLN: INTRAVENOUS | Status: DC

## 2020-03-31 MED ORDER — MIRTAZAPINE 15 MG PO TABS
15.0000 mg | ORAL_TABLET | Freq: Every day | ORAL | Status: DC
  Administered 2020-03-31 – 2020-04-02 (×3): 15 mg via ORAL
  Filled 2020-03-31 (×3): qty 1

## 2020-03-31 NOTE — ED Notes (Signed)
Verbal order given by Dr. Fuller Plan for fentanyl IV stat and 4mg  zofran IV stat

## 2020-03-31 NOTE — ED Notes (Signed)
Transport to floor room 134.AS 

## 2020-03-31 NOTE — ED Notes (Signed)
Pharmacy in with pt. Pt given a ginger ale and a cup of ice.

## 2020-03-31 NOTE — ED Triage Notes (Signed)
Pt arrives via POV from home with c/o abdominal pain, nausea, vomiting and diarrhea x 3 days. Pt states he was seen here yesterday and received fluids and states he felt better after but once he got home he started vomiting again and states his stomach was swollen accompanied by sharp pains in the RLQ. PT appears to be in NAD at this time, ambulatory from triage, skin warm and dry

## 2020-03-31 NOTE — Progress Notes (Signed)
Report called to [facility] for patient being transferred to room 134. Reyes Ivan, NP sent a message for pain medication request. No other issues noted at this time.

## 2020-03-31 NOTE — H&P (Signed)
History and Physical    Mario Beck ZDG:[id number] DOB: 1981/04/16 DOA: 03/31/2020  PCP: Patient, No Pcp Per   Patient coming from: Home   Chief Complaint: Abdominal pain with nausea and vomiting  HPI: Mario Beck is a 39 y.o. male with medical history significant of alcoholic pancreatitis, duodenitis, ongoing alcohol use with alcohol withdrawal symptoms, hypertension, anxiety, depression, asthma, peripheral neuropathy presented to the ED with persistent abdominal pain with nausea and vomiting.  Symptoms ongoing for past 4 days with frequent nausea and vomiting, abdominal pain and loose bowel movements.  Reports ongoing alcohol use due to personal stressors.  Last drink was half pint of vodka yesterday morning.  He came to the ED yesterday afternoon for similar symptoms, with alcohol level of 180, markedly elevated LFTs given IV pain meds and IV fluids and discharged home.  Ultrasound abdomen done without acute findings. He returned today with persistent symptoms with episode of bilious vomiting and now with epigastric pain radiating to the back.  Denies any fevers, chills, headache, blurred vision, dizziness, syncope, palpitation, shortness of breath, chest pain, dysuria, hematemesis or melena, focal muscle weakness.  Patient was hospitalized 1 month back for acute alcoholic pancreatitis with duodenitis and alcohol withdrawal.    ED Course: Patient was tachycardic to 116, blood pressure 134/96 mmHg, afebrile with normal respiratory rate and normal O2 sat on room air. Blood work done showed normal WBC, hemoglobin and platelets, chemistry showed potassium of 3.3, glucose 227, anion gap of 16, lipase of 62, significant transaminitis with AST of 257, ALT of 338 and alk phos of 179 with normal bilirubin (improved from yesterday with ALT of 450, AST of 459 and alk phos of 185). A CT of the abdomen pelvis showed acute pancreatitis with phlegmon centered in the uncinate process without fluid collection,  pseudocyst or abscess.  Trace free fluid in the right upper quadrant with inflammatory change in the mesentery likely secondary to acute pancreatitis.  Bilateral abnormal gland nodularity likely from adenomas.  Patient given 4 mg IV morphine, IV Zofran 4 mg x 2 and 1 L normal saline bolus.  Hospitalist consulted for admission to medical floor on telemetry for acute alcoholic pancreatitis.  Covid 19 PCR ordered (patient reports he has not been vaccinated yet)   Review of Systems: As per HPI otherwise all other systems reviewed and are negative.   Past Medical History:  Diagnosis Date  . Alcohol abuse   . Asthma   . Depression   . Hypertension     Past Surgical History:  Procedure Laterality Date  . FRACTURE SURGERY     Left leg as a kid    Social History  reports that he has been smoking cigarettes. He has been smoking about 1.00 pack per day. His smokeless tobacco use includes snuff. He reports current alcohol use. He reports that he does not use drugs.  Allergies  Allergen Reactions  . Lisinopril Swelling    Angioedema   . Shellfish Allergy     Family History  Problem Relation Age of Onset  . Colon cancer Neg Hx   . Esophageal cancer Neg Hx     Prior to Admission medications   Medication Sig Start Date End Date Taking? Authorizing Provider  acetaminophen (TYLENOL) 500 MG tablet Take 500 mg by mouth every 6 (six) hours as needed.    [provider]  amLODipine (NORVASC) 10 MG tablet Take 1 tablet (10 mg total) by mouth daily. Patient taking differently: Take 20 mg by mouth  daily.  11/26/19   Khatri, Hina, PA-C  chlordiazePOXIDE (LIBRIUM) 25 MG capsule Day 1: take 50 mg (2 capsules) in AM, afternoon, and evening. Day 2: take 50 mg (2 capsules) AM and PM. Day 3: take 50 mg (2 capsules) in AM 03/30/20   Lilia Pro., MD  folic acid (FOLVITE) 1 MG tablet Take 1 tablet (1 mg total) by mouth daily. 02/24/20   Nicole Kindred A, DO  gabapentin (NEURONTIN) 300 MG capsule  Take 1 capsule (300 mg total) by mouth 4 (four) times daily. 02/23/20   Nicole Kindred A, DO  mirtazapine (REMERON) 15 MG tablet Take 1 tablet (15 mg total) by mouth at bedtime. 02/23/20   Nicole Kindred A, DO  ondansetron (ZOFRAN) 4 MG tablet Take 1 tablet (4 mg total) by mouth every 6 (six) hours as needed for nausea or vomiting. 02/23/20   Ezekiel Slocumb, DO  pantoprazole (PROTONIX) 40 MG tablet Take 1 tablet (40 mg total) by mouth 2 (two) times daily. 02/23/20 05/23/20  Nicole Kindred A, DO  sertraline (ZOLOFT) 100 MG tablet Take 1 tablet (100 mg total) by mouth daily in the afternoon. 02/23/20   Nicole Kindred A, DO  thiamine 100 MG tablet Take 1 tablet (100 mg total) by mouth daily. 02/24/20   Ezekiel Slocumb, DO    Physical Exam: Vitals:   03/31/20 1251  BP: (!) 136/94  Pulse: (!) 116  Resp: 18  Temp: 99.4 F (37.4 C)  TempSrc: Oral  SpO2: 98%  Weight: 86.2 kg  Height: '6\' 3"'  (1.905 m)    Constitutional: NAD, calm, comfortable Vitals:   03/31/20 1251  BP: (!) 136/94  Pulse: (!) 116  Resp: 18  Temp: 99.4 F (37.4 C)  TempSrc: Oral  SpO2: 98%  Weight: 86.2 kg  Height: '6\' 3"'  (1.905 m)   Eyes: Pupils reactive bilaterally, normal extraocular movement ENMT: No pallor, no icterus, dry oral mucosa, supple neck, no cervical lymphadenopathy Respiratory: clear to auscultation bilaterally, no added sounds Cardiovascular: S1-S2 tachycardic, no murmurs rub or gallop Abdomen: Soft, nondistended, epigastric tenderness to pressure, bowel sounds present Musculoskeletal: Warm, no edema, normal skin CNS: Alert and oriented, nonfocal, no tremors    Labs on Admission: I have personally reviewed following labs and imaging studies  CBC: Recent Labs  Lab 03/30/20 1425 03/31/20 1259  WBC 6.6 6.8  NEUTROABS  --  5.1  HGB 15.2 15.2  HCT 40.5 43.1  MCV 75.6* 77.9*  PLT 278 536    Basic Metabolic Panel: Recent Labs  Lab 03/30/20 1425 03/31/20 1259  NA 136 135  K 3.7 3.3*  CL 98  97*  CO2 23 22  GLUCOSE 111* 227*  BUN 12 6  CREATININE 0.86 0.88  CALCIUM 9.1 9.2    GFR: Estimated Creatinine Clearance: 137.4 mL/min (by C-G formula based on SCr of 0.88 mg/dL).  Liver Function Tests: Recent Labs  Lab 03/30/20 1425 03/31/20 1259  AST 459* 257*  ALT 450* 338*  ALKPHOS 185* 179*  BILITOT 1.1 1.0  PROT 7.3 7.4  ALBUMIN 4.0 4.0    Urine analysis:    Component Value Date/Time   COLORURINE YELLOW (A) 03/31/2020 1258   APPEARANCEUR HAZY (A) 03/31/2020 1258   LABSPEC 1.016 03/31/2020 1258   PHURINE 6.0 03/31/2020 1258   GLUCOSEU NEGATIVE 03/31/2020 1258   HGBUR SMALL (A) 03/31/2020 1258   BILIRUBINUR NEGATIVE 03/31/2020 1258   [place] 03/31/2020 1258   PROTEINUR 30 (A) 03/31/2020 1258   NITRITE NEGATIVE  03/31/2020 1258   [place] 03/31/2020 1258    Radiological Exams on Admission: CT ABDOMEN PELVIS W CONTRAST  Result Date: 03/31/2020 CLINICAL DATA:  Abdominal pain, nausea vomiting, diarrhea for 3 days EXAM: CT ABDOMEN AND PELVIS WITH CONTRAST TECHNIQUE: Multidetector CT imaging of the abdomen and pelvis was performed using the standard protocol following bolus administration of intravenous contrast. CONTRAST:  113m OMNIPAQUE IOHEXOL 300 MG/ML  SOLN COMPARISON:  03/30/2020, 02/17/2020 FINDINGS: Lower chest: No acute pleural or parenchymal lung disease. Hepatobiliary: Stable diffuse hepatic steatosis. No focal liver abnormality. Moderate gallbladder distention without cholelithiasis or cholecystitis. Pancreas: Inflammatory changes within the uncinate process of the pancreas are again noted, now measuring 2.9 x 3.7 cm. There is no evidence of fluid collection, abscess, or pseudocyst. Remainder of the pancreatic parenchyma is unremarkable. Spleen: Normal in size without focal abnormality. Adrenals/Urinary Tract: Stable subcentimeter hypodensity left kidney likely reflecting cysts. Otherwise the kidneys are unremarkable. Bladder is grossly  normal. Nonspecific nodularity of the adrenal glands is stable, statistically likely representing adenomas. Stomach/Bowel: No bowel obstruction or ileus. Wide-mouth diverticulum incidentally noted at the ventral aspect of the third portion duodenum. No bowel wall thickening or inflammatory change. Normal appendix right lower quadrant. Vascular/Lymphatic: No significant atherosclerosis. Portal, splenic, and superior mesenteric veins are patent. No pathologic adenopathy. Reproductive: Prostate is unremarkable. Other: There is mesenteric edema and trace free fluid surrounding the pancreatic phlegmon described above. No fluid collection. No free gas. No abdominal wall hernia. Musculoskeletal: There are no acute or destructive bony lesions. Reconstructed images demonstrate no additional findings. IMPRESSION: 1. Acute pancreatitis, with phlegmon centered in the uncinate process as above. No fluid collection, pseudocyst, or abscess. Adjacent vascular structures are widely patent. 2. Trace free fluid right upper quadrant, with inflammatory changes in the mesentery, secondary to the acute pancreatitis described above. 3. Stable nodularity of the adrenal glands bilaterally, statistically likely reflecting adenomas. Electronically Signed   By: MRanda NgoM.D.   On: 03/31/2020 15:36   [place] LIMITED RUQ  Result Date: 03/30/2020 CLINICAL DATA:  Elevated LFTs.  Abdominal pain with nausea vomiting. EXAM: ULTRASOUND ABDOMEN LIMITED RIGHT UPPER QUADRANT COMPARISON:  Abdomen CT 02/17/2020 FINDINGS: Gallbladder: Gallbladder is distended without wall thickening or gallstones. No pericholecystic fluid. Sonographer reports no sonographic Murphy sign. Common bile duct: Diameter: 3 mm Liver: No focal lesion identified. Within normal limits in parenchymal echogenicity. Portal vein is patent on color Doppler imaging with normal direction of blood flow towards the liver. Other: None. IMPRESSION: No acute findings on right upper  quadrant ultrasound. Electronically Signed   By: EMisty StanleyM.D.   On: 03/30/2020 16:37    EKG: Pending  Assessment/Plan Principal Problem:   Acute alcoholic pancreatitis Admit to MedSurg on telemetry.  Has ongoing alcohol use.  Multiple ED visits and hospitalization for alcoholic pancreatitis (third admission in the past 5 weeks). Currently has moderate pain.  Will place on IV morphine 2 mg every 3 hours as needed for pain.  Zofran as needed for nausea and vomiting.  Keep n.p.o. overnight.  Aggressive IV hydration with normal saline at 125 cc/h with potassium supplement.  Serial abdominal exam.  Check lipase in a.m.  CT abdomen showed acute pancreatitis with pancreatic phlegmon without pseudocyst or abscess. Counseled strongly on alcohol cessation.  Active Problems: Acute transaminitis Likely alcoholic hepatitis.  Transaminases improving from yesterday.  Check INR to calculate mattery discriminant function and need for steroid use.  Liver ultrasound without acute findings.  Monitor LFTs closely.  Hypokalemia Replenished  with IV fluids.  Check magnesium.    Alcohol use disorder, moderate, dependence (HCC) History of alcohol withdrawal symptoms and high risk for going to alcohol withdrawal and DTs.  Monitor on CIWA.  No signs of withdrawal is present.  Continue thiamine, folate and multivitamin.  History of alcoholic duodenitis IV PPI twice daily  Polyneuropathy Likely associated with alcohol use.  Continue gabapentin.   Anxiety and depression Continue home meds  Uncontrolled hypertension Elevated blood pressure likely secondary to underlying symptoms.  Resume amlodipine.     DVT prophylaxis: Subcu Lovenox Code Status:   Full code Family Communication:  Girlfriend at bedside Disposition Plan:   Patient is from:  Home  Anticipated DC to:  Home  Anticipated DC date:  6/18  Anticipated DC barriers: Acute symptoms   Consults called:  None Admission status:   Inpatient  Severity of Illness: The appropriate patient status for this patient is INPATIENT. Inpatient status is judged to be reasonable and necessary in order to provide the required intensity of service to ensure the patient's safety. The patient's presenting symptoms, physical exam findings, and initial radiographic and laboratory data in the context of their chronic comorbidities is felt to place them at high risk for further clinical deterioration. Furthermore, it is not anticipated that the patient will be medically stable for discharge from the hospital within 2 midnights of admission. The following factors support the patient status of inpatient.   " The patient's presenting symptoms include acute pancreatitis. " The worrisome physical exam findings include abdominal pain, acute pancreatitis symptoms high risk for going to DTs " The initial radiographic and laboratory data are worrisome because of transaminitis, elevated lipase " The chronic co-morbidities include alcoholic pancreatitis   * I certify that at the point of admission it is my clinical judgment that the patient will require inpatient hospital care spanning beyond 2 midnights from the point of admission due to high intensity of service, high risk for further deterioration and high frequency of surveillance required.*   Total time spent: 60 minutes  Daichi Moris MD [facility]  How to contact the [facility] Attending or Consulting provider [place] or covering provider during after hours [place], for this patient?   1. Check the care team in [facility] and look for a) attending/consulting [facility] provider listed and b) the [facility] team listed 2. Log into [email or web address] and use [place]'s universal password to access. If you do not have the password, please contact the hospital operator. 3. Locate the [facility] provider you are looking for under [facility] and page to a number that you can be directly reached. 4. If you still have  difficulty reaching the provider, please page the [facility] (Director on Call) for the Hospitalists listed on amion for assistance.  03/31/2020, 5:55 PM

## 2020-03-31 NOTE — ED Provider Notes (Signed)
[facility] Emergency Department Provider Note  ____________________________________________   First MD Initiated Contact with Patient 03/31/20 1620     (approximate)  I have reviewed the triage vital signs and the nursing notes.   HISTORY  Chief Complaint Abdominal Pain    HPI Mario Beck is a 39 y.o. male with past medical history of hypertension, alcohol he abuse with prior pancreatitis, here with abdominal pain.  The patient states that over the last 2 days, he has had aching, gnawing, severe, epigastric pain.  Was seen yesterday and was diagnosed with likely alcohol-related abdominal pain.  He had normal lipase and right upper quadrant ultrasound at that time.  However, since then, he has had ongoing, worsening nausea, vomiting, and inability to eat or drink.  The pain is 10 out of 10, epigastric and radiates to his back.  Is worse with any attempted eating.  No leaving factors.  No fevers or chills.        Past Medical History:  Diagnosis Date  . Alcohol abuse   . Asthma   . Depression   . Hypertension     Patient Active Problem List   Diagnosis Date Noted  . Nausea and vomiting 02/21/2020  . Intractable nausea and vomiting 02/21/2020  . Tobacco abuse counseling   . Acute alcoholic pancreatitis 54/62/7035  . HTN (hypertension) 02/18/2020  . Alcohol use disorder, moderate, dependence ([place]) 02/18/2020  . Acute pancreatitis 02/18/2020    Past Surgical History:  Procedure Laterality Date  . FRACTURE SURGERY     Left leg as a kid    Prior to Admission medications   Medication Sig Start Date End Date Taking? Authorizing Provider  acetaminophen (TYLENOL) 500 MG tablet Take 500 mg by mouth every 6 (six) hours as needed.    [provider]  amLODipine (NORVASC) 10 MG tablet Take 1 tablet (10 mg total) by mouth daily. Patient taking differently: Take 20 mg by mouth daily.  11/26/19   Khatri, Hina, PA-C  chlordiazePOXIDE (LIBRIUM) 25 MG  capsule Day 1: take 50 mg (2 capsules) in AM, afternoon, and evening. Day 2: take 50 mg (2 capsules) AM and PM. Day 3: take 50 mg (2 capsules) in AM 03/30/20   Lilia Pro., MD  folic acid (FOLVITE) 1 MG tablet Take 1 tablet (1 mg total) by mouth daily. 02/24/20   Nicole Kindred A, DO  gabapentin (NEURONTIN) 300 MG capsule Take 1 capsule (300 mg total) by mouth 4 (four) times daily. 02/23/20   Nicole Kindred A, DO  mirtazapine (REMERON) 15 MG tablet Take 1 tablet (15 mg total) by mouth at bedtime. 02/23/20   Nicole Kindred A, DO  ondansetron (ZOFRAN) 4 MG tablet Take 1 tablet (4 mg total) by mouth every 6 (six) hours as needed for nausea or vomiting. 02/23/20   Ezekiel Slocumb, DO  pantoprazole (PROTONIX) 40 MG tablet Take 1 tablet (40 mg total) by mouth 2 (two) times daily. 02/23/20 05/23/20  Nicole Kindred A, DO  sertraline (ZOLOFT) 100 MG tablet Take 1 tablet (100 mg total) by mouth daily in the afternoon. 02/23/20   Nicole Kindred A, DO  thiamine 100 MG tablet Take 1 tablet (100 mg total) by mouth daily. 02/24/20   Ezekiel Slocumb, DO    Allergies Lisinopril and Shellfish allergy  Family History  Problem Relation Age of Onset  . Colon cancer Neg Hx   . Esophageal cancer Neg Hx     Social History Social History  Tobacco Use  . Smoking status: Current Every Day Smoker    Packs/day: 1.00    Types: Cigarettes  . Smokeless tobacco: Current User    Types: Snuff  . Tobacco comment: Does pouches as welll   Vaping Use  . Vaping Use: Never used  Substance Use Topics  . Alcohol use: Yes    Comment: half a pint  . Drug use: No    Review of Systems  Review of Systems  Constitutional: Negative for chills, fatigue and fever.  HENT: Negative for sore throat.   Respiratory: Negative for shortness of breath.   Cardiovascular: Negative for chest pain.  Gastrointestinal: Positive for abdominal pain, nausea and vomiting.  Genitourinary: Negative for flank pain.  Musculoskeletal: Negative  for neck pain.  Skin: Negative for rash and wound.  Allergic/Immunologic: Negative for immunocompromised state.  Neurological: Positive for weakness. Negative for numbness.  Hematological: Does not bruise/bleed easily.  All other systems reviewed and are negative.    ____________________________________________  PHYSICAL EXAM:      VITAL SIGNS: ED Triage Vitals [03/31/20 1251]  Enc Vitals Group     BP (!) 136/94     Pulse Rate (!) 116     Resp 18     Temp 99.4 F (37.4 C)     Temp Source Oral     SpO2 98 %     Weight 190 lb (86.2 kg)     Height 6\' 3"  (1.905 m)     Head Circumference      Peak Flow      Pain Score 7     Pain Loc      Pain Edu?      Excl. in GC?      Physical Exam Vitals and nursing note reviewed.  Constitutional:      General: He is not in acute distress.    Appearance: He is well-developed.  HENT:     Head: Normocephalic and atraumatic.     Comments: Dry mucous membranes Eyes:     Conjunctiva/sclera: Conjunctivae normal.  Cardiovascular:     Rate and Rhythm: Normal rate and regular rhythm.     Heart sounds: Normal heart sounds. No murmur heard.  No friction rub.  Pulmonary:     Effort: Pulmonary effort is normal. No respiratory distress.     Breath sounds: Normal breath sounds. No wheezing or rales.  Abdominal:     General: There is no distension.     Palpations: Abdomen is soft.     Tenderness: There is abdominal tenderness in the epigastric area. There is no guarding or rebound.  Musculoskeletal:     Cervical back: Neck supple.  Skin:    General: Skin is warm.     Capillary Refill: Capillary refill takes less than 2 seconds.  Neurological:     Mental Status: He is alert and oriented to person, place, and time.     Motor: No abnormal muscle tone.       ____________________________________________   LABS (all labs ordered are listed, but only abnormal results are displayed)  Labs Reviewed  BASIC METABOLIC PANEL - Abnormal;  Notable for the following components:      Result Value   Potassium 3.3 (*)    Chloride 97 (*)    Glucose, Bld 227 (*)    Anion gap 16 (*)    All other components within normal limits  HEPATIC FUNCTION PANEL - Abnormal; Notable for the following components:   AST 257 (*)  ALT 338 (*)    Alkaline Phosphatase 179 (*)    Bilirubin, Direct 0.3 (*)    All other components within normal limits  LIPASE, BLOOD - Abnormal; Notable for the following components:   Lipase 62 (*)    All other components within normal limits  CBC WITH DIFFERENTIAL/PLATELET - Abnormal; Notable for the following components:   MCV 77.9 (*)    All other components within normal limits  URINALYSIS, COMPLETE (UACMP) WITH MICROSCOPIC - Abnormal; Notable for the following components:   Color, Urine YELLOW (*)    APPearance HAZY (*)    Hgb urine dipstick SMALL (*)    Protein, ur 30 (*)    All other components within normal limits  ETHANOL    ____________________________________________  EKG:  ________________________________________  RADIOLOGY All imaging, including plain films, CT scans, and ultrasounds, independently reviewed by me, and interpretations confirmed via formal radiology reads.  ED MD interpretation:   CT abdomen/pelvis: Acute pancreatitis with phlegmon, no fluid collection  Official radiology report(s): CT ABDOMEN PELVIS W CONTRAST  Result Date: 03/31/2020 CLINICAL DATA:  Abdominal pain, nausea vomiting, diarrhea for 3 days EXAM: CT ABDOMEN AND PELVIS WITH CONTRAST TECHNIQUE: Multidetector CT imaging of the abdomen and pelvis was performed using the standard protocol following bolus administration of intravenous contrast. CONTRAST:  OMNIPAQUE IOHEXOL 300 MG/ML  SOLN COMPARISON:  03/30/2020, 02/17/2020 FINDINGS: Lower chest: No acute pleural or parenchymal lung disease. Hepatobiliary: Stable diffuse hepatic steatosis. No focal liver abnormality. Moderate gallbladder distention without  cholelithiasis or cholecystitis. Pancreas: Inflammatory changes within the uncinate process of the pancreas are again noted, now measuring 2.9 x 3.7 cm. There is no evidence of fluid collection, abscess, or pseudocyst. Remainder of the pancreatic parenchyma is unremarkable. Spleen: Normal in size without focal abnormality. Adrenals/Urinary Tract: Stable subcentimeter hypodensity left kidney likely reflecting cysts. Otherwise the kidneys are unremarkable. Bladder is grossly normal. Nonspecific nodularity of the adrenal glands is stable, statistically likely representing adenomas. Stomach/Bowel: No bowel obstruction or ileus. Wide-mouth diverticulum incidentally noted at the ventral aspect of the third portion duodenum. No bowel wall thickening or inflammatory change. Normal appendix right lower quadrant. Vascular/Lymphatic: No significant atherosclerosis. Portal, splenic, and superior mesenteric veins are patent. No pathologic adenopathy. Reproductive: Prostate is unremarkable. Other: There is mesenteric edema and trace free fluid surrounding the pancreatic phlegmon described above. No fluid collection. No free gas. No abdominal wall hernia. Musculoskeletal: There are no acute or destructive bony lesions. Reconstructed images demonstrate no additional findings. IMPRESSION: 1. Acute pancreatitis, with phlegmon centered in the uncinate process as above. No fluid collection, pseudocyst, or abscess. Adjacent vascular structures are widely patent. 2. Trace free fluid right upper quadrant, with inflammatory changes in the mesentery, secondary to the acute pancreatitis described above. 3. Stable nodularity of the adrenal glands bilaterally, statistically likely reflecting adenomas. Electronically Signed   By: Sharlet Salina M.D.   On: 03/31/2020 15:36    ____________________________________________  PROCEDURES   Procedure(s) performed (including Critical  Care):  Procedures  ____________________________________________  INITIAL IMPRESSION / MDM / ASSESSMENT AND PLAN / ED COURSE  As part of my medical decision making, I reviewed the following data within the electronic MEDICAL RECORD NUMBER Nursing notes reviewed and incorporated, Old chart reviewed, Notes from prior ED visits, and [place] Controlled Substance Database       *Mario Beck was evaluated in Emergency Department on 03/31/2020 for the symptoms described in the history of present illness. He was evaluated in the context of the global COVID-19  pandemic, which necessitated consideration that the patient might be at risk for infection with the SARS-CoV-2 virus that causes COVID-19. Institutional protocols and algorithms that pertain to the evaluation of patients at risk for COVID-19 are in a state of rapid change based on information released by regulatory bodies including the CDC and federal and state organizations. These policies and algorithms were followed during the patient's care in the ED.  Some ED evaluations and interventions may be delayed as a result of limited staffing during the pandemic.*     Medical Decision Making: 39 year old male here with ongoing abdominal pain, nausea, and vomiting likely due to alcoholic pancreatitis.  His lipase is only slightly elevated but suspect this is due to the recurrent nature of his pancreatitis.  Has had intractable nausea and vomiting despite outpatient treatment.  No evidence of pseudocyst, abscess, or other complication.  He is hemodynamically stable.  Will admit for pain control and fluids.  ____________________________________________  FINAL CLINICAL IMPRESSION(S) / ED DIAGNOSES  Final diagnoses:  Alcohol-induced acute pancreatitis without infection or necrosis     MEDICATIONS GIVEN DURING THIS VISIT:  Medications  0.9 %  sodium chloride infusion (has no administration in time range)  morphine 2 MG/ML injection (  Not Given 03/31/20  1715)  fentaNYL (SUBLIMAZE) injection 50 mcg (50 mcg Intravenous Given 03/31/20 1451)  ondansetron (ZOFRAN) injection 4 mg (4 mg Intravenous Given 03/31/20 1452)  iohexol (OMNIPAQUE) 300 MG/ML solution 100 mL (100 mLs Intravenous Contrast Given 03/31/20 1519)  sodium chloride 0.9 % bolus 1,000 mL (1,000 mLs Intravenous New Bag/Given 03/31/20 1705)  morphine 4 MG/ML injection 6 mg (6 mg Intravenous Given 03/31/20 1707)  ondansetron (ZOFRAN) injection 4 mg (4 mg Intravenous Given 03/31/20 1706)     ED Discharge Orders    None       Note:  This document was prepared using Dragon voice recognition software and may include unintentional dictation errors.   Shaune Pollack, MD 03/31/20 1719

## 2020-04-01 DIAGNOSIS — I10 Essential (primary) hypertension: Secondary | ICD-10-CM

## 2020-04-01 LAB — COMPREHENSIVE METABOLIC PANEL
ALT: 217 U/L — ABNORMAL HIGH (ref 0–44)
AST: 116 U/L — ABNORMAL HIGH (ref 15–41)
Albumin: 3.3 g/dL — ABNORMAL LOW (ref 3.5–5.0)
Alkaline Phosphatase: 142 U/L — ABNORMAL HIGH (ref 38–126)
Anion gap: 7 (ref 5–15)
BUN: <5 mg/dL — ABNORMAL LOW (ref 6–20)
CO2: 26 mmol/L (ref 22–32)
Calcium: 8.6 mg/dL — ABNORMAL LOW (ref 8.9–10.3)
Chloride: 107 mmol/L (ref 98–111)
Creatinine, Ser: 1.05 mg/dL (ref 0.61–1.24)
GFR calc Af Amer: >60 mL/min (ref >60)
GFR calc non Af Amer: >60 mL/min (ref >60)
Glucose, Bld: 127 mg/dL — ABNORMAL HIGH (ref 70–99)
Potassium: 3.8 mmol/L (ref 3.5–5.1)
Sodium: 140 mmol/L (ref 135–145)
Total Bilirubin: 0.8 mg/dL (ref 0.3–1.2)
Total Protein: 6.1 g/dL — ABNORMAL LOW (ref 6.5–8.1)

## 2020-04-01 LAB — CBC
HCT: 36.7 % — ABNORMAL LOW (ref 39.0–52.0)
Hemoglobin: 13.2 g/dL (ref 13.0–17.0)
MCH: 27.8 pg (ref 26.0–34.0)
MCHC: 36 g/dL (ref 30.0–36.0)
MCV: 77.4 fL — ABNORMAL LOW (ref 80.0–100.0)
Platelets: 197 10*3/uL (ref 150–400)
RBC: 4.74 MIL/uL (ref 4.22–5.81)
RDW: 13.9 % (ref 11.5–15.5)
WBC: 6.1 10*3/uL (ref 4.0–10.5)
nRBC: 0 % (ref 0.0–0.2)

## 2020-04-01 LAB — ETHANOL: Alcohol, Ethyl (B): <10 mg/dL (ref <10)

## 2020-04-01 LAB — LIPASE, BLOOD: Lipase: 58 U/L — ABNORMAL HIGH (ref 11–51)

## 2020-04-01 MED ORDER — K PHOS MONO-SOD PHOS DI & MONO 155-852-130 MG PO TABS
500.0000 mg | ORAL_TABLET | ORAL | Status: AC
  Administered 2020-04-01 – 2020-04-02 (×4): 500 mg via ORAL
  Filled 2020-04-01 (×4): qty 2

## 2020-04-01 MED ORDER — MAGNESIUM HYDROXIDE 400 MG/5ML PO SUSP: 30.0000 mL | Freq: Every evening | ORAL | Status: DC | PRN

## 2020-04-01 NOTE — Progress Notes (Signed)
Pt had gotten oob and hr went  Up   140s.  Per  Central tele.  Pt was back in bed when I went in and  His  Hr had settled  Back down to 105.

## 2020-04-01 NOTE — Progress Notes (Signed)
Pt  Cont to c/o abd pain  7/10.  Morphine given.  Pt asking for  Something to eat.

## 2020-04-01 NOTE — Progress Notes (Signed)
PROGRESS NOTE    Mario Beck  YQM:[id number] DOB: [birth year] DOA: 03/31/2020 PCP: Patient, No Pcp Per   Assessment & Plan:   Principal Problem:   Acute alcoholic pancreatitis Active Problems:   HTN (hypertension)   Alcohol use disorder, moderate, dependence ([facility])   Nausea and vomiting   Transaminitis   Acute transaminitis: likely alcoholic hepatitis.  Transaminases are still elevated but trending down today. Liver ultrasound without acute findings.    Hypokalemia: WNL today. Will continue to monitor   Alcohol use disorder: moderate dependence. History of alcohol withdrawal symptoms and high risk for going to alcohol withdrawal and DTs.  Monitor on CIWA. Continue thiamine, folate and multivitamin. 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  Continue gabapentin.  Depression: severity unknown. Continue on home dose of sertraline   Uncontrolled hypertension: continue on amlodipine.     DVT prophylaxis: lovenox Code Status: full  Family Communication: Disposition Plan: likely d/c back home. Unlikely any barriers  Status is: Inpatient  Remains inpatient appropriate because:Ongoing active pain requiring inpatient pain management   Dispo: The patient is from: Home              Anticipated d/c is to: Home              Anticipated d/c date is: 1 day              Patient currently is not medically stable to d/c.     Consultants:      Procedures:    Antimicrobials:    Subjective: Pt c/o abd pain   Objective: Vitals:   03/31/20 1251 03/31/20 1949 03/31/20 2205 04/01/20 0031  BP: (!) 136/94 (!) 160/108 (!) 143/106 (!) 144/105  Pulse: (!) 116 92 (!) 102 98  Resp: 18  20 20   Temp: 99.4 F (37.4 C) 98.6 F (37 C) 98 F (36.7 C) (!) 97.4 F (36.3 C)  TempSrc: Oral Oral Oral Oral  SpO2: 98% 100% 99% 98%  Weight: 86.2 kg     Height: 6\' 3"  (1.905 m)        Intake/Output Summary (Last 24 hours) at 04/01/2020 0714 Last data filed at 04/01/2020 0400 Gross per 24 hour  Intake 649.87 ml  Output --  Net 649.87 ml   Filed Weights   03/31/20 1251  Weight: 86.2 kg    Examination:  General exam: Appears calm and comfortable  Respiratory system: Clear to auscultation. No rales, rhonchi  Cardiovascular system: S1 & S2 +. No rubs, gallops or clicks.  Gastrointestinal system: Abdomen is nondistended, soft and tenderness to palpation. Normal bowel sounds heard. Central nervous system: Alert and oriented. Moves all 4 extremities Psychiatry: Judgement and insight appear normal. Flat mood and affect    Data Reviewed: I have personally reviewed following labs and imaging studies  CBC: Recent Labs  Lab 03/30/20 1425 03/31/20 1259 04/01/20 0507  WBC 6.6 6.8 6.1  NEUTROABS  --  5.1  --   HGB 15.2 15.2 13.2  HCT 40.5 43.1 36.7*  MCV 75.6* 77.9* 77.4*  PLT 278 253 528   Basic Metabolic Panel: Recent Labs  Lab 03/30/20 1425 03/31/20 1259 04/01/20 0507  NA 136 135 140  K 3.7 3.3* 3.8  CL 98 97* 107  CO2 23 22 26   GLUCOSE 111* 227* 127*  BUN 12 6 <5*  CREATININE 0.86 0.88 1.05  CALCIUM 9.1 9.2 8.6*  MG  --  1.8  --  PHOS  --  1.5*  --    GFR: Estimated Creatinine Clearance: 115.1 mL/min (by C-G formula based on SCr of 1.05 mg/dL). Liver Function Tests: Recent Labs  Lab 03/30/20 1425 03/31/20 1259 04/01/20 0507  AST 459* 257* 116*  ALT 450* 338* 217*  ALKPHOS 185* 179* 142*  BILITOT 1.1 1.0 0.8  PROT 7.3 7.4 6.1*  ALBUMIN 4.0 4.0 3.3*   Recent Labs  Lab 03/30/20 1425 03/31/20 1259 04/01/20 0507  LIPASE 21 62* 58*   No results for input(s): AMMONIA in the last 168 hours. Coagulation Profile: No results for input(s): INR, PROTIME in the last 168 hours. Cardiac Enzymes: No results for input(s): CKTOTAL, CKMB, CKMBINDEX, TROPONINI in the last 168 hours. BNP (last 3 results) No results for input(s): PROBNP in the  last 8760 hours. HbA1C: No results for input(s): HGBA1C in the last 72 hours. CBG: No results for input(s): GLUCAP in the last 168 hours. Lipid Profile: No results for input(s): CHOL, HDL, LDLCALC, TRIG, CHOLHDL, LDLDIRECT in the last 72 hours. Thyroid Function Tests: No results for input(s): TSH, T4TOTAL, FREET4, T3FREE, THYROIDAB in the last 72 hours. Anemia Panel: No results for input(s): VITAMINB12, FOLATE, FERRITIN, TIBC, IRON, RETICCTPCT in the last 72 hours. Sepsis Labs: No results for input(s): PROCALCITON, LATICACIDVEN in the last 168 hours.  Recent Results (from the past 240 hour(s))  SARS Coronavirus 2 by RT PCR (hospital order, performed in [facility] hospital lab) Nasopharyngeal Nasopharyngeal Swab     Status: None   Collection Time: 03/31/20  5:59 PM   Specimen: Nasopharyngeal Swab  Result Value Ref Range Status   SARS Coronavirus 2 NEGATIVE NEGATIVE Final    Comment: (NOTE) SARS-CoV-2 target nucleic acids are NOT DETECTED.  The SARS-CoV-2 RNA is generally detectable in upper and lower respiratory specimens during the acute phase of infection. The lowest concentration of SARS-CoV-2 viral copies this assay can detect is 250 copies / mL. A negative result does not preclude SARS-CoV-2 infection and should not be used as the sole basis for treatment or other patient management decisions.  A negative result may occur with improper specimen collection / handling, submission of specimen other than nasopharyngeal swab, presence of viral mutation(s) within the areas targeted by this assay, and inadequate number of viral copies (<250 copies / mL). A negative result must be combined with clinical observations, patient history, and epidemiological information.  Fact Sheet for Patients:   BoilerBrush.com.cy  Fact Sheet for Healthcare Providers: https://pope.com/  This test is not yet approved or  cleared by the [place]  FDA and has been authorized for detection and/or diagnosis of SARS-CoV-2 by FDA under an Emergency Use Authorization (EUA).  This EUA will remain in effect (meaning this test can be used) for the duration of the COVID-19 declaration under Section 564(b)(1) of the Act, 21 U.S.C. section 360bbb-3(b)(1), unless the authorization is terminated or revoked sooner.  Performed at [facility], [address] [place], [place] [address]          Radiology Studies: CT ABDOMEN PELVIS W CONTRAST  Result Date: 03/31/2020 CLINICAL DATA:  Abdominal pain, nausea vomiting, diarrhea for 3 days EXAM: CT ABDOMEN AND PELVIS WITH CONTRAST TECHNIQUE: Multidetector CT imaging of the abdomen and pelvis was performed using the standard protocol following bolus administration of intravenous contrast. CONTRAST:  OMNIPAQUE IOHEXOL 300 MG/ML  SOLN COMPARISON:  03/30/2020, 02/17/2020 FINDINGS: Lower chest: No acute pleural or parenchymal lung disease. Hepatobiliary: Stable diffuse hepatic steatosis. No focal liver  abnormality. Moderate gallbladder distention without cholelithiasis or cholecystitis. Pancreas: Inflammatory changes within the uncinate process of the pancreas are again noted, now measuring 2.9 x 3.7 cm. There is no evidence of fluid collection, abscess, or pseudocyst. Remainder of the pancreatic parenchyma is unremarkable. Spleen: Normal in size without focal abnormality. Adrenals/Urinary Tract: Stable subcentimeter hypodensity left kidney likely reflecting cysts. Otherwise the kidneys are unremarkable. Bladder is grossly normal. Nonspecific nodularity of the adrenal glands is stable, statistically likely representing adenomas. Stomach/Bowel: No bowel obstruction or ileus. Wide-mouth diverticulum incidentally noted at the ventral aspect of the third portion duodenum. No bowel wall thickening or inflammatory change. Normal appendix right lower quadrant. Vascular/Lymphatic: No significant  atherosclerosis. Portal, splenic, and superior mesenteric veins are patent. No pathologic adenopathy. Reproductive: Prostate is unremarkable. Other: There is mesenteric edema and trace free fluid surrounding the pancreatic phlegmon described above. No fluid collection. No free gas. No abdominal wall hernia. Musculoskeletal: There are no acute or destructive bony lesions. Reconstructed images demonstrate no additional findings. IMPRESSION: 1. Acute pancreatitis, with phlegmon centered in the uncinate process as above. No fluid collection, pseudocyst, or abscess. Adjacent vascular structures are widely patent. 2. Trace free fluid right upper quadrant, with inflammatory changes in the mesentery, secondary to the acute pancreatitis described above. 3. Stable nodularity of the adrenal glands bilaterally, statistically likely reflecting adenomas. Electronically Signed   By: Sharlet Salina M.D.   On: 03/31/2020 15:36   US ABDOMEN LIMITED RUQ  Result Date: 03/30/2020 CLINICAL DATA:  Elevated LFTs.  Abdominal pain with nausea vomiting. EXAM: ULTRASOUND ABDOMEN LIMITED RIGHT UPPER QUADRANT COMPARISON:  Abdomen CT 02/17/2020 FINDINGS: Gallbladder: Gallbladder is distended without wall thickening or gallstones. No pericholecystic fluid. Sonographer reports no sonographic Murphy sign. Common bile duct: Diameter: 3 mm Liver: No focal lesion identified. Within normal limits in parenchymal echogenicity. Portal vein is patent on color Doppler imaging with normal direction of blood flow towards the liver. Other: None. IMPRESSION: No acute findings on right upper quadrant ultrasound. Electronically Signed   By: Kennith Center M.D.   On: 03/30/2020 16:37        Scheduled Meds: . amLODipine  10 mg Oral Daily  . enoxaparin (LOVENOX) injection  40 mg Subcutaneous Q24H  . folic acid  1 mg Oral Daily  . gabapentin  300 mg Oral QID  . mirtazapine  15 mg Oral QHS  . multivitamin with minerals  1 tablet Oral Daily  .  pantoprazole (PROTONIX) IV  40 mg Intravenous Q12H  . sertraline  100 mg Oral Q1500  . thiamine  100 mg Oral Daily   Or  . thiamine  100 mg Intravenous Daily   Continuous Infusions: . 0.9 % NaCl with KCl 40 mEq / L 125 mL/hr at 04/01/20 0400     LOS: 1 day    Time spent: 31 mins     Charise Killian, MD [facility] Pager [phone]  If 7PM-7AM, please contact night-coverage [email or web address] 04/01/2020, 7:14 AM

## 2020-04-01 NOTE — Plan of Care (Signed)
  Problem: Education: Goal: Knowledge of General Education information will improve Description: Including pain rating scale, medication(s)/side effects and non-pharmacologic comfort measures Outcome: Progressing   Problem: Clinical Measurements: Goal: Respiratory complications will improve Outcome: Progressing Goal: Cardiovascular complication will be avoided Outcome: Progressing   Problem: Coping: Goal: Level of anxiety will decrease Outcome: Progressing   

## 2020-04-02 LAB — CBC
HCT: 35.6 % — ABNORMAL LOW (ref 39.0–52.0)
Hemoglobin: 12.6 g/dL — ABNORMAL LOW (ref 13.0–17.0)
MCH: 27.9 pg (ref 26.0–34.0)
MCHC: 35.4 g/dL (ref 30.0–36.0)
MCV: 78.9 fL — ABNORMAL LOW (ref 80.0–100.0)
Platelets: 169 10*3/uL (ref 150–400)
RBC: 4.51 MIL/uL (ref 4.22–5.81)
RDW: 14.6 % (ref 11.5–15.5)
WBC: 4.6 10*3/uL (ref 4.0–10.5)
nRBC: 0 % (ref 0.0–0.2)

## 2020-04-02 LAB — BASIC METABOLIC PANEL
Anion gap: 11 (ref 5–15)
BUN: 5 mg/dL — ABNORMAL LOW (ref 6–20)
CO2: 25 mmol/L (ref 22–32)
Calcium: 8.6 mg/dL — ABNORMAL LOW (ref 8.9–10.3)
Chloride: 107 mmol/L (ref 98–111)
Creatinine, Ser: 0.96 mg/dL (ref 0.61–1.24)
GFR calc Af Amer: >60 mL/min (ref >60)
GFR calc non Af Amer: >60 mL/min (ref >60)
Glucose, Bld: 121 mg/dL — ABNORMAL HIGH (ref 70–99)
Potassium: 4 mmol/L (ref 3.5–5.1)
Sodium: 143 mmol/L (ref 135–145)

## 2020-04-02 LAB — PHOSPHORUS: Phosphorus: 4.5 mg/dL (ref 2.5–4.6)

## 2020-04-02 MED ORDER — BISACODYL 5 MG PO TBEC
10.0000 mg | DELAYED_RELEASE_TABLET | Freq: Once | ORAL | Status: AC
  Administered 2020-04-02: 10 mg via ORAL
  Filled 2020-04-02: qty 2

## 2020-04-02 MED ORDER — TRAMADOL HCL 50 MG PO TABS
50.0000 mg | ORAL_TABLET | Freq: Four times a day (QID) | ORAL | Status: DC | PRN
  Administered 2020-04-03: 50 mg via ORAL
  Filled 2020-04-02: qty 1

## 2020-04-02 MED ORDER — PANTOPRAZOLE SODIUM 40 MG PO TBEC
40.0000 mg | DELAYED_RELEASE_TABLET | Freq: Two times a day (BID) | ORAL | Status: DC
  Administered 2020-04-02 – 2020-04-03 (×2): 40 mg via ORAL
  Filled 2020-04-02: qty 1

## 2020-04-02 NOTE — TOC Transition Note (Signed)
Transition of Care [facility]) - CM/SW Discharge Note   Patient Details  Name: Jasraj Lappe MRN: [id number] Date of Birth: 1981-06-13  Transition of Care [facility]) CM/SW Contact:  Elease Hashimoto, LCSW Phone Number: 04/02/2020, 11:18 AM   Clinical Narrative:   Met with pt due to high risk screen has been in the hospital multiple times in the past month between [facility] and [facility] for the same condition. He voiced when he gets stressed he drinks he has all resources for substance abuse services and may follow up with. Will give medical clinic information to pt to follow up with due to lives in [place]. Pt is moving better today and feels better. He reports his girlfriend is supportive along with his Mom. No needs at this time, possible discharge today or tomorrow.   10:15 AM Gave him information for [facility] and [facility]. He is aware to stop by [facility] to pick up his medications. He is waiting for bedside RN to give him paperwork and has his car in the parking lot here. He wanted no other needs-substance abuse resources.    Barriers to Discharge: Continued Medical Work up   Patient Goals and CMS Choice Patient states their goals for this hospitalization and ongoing recovery are:: I am starting to feel better today and moving better      Discharge Placement                       Discharge Plan and Services In-house Referral: Clinical Social Work Discharge Planning Services: [facility], Medication Assistance Post Acute Care Choice: NA                               Social Determinants of Health (SDOH) Interventions     Readmission Risk Interventions No flowsheet data found.

## 2020-04-02 NOTE — Progress Notes (Signed)
PROGRESS NOTE    Mario Beck  YQM:[id number] DOB: February 05, 1981 DOA: 03/31/2020 PCP: Patient, No Pcp Per   Assessment & Plan:   Principal Problem:   Acute alcoholic pancreatitis Active Problems:   HTN (hypertension)   Alcohol use disorder, moderate, dependence (HCC)   Nausea and vomiting   Transaminitis   Acute transaminitis: improving. Likely alcoholic hepatitis.  Transaminases are still elevated but trending down each day . Liver ultrasound without acute findings.    Hypokalemia: WNL today. Will continue to monitor   Alcohol use disorder: moderate dependence. History of alcohol withdrawal symptoms and high risk for going to alcohol withdrawal and DTs.  Monitor on CIWA. Continue thiamine, folate and multivitamin. 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  Continue gabapentin.  Depression: severity unknown. Continue on home dose of sertraline   Uncontrolled hypertension: continue on amlodipine.     DVT prophylaxis: lovenox Code Status: full  Family Communication: Disposition Plan: likely d/c back home. Unlikely any barriers  Status is: Inpatient  Remains inpatient appropriate because:Ongoing active pain requiring inpatient pain management   Dispo: The patient is from: Home              Anticipated d/c is to: Home              Anticipated d/c date is: 1 day              Patient currently is not medically stable to d/c.     Consultants:      Procedures:    Antimicrobials:    Subjective: Pt c/o abd pain still slightly improved from day prior.   Objective: Vitals:   04/01/20 1800 04/01/20 1923 04/01/20 2214 04/01/20 2354  BP:  (!) 141/97 (!) 139/97 (!) 136/92  Pulse: (!) 105 100 99 91  Resp:  17  17  Temp:  98 F (36.7 C) 98.2 F (36.8 C) 98.1 F (36.7 C)  TempSrc:   Oral   SpO2:  100% 100% 100%  Weight:      Height:        Intake/Output Summary  (Last 24 hours) at 04/02/2020 5284 Last data filed at 04/02/2020 0636 Gross per 24 hour  Intake 1200 ml  Output 3350 ml  Net -2150 ml   Filed Weights   03/31/20 1251  Weight: 86.2 kg    Examination:  General exam: Appears calm and comfortable  Respiratory system: Clear to auscultation. No wheezes Cardiovascular system: S1 & S2 +. No rubs, gallops or clicks.  Gastrointestinal system: Abdomen is nondistended, soft and tenderness to palpation. Hypoactive bowel sounds heard. Central nervous system: Alert and oriented. Moves all 4 extremities Psychiatry: Judgement and insight appear normal. Flat mood and affect    Data Reviewed: I have personally reviewed following labs and imaging studies  CBC: Recent Labs  Lab 03/30/20 1425 03/31/20 1259 04/01/20 0507 04/02/20 0611  WBC 6.6 6.8 6.1 4.6  NEUTROABS  --  5.1  --   --   HGB 15.2 15.2 13.2 12.6*  HCT 40.5 43.1 36.7* 35.6*  MCV 75.6* 77.9* 77.4* 78.9*  PLT 278 253 197 132   Basic Metabolic Panel: Recent Labs  Lab 03/30/20 1425 03/31/20 1259 04/01/20 0507  NA 136 135 140  K 3.7 3.3* 3.8  CL 98 97* 107  CO2 23 22 26   GLUCOSE 111* 227* 127*  BUN 12 6 <5*  CREATININE 0.86 0.88 1.05  CALCIUM 9.1  9.2 8.6*  MG  --  1.8  --   PHOS  --  1.5*  --    GFR: Estimated Creatinine Clearance: 115.1 mL/min (by C-G formula based on SCr of 1.05 mg/dL). Liver Function Tests: Recent Labs  Lab 03/30/20 1425 03/31/20 1259 04/01/20 0507  AST 459* 257* 116*  ALT 450* 338* 217*  ALKPHOS 185* 179* 142*  BILITOT 1.1 1.0 0.8  PROT 7.3 7.4 6.1*  ALBUMIN 4.0 4.0 3.3*   Recent Labs  Lab 03/30/20 1425 03/31/20 1259 04/01/20 0507  LIPASE 21 62* 58*   No results for input(s): AMMONIA in the last 168 hours. Coagulation Profile: No results for input(s): INR, PROTIME in the last 168 hours. Cardiac Enzymes: No results for input(s): CKTOTAL, CKMB, CKMBINDEX, TROPONINI in the last 168 hours. BNP (last 3 results) No results for  input(s): PROBNP in the last 8760 hours. HbA1C: No results for input(s): HGBA1C in the last 72 hours. CBG: No results for input(s): GLUCAP in the last 168 hours. Lipid Profile: No results for input(s): CHOL, HDL, LDLCALC, TRIG, CHOLHDL, LDLDIRECT in the last 72 hours. Thyroid Function Tests: No results for input(s): TSH, T4TOTAL, FREET4, T3FREE, THYROIDAB in the last 72 hours. Anemia Panel: No results for input(s): VITAMINB12, FOLATE, FERRITIN, TIBC, IRON, RETICCTPCT in the last 72 hours. Sepsis Labs: No results for input(s): PROCALCITON, LATICACIDVEN in the last 168 hours.  Recent Results (from the past 240 hour(s))  SARS Coronavirus 2 by RT PCR (hospital order, performed in [facility] hospital lab) Nasopharyngeal Nasopharyngeal Swab     Status: None   Collection Time: 03/31/20  5:59 PM   Specimen: Nasopharyngeal Swab  Result Value Ref Range Status   SARS Coronavirus 2 NEGATIVE NEGATIVE Final    Comment: (NOTE) SARS-CoV-2 target nucleic acids are NOT DETECTED.  The SARS-CoV-2 RNA is generally detectable in upper and lower respiratory specimens during the acute phase of infection. The lowest concentration of SARS-CoV-2 viral copies this assay can detect is 250 copies / mL. A negative result does not preclude SARS-CoV-2 infection and should not be used as the sole basis for treatment or other patient management decisions.  A negative result may occur with improper specimen collection / handling, submission of specimen other than nasopharyngeal swab, presence of viral mutation(s) within the areas targeted by this assay, and inadequate number of viral copies (<250 copies / mL). A negative result must be combined with clinical observations, patient history, and epidemiological information.  Fact Sheet for Patients:   BoilerBrush.com.cy  Fact Sheet for Healthcare Providers: https://pope.com/  This test is not yet approved or  cleared  by the [place] FDA and has been authorized for detection and/or diagnosis of SARS-CoV-2 by FDA under an Emergency Use Authorization (EUA).  This EUA will remain in effect (meaning this test can be used) for the duration of the COVID-19 declaration under Section 564(b)(1) of the Act, 21 U.S.C. section 360bbb-3(b)(1), unless the authorization is terminated or revoked sooner.  Performed at [facility], [address] [place], [place] [address]          Radiology Studies: CT ABDOMEN PELVIS W CONTRAST  Result Date: 03/31/2020 CLINICAL DATA:  Abdominal pain, nausea vomiting, diarrhea for 3 days EXAM: CT ABDOMEN AND PELVIS WITH CONTRAST TECHNIQUE: Multidetector CT imaging of the abdomen and pelvis was performed using the standard protocol following bolus administration of intravenous contrast. CONTRAST:  OMNIPAQUE IOHEXOL 300 MG/ML  SOLN COMPARISON:  03/30/2020, 02/17/2020 FINDINGS: Lower chest: No acute pleural  or parenchymal lung disease. Hepatobiliary: Stable diffuse hepatic steatosis. No focal liver abnormality. Moderate gallbladder distention without cholelithiasis or cholecystitis. Pancreas: Inflammatory changes within the uncinate process of the pancreas are again noted, now measuring 2.9 x 3.7 cm. There is no evidence of fluid collection, abscess, or pseudocyst. Remainder of the pancreatic parenchyma is unremarkable. Spleen: Normal in size without focal abnormality. Adrenals/Urinary Tract: Stable subcentimeter hypodensity left kidney likely reflecting cysts. Otherwise the kidneys are unremarkable. Bladder is grossly normal. Nonspecific nodularity of the adrenal glands is stable, statistically likely representing adenomas. Stomach/Bowel: No bowel obstruction or ileus. Wide-mouth diverticulum incidentally noted at the ventral aspect of the third portion duodenum. No bowel wall thickening or inflammatory change. Normal appendix right lower quadrant. Vascular/Lymphatic: No  significant atherosclerosis. Portal, splenic, and superior mesenteric veins are patent. No pathologic adenopathy. Reproductive: Prostate is unremarkable. Other: There is mesenteric edema and trace free fluid surrounding the pancreatic phlegmon described above. No fluid collection. No free gas. No abdominal wall hernia. Musculoskeletal: There are no acute or destructive bony lesions. Reconstructed images demonstrate no additional findings. IMPRESSION: 1. Acute pancreatitis, with phlegmon centered in the uncinate process as above. No fluid collection, pseudocyst, or abscess. Adjacent vascular structures are widely patent. 2. Trace free fluid right upper quadrant, with inflammatory changes in the mesentery, secondary to the acute pancreatitis described above. 3. Stable nodularity of the adrenal glands bilaterally, statistically likely reflecting adenomas. Electronically Signed   By: Sharlet Salina M.D.   On: 03/31/2020 15:36        Scheduled Meds: . amLODipine  10 mg Oral Daily  . enoxaparin (LOVENOX) injection  40 mg Subcutaneous Q24H  . folic acid  1 mg Oral Daily  . gabapentin  300 mg Oral QID  . mirtazapine  15 mg Oral QHS  . multivitamin with minerals  1 tablet Oral Daily  . pantoprazole (PROTONIX) IV  40 mg Intravenous Q12H  . sertraline  100 mg Oral Q1500  . thiamine  100 mg Oral Daily   Or  . thiamine  100 mg Intravenous Daily   Continuous Infusions: . 0.9 % NaCl with KCl 40 mEq / L 125 mL/hr at 04/02/20 0140     LOS: 2 days    Time spent: 30 mins     Charise Killian, MD [facility] Pager [phone]  If 7PM-7AM, please contact night-coverage [email or web address] 04/02/2020, 7:12 AM

## 2020-04-03 LAB — COMPREHENSIVE METABOLIC PANEL
ALT: 131 U/L — ABNORMAL HIGH (ref 0–44)
AST: 70 U/L — ABNORMAL HIGH (ref 15–41)
Albumin: 3.4 g/dL — ABNORMAL LOW (ref 3.5–5.0)
Alkaline Phosphatase: 131 U/L — ABNORMAL HIGH (ref 38–126)
Anion gap: 12 (ref 5–15)
BUN: 6 mg/dL (ref 6–20)
CO2: 24 mmol/L (ref 22–32)
Calcium: 8.6 mg/dL — ABNORMAL LOW (ref 8.9–10.3)
Chloride: 101 mmol/L (ref 98–111)
Creatinine, Ser: 0.92 mg/dL (ref 0.61–1.24)
GFR calc Af Amer: >60 mL/min (ref >60)
GFR calc non Af Amer: >60 mL/min (ref >60)
Glucose, Bld: 189 mg/dL — ABNORMAL HIGH (ref 70–99)
Potassium: 3.4 mmol/L — ABNORMAL LOW (ref 3.5–5.1)
Sodium: 137 mmol/L (ref 135–145)
Total Bilirubin: 0.7 mg/dL (ref 0.3–1.2)
Total Protein: 6.7 g/dL (ref 6.5–8.1)

## 2020-04-03 LAB — CBC
HCT: 36 % — ABNORMAL LOW (ref 39.0–52.0)
Hemoglobin: 12.7 g/dL — ABNORMAL LOW (ref 13.0–17.0)
MCH: 27.9 pg (ref 26.0–34.0)
MCHC: 35.3 g/dL (ref 30.0–36.0)
MCV: 79.1 fL — ABNORMAL LOW (ref 80.0–100.0)
Platelets: 154 10*3/uL (ref 150–400)
RBC: 4.55 MIL/uL (ref 4.22–5.81)
RDW: 14.5 % (ref 11.5–15.5)
WBC: 6.3 10*3/uL (ref 4.0–10.5)
nRBC: 0 % (ref 0.0–0.2)

## 2020-04-03 MED ORDER — CHLORDIAZEPOXIDE HCL 25 MG PO CAPS: 25.0000 mg | ORAL_CAPSULE | Freq: Two times a day (BID) | ORAL | 0 refills | Status: AC | PRN

## 2020-04-03 MED ORDER — PANTOPRAZOLE SODIUM 40 MG PO TBEC
40.0000 mg | DELAYED_RELEASE_TABLET | Freq: Two times a day (BID) | ORAL | 0 refills | Status: DC
Start: 2020-04-03 — End: 2020-09-16

## 2020-04-03 MED ORDER — AMLODIPINE BESYLATE 10 MG PO TABS
10.0000 mg | ORAL_TABLET | Freq: Every day | ORAL | 0 refills | Status: DC
Start: 2020-04-03 — End: 2021-05-28

## 2020-04-03 MED ORDER — GABAPENTIN 300 MG PO CAPS: 300.0000 mg | ORAL_CAPSULE | Freq: Four times a day (QID) | ORAL | 0 refills | Status: DC

## 2020-04-03 MED ORDER — SERTRALINE HCL 100 MG PO TABS: 100.0000 mg | ORAL_TABLET | Freq: Every day | ORAL | 0 refills | Status: DC

## 2020-04-03 MED ORDER — POTASSIUM CHLORIDE CRYS ER 20 MEQ PO TBCR
20.0000 meq | EXTENDED_RELEASE_TABLET | Freq: Once | ORAL | Status: AC
  Administered 2020-04-03: 20 meq via ORAL
  Filled 2020-04-03: qty 1

## 2020-04-03 NOTE — Progress Notes (Signed)
Pt discharged at this time to home. Pt has his car in parking lot. A/o no distress or c/o. Instructions discussed with pt.  meds diet activity and f/u discussed. Verbalizes understanding.  Sl d/cd .  Pt ambulated out  Per preference

## 2020-04-03 NOTE — Plan of Care (Signed)

## 2020-04-03 NOTE — Progress Notes (Signed)
   04/02/20 1950  Assess: MEWS Score  Temp 98.3 F (36.8 C)  BP (!) 142/102  Pulse Rate (!) 128  Resp 20  SpO2 99 %  O2 Device Room Air  Assess: MEWS Score  MEWS Temp 0  MEWS Systolic 0  MEWS Pulse 2  MEWS RR 0  MEWS LOC 0  MEWS Score 2  MEWS Score Color Yellow  Assess: if the MEWS score is Yellow or Red  Were vital signs taken at a resting state? Yes  Focused Assessment Documented focused assessment  Early Detection of Sepsis Score *See Row Information* Low  MEWS guidelines implemented *See Row Information* No, other (Comment) (CIWA patient)  Treat  MEWS Interventions Administered prn meds/treatments  Notify: Charge Nurse/RN  Name of Charge Nurse/RN Notified Emilia Beck  Date Charge Nurse/RN Notified 04/02/20  Time Charge Nurse/RN Notified 1950  Notify: Provider  Provider Name/Title Manuela Schwartz NP  Date Provider Notified 04/02/20  Time Provider Notified 1955  Notification Type Face-to-face  Notification Reason Other (Comment) (updating NP on HR)  Response No new orders;Other (Comment) (continue to monitor)  Date of Provider Response 04/02/20  Time of Provider Response 1955  Document  Patient Outcome Other (Comment) (continue to monitor)  Progress note created (see row info) Yes

## 2020-04-03 NOTE — Discharge Summary (Signed)
Physician Discharge Summary  Mario Beck HRC:[id number] DOB: Dec 13, 1980 DOA: 03/31/2020  PCP: Patient, No Pcp Per  Admit date: 03/31/2020 Discharge date: 04/03/2020  Admitted From: home Disposition:  home  Recommendations for Outpatient Follow-up:  1. Follow up with PCP in 1, will need a CMP to check LFTs levels  Home Health: no Equipment/Devices:  Discharge Condition: stable CODE STATUS: full  Diet recommendation: Heart Healthy  Brief/Interim Summary: HPI was taken from Dr. Clementeen Graham:  Mario Beck is a 39 y.o. male with medical history significant of alcoholic pancreatitis, duodenitis, ongoing alcohol use with alcohol withdrawal symptoms, hypertension, anxiety, depression, asthma, peripheral neuropathy presented to the ED with persistent abdominal pain with nausea and vomiting.  Symptoms ongoing for past 4 days with frequent nausea and vomiting, abdominal pain and loose bowel movements.  Reports ongoing alcohol use due to personal stressors.  Last drink was half pint of vodka yesterday morning.  He came to the ED yesterday afternoon for similar symptoms, with alcohol level of 180, markedly elevated LFTs given IV pain meds and IV fluids and discharged home.  Ultrasound abdomen done without acute findings. He returned today with persistent symptoms with episode of bilious vomiting and now with epigastric pain radiating to the back.  Denies any fevers, chills, headache, blurred vision, dizziness, syncope, palpitation, shortness of breath, chest pain, dysuria, hematemesis or melena, focal muscle weakness.  Patient was hospitalized 1 month back for acute alcoholic pancreatitis with duodenitis and alcohol withdrawal.    ED Course: Patient was tachycardic to 116, blood pressure 134/96 mmHg, afebrile with normal respiratory rate and normal O2 sat on room air. Blood work done showed normal WBC, hemoglobin and platelets, chemistry showed potassium of 3.3, glucose 227, anion gap of 16, lipase of  62, significant transaminitis with AST of 257, ALT of 338 and alk phos of 179 with normal bilirubin (improved from yesterday with ALT of 450, AST of 459 and alk phos of 185). A CT of the abdomen pelvis showed acute pancreatitis with phlegmon centered in the uncinate process without fluid collection, pseudocyst or abscess.  Trace free fluid in the right upper quadrant with inflammatory change in the mesentery likely secondary to acute pancreatitis.  Bilateral abnormal gland nodularity likely from adenomas.  Patient given 4 mg IV morphine, IV Zofran 4 mg x 2 and 1 L normal saline bolus.  Hospitalist consulted for admission to medical floor on telemetry for acute alcoholic pancreatitis.  Covid 19 PCR ordered (patient reports he has not been vaccinated yet)  Hospital Course from Dr. Lenna Sciara. Jimmye Norman 6/16-6/18/21: Pt was found to have acute transaminitis secondary alcohol abuse. Pt's LFTS were still elevated on the day of d/c but trending down. Pt will need to f/u outpatient w/ PCP to get a CMP to get his LFTs levels checked. Pt verbalized his understanding. Furthermore, pt did receive alcohol cessation counseling while inpatient. For more information, please see previous progress notes  Discharge Diagnoses:  Principal Problem:   Acute alcoholic pancreatitis Active Problems:   HTN (hypertension)   Alcohol use disorder, moderate, dependence (HCC)   Nausea and vomiting   Transaminitis  Acute transaminitis: likely alcoholic hepatitis. Transaminases are still elevated but trending down each day.Liver ultrasound without acute findings.   Hypokalemia: KCl repleted. Will continue to monitor   Alcohol use disorder: moderate dependence. History of alcohol withdrawal symptoms and high risk for going to alcohol withdrawal and DTs. Monitor on CIWA. Continue thiamine, folate and multivitamin. Alcohol cessation counseling   History of alcoholic duodenitis: continue  on PPI   Polyneuropathy: likely associated  with alcohol use. Continue gabapentin.  Depression: severity unknown. Continue on home dose of sertraline   Uncontrolled hypertension: continue on amlodipine.   Discharge Instructions  Discharge Instructions    Diet - low sodium heart healthy   Complete by: As directed    Discharge instructions   Complete by: As directed    F/u PCP in 1 week, will need CMP to check LFTs   Increase activity slowly   Complete by: As directed      Allergies as of 04/03/2020      Reactions   Lisinopril Swelling   Angioedema   Shellfish Allergy       Medication List    TAKE these medications   acetaminophen 500 MG tablet Commonly known as: TYLENOL Take 500 mg by mouth every 6 (six) hours as needed.   amLODipine 10 MG tablet Commonly known as: NORVASC Take 1 tablet (10 mg total) by mouth daily. What changed: how much to take   chlordiazePOXIDE 25 MG capsule Commonly known as: LIBRIUM Take 1 capsule (25 mg total) by mouth 2 (two) times daily as needed for up to 7 days for withdrawal. What changed:   how much to take  how to take this  when to take this  reasons to take this  additional instructions   folic acid 1 MG tablet Commonly known as: FOLVITE Take 1 tablet (1 mg total) by mouth daily.   gabapentin 300 MG capsule Commonly known as: NEURONTIN Take 1 capsule (300 mg total) by mouth 4 (four) times daily.   mirtazapine 15 MG tablet Commonly known as: Remeron Take 1 tablet (15 mg total) by mouth at bedtime.   ondansetron 4 MG tablet Commonly known as: ZOFRAN Take 1 tablet (4 mg total) by mouth every 6 (six) hours as needed for nausea or vomiting.   pantoprazole 40 MG tablet Commonly known as: Protonix Take 1 tablet (40 mg total) by mouth 2 (two) times daily.   sertraline 100 MG tablet Commonly known as: ZOLOFT Take 1 tablet (100 mg total) by mouth daily in the afternoon.   thiamine 100 MG tablet Take 1 tablet (100 mg total) by mouth daily.       Follow-up  Information    [facility] Follow up.   Specialty: Primary Care Why: Make a follow up appointent Contact information: [place] [phone]       Please follow up.   Why: patient needs to make follow up appointment             Allergies  Allergen Reactions  . Lisinopril Swelling    Angioedema   . Shellfish Allergy     Consultations:     Procedures/Studies: CT ABDOMEN PELVIS W CONTRAST  Result Date: 03/31/2020 CLINICAL DATA:  Abdominal pain, nausea vomiting, diarrhea for 3 days EXAM: CT ABDOMEN AND PELVIS WITH CONTRAST TECHNIQUE: Multidetector CT imaging of the abdomen and pelvis was performed using the standard protocol following bolus administration of intravenous contrast. CONTRAST:  141m OMNIPAQUE IOHEXOL 300 MG/ML  SOLN COMPARISON:  03/30/2020, 02/17/2020 FINDINGS: Lower chest: No acute pleural or parenchymal lung disease. Hepatobiliary: Stable diffuse hepatic steatosis. No focal liver abnormality. Moderate gallbladder distention without cholelithiasis or cholecystitis. Pancreas: Inflammatory changes within the uncinate process of the pancreas are again noted, now measuring 2.9 x 3.7 cm. There is no evidence of fluid collection, abscess, or pseudocyst. Remainder of  the pancreatic parenchyma is unremarkable. Spleen: Normal in size without focal abnormality. Adrenals/Urinary Tract: Stable subcentimeter hypodensity left kidney likely reflecting cysts. Otherwise the kidneys are unremarkable. Bladder is grossly normal. Nonspecific nodularity of the adrenal glands is stable, statistically likely representing adenomas. Stomach/Bowel: No bowel obstruction or ileus. Wide-mouth diverticulum incidentally noted at the ventral aspect of the third portion duodenum. No bowel wall thickening or inflammatory change. Normal appendix right lower quadrant. Vascular/Lymphatic: No significant atherosclerosis. Portal,  splenic, and superior mesenteric veins are patent. No pathologic adenopathy. Reproductive: Prostate is unremarkable. Other: There is mesenteric edema and trace free fluid surrounding the pancreatic phlegmon described above. No fluid collection. No free gas. No abdominal wall hernia. Musculoskeletal: There are no acute or destructive bony lesions. Reconstructed images demonstrate no additional findings. IMPRESSION: 1. Acute pancreatitis, with phlegmon centered in the uncinate process as above. No fluid collection, pseudocyst, or abscess. Adjacent vascular structures are widely patent. 2. Trace free fluid right upper quadrant, with inflammatory changes in the mesentery, secondary to the acute pancreatitis described above. 3. Stable nodularity of the adrenal glands bilaterally, statistically likely reflecting adenomas. Electronically Signed   By: Randa Ngo M.D.   On: 03/31/2020 15:36   US ABDOMEN LIMITED RUQ  Result Date: 03/30/2020 CLINICAL DATA:  Elevated LFTs.  Abdominal pain with nausea vomiting. EXAM: ULTRASOUND ABDOMEN LIMITED RIGHT UPPER QUADRANT COMPARISON:  Abdomen CT 02/17/2020 FINDINGS: Gallbladder: Gallbladder is distended without wall thickening or gallstones. No pericholecystic fluid. Sonographer reports no sonographic Murphy sign. Common bile duct: Diameter: 3 mm Liver: No focal lesion identified. Within normal limits in parenchymal echogenicity. Portal vein is patent on color Doppler imaging with normal direction of blood flow towards the liver. Other: None. IMPRESSION: No acute findings on right upper quadrant ultrasound. Electronically Signed   By: Misty Stanley M.D.   On: 03/30/2020 16:37      Subjective: Pt denies any complaints    Discharge Exam: Vitals:   04/03/20 0116 04/03/20 0722  BP: (!) 136/99 (!) 137/101  Pulse: 98 (!) 101  Resp: 20 16  Temp: 98 F (36.7 C) 98 F (36.7 C)  SpO2: 100% 96%   Vitals:   04/02/20 2046 04/02/20 2310 04/03/20 0116 04/03/20 0722  BP:  (!) 134/100 (!) 144/99 (!) 136/99 (!) 137/101  Pulse: (!) 116 (!) 108 98 (!) 101  Resp: _0 Temp: 98.6 F (37 C) 98.2 F (36.8 C) 98 F (36.7 C) 98 F (36.7 C)  TempSrc: Oral Oral Oral Oral  SpO2: 100% 100% 100% 96%  Weight:      Height:        General: Pt is alert, awake, not in acute distress Cardiovascular: S1/S2 +, no rubs, no gallops Respiratory: CTA bilaterally, no wheezing, no rhonchi Abdominal: Soft, NT, ND, bowel sounds + Extremities: no edema, no cyanosis    The results of significant diagnostics from this hospitalization (including imaging, microbiology, ancillary and laboratory) are listed below for reference.     Microbiology: Recent Results (from the past 240 hour(s))  SARS Coronavirus 2 by RT PCR (hospital order, performed in [facility] hospital lab) Nasopharyngeal Nasopharyngeal Swab     Status: None   Collection Time: 03/31/20  5:59 PM   Specimen: Nasopharyngeal Swab  Result Value Ref Range Status   SARS Coronavirus 2 NEGATIVE NEGATIVE Final    Comment: (NOTE) SARS-CoV-2 target nucleic acids are NOT DETECTED.  The SARS-CoV-2 RNA is generally detectable in upper and lower respiratory specimens during the acute phase  of infection. The lowest concentration of SARS-CoV-2 viral copies this assay can detect is 250 copies / mL. A negative result does not preclude SARS-CoV-2 infection and should not be used as the sole basis for treatment or other patient management decisions.  A negative result may occur with improper specimen collection / handling, submission of specimen other than nasopharyngeal swab, presence of viral mutation(s) within the areas targeted by this assay, and inadequate number of viral copies (<250 copies / mL). A negative result must be combined with clinical observations, patient history, and epidemiological information.  Fact Sheet for Patients:   StrictlyIdeas.no  Fact Sheet for Healthcare  Providers: BankingDealers.co.za  This test is not yet approved or  cleared by the [place] FDA and has been authorized for detection and/or diagnosis of SARS-CoV-2 by FDA under an Emergency Use Authorization (EUA).  This EUA will remain in effect (meaning this test can be used) for the duration of the COVID-19 declaration under Section 564(b)(1) of the Act, 21 U.S.C. section 360bbb-3(b)(1), unless the authorization is terminated or revoked sooner.  Performed at [facility], [address] [place], [place] [address]      Labs: BNP (last 3 results) No results for input(s): BNP in the last 8760 hours. Basic Metabolic Panel: Recent Labs  Lab 03/30/20 1425 03/31/20 1259 04/01/20 0507 04/02/20 0611 04/03/20 0448  NA 136 135 140 143 137  K 3.7 3.3* 3.8 4.0 3.4*  CL 98 97* 107 107 101  CO2 _0 GLUCOSE 111* 227* 127* 121* 189*  BUN 12 6 <5* 5* 6  CREATININE 0.86 0.88 1.05 0.96 0.92  CALCIUM 9.1 9.2 8.6* 8.6* 8.6*  MG  --  1.8  --   --   --   PHOS  --  1.5*  --  4.5  --    Liver Function Tests: Recent Labs  Lab 03/30/20 1425 03/31/20 1259 04/01/20 0507 04/03/20 0448  AST 459* 257* 116* 70*  ALT 450* 338* 217* 131*  ALKPHOS 185* 179* 142* 131*  BILITOT 1.1 1.0 0.8 0.7  PROT 7.3 7.4 6.1* 6.7  ALBUMIN 4.0 4.0 3.3* 3.4*   Recent Labs  Lab 03/30/20 1425 03/31/20 1259 04/01/20 0507  LIPASE 21 62* 58*   No results for input(s): AMMONIA in the last 168 hours. CBC: Recent Labs  Lab 03/30/20 1425 03/31/20 1259 04/01/20 0507 04/02/20 0611 04/03/20 0448  WBC 6.6 6.8 6.1 4.6 6.3  NEUTROABS  --  5.1  --   --   --   HGB 15.2 15.2 13.2 12.6* 12.7*  HCT 40.5 43.1 36.7* 35.6* 36.0*  MCV 75.6* 77.9* 77.4* 78.9* 79.1*  PLT 278 253 197 169 154   Cardiac Enzymes: No results for input(s): CKTOTAL, CKMB, CKMBINDEX, TROPONINI in the last 168 hours. BNP: Invalid input(s): POCBNP CBG: No results for input(s): GLUCAP in the  last 168 hours. D-Dimer No results for input(s): DDIMER in the last 72 hours. Hgb A1c No results for input(s): HGBA1C in the last 72 hours. Lipid Profile No results for input(s): CHOL, HDL, LDLCALC, TRIG, CHOLHDL, LDLDIRECT in the last 72 hours. Thyroid function studies No results for input(s): TSH, T4TOTAL, T3FREE, THYROIDAB in the last 72 hours.  Invalid input(s): FREET3 Anemia work up No results for input(s): VITAMINB12, FOLATE, FERRITIN, TIBC, IRON, RETICCTPCT in the last 72 hours. Urinalysis    Component Value Date/Time   COLORURINE YELLOW (A) 03/31/2020 1258   APPEARANCEUR HAZY (A) 03/31/2020 1258   LABSPEC 1.016  03/31/2020 1258   PHURINE 6.0 03/31/2020 1258   [place] 03/31/2020 1258   HGBUR SMALL (A) 03/31/2020 [place] 03/31/2020 [place] 03/31/2020 1258   PROTEINUR 30 (A) 03/31/2020 1258   NITRITE NEGATIVE 03/31/2020 1258   LEUKOCYTESUR NEGATIVE 03/31/2020 1258   Sepsis Labs Invalid input(s): PROCALCITONIN,  WBC,  LACTICIDVEN Microbiology Recent Results (from the past 240 hour(s))  SARS Coronavirus 2 by RT PCR (hospital order, performed in [place] hospital lab) Nasopharyngeal Nasopharyngeal Swab     Status: None   Collection Time: 03/31/20  5:59 PM   Specimen: Nasopharyngeal Swab  Result Value Ref Range Status   SARS Coronavirus 2 NEGATIVE NEGATIVE Final    Comment: (NOTE) SARS-CoV-2 target nucleic acids are NOT DETECTED.  The SARS-CoV-2 RNA is generally detectable in upper and lower respiratory specimens during the acute phase of infection. The lowest concentration of SARS-CoV-2 viral copies this assay can detect is 250 copies / mL. A negative result does not preclude SARS-CoV-2 infection and should not be used as the sole basis for treatment or other patient management decisions.  A negative result may occur with improper specimen collection / handling, submission of specimen other than nasopharyngeal swab,  presence of viral mutation(s) within the areas targeted by this assay, and inadequate number of viral copies (<250 copies / mL). A negative result must be combined with clinical observations, patient history, and epidemiological information.  Fact Sheet for Patients:   StrictlyIdeas.no  Fact Sheet for Healthcare Providers: BankingDealers.co.za  This test is not yet approved or  cleared by the [place] FDA and has been authorized for detection and/or diagnosis of SARS-CoV-2 by FDA under an Emergency Use Authorization (EUA).  This EUA will remain in effect (meaning this test can be used) for the duration of the COVID-19 declaration under Section 564(b)(1) of the Act, 21 U.S.C. section 360bbb-3(b)(1), unless the authorization is terminated or revoked sooner.  Performed at [facility], [address] [place], [place] [address]      Time coordinating discharge: Over 30 minutes  SIGNED:   Wyvonnia Dusky, MD  [facility] 04/03/2020, 12:47 PM Pager   If 7PM-7AM, please contact night-coverage [email or web address]

## 2020-06-16 ENCOUNTER — Telehealth: Payer: Self-pay | Admitting: Pharmacist

## 2020-06-16 NOTE — Telephone Encounter (Signed)
Patient failed to provide requested 2021 financial documentation. No additional medication assistance will be provided by [facility] without the required proof of income documentation. Patient notified by letter Debra Cheek Administrative Assistant [facility] 

## 2020-09-16 ENCOUNTER — Encounter: Payer: Self-pay | Admitting: Emergency Medicine

## 2020-09-16 ENCOUNTER — Inpatient Hospital Stay
Admission: EM | Admit: 2020-09-16 | Discharge: 2020-09-18 | DRG: 439 | Disposition: A | Discharge status: Home / Self Care | Payer: Self-pay | Attending: Internal Medicine | Admitting: Internal Medicine

## 2020-09-16 ENCOUNTER — Other Ambulatory Visit: Payer: Self-pay

## 2020-09-16 ENCOUNTER — Observation Stay
Admit: 2020-09-16 | Discharge: 2020-09-16 | Disposition: A | Discharge status: Home / Self Care | Payer: Self-pay | Attending: Internal Medicine | Admitting: Internal Medicine

## 2020-09-16 DIAGNOSIS — Z720 Tobacco use: Secondary | ICD-10-CM | POA: Diagnosis present

## 2020-09-16 DIAGNOSIS — R195 Other fecal abnormalities: Secondary | ICD-10-CM | POA: Diagnosis present

## 2020-09-16 DIAGNOSIS — Z79899 Other long term (current) drug therapy: Secondary | ICD-10-CM

## 2020-09-16 DIAGNOSIS — I110 Hypertensive heart disease with heart failure: Secondary | ICD-10-CM | POA: Diagnosis present

## 2020-09-16 DIAGNOSIS — J45909 Unspecified asthma, uncomplicated: Secondary | ICD-10-CM | POA: Diagnosis present

## 2020-09-16 DIAGNOSIS — R778 Other specified abnormalities of plasma proteins: Secondary | ICD-10-CM | POA: Diagnosis present

## 2020-09-16 DIAGNOSIS — R9431 Abnormal electrocardiogram [ECG] [EKG]: Secondary | ICD-10-CM

## 2020-09-16 DIAGNOSIS — R7401 Elevation of levels of liver transaminase levels: Secondary | ICD-10-CM

## 2020-09-16 DIAGNOSIS — E869 Volume depletion, unspecified: Secondary | ICD-10-CM | POA: Diagnosis present

## 2020-09-16 DIAGNOSIS — I5022 Chronic systolic (congestive) heart failure: Secondary | ICD-10-CM | POA: Diagnosis present

## 2020-09-16 DIAGNOSIS — K2920 Alcoholic gastritis without bleeding: Secondary | ICD-10-CM | POA: Diagnosis present

## 2020-09-16 DIAGNOSIS — F32A Depression, unspecified: Secondary | ICD-10-CM | POA: Diagnosis present

## 2020-09-16 DIAGNOSIS — I159 Secondary hypertension, unspecified: Secondary | ICD-10-CM

## 2020-09-16 DIAGNOSIS — Z20822 Contact with and (suspected) exposure to covid-19: Secondary | ICD-10-CM | POA: Diagnosis present

## 2020-09-16 DIAGNOSIS — I10 Essential (primary) hypertension: Secondary | ICD-10-CM | POA: Diagnosis present

## 2020-09-16 DIAGNOSIS — I426 Alcoholic cardiomyopathy: Secondary | ICD-10-CM | POA: Diagnosis present

## 2020-09-16 DIAGNOSIS — K219 Gastro-esophageal reflux disease without esophagitis: Secondary | ICD-10-CM | POA: Diagnosis present

## 2020-09-16 DIAGNOSIS — Z91013 Allergy to seafood: Secondary | ICD-10-CM

## 2020-09-16 DIAGNOSIS — K861 Other chronic pancreatitis: Secondary | ICD-10-CM | POA: Diagnosis present

## 2020-09-16 DIAGNOSIS — K859 Acute pancreatitis without necrosis or infection, unspecified: Secondary | ICD-10-CM

## 2020-09-16 DIAGNOSIS — Z888 Allergy status to other drugs, medicaments and biological substances status: Secondary | ICD-10-CM

## 2020-09-16 DIAGNOSIS — K852 Alcohol induced acute pancreatitis without necrosis or infection: Principal | ICD-10-CM

## 2020-09-16 DIAGNOSIS — F1020 Alcohol dependence, uncomplicated: Secondary | ICD-10-CM | POA: Diagnosis present

## 2020-09-16 DIAGNOSIS — F17210 Nicotine dependence, cigarettes, uncomplicated: Secondary | ICD-10-CM | POA: Diagnosis present

## 2020-09-16 LAB — CBC
HCT: 43.5 % (ref 39.0–52.0)
HCT: 40.6 % (ref 39.0–52.0)
HCT: 38.7 % — ABNORMAL LOW (ref 39.0–52.0)
Hemoglobin: 15.7 g/dL (ref 13.0–17.0)
Hemoglobin: 14.5 g/dL (ref 13.0–17.0)
Hemoglobin: 13.9 g/dL (ref 13.0–17.0)
MCH: 28.6 pg (ref 26.0–34.0)
MCH: 28.6 pg (ref 26.0–34.0)
MCH: 28.5 pg (ref 26.0–34.0)
MCHC: 36.1 g/dL — ABNORMAL HIGH (ref 30.0–36.0)
MCHC: 35.7 g/dL (ref 30.0–36.0)
MCHC: 35.9 g/dL (ref 30.0–36.0)
MCV: 79.4 fL — ABNORMAL LOW (ref 80.0–100.0)
MCV: 80.1 fL (ref 80.0–100.0)
MCV: 79.5 fL — ABNORMAL LOW (ref 80.0–100.0)
Platelets: 262 10*3/uL (ref 150–400)
Platelets: 222 10*3/uL (ref 150–400)
Platelets: 205 10*3/uL (ref 150–400)
RBC: 5.48 MIL/uL (ref 4.22–5.81)
RBC: 5.07 MIL/uL (ref 4.22–5.81)
RBC: 4.87 MIL/uL (ref 4.22–5.81)
RDW: 12.2 % (ref 11.5–15.5)
RDW: 12.4 % (ref 11.5–15.5)
RDW: 12.2 % (ref 11.5–15.5)
WBC: 9.1 10*3/uL (ref 4.0–10.5)
WBC: 11.3 10*3/uL — ABNORMAL HIGH (ref 4.0–10.5)
WBC: 10.5 10*3/uL (ref 4.0–10.5)
nRBC: 0 % (ref 0.0–0.2)
nRBC: 0 % (ref 0.0–0.2)
nRBC: 0 % (ref 0.0–0.2)

## 2020-09-16 LAB — URINALYSIS, COMPLETE (UACMP) WITH MICROSCOPIC
Bacteria, UA: NONE SEEN
Bilirubin Urine: NEGATIVE
Glucose, UA: NEGATIVE mg/dL
Ketones, ur: NEGATIVE mg/dL
Leukocytes,Ua: NEGATIVE
Nitrite: NEGATIVE
Protein, ur: 100 mg/dL — AB
Specific Gravity, Urine: 1.024 (ref 1.005–1.030)
Squamous Epithelial / HPF: NONE SEEN (ref 0–5)
pH: 5 (ref 5.0–8.0)

## 2020-09-16 LAB — PROTIME-INR
INR: 0.9 (ref 0.8–1.2)
Prothrombin Time: 12 seconds (ref 11.4–15.2)

## 2020-09-16 LAB — URINE DRUG SCREEN, QUALITATIVE (ARMC ONLY)
Amphetamines, Ur Screen: NOT DETECTED
Barbiturates, Ur Screen: NOT DETECTED
Benzodiazepine, Ur Scrn: NOT DETECTED
Cannabinoid 50 Ng, Ur ~~LOC~~: NOT DETECTED
Cocaine Metabolite,Ur ~~LOC~~: NOT DETECTED
MDMA (Ecstasy)Ur Screen: NOT DETECTED
Methadone Scn, Ur: NOT DETECTED
Opiate, Ur Screen: POSITIVE — AB
Phencyclidine (PCP) Ur S: NOT DETECTED
Tricyclic, Ur Screen: NOT DETECTED

## 2020-09-16 LAB — COMPREHENSIVE METABOLIC PANEL
ALT: 47 U/L — ABNORMAL HIGH (ref 0–44)
AST: 61 U/L — ABNORMAL HIGH (ref 15–41)
Albumin: 4.5 g/dL (ref 3.5–5.0)
Alkaline Phosphatase: 75 U/L (ref 38–126)
Anion gap: 15 (ref 5–15)
BUN: 15 mg/dL (ref 6–20)
CO2: 25 mmol/L (ref 22–32)
Calcium: 10.2 mg/dL (ref 8.9–10.3)
Chloride: 97 mmol/L — ABNORMAL LOW (ref 98–111)
Creatinine, Ser: 1.01 mg/dL (ref 0.61–1.24)
GFR, Estimated: >60 mL/min (ref >60)
Glucose, Bld: 157 mg/dL — ABNORMAL HIGH (ref 70–99)
Potassium: 3.6 mmol/L (ref 3.5–5.1)
Sodium: 137 mmol/L (ref 135–145)
Total Bilirubin: 1.2 mg/dL (ref 0.3–1.2)
Total Protein: 8 g/dL (ref 6.5–8.1)

## 2020-09-16 LAB — TYPE AND SCREEN
ABO/RH(D): O POS
Antibody Screen: NEGATIVE

## 2020-09-16 LAB — RESP PANEL BY RT-PCR (FLU A&B, COVID) ARPGX2
Influenza A by PCR: NEGATIVE
Influenza B by PCR: NEGATIVE
SARS Coronavirus 2 by RT PCR: NEGATIVE

## 2020-09-16 LAB — TROPONIN I (HIGH SENSITIVITY)
Troponin I (High Sensitivity): 25 ng/L — ABNORMAL HIGH (ref <18)
Troponin I (High Sensitivity): 25 ng/L — ABNORMAL HIGH (ref <18)
Troponin I (High Sensitivity): 26 ng/L — ABNORMAL HIGH (ref <18)

## 2020-09-16 LAB — TRIGLYCERIDES: Triglycerides: 188 mg/dL — ABNORMAL HIGH (ref <150)

## 2020-09-16 LAB — APTT: aPTT: 26 seconds (ref 24–36)

## 2020-09-16 LAB — LIPASE, BLOOD: Lipase: 126 U/L — ABNORMAL HIGH (ref 11–51)

## 2020-09-16 MED ORDER — NICOTINE 21 MG/24HR TD PT24
21.0000 mg | MEDICATED_PATCH | Freq: Every day | TRANSDERMAL | Status: DC
  Administered 2020-09-16 – 2020-09-18 (×3): 21 mg via TRANSDERMAL
  Filled 2020-09-16 (×3): qty 1

## 2020-09-16 MED ORDER — ADULT MULTIVITAMIN W/MINERALS CH
1.0000 | ORAL_TABLET | Freq: Every day | ORAL | Status: DC
  Administered 2020-09-16 – 2020-09-18 (×3): 1 via ORAL
  Filled 2020-09-16 (×3): qty 1

## 2020-09-16 MED ORDER — SERTRALINE HCL 50 MG PO TABS
100.0000 mg | ORAL_TABLET | Freq: Every day | ORAL | Status: DC
  Administered 2020-09-16 – 2020-09-17 (×2): 100 mg via ORAL
  Filled 2020-09-16 (×2): qty 2

## 2020-09-16 MED ORDER — LORAZEPAM 2 MG/ML IJ SOLN: 0.0000 mg | Freq: Two times a day (BID) | INTRAMUSCULAR | Status: DC

## 2020-09-16 MED ORDER — MORPHINE SULFATE (PF) 4 MG/ML IV SOLN: 4.0000 mg | Freq: Once | INTRAVENOUS | Status: AC

## 2020-09-16 MED ORDER — FENTANYL CITRATE (PF) 100 MCG/2ML IJ SOLN: 50.0000 ug | INTRAMUSCULAR | Status: DC | PRN

## 2020-09-16 MED ORDER — AMLODIPINE BESYLATE 10 MG PO TABS
10.0000 mg | ORAL_TABLET | Freq: Every day | ORAL | Status: DC
  Administered 2020-09-16 – 2020-09-18 (×3): 10 mg via ORAL
  Filled 2020-09-16 (×3): qty 1

## 2020-09-16 MED ORDER — OXYCODONE HCL 5 MG PO TABS
5.0000 mg | ORAL_TABLET | Freq: Four times a day (QID) | ORAL | Status: DC | PRN
  Administered 2020-09-17 (×4): 5 mg via ORAL
  Filled 2020-09-16 (×4): qty 1

## 2020-09-16 MED ORDER — THIAMINE HCL 100 MG/ML IJ SOLN: 100.0000 mg | Freq: Every day | INTRAMUSCULAR | Status: DC

## 2020-09-16 MED ORDER — LORAZEPAM 2 MG/ML IJ SOLN
1.0000 mg | INTRAMUSCULAR | Status: DC | PRN
  Administered 2020-09-16 – 2020-09-17 (×2): 1 mg via INTRAVENOUS
  Administered 2020-09-17: 2 mg via INTRAVENOUS
  Filled 2020-09-16 (×2): qty 1

## 2020-09-16 MED ORDER — ALBUTEROL SULFATE HFA 108 (90 BASE) MCG/ACT IN AERS
2.0000 | INHALATION_SPRAY | RESPIRATORY_TRACT | Status: DC | PRN
  Filled 2020-09-16: qty 6.7

## 2020-09-16 MED ORDER — MORPHINE SULFATE (PF) 4 MG/ML IV SOLN
4.0000 mg | Freq: Once | INTRAVENOUS | Status: AC
  Administered 2020-09-16: 4 mg via INTRAVENOUS
  Filled 2020-09-16: qty 1

## 2020-09-16 MED ORDER — FENTANYL CITRATE (PF) 100 MCG/2ML IJ SOLN
INTRAMUSCULAR | Status: AC
  Administered 2020-09-16: 50 ug via NASAL
  Filled 2020-09-16: qty 2

## 2020-09-16 MED ORDER — SODIUM CHLORIDE 0.9 % IV SOLN
1000.0000 mL | Freq: Once | INTRAVENOUS | Status: AC
  Administered 2020-09-16: 1000 mL via INTRAVENOUS

## 2020-09-16 MED ORDER — THIAMINE HCL 100 MG PO TABS
100.0000 mg | ORAL_TABLET | Freq: Every day | ORAL | Status: DC
  Administered 2020-09-16 – 2020-09-18 (×3): 100 mg via ORAL
  Filled 2020-09-16 (×3): qty 1

## 2020-09-16 MED ORDER — SODIUM CHLORIDE 0.9 % IV SOLN: INTRAVENOUS | Status: DC

## 2020-09-16 MED ORDER — LORAZEPAM 1 MG PO TABS: 1.0000 mg | ORAL_TABLET | ORAL | Status: DC | PRN

## 2020-09-16 MED ORDER — HYDROMORPHONE HCL 1 MG/ML IJ SOLN
1.0000 mg | INTRAMUSCULAR | Status: DC | PRN
  Administered 2020-09-16 – 2020-09-18 (×10): 1 mg via INTRAVENOUS
  Filled 2020-09-16 (×10): qty 1

## 2020-09-16 MED ORDER — MORPHINE SULFATE (PF) 4 MG/ML IV SOLN
INTRAVENOUS | Status: AC
  Administered 2020-09-16: 4 mg via INTRAVENOUS
  Filled 2020-09-16: qty 1

## 2020-09-16 MED ORDER — PANTOPRAZOLE SODIUM 40 MG IV SOLR
40.0000 mg | Freq: Two times a day (BID) | INTRAVENOUS | Status: DC
  Administered 2020-09-16 – 2020-09-18 (×4): 40 mg via INTRAVENOUS
  Filled 2020-09-16 (×4): qty 40

## 2020-09-16 MED ORDER — ATORVASTATIN CALCIUM 20 MG PO TABS
40.0000 mg | ORAL_TABLET | Freq: Every day | ORAL | Status: DC
  Administered 2020-09-17 – 2020-09-18 (×2): 40 mg via ORAL
  Filled 2020-09-16 (×2): qty 2

## 2020-09-16 MED ORDER — NITROGLYCERIN 0.4 MG SL SUBL: 0.4000 mg | SUBLINGUAL_TABLET | SUBLINGUAL | Status: DC | PRN

## 2020-09-16 MED ORDER — HYDROXYZINE HCL 50 MG/ML IM SOLN
25.0000 mg | Freq: Four times a day (QID) | INTRAMUSCULAR | Status: DC | PRN
  Administered 2020-09-17: 25 mg via INTRAMUSCULAR
  Filled 2020-09-16 (×3): qty 0.5

## 2020-09-16 MED ORDER — FOLIC ACID 1 MG PO TABS
1.0000 mg | ORAL_TABLET | Freq: Every day | ORAL | Status: DC
  Administered 2020-09-16 – 2020-09-18 (×3): 1 mg via ORAL
  Filled 2020-09-16 (×3): qty 1

## 2020-09-16 MED ORDER — HYDROMORPHONE HCL 1 MG/ML IJ SOLN
1.0000 mg | Freq: Once | INTRAMUSCULAR | Status: AC
  Administered 2020-09-16: 1 mg via INTRAVENOUS
  Filled 2020-09-16: qty 1

## 2020-09-16 MED ORDER — PANTOPRAZOLE SODIUM 40 MG IV SOLR
40.0000 mg | Freq: Once | INTRAVENOUS | Status: AC
  Administered 2020-09-16: 40 mg via INTRAVENOUS
  Filled 2020-09-16: qty 40

## 2020-09-16 MED ORDER — HYDRALAZINE HCL 20 MG/ML IJ SOLN
5.0000 mg | INTRAMUSCULAR | Status: DC | PRN
  Administered 2020-09-16: 5 mg via INTRAVENOUS
  Filled 2020-09-16 (×2): qty 1

## 2020-09-16 MED ORDER — PERFLUTREN LIPID MICROSPHERE
1.0000 mL | INTRAVENOUS | Status: AC | PRN
  Administered 2020-09-16: 2 mL via INTRAVENOUS
  Filled 2020-09-16: qty 10

## 2020-09-16 MED ORDER — MIRTAZAPINE 15 MG PO TABS
15.0000 mg | ORAL_TABLET | Freq: Every day | ORAL | Status: DC
  Administered 2020-09-16 – 2020-09-17 (×2): 15 mg via ORAL
  Filled 2020-09-16 (×2): qty 1

## 2020-09-16 MED ORDER — ONDANSETRON HCL 4 MG/2ML IJ SOLN
4.0000 mg | Freq: Once | INTRAMUSCULAR | Status: AC
  Administered 2020-09-16: 4 mg via INTRAVENOUS
  Filled 2020-09-16: qty 2

## 2020-09-16 MED ORDER — LORAZEPAM 2 MG/ML IJ SOLN
0.0000 mg | Freq: Four times a day (QID) | INTRAMUSCULAR | Status: DC
  Administered 2020-09-17: 1 mg via INTRAVENOUS
  Administered 2020-09-17: 2 mg via INTRAVENOUS
  Administered 2020-09-17: 1 mg via INTRAVENOUS
  Filled 2020-09-16 (×4): qty 1

## 2020-09-16 NOTE — ED Notes (Signed)
Assumed care of pt at this time. Pt states pain 10/10. Educated to pain medication orders and frequency. Pt oriented to room and how to call for needs.

## 2020-09-16 NOTE — ED Notes (Signed)
This RN to bedside, UA collected by this RN. Pt redirected back to room after patient visualized walking out of room with IV in place. Pt back to room and placed back on monitors at this time.

## 2020-09-16 NOTE — H&P (Addendum)
History and Physical    Mario Beck EPP:[id number] DOB: [birth year] DOA: 09/16/2020  Referring MD/NP/PA:   PCP: Patient, No Pcp Per   Patient coming from:  The patient is coming from home.  At baseline, pt is independent for most of ADL.        Chief Complaint: Abdominal pain  HPI: Mario Beck is a 39 y.o. male with medical history significant of tobacco abuse, alcohol abuse, alcoholic pancreatitis, hypertension, asthma, GERD, depression, who presents with abdominal pain.  Patient states that he has been having abdominal pain for more than 2 days, which is located in the epigastric area, burning-like pain, severe, 10 out of 10 severity, nonradiating.  It is associated with nausea and vomiting.  Patient states that he has had three times of nonbilious nonbloody vomiting.  No diarrhea.  Denies fever or chills.  Patient denies chest pain, cough, shortness breath.  No symptoms of UTI. Patient also reports dark stool, but states that he does not think it is because of blood.  ED Course: pt was found to have hemoglobin 15.7, lipase 126, negative Covid PCR, trop 25, electrolytes renal function okay, liver function (ALP 75, AST 61, ALT 47, total bilirubin 1.2), temperature normal, blood pressure 125/109, tachycardia, RR 16, oxygen saturation 99% on room air.  Patient is placed on MedSurg bed for observation.  Review of Systems:   General: no fevers, chills, no body weight gain, has fatigue HEENT: no blurry vision, hearing changes or sore throat Respiratory: no dyspnea, coughing, wheezing CV: no chest pain, no palpitations GI: has nausea, vomiting, abdominal pain, no diarrhea, constipation GU: no dysuria, burning on urination, increased urinary frequency, hematuria  Ext: no leg edema Neuro: no unilateral weakness, numbness, or tingling, no vision change or hearing loss Skin: no rash, no skin tear. MSK: No muscle spasm, no deformity, no limitation of range of movement in spin Heme: No easy  bruising.  Travel history: No recent long distant travel.  Allergy:  Allergies  Allergen Reactions  . Lisinopril Swelling    Angioedema   . Shellfish Allergy Swelling    Past Medical History:  Diagnosis Date  . Alcohol abuse   . Asthma   . Depression   . Hypertension     Past Surgical History:  Procedure Laterality Date  . FRACTURE SURGERY     Left leg as a kid    Social History:  reports that he has been smoking cigarettes. He has been smoking about 1.00 pack per day. His smokeless tobacco use includes snuff. He reports current alcohol use of about 7.0 standard drinks of alcohol per week. He reports that he does not use drugs.  Family History:  Family History  Problem Relation Age of Onset  . Colon cancer Neg Hx   . Esophageal cancer Neg Hx      Prior to Admission medications   Medication Sig Start Date End Date Taking? Authorizing Provider  acetaminophen (TYLENOL) 500 MG tablet Take 500 mg by mouth every 6 (six) hours as needed.    [provider]  amLODipine (NORVASC) 10 MG tablet Take 1 tablet (10 mg total) by mouth daily. 04/03/20 05/03/20  Charise Killian, MD  folic acid (FOLVITE) 1 MG tablet Take 1 tablet (1 mg total) by mouth daily. 02/24/20   [name], DO  gabapentin (NEURONTIN) 300 MG capsule Take 1 capsule (300 mg total) by mouth 4 (four) times daily. 04/03/20 05/03/20  Charise Killian, MD  mirtazapine (REMERON) 15 MG tablet  Take 1 tablet (15 mg total) by mouth at bedtime. 02/23/20   [name], DO  ondansetron (ZOFRAN) 4 MG tablet Take 1 tablet (4 mg total) by mouth every 6 (six) hours as needed for nausea or vomiting. 02/23/20   Pennie Banter, DO  pantoprazole (PROTONIX) 40 MG tablet Take 1 tablet (40 mg total) by mouth 2 (two) times daily. 04/03/20 05/03/20  Charise Killian, MD  sertraline (ZOLOFT) 100 MG tablet Take 1 tablet (100 mg total) by mouth daily in the afternoon. 04/03/20 05/03/20  Charise Killian, MD  thiamine 100  MG tablet Take 1 tablet (100 mg total) by mouth daily. Patient not taking: Reported on 03/31/2020 02/24/20   Pennie Banter, DO    Physical Exam: Vitals:   09/16/20 1330 09/16/20 1445 09/16/20 1620 09/16/20 1739  BP: (!) 125/109 (!) 165/105 (!) 142/74 (!) 166/99  Pulse: 99 87 84 90  Resp: 12 14 16 16   Temp:   98.2 F (36.8 C) 98.1 F (36.7 C)  TempSrc:   Oral Oral  SpO2: 99% 97% 98% 100%  Weight:      Height:       General: Not in acute distress HEENT:       Eyes: PERRL, EOMI, no scleral icterus.       ENT: No discharge from the ears and nose, no pharynx injection, no tonsillar enlargement.        Neck: No JVD, no bruit, no mass felt. Heme: No neck lymph node enlargement. Cardiac: S1/S2, RRR, No murmurs, No gallops or rubs. Respiratory: No rales, wheezing, rhonchi or rubs. GI: Soft, nondistended, has epigastric tenderness, no rebound pain, no organomegaly, BS present. GU: No hematuria Ext: No pitting leg edema bilaterally. 1+DP/PT pulse bilaterally. Musculoskeletal: No joint deformities, No joint redness or warmth, no limitation of ROM in spin. Skin: No rashes.  Neuro: Alert, oriented X3, cranial nerves II-XII grossly intact, moves all extremities normally Psych: Patient is not psychotic, no suicidal or hemocidal ideation.  Labs on Admission: I have personally reviewed following labs and imaging studies  CBC: Recent Labs  Lab 09/16/20 1043  WBC 9.1  HGB 15.7  HCT 43.5  MCV 79.4*  PLT 262   Basic Metabolic Panel: Recent Labs  Lab 09/16/20 1043  NA 137  K 3.6  CL 97*  CO2 25  GLUCOSE 157*  BUN 15  CREATININE 1.01  CALCIUM 10.2   GFR: Estimated Creatinine Clearance: 116.4 mL/min (by C-G formula based on SCr of 1.01 mg/dL). Liver Function Tests: Recent Labs  Lab 09/16/20 1043  AST 61*  ALT 47*  ALKPHOS 75  BILITOT 1.2  PROT 8.0  ALBUMIN 4.5   Recent Labs  Lab 09/16/20 1043  LIPASE 126*   No results for input(s): AMMONIA in the last 168  hours. Coagulation Profile: Recent Labs  Lab 09/16/20 1045  INR 0.9   Cardiac Enzymes: No results for input(s): CKTOTAL, CKMB, CKMBINDEX, TROPONINI in the last 168 hours. BNP (last 3 results) No results for input(s): PROBNP in the last 8760 hours. HbA1C: No results for input(s): HGBA1C in the last 72 hours. CBG: No results for input(s): GLUCAP in the last 168 hours. Lipid Profile: Recent Labs    09/16/20 1043  TRIG 188*   Thyroid Function Tests: No results for input(s): TSH, T4TOTAL, FREET4, T3FREE, THYROIDAB in the last 72 hours. Anemia Panel: No results for input(s): VITAMINB12, FOLATE, FERRITIN, TIBC, IRON, RETICCTPCT in the last 72 hours. Urine analysis:  Component Value Date/Time   COLORURINE YELLOW (A) 09/16/2020 1036   APPEARANCEUR HAZY (A) 09/16/2020 1036   LABSPEC 1.024 09/16/2020 1036   PHURINE 5.0 09/16/2020 1036   GLUCOSEU NEGATIVE 09/16/2020 1036   HGBUR SMALL (A) 09/16/2020 1036   BILIRUBINUR NEGATIVE 09/16/2020 1036   KETONESUR NEGATIVE 09/16/2020 1036   PROTEINUR 100 (A) 09/16/2020 1036   NITRITE NEGATIVE 09/16/2020 1036   LEUKOCYTESUR NEGATIVE 09/16/2020 1036   Sepsis Labs: @LABRCNTIP (procalcitonin:4,lacticidven:4) ) Recent Results (from the past 240 hour(s))  Resp Panel by RT-PCR (Flu A&B, Covid) Nasopharyngeal Swab     Status: None   Collection Time: 09/16/20  1:48 PM   Specimen: Nasopharyngeal Swab; Nasopharyngeal(NP) swabs in vial transport medium  Result Value Ref Range Status   SARS Coronavirus 2 by RT PCR NEGATIVE NEGATIVE Final    Comment: (NOTE) SARS-CoV-2 target nucleic acids are NOT DETECTED.  The SARS-CoV-2 RNA is generally detectable in upper respiratory specimens during the acute phase of infection. The lowest concentration of SARS-CoV-2 viral copies this assay can detect is 138 copies/mL. A negative result does not preclude SARS-Cov-2 infection and should not be used as the sole basis for treatment or other patient management  decisions. A negative result may occur with  improper specimen collection/handling, submission of specimen other than nasopharyngeal swab, presence of viral mutation(s) within the areas targeted by this assay, and inadequate number of viral copies(<138 copies/mL). A negative result must be combined with clinical observations, patient history, and epidemiological information. The expected result is Negative.  Fact Sheet for Patients:  BloggerCourse.com  Fact Sheet for Healthcare Providers:  SeriousBroker.it  This test is no t yet approved or cleared by the [place] FDA and  has been authorized for detection and/or diagnosis of SARS-CoV-2 by FDA under an Emergency Use Authorization (EUA). This EUA will remain  in effect (meaning this test can be used) for the duration of the COVID-19 declaration under Section 564(b)(1) of the Act, 21 U.S.C.section 360bbb-3(b)(1), unless the authorization is terminated  or revoked sooner.       Influenza A by PCR NEGATIVE NEGATIVE Final   Influenza B by PCR NEGATIVE NEGATIVE Final    Comment: (NOTE) The Xpert Xpress SARS-CoV-2/FLU/RSV plus assay is intended as an aid in the diagnosis of influenza from Nasopharyngeal swab specimens and should not be used as a sole basis for treatment. Nasal washings and aspirates are unacceptable for Xpert Xpress SARS-CoV-2/FLU/RSV testing.  Fact Sheet for Patients: BloggerCourse.com  Fact Sheet for Healthcare Providers: SeriousBroker.it  This test is not yet approved or cleared by the [place] FDA and has been authorized for detection and/or diagnosis of SARS-CoV-2 by FDA under an Emergency Use Authorization (EUA). This EUA will remain in effect (meaning this test can be used) for the duration of the COVID-19 declaration under Section 564(b)(1) of the Act, 21 U.S.C. section 360bbb-3(b)(1), unless the  authorization is terminated or revoked.  Performed at [facility], [address], [place], [place] [address]      Radiological Exams on Admission: No results found.   EKG: I have personally reviewed.  Sinus rhythm, QTC 549, LAE, T wave inversion in lead I/aVL, V3-V4, and in inferior leads.  Assessment/Plan Principal Problem:   Alcoholic pancreatitis Active Problems:   HTN (hypertension)   Alcohol use disorder, moderate, dependence (HCC)   Transaminitis   Tobacco abuse   GERD (gastroesophageal reflux disease)   Depression   Dark stools   Elevated troponin   Alcoholic pancreatitis: lipase 505. Pt  had negative US-RUQ on 03/30/20. -will place in med-surg bed for observation -NPO for pancreatitis -IVF: 1LNS and then at 150 cc/hr -prn IV dilaudid and oxycodone for pain control -prn IV zofran for nausea -IVprotonix for possible alcoholic gastritis --US-RUQ -check lipid panel to rule out triglyceridemia. -EKG, telemetry, CE x1, to evaluate ACS  HTN (hypertension): -IV hydralazine as needed -Amlodipine  Tobacco abuse and alcohol use disorder, moderate, dependence (HCC) -Did counseling about importance of quitting smoking and quitting drinking -Nicotine patch -CIWA protocol  Transaminitis: likely due to alcohol abuse. Pt had negative US-RUQ on 03/30/20. -Avoid using Tylenol  GERD (gastroesophageal reflux disease) -On IV Protonix  Depression -Zoloft and Remeron  Dark stools: Etiology is not clear.  Hemoglobin 15.7 on admission -Check FOBT -CBC every 6 hours -INR/PTT/type screen -IV Protonix 40 mg twice daily  Elevated troponin: Troponin 25.  Patient denies chest pain, EKG has T wave inversion in lead I/aVL, V3-V4, and inferior leads. - Trend Trop - Repeat EKG in the am  - prn Nitroglycerin, dilaudid - will not start ASA due to dark stool - start lipitor  - Risk factor stratification: will check FLP and A1C  - check UDS - 2d echo -Dr. Welton Flakes of card  is consulted        DVT ppx: SCD Code Status: Full code Family Communication: not done, no family member is at bed side.   Disposition Plan:  Anticipate discharge back to previous environment Consults called: Dr. Welton Flakes of card Admission status: Med-surg bed for obs    Status is: Observation  The patient remains OBS appropriate and will d/c before 2 midnights.  Dispo: The patient is from: Home              Anticipated d/c is to: Home              Anticipated d/c date is: 1 day              Patient currently is not medically stable to d/c.         Date of Service 09/16/2020    Lorretta Harp [facility]   If 7PM-7AM, please contact night-coverage [email or web address] 09/16/2020, 5:53 PM

## 2020-09-16 NOTE — ED Notes (Signed)
Family at bedside at this time

## 2020-09-16 NOTE — ED Provider Notes (Signed)
[facility] Emergency Department Provider Note   ____________________________________________    I have reviewed the triage vital signs and the nursing notes.   HISTORY  Chief Complaint     HPI Mario Beck is a 39 y.o. male who presents with 2 days of epigastric abdominal pain which he describes as burning and without significant radiation. Denies fevers or chills, some nausea no vomiting. Reports firm stools, darker than typical although not consistent with melena. Not on blood thinners. Has a history of pancreatitis in the past, reports he has cut his alcohol usage down significantly but still does drink occasionally.  Past Medical History:  Diagnosis Date  . Alcohol abuse   . Asthma   . Depression   . Hypertension     Patient Active Problem List   Diagnosis Date Noted  . Transaminitis 03/31/2020  . Nausea and vomiting 02/21/2020  . Intractable nausea and vomiting 02/21/2020  . Tobacco abuse counseling   . Acute alcoholic pancreatitis 02/18/2020  . HTN (hypertension) 02/18/2020  . Alcohol use disorder, moderate, dependence (HCC) 02/18/2020  . Acute pancreatitis 02/18/2020    Past Surgical History:  Procedure Laterality Date  . FRACTURE SURGERY     Left leg as a kid    Prior to Admission medications   Medication Sig Start Date End Date Taking? Authorizing Provider  acetaminophen (TYLENOL) 500 MG tablet Take 500 mg by mouth every 6 (six) hours as needed.    [provider]  amLODipine (NORVASC) 10 MG tablet Take 1 tablet (10 mg total) by mouth daily. 04/03/20 05/03/20  Charise Killian, MD  folic acid (FOLVITE) 1 MG tablet Take 1 tablet (1 mg total) by mouth daily. 02/24/20   [name], DO  gabapentin (NEURONTIN) 300 MG capsule Take 1 capsule (300 mg total) by mouth 4 (four) times daily. 04/03/20 05/03/20  Charise Killian, MD  mirtazapine (REMERON) 15 MG tablet Take 1 tablet (15 mg total) by mouth at bedtime. 02/23/20    [name], DO  ondansetron (ZOFRAN) 4 MG tablet Take 1 tablet (4 mg total) by mouth every 6 (six) hours as needed for nausea or vomiting. 02/23/20   Pennie Banter, DO  pantoprazole (PROTONIX) 40 MG tablet Take 1 tablet (40 mg total) by mouth 2 (two) times daily. 04/03/20 05/03/20  Charise Killian, MD  sertraline (ZOLOFT) 100 MG tablet Take 1 tablet (100 mg total) by mouth daily in the afternoon. 04/03/20 05/03/20  Charise Killian, MD  thiamine 100 MG tablet Take 1 tablet (100 mg total) by mouth daily. Patient not taking: Reported on 03/31/2020 02/24/20   [name], DO     Allergies Lisinopril and Shellfish allergy  Family History  Problem Relation Age of Onset  . Colon cancer Neg Hx   . Esophageal cancer Neg Hx     Social History Social History   Tobacco Use  . Smoking status: Current Every Day Smoker    Packs/day: 1.00    Types: Cigarettes  . Smokeless tobacco: Current User    Types: Snuff  . Tobacco comment: Does pouches as welll   Vaping Use  . Vaping Use: Never used  Substance Use Topics  . Alcohol use: Yes    Alcohol/week: 7.0 standard drinks    Types: 7 Cans of beer per week  . Drug use: No    Review of Systems  Constitutional: No fever/chills Eyes: No visual changes.  ENT: No sore throat. Cardiovascular: Denies  chest pain. Respiratory: Denies shortness of breath. Gastrointestinal: As above Genitourinary: Negative for dysuria. Musculoskeletal: Negative for back pain. Skin: Negative for rash. Neurological: Negative for headaches    ____________________________________________   PHYSICAL EXAM:  VITAL SIGNS: ED Triage Vitals  Enc Vitals Group     BP 09/16/20 1037 (!) 137/91     Pulse Rate 09/16/20 1037 (!) 104     Resp 09/16/20 1037 16     Temp 09/16/20 1037 97.8 F (36.6 C)     Temp Source 09/16/20 1037 Oral     SpO2 09/16/20 1037 100 %     Weight 09/16/20 1034 90.7 kg (200 lb)     Height 09/16/20 1034 1.88 m (6\' 2" )     Head  Circumference --      Peak Flow --      Pain Score 09/16/20 1034 10     Pain Loc --      Pain Edu? --      Excl. in GC? --     Constitutional: Alert and oriented.  Nose: No congestion/rhinnorhea. Mouth/Throat: Mucous membranes are moist.    Cardiovascular: Normal rate, regular rhythm.   Good peripheral circulation. Respiratory: Normal respiratory effort.  No retractions. Gastrointestinal: Soft, mild epigastric tenderness palpation  Musculoskeletal:   Warm and well perfused Neurologic:  Normal speech and language. No gross focal neurologic deficits are appreciated.  Skin:  Skin is warm, dry and intact. No rash noted. Psychiatric: Mood and affect are normal. Speech and behavior are normal.  ____________________________________________   LABS (all labs ordered are listed, but only abnormal results are displayed)  Labs Reviewed  LIPASE, BLOOD - Abnormal; Notable for the following components:      Result Value   Lipase 126 (*)    All other components within normal limits  COMPREHENSIVE METABOLIC PANEL - Abnormal; Notable for the following components:   Chloride 97 (*)    Glucose, Bld 157 (*)    AST 61 (*)    ALT 47 (*)    All other components within normal limits  CBC - Abnormal; Notable for the following components:   MCV 79.4 (*)    MCHC 36.1 (*)    All other components within normal limits  RESP PANEL BY RT-PCR (FLU A&B, COVID) [id number]  URINALYSIS, COMPLETE (UACMP) WITH MICROSCOPIC  TYPE AND SCREEN   ____________________________________________  EKG  ED ECG REPORT I, 14/01/21, the attending physician, personally viewed and interpreted this ECG.  Date: 09/16/2020  Rhythm: normal sinus rhythm QRS Axis: normal Intervals: Prolonged QT ST/T Wave abnormalities: normal Narrative Interpretation: no evidence of acute ischemia  ____________________________________________  RADIOLOGY  None ____________________________________________   PROCEDURES   Procedure(s) performed: No  Procedures   Critical Care performed: No ____________________________________________   INITIAL IMPRESSION / ASSESSMENT AND PLAN / ED COURSE  Pertinent labs & imaging results that were available during my care of the patient were reviewed by me and considered in my medical decision making (see chart for details).  Patient presents with epigastric pain as detailed above, given history of alcohol abuse and pancreatitis suspicious for pancreatitis, gastritis, abdominal exam with minimal tenderness overall reassuring.  Lipase noted to be elevated at 126  Blood work is otherwise reassuring. Patient treated with IV morphine x2, IV fluids, IV Zofran  Patient with continued pain, will give IV Dilaudid and admit to the hospital service given continued symptoms   ____________________________________________   FINAL CLINICAL IMPRESSION(S) / ED DIAGNOSES  Final diagnoses:  Acute pancreatitis, unspecified  complication status, unspecified pancreatitis type        Note:  This document was prepared using Dragon voice recognition software and may include unintentional dictation errors.   Jene Every, MD 09/16/20 1427

## 2020-09-16 NOTE — ED Notes (Signed)
Pt c/o continued 10/10 pain at this time. EDP Kinner made aware.

## 2020-09-16 NOTE — ED Notes (Addendum)
Kinner MD made aware of pt pain level unchanged at this time. Given verbal order with read back for 4mg  morphine IV at this time.

## 2020-09-16 NOTE — ED Notes (Signed)
Called lab regarding covid swabs. Per Benson Norway in lab, will send more swabs.

## 2020-09-16 NOTE — ED Triage Notes (Signed)
C/O dark stools and abdominal pain x 1 day.

## 2020-09-16 NOTE — ED Notes (Signed)
Message sent to pharmacy requesting medications be verified.

## 2020-09-16 NOTE — ED Notes (Signed)
Pt provided with urinal at this time

## 2020-09-16 NOTE — ED Notes (Signed)
Admitting provider at bedside.

## 2020-09-16 NOTE — ED Notes (Signed)
EDP Kinner at bedside.  

## 2020-09-16 NOTE — ED Notes (Signed)
Secretary messaged to put in transport for pt at this time.

## 2020-09-16 NOTE — ED Notes (Signed)
This RN to bedside, pt resting in bed with family member at bedside. Pt visualized in NAD at this time. Pt states IV pain medication not working, c/o continued pain 10/10 at this time. This RN explained that this RN would notify EDP and request more pain medication however due to patient recently having 2nd dose of IV pain medication, unsure what EDP could write. EDP Kinner made aware of patient c/o continued 10/10 pain, no new orders at this time

## 2020-09-17 ENCOUNTER — Other Ambulatory Visit: Payer: Self-pay

## 2020-09-17 DIAGNOSIS — F1020 Alcohol dependence, uncomplicated: Secondary | ICD-10-CM

## 2020-09-17 DIAGNOSIS — I5022 Chronic systolic (congestive) heart failure: Secondary | ICD-10-CM

## 2020-09-17 DIAGNOSIS — I426 Alcoholic cardiomyopathy: Secondary | ICD-10-CM

## 2020-09-17 DIAGNOSIS — R195 Other fecal abnormalities: Secondary | ICD-10-CM

## 2020-09-17 LAB — COMPREHENSIVE METABOLIC PANEL
ALT: 32 U/L (ref 0–44)
AST: 28 U/L (ref 15–41)
Albumin: 4 g/dL (ref 3.5–5.0)
Alkaline Phosphatase: 66 U/L (ref 38–126)
Anion gap: 11 (ref 5–15)
BUN: 7 mg/dL (ref 6–20)
CO2: 24 mmol/L (ref 22–32)
Calcium: 9.1 mg/dL (ref 8.9–10.3)
Chloride: 101 mmol/L (ref 98–111)
Creatinine, Ser: 0.75 mg/dL (ref 0.61–1.24)
GFR, Estimated: >60 mL/min (ref >60)
Glucose, Bld: 160 mg/dL — ABNORMAL HIGH (ref 70–99)
Potassium: 3.8 mmol/L (ref 3.5–5.1)
Sodium: 136 mmol/L (ref 135–145)
Total Bilirubin: 1 mg/dL (ref 0.3–1.2)
Total Protein: 7.1 g/dL (ref 6.5–8.1)

## 2020-09-17 LAB — CBC
HCT: 37.6 % — ABNORMAL LOW (ref 39.0–52.0)
HCT: 40.4 % (ref 39.0–52.0)
HCT: 39 % (ref 39.0–52.0)
HCT: 36.3 % — ABNORMAL LOW (ref 39.0–52.0)
Hemoglobin: 13.2 g/dL (ref 13.0–17.0)
Hemoglobin: 14.1 g/dL (ref 13.0–17.0)
Hemoglobin: 13.7 g/dL (ref 13.0–17.0)
Hemoglobin: 12.6 g/dL — ABNORMAL LOW (ref 13.0–17.0)
MCH: 28.5 pg (ref 26.0–34.0)
MCH: 28.3 pg (ref 26.0–34.0)
MCH: 28.4 pg (ref 26.0–34.0)
MCH: 28.4 pg (ref 26.0–34.0)
MCHC: 35.1 g/dL (ref 30.0–36.0)
MCHC: 34.9 g/dL (ref 30.0–36.0)
MCHC: 35.1 g/dL (ref 30.0–36.0)
MCHC: 34.7 g/dL (ref 30.0–36.0)
MCV: 81.2 fL (ref 80.0–100.0)
MCV: 81.1 fL (ref 80.0–100.0)
MCV: 80.9 fL (ref 80.0–100.0)
MCV: 81.8 fL (ref 80.0–100.0)
Platelets: 184 10*3/uL (ref 150–400)
Platelets: 210 10*3/uL (ref 150–400)
Platelets: 188 10*3/uL (ref 150–400)
Platelets: 176 10*3/uL (ref 150–400)
RBC: 4.63 MIL/uL (ref 4.22–5.81)
RBC: 4.98 MIL/uL (ref 4.22–5.81)
RBC: 4.82 MIL/uL (ref 4.22–5.81)
RBC: 4.44 MIL/uL (ref 4.22–5.81)
RDW: 12.2 % (ref 11.5–15.5)
RDW: 12.4 % (ref 11.5–15.5)
RDW: 12.7 % (ref 11.5–15.5)
RDW: 12.5 % (ref 11.5–15.5)
WBC: 9.5 10*3/uL (ref 4.0–10.5)
WBC: 9.8 10*3/uL (ref 4.0–10.5)
WBC: 9.6 10*3/uL (ref 4.0–10.5)
WBC: 9.5 10*3/uL (ref 4.0–10.5)
nRBC: 0 % (ref 0.0–0.2)
nRBC: 0 % (ref 0.0–0.2)
nRBC: 0 % (ref 0.0–0.2)
nRBC: 0 % (ref 0.0–0.2)

## 2020-09-17 LAB — ECHOCARDIOGRAM COMPLETE
Area-P 1/2: 3.99 cm2
Height: 74 in
S' Lateral: 5.47 cm
Weight: 3200 oz

## 2020-09-17 LAB — LIPID PANEL
Cholesterol: 232 mg/dL — ABNORMAL HIGH (ref 0–200)
HDL: 134 mg/dL (ref >40)
LDL Cholesterol: 65 mg/dL (ref 0–99)
Total CHOL/HDL Ratio: 1.7 RATIO
Triglycerides: 165 mg/dL — ABNORMAL HIGH (ref <150)
VLDL: 33 mg/dL (ref 0–40)

## 2020-09-17 LAB — LIPASE, BLOOD: Lipase: 123 U/L — ABNORMAL HIGH (ref 11–51)

## 2020-09-17 LAB — HEMOGLOBIN A1C
Hgb A1c MFr Bld: 6 % — ABNORMAL HIGH (ref 4.8–5.6)
Mean Plasma Glucose: 125.5 mg/dL

## 2020-09-17 MED ORDER — CARVEDILOL 6.25 MG PO TABS
6.2500 mg | ORAL_TABLET | Freq: Two times a day (BID) | ORAL | Status: DC
  Administered 2020-09-17 – 2020-09-18 (×2): 6.25 mg via ORAL
  Filled 2020-09-17 (×2): qty 1

## 2020-09-17 MED ORDER — SPIRONOLACTONE 25 MG PO TABS
25.0000 mg | ORAL_TABLET | Freq: Every day | ORAL | Status: DC
  Administered 2020-09-17 – 2020-09-18 (×2): 25 mg via ORAL
  Filled 2020-09-17 (×2): qty 1

## 2020-09-17 NOTE — Consult Note (Signed)
Mario Beck is a 39 y.o. male  [id number]  Primary Cardiologist: Adrian Blackwater Reason for Consultation: Abnormal EKG  HPI: Patient is a 39 year old male with past medical history of hypertension, alcohol / tobacco abuse, pancreatitis, and GERD. Patient presents to the emergency department with epigastric pain, nausea, and vomiting. Patient found to have acute on chronic pancreatitis. Vela Prose been consulted as patient was found to have an abnormal EKG.   Review of Systems: Denies chest pain or shortness of breath. Patient's only complaint is epigastric pain.   Past Medical History:  Diagnosis Date  . Alcohol abuse   . Asthma   . Depression   . Hypertension     Medications Prior to Admission  Medication Sig Dispense Refill  . amLODipine (NORVASC) 10 MG tablet Take 1 tablet (10 mg total) by mouth daily. 30 tablet 0  . mirtazapine (REMERON) 15 MG tablet Take 1 tablet (15 mg total) by mouth at bedtime. 30 tablet 2  . sertraline (ZOLOFT) 100 MG tablet Take 1 tablet (100 mg total) by mouth daily in the afternoon. 30 tablet 0     . amLODipine  10 mg Oral Daily  . atorvastatin  40 mg Oral Daily  . folic acid  1 mg Oral Daily  . LORazepam  0-4 mg Intravenous Q6H   Followed by  . [START ON 09/18/2020] LORazepam  0-4 mg Intravenous Q12H  . mirtazapine  15 mg Oral QHS  . multivitamin with minerals  1 tablet Oral Daily  . nicotine  21 mg Transdermal Daily  . pantoprazole (PROTONIX) IV  40 mg Intravenous Q12H  . sertraline  100 mg Oral Q1500  . thiamine  100 mg Oral Daily   Or  . thiamine  100 mg Intravenous Daily    Infusions: . sodium chloride 150 mL/hr at 09/17/20 0909    Allergies  Allergen Reactions  . Lisinopril Swelling    Angioedema   . Shellfish Allergy Swelling    Social History   Socioeconomic History  . Marital status: Married    Spouse name: Not on file  . Number of children: 2  . Years of education: Not on file  . Highest education level: Not on file   Occupational History  . Occupation: Production Management  Tobacco Use  . Smoking status: Current Every Day Smoker    Packs/day: 1.00    Types: Cigarettes  . Smokeless tobacco: Current User    Types: Snuff  . Tobacco comment: Does pouches as welll   Vaping Use  . Vaping Use: Never used  Substance and Sexual Activity  . Alcohol use: Yes    Alcohol/week: 7.0 standard drinks    Types: 7 Cans of beer per week  . Drug use: No  . Sexual activity: Yes  Other Topics Concern  . Not on file  Social History Narrative  . Not on file   Social Determinants of Health   Financial Resource Strain:   . Difficulty of Paying Living Expenses: Not on file  Food Insecurity:   . Worried About Programme researcher, broadcasting/film/video in the Last Year: Not on file  . Ran Out of Food in the Last Year: Not on file  Transportation Needs:   . Lack of Transportation (Medical): Not on file  . Lack of Transportation (Non-Medical): Not on file  Physical Activity:   . Days of Exercise per Week: Not on file  . Minutes of Exercise per Session: Not on file  Stress:   .  Feeling of Stress : Not on file  Social Connections:   . Frequency of Communication with Friends and Family: Not on file  . Frequency of Social Gatherings with Friends and Family: Not on file  . Attends Religious Services: Not on file  . Active Member of Clubs or Organizations: Not on file  . Attends Banker Meetings: Not on file  . Marital Status: Not on file  Intimate Partner Violence:   . Fear of Current or Ex-Partner: Not on file  . Emotionally Abused: Not on file  . Physically Abused: Not on file  . Sexually Abused: Not on file    Family History  Problem Relation Age of Onset  . Colon cancer Neg Hx   . Esophageal cancer Neg Hx     PHYSICAL EXAM: Vitals:   09/17/20 0800 09/17/20 1225  BP: (!) 158/109 (!) 165/112  Pulse: (!) 104 (!) 108  Resp: 20 20  Temp: 98.6 F (37 C) 98.6 F (37 C)  SpO2: 99% 99%     Intake/Output  Summary (Last 24 hours) at 09/17/2020 1244 Last data filed at 09/17/2020 1200 Gross per 24 hour  Intake 1251.56 ml  Output 2225 ml  Net -973.44 ml    General:  Well appearing. No respiratory difficulty HEENT: normal Neck: supple. no JVD. Carotids 2+ bilat; no bruits. No lymphadenopathy or thryomegaly appreciated. Cor: PMI nondisplaced. ST. No rubs, gallops or murmurs. Lungs: clear Abdomen: soft, nontender, nondistended. No hepatosplenomegaly. No bruits or masses. Good bowel sounds. Extremities: no cyanosis, clubbing, rash, edema Neuro: alert & oriented x 3, cranial nerves grossly intact. moves all 4 extremities w/o difficulty. Affect pleasant.  ECG: NSR with Anteriolateral T wave inversion. 82/bpm  Results for orders placed or performed during the hospital encounter of 09/16/20 (from the past 24 hour(s))  Resp Panel by RT-PCR (Flu A&B, Covid) Nasopharyngeal Swab     Status: None   Collection Time: 09/16/20  1:48 PM   Specimen: Nasopharyngeal Swab; Nasopharyngeal(NP) swabs in vial transport medium  Result Value Ref Range   SARS Coronavirus 2 by RT PCR NEGATIVE NEGATIVE   Influenza A by PCR NEGATIVE NEGATIVE   Influenza B by PCR NEGATIVE NEGATIVE  Urine Drug Screen, Qualitative ([facility] only)     Status: Abnormal   Collection Time: 09/16/20  2:33 PM  Result Value Ref Range   Tricyclic, Ur Screen NONE DETECTED NONE DETECTED   Amphetamines, Ur Screen NONE DETECTED NONE DETECTED   MDMA (Ecstasy)Ur Screen NONE DETECTED NONE DETECTED   Cocaine Metabolite,Ur [place] NONE DETECTED NONE DETECTED   Opiate, Ur Screen POSITIVE (A) NONE DETECTED   Phencyclidine (PCP) Ur S NONE DETECTED NONE DETECTED   Cannabinoid 50 Ng, Ur [place] NONE DETECTED NONE DETECTED   Barbiturates, Ur Screen NONE DETECTED NONE DETECTED   Benzodiazepine, Ur Scrn NONE DETECTED NONE DETECTED   Methadone Scn, Ur NONE DETECTED NONE DETECTED  CBC     Status: Abnormal   Collection Time: 09/16/20  5:44 PM  Result Value Ref Range   WBC  11.3 (H) 4.0 - 10.5 K/uL   RBC 5.07 4.22 - 5.81 MIL/uL   Hemoglobin 14.5 13.0 - 17.0 g/dL   HCT 36.4 39 - 52 %   MCV 80.1 80.0 - 100.0 fL   MCH 28.6 26.0 - 34.0 pg   MCHC 35.7 30.0 - 36.0 g/dL   RDW 68.0 32.1 - 22.4 %   Platelets 222 150 - 400 K/uL   nRBC 0.0 0.0 - 0.2 %  Troponin I (High Sensitivity)     Status: Abnormal   Collection Time: 09/16/20  5:44 PM  Result Value Ref Range   Troponin I (High Sensitivity) 25 (H) <18 ng/L  CBC     Status: Abnormal   Collection Time: 09/16/20  8:24 PM  Result Value Ref Range   WBC 10.5 4.0 - 10.5 K/uL   RBC 4.87 4.22 - 5.81 MIL/uL   Hemoglobin 13.9 13.0 - 17.0 g/dL   HCT 16.1 (L) 39 - 52 %   MCV 79.5 (L) 80.0 - 100.0 fL   MCH 28.5 26.0 - 34.0 pg   MCHC 35.9 30.0 - 36.0 g/dL   RDW 09.6 04.5 - 40.9 %   Platelets 205 150 - 400 K/uL   nRBC 0.0 0.0 - 0.2 %  Troponin I (High Sensitivity)     Status: Abnormal   Collection Time: 09/16/20  8:24 PM  Result Value Ref Range   Troponin I (High Sensitivity) 26 (H) <18 ng/L  CBC     Status: Abnormal   Collection Time: 09/17/20  2:23 AM  Result Value Ref Range   WBC 9.5 4.0 - 10.5 K/uL   RBC 4.63 4.22 - 5.81 MIL/uL   Hemoglobin 13.2 13.0 - 17.0 g/dL   HCT 81.1 (L) 39 - 52 %   MCV 81.2 80.0 - 100.0 fL   MCH 28.5 26.0 - 34.0 pg   MCHC 35.1 30.0 - 36.0 g/dL   RDW 91.4 78.2 - 95.6 %   Platelets 184 150 - 400 K/uL   nRBC 0.0 0.0 - 0.2 %  Comprehensive metabolic panel     Status: Abnormal   Collection Time: 09/17/20  5:14 AM  Result Value Ref Range   Sodium 136 135 - 145 mmol/L   Potassium 3.8 3.5 - 5.1 mmol/L   Chloride 101 98 - 111 mmol/L   CO2 24 22 - 32 mmol/L   Glucose, Bld 160 (H) 70 - 99 mg/dL   BUN 7 6 - 20 mg/dL   Creatinine, Ser 2.13 0.61 - 1.24 mg/dL   Calcium 9.1 8.9 - 08.6 mg/dL   Total Protein 7.1 6.5 - 8.1 g/dL   Albumin 4.0 3.5 - 5.0 g/dL   AST 28 15 - 41 U/L   ALT 32 0 - 44 U/L   Alkaline Phosphatase 66 38 - 126 U/L   Total Bilirubin 1.0 0.3 - 1.2 mg/dL   GFR, Estimated  >57 >84 mL/min   Anion gap 11 5 - 15  Lipase, blood     Status: Abnormal   Collection Time: 09/17/20  5:14 AM  Result Value Ref Range   Lipase 123 (H) 11 - 51 U/L  Hemoglobin A1c     Status: Abnormal   Collection Time: 09/17/20  5:14 AM  Result Value Ref Range   Hgb A1c MFr Bld 6.0 (H) 4.8 - 5.6 %   Mean Plasma Glucose 125.5 mg/dL  Lipid panel     Status: Abnormal   Collection Time: 09/17/20  5:14 AM  Result Value Ref Range   Cholesterol 232 (H) 0 - 200 mg/dL   Triglycerides 696 (H) <150 mg/dL   HDL 295 >28 mg/dL   Total CHOL/HDL Ratio 1.7 RATIO   VLDL 33 0 - 40 mg/dL   LDL Cholesterol 65 0 - 99 mg/dL  CBC     Status: None   Collection Time: 09/17/20  8:47 AM  Result Value Ref Range   WBC 9.8 4.0 - 10.5 K/uL  RBC 4.98 4.22 - 5.81 MIL/uL   Hemoglobin 14.1 13.0 - 17.0 g/dL   HCT 16.1 39 - 52 %   MCV 81.1 80.0 - 100.0 fL   MCH 28.3 26.0 - 34.0 pg   MCHC 34.9 30.0 - 36.0 g/dL   RDW 09.6 04.5 - 40.9 %   Platelets 210 150 - 400 K/uL   nRBC 0.0 0.0 - 0.2 %   ECHOCARDIOGRAM COMPLETE  Result Date: 09/17/2020    ECHOCARDIOGRAM REPORT   Patient Name:   WAKE ACRE Date of Exam: 09/16/2020 Medical Rec #:  [id number]   Height:       74.0 in Accession #:    [id number]  Weight:       200.0 lb Date of Birth:  1980-11-16   BSA:          2.173 m Patient Age:    39 years    BP:           166/99 mmHg Patient Gender: M           HR:           95 bpm. Exam Location:  [facility] Procedure: 2D Echo, Cardiac Doppler, Color Doppler and Intracardiac            Opacification Agent Indications:     Elevated Troponin  History:         Patient has no prior history of Echocardiogram examinations.                  Risk Factors:Hypertension, Current Smoker and Alcohol Abuse.                  Per patient- History of COVID-19.  Sonographer:     Sedonia Small Rodgers-Jones Referring Phys:  [id number] NIU Diagnosing Phys: Adrian Blackwater MD IMPRESSIONS  1. Left ventricular ejection fraction, by estimation, is 30 to 35%. The left  ventricle has moderately decreased function. The left ventricle demonstrates global hypokinesis. The left ventricular internal cavity size was severely dilated. There is mild left ventricular hypertrophy. Left ventricular diastolic parameters are consistent with Grade II diastolic dysfunction (pseudonormalization).  2. Right ventricular systolic function is moderately reduced. The right ventricular size is moderately enlarged.  3. Left atrial size was severely dilated.  4. Right atrial size was severely dilated.  5. The mitral valve is grossly normal. Moderate mitral valve regurgitation.  6. The aortic valve is grossly normal. Aortic valve regurgitation is not visualized. Mild aortic valve sclerosis is present, with no evidence of aortic valve stenosis. FINDINGS  Left Ventricle: Left ventricular ejection fraction, by estimation, is 30 to 35%. The left ventricle has moderately decreased function. The left ventricle demonstrates global hypokinesis. Definity contrast agent was given IV to delineate the left ventricular endocardial borders. The left ventricular internal cavity size was severely dilated. There is mild left ventricular hypertrophy. Left ventricular diastolic parameters are consistent with Grade II diastolic dysfunction (pseudonormalization). Right Ventricle: The right ventricular size is moderately enlarged. No increase in right ventricular wall thickness. Right ventricular systolic function is moderately reduced. Left Atrium: Left atrial size was severely dilated. Right Atrium: Right atrial size was severely dilated. Pericardium: There is no evidence of pericardial effusion. Mitral Valve: The mitral valve is grossly normal. Moderate mitral valve regurgitation. Tricuspid Valve: The tricuspid valve is grossly normal. Tricuspid valve regurgitation is trivial. Aortic Valve: The aortic valve is grossly normal. Aortic valve regurgitation is not visualized. Mild aortic valve sclerosis is present, with no  evidence of  aortic valve stenosis. Pulmonic Valve: The pulmonic valve was grossly normal. Pulmonic valve regurgitation is trivial. Aorta: The aortic root was not well visualized. IAS/Shunts: No atrial level shunt detected by color flow Doppler.  LEFT VENTRICLE PLAX 2D LVIDd:         6.40 cm  Diastology LVIDs:         5.47 cm  LV e' medial:    8.27 cm/s LV PW:         0.95 cm  LV E/e' medial:  7.9 LV IVS:        0.74 cm  LV e' lateral:   6.53 cm/s LVOT diam:     2.50 cm  LV E/e' lateral: 10.0 LV SV:         64 LV SV Index:   29 LVOT Area:     4.91 cm  RIGHT VENTRICLE             IVC RV Basal diam:  4.28 cm     IVC diam: 1.57 cm RV S prime:     19.40 cm/s TAPSE (M-mode): 3.4 cm LEFT ATRIUM             Index       RIGHT ATRIUM           Index LA diam:        4.40 cm 2.02 cm/m  RA Area:     12.80 cm LA Vol (A2C):   67.1 ml 30.87 ml/m RA Volume:   27.70 ml  12.74 ml/m LA Vol (A4C):   66.3 ml 30.50 ml/m LA Biplane Vol: 67.5 ml 31.06 ml/m  AORTIC VALVE LVOT Vmax:   79.80 cm/s LVOT Vmean:  63.000 cm/s LVOT VTI:    0.130 m  AORTA Ao Root diam: 3.30 cm Ao Asc diam:  3.30 cm MITRAL VALVE               TRICUSPID VALVE MV Area (PHT): 3.99 cm    TR Peak grad:   30.5 mmHg MV Decel Time: 190 msec    TR Vmax:        276.00 cm/s MV E velocity: 65.50 cm/s MV A velocity: 62.60 cm/s  SHUNTS MV E/A ratio:  1.05        Systemic VTI:  0.13 m                            Systemic Diam: 2.50 cm Adrian Blackwater MD Electronically signed by Adrian Blackwater MD Signature Date/Time: 09/17/2020/8:27:58 AM    Final      ASSESSMENT AND PLAN: Patient presenting to the emergency department with acute abdominal pain with N/V. Patient never had chest pain, but does have an abnormal EKG. Patient states his pain is only epigastric. With abnormal EKG patient will have further CAD work up outpatient once his acute on chronic pancreatitis resolves. Echocardiogram does reveal moderate HFrEF. Patient found to have EF 30-35% with grade II diastolic  dysfunction, mild LVH, moderate MR, and moderately reduced RV systolic dysfunction. Patient tachycardic and hypertensive, most likely d/t uncontrolled pain as he describes his pain as 8/10. With HFrEF, will still start the patient on carvedilol and spironolactone. Will hold on starting Entresto as patient with a history of angioedema to lisinopril. Will continue to follow.  Chart Review, assessment, and plan development time spent, 43 minutes.  Maryelizabeth Kaufmann

## 2020-09-17 NOTE — Progress Notes (Signed)
CCMD called around 3:30am stating HR was 150. Patient had gotten up to go to the bathroom. HR is remaining in the 100-120 now. Patient's vital signs were BP: 138/106 and HR: 118 after he sat down and rested. HR is remaining in the 100-120.   Gave patient dose of Ativan 1 mg. Patient continues to have mild nausea with no vomiting, denies any other symptoms.  Yellow MEWS protocol was implemented.   Patient has received Hydralazine at 2311 to decrease BP. It was effective on systolic BP but diastolic continues to be elevated. Patient states that mid epigastric pain level comes down to a 6 after Dilaudid 1 mg Q 3hr is given but pain increases after 1-2 hours. Oxycodone 5 mg Q 6hr PRN has been given once between Dilaudid doses.

## 2020-09-17 NOTE — Progress Notes (Signed)
   09/17/20 0340  Assess: MEWS Score  Temp 98.1 F (36.7 C)  BP (!) 138/106  Pulse Rate (!) 118  Resp 18  Level of Consciousness Alert  SpO2 99 %  O2 Device Room Air  Assess: MEWS Score  MEWS Temp 0  MEWS Systolic 0  MEWS Pulse 2  MEWS RR 0  MEWS LOC 0  MEWS Score 2  MEWS Score Color Yellow  Assess: if the MEWS score is Yellow or Red  Were vital signs taken at a resting state? Yes  Focused Assessment Change from prior assessment (see assessment flowsheet)  Early Detection of Sepsis Score *See Row Information* Low  MEWS guidelines implemented *See Row Information* Yes  Treat  Pain Scale 0-10  Pain Score 7  Take Vital Signs  Increase Vital Sign Frequency  Yellow: Q 2hr X 2 then Q 4hr X 2, if remains yellow, continue Q 4hrs  Escalate  MEWS: Escalate Yellow: discuss with charge nurse/RN and consider discussing with provider and RRT  Notify: Charge Nurse/RN  Name of Charge Nurse/RN Notified Marcella, RN  Date Charge Nurse/RN Notified 09/17/20  Time Charge Nurse/RN Notified 0417  Notify: Provider  Provider Name/Title Webb Silversmith, NP  Date Provider Notified 09/17/20  Time Provider Notified [phone]  Notification Reason Change in status

## 2020-09-17 NOTE — Plan of Care (Signed)
  Problem: Education: Goal: Knowledge of General Education information will improve Description: Including pain rating scale, medication(s)/side effects and non-pharmacologic comfort measures Outcome: Progressing   Problem: Health Behavior/Discharge Planning: Goal: Ability to manage health-related needs will improve Outcome: Progressing   Problem: Clinical Measurements: Goal: Ability to maintain clinical measurements within normal limits will improve Outcome: Progressing Goal: Diagnostic test results will improve Outcome: Progressing Goal: Cardiovascular complication will be avoided Outcome: Progressing   Problem: Activity: Goal: Risk for activity intolerance will decrease Outcome: Progressing   Problem: Coping: Goal: Level of anxiety will decrease Outcome: Progressing   Problem: Pain Managment: Goal: General experience of comfort will improve Outcome: Not Progressing

## 2020-09-17 NOTE — Progress Notes (Signed)
PROGRESS NOTE    Mario Beck  WYO:[id number] DOB: 24-Jul-1981 DOA: 09/16/2020 PCP: Patient, No Pcp Per   Chief complaint.  Abdominal pain. Brief Narrative:  Mario Beck is a 39 y.o. male with medical history significant of tobacco abuse, alcohol abuse, alcoholic pancreatitis, hypertension, asthma, GERD, depression, who presents with abdominal pain. Patient lipase was not significant elevated.  He was diagnosed with alcoholic pancreatitis, was given fluids and pain medicine. Patient has minimal elevation of troponin at 26, cardiology has seen the patient.  Echocardiogram showed ejection fraction 30 to 35%.  Started on Aldactone and beta-blocker.  Assessment & Plan:   Principal Problem:   Alcoholic pancreatitis Active Problems:   HTN (hypertension)   Alcohol use disorder, moderate, dependence (HCC)   Transaminitis   Tobacco abuse   GERD (gastroesophageal reflux disease)   Depression   Dark stools   Elevated troponin  #1.  Acute alcoholic pancreatitis. Alcohol abuse. Patient abdominal pain seem to be improving.  Triglycerides not elevated.  Patient condition is improving, continue IV fluids with decreased dose.  Start liquid diet and advance as tolerated. Continue CIWA protocol, no evidence of withdrawal.  2.  Alcoholic cardiomyopathy. Chronic systolic congestive heart failure. Minimal elevation of troponin. Patient probably has alcoholic cardiomyopathy.  Appreciate cardiology consult.  Continue Aldactone and beta-blocker. Patient currently does not have exacerbation of congestive heart failure.   Patient has some volume depletion from pancreatitis.  Continue gentle rehydration with decreased dose.  3.  Dark stools. Protonix twice a day.  Pending stool studies.    DVT prophylaxis: SCDs Code Status: Full Family Communication: Girlfriend at bedside. Disposition Plan:  .   Status is: Inpatient  Remains inpatient appropriate because:Inpatient level of care appropriate due  to severity of illness   Dispo: The patient is from: Home              Anticipated d/c is to: Home              Anticipated d/c date is: 2 days              Patient currently is not medically stable to d/c.        I/O last 3 completed shifts: In: 1251.6 [I.V.:1251.6] Out: 1175 [Urine:1175] Total I/O In: -  Out: 1050 [Urine:1050]     Consultants:   Card  Procedures: None  Antimicrobials:None  Subjective: Patient still complaining abdominal pain, but seem to be better than yesterday.  Mild nausea, no vomiting.  Denies any fever or chills. No short of breath or cough. No dysuria hematuria. Headache or dizziness.  Objective: Vitals:   09/17/20 0340 09/17/20 0539 09/17/20 0800 09/17/20 1225  BP: (!) 138/106 (!) 143/89 (!) 158/109 (!) 165/112  Pulse: (!) 118 (!) 109 (!) 104 (!) 108  Resp: 18 20 20 20   Temp: 98.1 F (36.7 C) 98.1 F (36.7 C) 98.6 F (37 C) 98.6 F (37 C)  TempSrc: Oral  Oral Oral  SpO2: 99% 98% 99% 99%  Weight:      Height:        Intake/Output Summary (Last 24 hours) at 09/17/2020 1330 Last data filed at 09/17/2020 1200 Gross per 24 hour  Intake 1251.56 ml  Output 2225 ml  Net -973.44 ml   Filed Weights   09/16/20 1034  Weight: 90.7 kg    Examination:  General exam: Appears calm and comfortable  Respiratory system: Clear to auscultation. Respiratory effort normal. Cardiovascular system: S1 & S2 heard, RRR. No JVD, murmurs, rubs,  gallops or clicks. No pedal edema. Gastrointestinal system: Abdomen is nondistended, soft and nontender. No organomegaly or masses felt. Normal bowel sounds heard. Central nervous system: Alert and oriented. No focal neurological deficits. Extremities: Symmetric . Skin: No rashes, lesions or ulcers Psychiatry: Mood & affect appropriate.     Data Reviewed: I have personally reviewed following labs and imaging studies  CBC: Recent Labs  Lab 09/16/20 1043 09/16/20 1744 09/16/20 2024 09/17/20 0223  09/17/20 0847  WBC 9.1 11.3* 10.5 9.5 9.8  HGB 15.7 14.5 13.9 13.2 14.1  HCT 43.5 40.6 38.7* 37.6* 40.4  MCV 79.4* 80.1 79.5* 81.2 81.1  PLT 262 222 205 184 210   Basic Metabolic Panel: Recent Labs  Lab 09/16/20 1043 09/17/20 0514  NA 137 136  K 3.6 3.8  CL 97* 101  CO2 25 24  GLUCOSE 157* 160*  BUN 15 7  CREATININE 1.01 0.75  CALCIUM 10.2 9.1   GFR: Estimated Creatinine Clearance: 147 mL/min (by C-G formula based on SCr of 0.75 mg/dL). Liver Function Tests: Recent Labs  Lab 09/16/20 1043 09/17/20 0514  AST 61* 28  ALT 47* 32  ALKPHOS 75 66  BILITOT 1.2 1.0  PROT 8.0 7.1  ALBUMIN 4.5 4.0   Recent Labs  Lab 09/16/20 1043 09/17/20 0514  LIPASE 126* 123*   No results for input(s): AMMONIA in the last 168 hours. Coagulation Profile: Recent Labs  Lab 09/16/20 1045  INR 0.9   Cardiac Enzymes: No results for input(s): CKTOTAL, CKMB, CKMBINDEX, TROPONINI in the last 168 hours. BNP (last 3 results) No results for input(s): PROBNP in the last 8760 hours. HbA1C: Recent Labs    09/17/20 0514  HGBA1C 6.0*   CBG: No results for input(s): GLUCAP in the last 168 hours. Lipid Profile: Recent Labs    09/16/20 1043 09/17/20 0514  CHOL  --  232*  HDL  --  134  LDLCALC  --  65  TRIG 188* 165*  CHOLHDL  --  1.7   Thyroid Function Tests: No results for input(s): TSH, T4TOTAL, FREET4, T3FREE, THYROIDAB in the last 72 hours. Anemia Panel: No results for input(s): VITAMINB12, FOLATE, FERRITIN, TIBC, IRON, RETICCTPCT in the last 72 hours. Sepsis Labs: No results for input(s): PROCALCITON, LATICACIDVEN in the last 168 hours.  Recent Results (from the past 240 hour(s))  Resp Panel by RT-PCR (Flu A&B, Covid) Nasopharyngeal Swab     Status: None   Collection Time: 09/16/20  1:48 PM   Specimen: Nasopharyngeal Swab; Nasopharyngeal(NP) swabs in vial transport medium  Result Value Ref Range Status   SARS Coronavirus 2 by RT PCR NEGATIVE NEGATIVE Final    Comment:  (NOTE) SARS-CoV-2 target nucleic acids are NOT DETECTED.  The SARS-CoV-2 RNA is generally detectable in upper respiratory specimens during the acute phase of infection. The lowest concentration of SARS-CoV-2 viral copies this assay can detect is 138 copies/mL. A negative result does not preclude SARS-Cov-2 infection and should not be used as the sole basis for treatment or other patient management decisions. A negative result may occur with  improper specimen collection/handling, submission of specimen other than nasopharyngeal swab, presence of viral mutation(s) within the areas targeted by this assay, and inadequate number of viral copies(<138 copies/mL). A negative result must be combined with clinical observations, patient history, and epidemiological information. The expected result is Negative.  Fact Sheet for Patients:  BloggerCourse.com  Fact Sheet for Healthcare Providers:  SeriousBroker.it  This test is no t yet approved or cleared by the  [place] [employer] and  has been authorized for detection and/or diagnosis of SARS-CoV-2 by FDA under an TEFL teacher (EUA). This EUA will remain  in effect (meaning this test can be used) for the duration of the COVID-19 declaration under Section 564(b)(1) of the Act, 21 U.S.C.section 360bbb-3(b)(1), unless the authorization is terminated  or revoked sooner.       Influenza A by PCR NEGATIVE NEGATIVE Final   Influenza B by PCR NEGATIVE NEGATIVE Final    Comment: (NOTE) The Xpert Xpress SARS-CoV-2/FLU/RSV plus assay is intended as an aid in the diagnosis of influenza from Nasopharyngeal swab specimens and should not be used as a sole basis for treatment. Nasal washings and aspirates are unacceptable for Xpert Xpress SARS-CoV-2/FLU/RSV testing.  Fact Sheet for Patients: BloggerCourse.com  Fact Sheet for Healthcare  Providers: SeriousBroker.it  This test is not yet approved or cleared by the [place] FDA and has been authorized for detection and/or diagnosis of SARS-CoV-2 by FDA under an Emergency Use Authorization (EUA). This EUA will remain in effect (meaning this test can be used) for the duration of the COVID-19 declaration under Section 564(b)(1) of the Act, 21 U.S.C. section 360bbb-3(b)(1), unless the authorization is terminated or revoked.  Performed at [facility], [address], [place], [place] [address]          Radiology Studies: ECHOCARDIOGRAM COMPLETE  Result Date: 09/17/2020    ECHOCARDIOGRAM REPORT   Patient Name:   Mario Beck Date of Exam: 09/16/2020 Medical Rec #:  [id number]   Height:       74.0 in Accession #:    [id number]  Weight:       200.0 lb Date of Birth:  [birth year]   BSA:          2.173 m Patient Age:    39 years    BP:           166/99 mmHg Patient Gender: M           HR:           95 bpm. Exam Location:  [facility] Procedure: 2D Echo, Cardiac Doppler, Color Doppler and Intracardiac            Opacification Agent Indications:     Elevated Troponin  History:         Patient has no prior history of Echocardiogram examinations.                  Risk Factors:Hypertension, Current Smoker and Alcohol Abuse.                  Per patient- History of COVID-19.  Sonographer:     Sedonia Small Rodgers-Jones Referring Phys:  [id number] YOMAY NIU Diagnosing Phys: Adrian Blackwater MD IMPRESSIONS  1. Left ventricular ejection fraction, by estimation, is 30 to 35%. The left ventricle has moderately decreased function. The left ventricle demonstrates global hypokinesis. The left ventricular internal cavity size was severely dilated. There is mild left ventricular hypertrophy. Left ventricular diastolic parameters are consistent with Grade II diastolic dysfunction (pseudonormalization).  2. Right ventricular systolic function is moderately reduced. The right ventricular  size is moderately enlarged.  3. Left atrial size was severely dilated.  4. Right atrial size was severely dilated.  5. The mitral valve is grossly normal. Moderate mitral valve regurgitation.  6. The aortic valve is grossly normal. Aortic valve regurgitation is not visualized. Mild aortic valve sclerosis is present, with no evidence of aortic valve stenosis. FINDINGS  Left Ventricle: Left ventricular ejection fraction, by estimation, is 30 to 35%. The left ventricle has moderately decreased function. The left ventricle demonstrates global hypokinesis. Definity contrast agent was given IV to delineate the left ventricular endocardial borders. The left ventricular internal cavity size was severely dilated. There is mild left ventricular hypertrophy. Left ventricular diastolic parameters are consistent with Grade II diastolic dysfunction (pseudonormalization). Right Ventricle: The right ventricular size is moderately enlarged. No increase in right ventricular wall thickness. Right ventricular systolic function is moderately reduced. Left Atrium: Left atrial size was severely dilated. Right Atrium: Right atrial size was severely dilated. Pericardium: There is no evidence of pericardial effusion. Mitral Valve: The mitral valve is grossly normal. Moderate mitral valve regurgitation. Tricuspid Valve: The tricuspid valve is grossly normal. Tricuspid valve regurgitation is trivial. Aortic Valve: The aortic valve is grossly normal. Aortic valve regurgitation is not visualized. Mild aortic valve sclerosis is present, with no evidence of aortic valve stenosis. Pulmonic Valve: The pulmonic valve was grossly normal. Pulmonic valve regurgitation is trivial. Aorta: The aortic root was not well visualized. IAS/Shunts: No atrial level shunt detected by color flow Doppler.  LEFT VENTRICLE PLAX 2D LVIDd:         6.40 cm  Diastology LVIDs:         5.47 cm  LV e' medial:    8.27 cm/s LV PW:         0.95 cm  LV E/e' medial:  7.9 LV IVS:         0.74 cm  LV e' lateral:   6.53 cm/s LVOT diam:     2.50 cm  LV E/e' lateral: 10.0 LV SV:         64 LV SV Index:   29 LVOT Area:     4.91 cm  RIGHT VENTRICLE             IVC RV Basal diam:  4.28 cm     IVC diam: 1.57 cm RV S prime:     19.40 cm/s TAPSE (M-mode): 3.4 cm LEFT ATRIUM             Index       RIGHT ATRIUM           Index LA diam:        4.40 cm 2.02 cm/m  RA Area:     12.80 cm LA Vol (A2C):   67.1 ml 30.87 ml/m RA Volume:   27.70 ml  12.74 ml/m LA Vol (A4C):   66.3 ml 30.50 ml/m LA Biplane Vol: 67.5 ml 31.06 ml/m  AORTIC VALVE LVOT Vmax:   79.80 cm/s LVOT Vmean:  63.000 cm/s LVOT VTI:    0.130 m  AORTA Ao Root diam: 3.30 cm Ao Asc diam:  3.30 cm MITRAL VALVE               TRICUSPID VALVE MV Area (PHT): 3.99 cm    TR Peak grad:   30.5 mmHg MV Decel Time: 190 msec    TR Vmax:        276.00 cm/s MV E velocity: 65.50 cm/s MV A velocity: 62.60 cm/s  SHUNTS MV E/A ratio:  1.05        Systemic VTI:  0.13 m                            Systemic Diam: 2.50 cm Adrian Blackwater MD Electronically signed by Adrian Blackwater MD Signature Date/Time: 09/17/2020/8:27:58  AM    Final         Scheduled Meds: . amLODipine  10 mg Oral Daily  . atorvastatin  40 mg Oral Daily  . carvedilol  6.25 mg Oral BID WC  . folic acid  1 mg Oral Daily  . LORazepam  0-4 mg Intravenous Q6H   Followed by  . [START ON 09/18/2020] LORazepam  0-4 mg Intravenous Q12H  . mirtazapine  15 mg Oral QHS  . multivitamin with minerals  1 tablet Oral Daily  . nicotine  21 mg Transdermal Daily  . pantoprazole (PROTONIX) IV  40 mg Intravenous Q12H  . sertraline  100 mg Oral Q1500  . spironolactone  25 mg Oral Daily  . thiamine  100 mg Oral Daily   Or  . thiamine  100 mg Intravenous Daily   Continuous Infusions: . sodium chloride 150 mL/hr at 09/17/20 0909     LOS: 0 days    Time spent: 28 minutes    Marrion Coy, MD [facility]   To contact the attending provider between 7A-7P or the covering provider during  after hours 7P-7A, please log into the web site [email or web address] and access using universal  password for that web site. If you do not have the password, please call the hospital operator.  09/17/2020, 1:30 PM

## 2020-09-17 NOTE — Progress Notes (Signed)
Patient was found outside smoking and returned by security. Patient was educated that he could not unhook himself from his IV and leave the floor to smoke.   Madie Reno, RN

## 2020-09-18 ENCOUNTER — Other Ambulatory Visit: Payer: Self-pay | Admitting: Internal Medicine

## 2020-09-18 LAB — BASIC METABOLIC PANEL
Anion gap: 11 (ref 5–15)
BUN: 6 mg/dL (ref 6–20)
CO2: 26 mmol/L (ref 22–32)
Calcium: 9.2 mg/dL (ref 8.9–10.3)
Chloride: 104 mmol/L (ref 98–111)
Creatinine, Ser: 0.75 mg/dL (ref 0.61–1.24)
GFR, Estimated: >60 mL/min (ref >60)
Glucose, Bld: 133 mg/dL — ABNORMAL HIGH (ref 70–99)
Potassium: 3.6 mmol/L (ref 3.5–5.1)
Sodium: 141 mmol/L (ref 135–145)

## 2020-09-18 LAB — CBC
HCT: 37.1 % — ABNORMAL LOW (ref 39.0–52.0)
HCT: 38.1 % — ABNORMAL LOW (ref 39.0–52.0)
Hemoglobin: 13.1 g/dL (ref 13.0–17.0)
Hemoglobin: 13.2 g/dL (ref 13.0–17.0)
MCH: 29 pg (ref 26.0–34.0)
MCH: 28.3 pg (ref 26.0–34.0)
MCHC: 35.3 g/dL (ref 30.0–36.0)
MCHC: 34.6 g/dL (ref 30.0–36.0)
MCV: 82.1 fL (ref 80.0–100.0)
MCV: 81.8 fL (ref 80.0–100.0)
Platelets: 173 10*3/uL (ref 150–400)
Platelets: 190 10*3/uL (ref 150–400)
RBC: 4.52 MIL/uL (ref 4.22–5.81)
RBC: 4.66 MIL/uL (ref 4.22–5.81)
RDW: 12.6 % (ref 11.5–15.5)
RDW: 12.7 % (ref 11.5–15.5)
WBC: 7.8 10*3/uL (ref 4.0–10.5)
WBC: 7 10*3/uL (ref 4.0–10.5)
nRBC: 0 % (ref 0.0–0.2)
nRBC: 0 % (ref 0.0–0.2)

## 2020-09-18 LAB — MAGNESIUM: Magnesium: 2.3 mg/dL (ref 1.7–2.4)

## 2020-09-18 LAB — PHOSPHORUS: Phosphorus: 3.9 mg/dL (ref 2.5–4.6)

## 2020-09-18 MED ORDER — ATORVASTATIN CALCIUM 40 MG PO TABS
40.0000 mg | ORAL_TABLET | Freq: Every day | ORAL | 0 refills | Status: DC
Start: 2020-09-18 — End: 2021-05-28

## 2020-09-18 MED ORDER — SPIRONOLACTONE 25 MG PO TABS
25.0000 mg | ORAL_TABLET | Freq: Every day | ORAL | 0 refills | Status: DC
Start: 2020-09-19 — End: 2021-05-28

## 2020-09-18 MED ORDER — THIAMINE HCL 100 MG PO TABS: 100.0000 mg | ORAL_TABLET | Freq: Every day | ORAL | 0 refills | Status: AC

## 2020-09-18 MED ORDER — CARVEDILOL 6.25 MG PO TABS
6.2500 mg | ORAL_TABLET | Freq: Two times a day (BID) | ORAL | 0 refills | Status: DC
Start: 2020-09-18 — End: 2020-09-18

## 2020-09-18 MED ORDER — CARVEDILOL 12.5 MG PO TABS
12.5000 mg | ORAL_TABLET | Freq: Two times a day (BID) | ORAL | 0 refills | Status: DC
Start: 2020-09-18 — End: 2021-02-19

## 2020-09-18 MED ORDER — NICOTINE 21 MG/24HR TD PT24
21.0000 mg | MEDICATED_PATCH | Freq: Every day | TRANSDERMAL | 0 refills | Status: DC
Start: 2020-09-18 — End: 2021-05-28

## 2020-09-18 MED ORDER — PANTOPRAZOLE SODIUM 40 MG PO TBEC: 40.0000 mg | DELAYED_RELEASE_TABLET | Freq: Every day | ORAL | 0 refills | Status: DC

## 2020-09-18 MED ORDER — CARVEDILOL 12.5 MG PO TABS: 12.5000 mg | ORAL_TABLET | Freq: Two times a day (BID) | ORAL | Status: DC

## 2020-09-18 NOTE — Discharge Summary (Signed)
Physician Discharge Summary  Patient ID: Mario Beck MRN: [id number] DOB/AGE: [birth year] 39 y.o.  Admit date: 09/16/2020 Discharge date: 09/18/2020  Admission Diagnoses:  Discharge Diagnoses:  Principal Problem:   Alcoholic pancreatitis Active Problems:   HTN (hypertension)   Alcohol use disorder, moderate, dependence (HCC)   Transaminitis   Tobacco abuse   GERD (gastroesophageal reflux disease)   Depression   Dark stools   Elevated troponin   Chronic systolic CHF (congestive heart failure) (HCC)   Alcoholic cardiomyopathy (HCC)   Discharged Condition: good  Hospital Course:  Collie Wernick a 39 y.o.malewith medical history significant oftobacco abuse, alcohol abuse, alcoholic pancreatitis, hypertension, asthma, GERD, depression, who presents with abdominal pain. Patient lipase was not significant elevated.  He was diagnosed with alcoholic pancreatitis, was given fluids and pain medicine. Patient has minimal elevation of troponin at 26, cardiology has seen the patient.  Echocardiogram showed ejection fraction 30 to 35%.  Started on Aldactone and beta-blocker.  #1.  Acute alcoholic pancreatitis. Alcohol abuse. Patient abdominal pain seem to be improved.  Triglycerides not elevated. Patient treated with pain medicine IV fluids, he has tolerated soft diet.  At this point is medically stable to be discharged. He was also started on  CIWA protocol, no evidence of withdrawal.  Continue thiamine supplement for 2 weeks. Patient will follow by GI in 2 weeks  2.  Alcoholic cardiomyopathy. Chronic systolic congestive heart failure. Minimal elevation of troponin. Patient probably has alcoholic cardiomyopathy.  Appreciate cardiology consult.  Continue Aldactone and beta-blocker. Patient currently does not have exacerbation of congestive heart failure.   Patient has some volume depletion from pancreatitis.  Condition still stable to be discharged. Patient be followed by cardiology in  2 weeks.  3.  Dark stools. Patient hemoglobin has been stable.  Stool looks better.  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  We will continue Protonix 40 mg daily.  Follow-up with GI in 2 weeks.    Consults: cardiology  Significant Diagnostic Studies:  Echo: 1. Left ventricular ejection fraction, by estimation, is 30 to 35%. The left ventricle has moderately decreased function. The left ventricle demonstrates global hypokinesis. The left ventricular internal cavity size was severely dilated. There is mild left ventricular hypertrophy. Left ventricular diastolic parameters are consistent with Grade II diastolic dysfunction (pseudonormalization). 2. Right ventricular systolic function is moderately reduced. The right ventricular size is moderately enlarged. 3. Left atrial size was severely dilated. 4. Right atrial size was severely dilated. 5. The mitral valve is grossly normal. Moderate mitral valve regurgitation. 6. The aortic valve is grossly normal. Aortic valve regurgitation is not visualized. Mild aortic valve sclerosis is present, with no evidence of aortic valve stenosis.   Treatments: IV fluids, beta blocker, iv pain meds  Discharge Exam: Blood pressure (!) 146/97, pulse (!) 113, temperature 99 F (37.2 C), temperature source Oral, resp. rate 18, height 6\' 2"  (1.88 m), weight 90.7 kg, SpO2 100 %. General appearance: alert and cooperative Resp: clear to auscultation bilaterally Cardio: regular rate and rhythm, S1, S2 normal, no murmur, click, rub or gallop GI: soft, non-tender; bowel sounds normal; no masses,  no organomegaly Extremities: extremities normal, atraumatic, no cyanosis or edema  Disposition: Discharge disposition: 01-Home or Self Care       Discharge Instructions    Diet - low sodium heart healthy   Complete by: As directed    Increase activity slowly   Complete by: As directed      Allergies as of 09/18/2020  Reactions   Lisinopril Swelling   Angioedema   Shellfish Allergy Swelling      Medication List    TAKE these medications   amLODipine 10 MG tablet Commonly known as: NORVASC Take 1 tablet (10 mg total) by mouth daily.   atorvastatin 40 MG tablet Commonly known as: LIPITOR Take 1 tablet (40 mg total) by mouth daily.   carvedilol 6.25 MG tablet Commonly known as: COREG Take 1 tablet (6.25 mg total) by mouth 2 (two) times daily with a meal.   mirtazapine 15 MG tablet Commonly known as: Remeron Take 1 tablet (15 mg total) by mouth at bedtime.   nicotine 21 mg/24hr patch Commonly known as: NICODERM CQ - dosed in mg/24 hours Place 1 patch (21 mg total) onto the skin daily.   pantoprazole 40 MG tablet Commonly known as: Protonix Take 1 tablet (40 mg total) by mouth daily.   sertraline 100 MG tablet Commonly known as: ZOLOFT Take 1 tablet (100 mg total) by mouth daily in the afternoon.   spironolactone 25 MG tablet Commonly known as: ALDACTONE Take 1 tablet (25 mg total) by mouth daily. Start taking on: September 19, 2020   thiamine 100 MG tablet Take 1 tablet (100 mg total) by mouth daily for 14 days. Start taking on: September 19, 2020       Follow-up Information    Adrian Blackwater A, MD Follow up in 2 week(s).   Specialty: Cardiology Contact information: [address] [place] [address] [phone]        Wyline Mood, MD Follow up in 2 week(s).   Specialty: Gastroenterology Contact information: [address] [place] [address] [phone]              35 minutes  Signed: Marrion Coy 09/18/2020, 9:12 AM

## 2020-09-18 NOTE — Discharge Instructions (Signed)
Acute Pancreatitis    Acute pancreatitis happens when the pancreas gets swollen. The pancreas is a large gland in the body that helps to control blood sugar. It also makes enzymes that help to digest food.  This condition can last a few days and cause serious problems. The lungs, heart, and kidneys may stop working.  What are the causes?  Causes include:  · Alcohol abuse.  · Drug abuse.  · Gallstones.  · A tumor in the pancreas.  Other causes include:  · Some medicines.  · Some chemicals.  · Diabetes.  · An infection.  · Damage caused by an accident.  · The poison (venom) from a scorpion bite.  · Belly (abdominal) surgery.  · The body's defense system (immune system) attacking the pancreas (autoimmune pancreatitis).  · Genes that are passed from parent to child (inherited).  In some cases, the cause is not known.  What are the signs or symptoms?  · Pain in the upper belly that may be felt in the back. The pain may be very bad.  · Swelling of the belly.  · Feeling sick to your stomach (nauseous) and throwing up (vomiting).  · Fever.  How is this treated?  You will likely have to stay in the hospital. Treatment may include:  · Pain medicine.  · Fluid through an IV tube.  · Placing a tube in the stomach to take out the stomach contents. This may help you stop throwing up.  · Not eating for 3-4 days.  · Antibiotic medicines, if you have an infection.  · Treating any other problems that may be the cause.  · Steroid medicines, if your problem is caused by your defense system attacking your body's own tissues.  · Surgery.  Follow these instructions at home:  Eating and drinking    · Follow instructions from your doctor about what to eat and drink.  · Eat foods that do not have a lot of fat in them.  · Eat small meals often. Do not eat big meals.  · Drink enough fluid to keep your pee (urine) pale yellow.  · Do not drink alcohol if it caused your condition.  Medicines  · Take over-the-counter and prescription medicines only  as told by your doctor.  · Ask your doctor if the medicine prescribed to you:  ? Requires you to avoid driving or using heavy machinery.  ? Can cause trouble pooping (constipation). You may need to take steps to prevent or treat trouble pooping:  § Take over-the-counter or prescription medicines.  § Eat foods that are high in fiber. These include beans, whole grains, and fresh fruits and vegetables.  § Limit foods that are high in fat and sugar. These include fried or sweet foods.  General instructions  · Do not use any products that contain nicotine or tobacco, such as cigarettes, e-cigarettes, and chewing tobacco. If you need help quitting, ask your doctor.  · Get plenty of rest.  · Check your blood sugar at home as told by your doctor.  · Keep all follow-up visits as told by your doctor. This is important.  Contact a doctor if:  · You do not get better as quickly as expected.  · You have new symptoms.  · Your symptoms get worse.  · You have pain or weakness that lasts a long time.  · You keep feeling sick to your stomach.  · You get better and then you have pain again.  ·   You have a fever.  Get help right away if:  · You cannot eat or keep fluids down.  · Your pain gets very bad.  · Your skin or the white part of your eyes turns yellow.  · You have sudden swelling in your belly.  · You throw up.  · You feel dizzy or you pass out (faint).  · Your blood sugar is high (over 300 mg/dL).  Summary  · Acute pancreatitis happens when the pancreas gets swollen.  · This condition is often caused by alcohol abuse, drug abuse, or gallstones.  · You will likely have to stay in the hospital for treatment.  This information is not intended to replace advice given to you by your health care provider. Make sure you discuss any questions you have with your health care provider.  Document Revised: 07/23/2018 Document Reviewed: 07/23/2018  Elsevier Patient Education © 2020 [employer]

## 2020-09-18 NOTE — TOC Progression Note (Signed)
Transition of Care [facility]) - Progression Note    Patient Details  Name: Mario Beck MRN: [id number] Date of Birth: 05-09-1981  [facility] CM/SW Contact  Shawn Route, RN Phone Number: 09/18/2020, 10:23 AM  Clinical Narrative:     Patient set up with medication management previously.  Requested Dr Chipper Herb to send medications to this pharmacy.

## 2020-09-18 NOTE — Progress Notes (Signed)
   09/18/20 0855  Assess: MEWS Score  Temp 99 F (37.2 C)  BP (!) 146/97  Pulse Rate (!) 113  ECG Heart Rate (!) 113  Resp 18  Level of Consciousness Alert  SpO2 100 %  O2 Device Room Air  Assess: MEWS Score  MEWS Temp 0  MEWS Systolic 0  MEWS Pulse 2  MEWS RR 0  MEWS LOC 0  MEWS Score 2  MEWS Score Color Yellow  Assess: if the MEWS score is Yellow or Red  Were vital signs taken at a resting state? Yes  Focused Assessment Change from prior assessment (see assessment flowsheet)  Early Detection of Sepsis Score *See Row Information* Low  MEWS guidelines implemented *See Row Information* Yes  Treat  MEWS Interventions Administered scheduled meds/treatments  Pain Scale 0-10  Pain Score 8  Pain Type Acute pain  Pain Location Abdomen  Pain Orientation Right  Take Vital Signs  Increase Vital Sign Frequency  Yellow: Q 2hr X 2 then Q 4hr X 2, if remains yellow, continue Q 4hrs  Escalate  MEWS: Escalate Yellow: discuss with charge nurse/RN and consider discussing with provider and RRT  Notify: Charge Nurse/RN  Name of Charge Nurse/RN Notified Everardo All  Date Charge Nurse/RN Notified 09/18/20  Time Charge Nurse/RN Notified 0258  Notify: Provider  Provider Name/Title Marrion Coy  Date Provider Notified 09/18/20  Time Provider Notified [phone]  Notification Type  (Secure Chat)  Notification Reason Other (Comment) (MEWS )  Response No new orders  Date of Provider Response 09/18/20  Time of Provider Response [phone]

## 2020-09-18 NOTE — Progress Notes (Addendum)
   To Whom It May Concern,  Mr. Mario Beck is admitted to the hospital 12/1 to 12/3.  He may return to work after 07/22/2020.

## 2020-09-18 NOTE — Plan of Care (Signed)

## 2020-09-18 NOTE — Progress Notes (Signed)
SUBJECTIVE: Patient resting comfortably in bed. States his pain is 7/10. Denies chest pain or shortness of breath.   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   Vitals:   09/17/20 2027 09/17/20 2353 09/18/20 0312 09/18/20 0855  BP: (!) 137/104 (!) 142/100 (!) 142/104 (!) 146/97  Pulse: (!) 105 (!) 109 (!) 107 (!) 113  Resp: 16 18 18 18   Temp: 98.7 F (37.1 C) 97.9 F (36.6 C) 97.6 F (36.4 C) 99 F (37.2 C)  TempSrc: Oral Oral Oral Oral  SpO2: 100% 99% 97% 100%  Weight:      Height:        Intake/Output Summary (Last 24 hours) at 09/18/2020 1035 Last data filed at 09/18/2020 0900 Gross per 24 hour  Intake 490 ml  Output 3360 ml  Net -2870 ml    LABS: Basic Metabolic Panel: Recent Labs    09/17/20 0514 09/18/20 0532  NA 136 141  K 3.8 3.6  CL 101 104  CO2 24 26  GLUCOSE 160* 133*  BUN 7 6  CREATININE 0.75 0.75  CALCIUM 9.1 9.2  MG  --  2.3  PHOS  --  3.9   Liver Function Tests: Recent Labs    09/16/20 1043 09/17/20 0514  AST 61* 28  ALT 47* 32  ALKPHOS 75 66  BILITOT 1.2 1.0  PROT 8.0 7.1  ALBUMIN 4.5 4.0   Recent Labs    09/16/20 1043 09/17/20 0514  LIPASE 126* 123*   CBC: Recent Labs    09/18/20 0242 09/18/20 0530  WBC 7.8 7.0  HGB 13.1 13.2  HCT 37.1* 38.1*  MCV 82.1 81.8  PLT 173 190   Cardiac Enzymes: No results for input(s): CKTOTAL, CKMB, CKMBINDEX, TROPONINI in the last 72 hours. BNP: Invalid input(s): POCBNP D-Dimer: No results for input(s): DDIMER in the last 72 hours. Hemoglobin A1C: Recent Labs    09/17/20 0514  HGBA1C 6.0*   Fasting Lipid Panel: Recent Labs    09/17/20 0514  CHOL 232*  HDL 134  LDLCALC 65  TRIG 14/02/21*  CHOLHDL 1.7   Thyroid Function Tests: No results for input(s): TSH, T4TOTAL, T3FREE, THYROIDAB in the last 72 hours.  Invalid input(s): FREET3 Anemia Panel: No results for input(s): VITAMINB12, FOLATE, FERRITIN, TIBC, IRON, RETICCTPCT in the last 72  hours.   PHYSICAL EXAM General: Well developed, well nourished, in no acute distress HEENT:  Normocephalic and atramatic Neck:  No JVD.  Lungs: Clear bilaterally to auscultation and percussion. Heart: HRRR . Normal S1 and S2 without gallops or murmurs.  Abdomen: Bowel sounds are positive, abdomen soft and non-tender  Msk:  Back normal, normal gait. Normal strength and tone for age. Extremities: No clubbing, cyanosis or edema.   Neuro: Alert and oriented X 3. Psych:  Good affect, responds appropriately  TELEMETRY: ST. 106  ASSESSMENT AND PLAN: Patient presenting to the emergency department with acute abdominal pain with N/V. 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 Will increase carvedilol as remains tachycardic and continue spironolactone and further management can be completed as an outpatient. With abnormal EKG patient will have further CAD work up will be completed once discharged. From a cardiac point of view patient is stable for discharge. Please have the patient come to our clinic within the next week for further management.  Principal Problem:   Alcoholic pancreatitis Active Problems:   HTN (hypertension)   Alcohol use disorder, moderate, dependence (HCC)   Transaminitis   Tobacco abuse   GERD (  gastroesophageal reflux disease)   Depression   Dark stools   Elevated troponin   Chronic systolic CHF (congestive heart failure) (HCC)   Alcoholic cardiomyopathy (HCC)    Chart review, assessment, plan development time spent 31 minutes.  Maryelizabeth Kaufmann, NP-C 09/18/2020 10:35 AM

## 2020-09-18 NOTE — Plan of Care (Addendum)
Pt Axox4. Independent. C/o of abdominal pain with pain meds adm per Dr orders. Pt tolerates clear liquid diet. No report of nausea nor vomiting. On CIWA protocol. Safety measures in place. Will continue to monitor. Problem: Education: Goal: Knowledge of General Education information will improve Description: Including pain rating scale, medication(s)/side effects and non-pharmacologic comfort measures Outcome: Progressing   Problem: Health Behavior/Discharge Planning: Goal: Ability to manage health-related needs will improve Outcome: Progressing   Problem: Clinical Measurements: Goal: Ability to maintain clinical measurements within normal limits will improve Outcome: Progressing Goal: Will remain free from infection Outcome: Progressing Goal: Diagnostic test results will improve Outcome: Progressing Goal: Respiratory complications will improve Outcome: Progressing Goal: Cardiovascular complication will be avoided Outcome: Progressing   Problem: Activity: Goal: Risk for activity intolerance will decrease Outcome: Progressing   Problem: Nutrition: Goal: Adequate nutrition will be maintained Outcome: Progressing   Problem: Coping: Goal: Level of anxiety will decrease Outcome: Progressing   Problem: Elimination: Goal: Will not experience complications related to bowel motility Outcome: Progressing Goal: Will not experience complications related to urinary retention Outcome: Progressing   Problem: Pain Managment: Goal: General experience of comfort will improve Outcome: Progressing   Problem: Safety: Goal: Ability to remain free from injury will improve Outcome: Progressing   Problem: Skin Integrity: Goal: Risk for impaired skin integrity will decrease Outcome: Progressing

## 2020-09-18 NOTE — Progress Notes (Signed)
Pt has received discharge instructions at this time. Pt verbalize understanding of instructions and has no difficulty picking up prescribed medications. PIV removed from (R) hand without difficulty - catheter intact. Pt off unit with girlfriend in no acute distress.

## 2020-09-19 ENCOUNTER — Other Ambulatory Visit: Payer: Self-pay

## 2020-09-19 ENCOUNTER — Emergency Department: Payer: Self-pay

## 2020-09-19 ENCOUNTER — Emergency Department
Admission: EM | Admit: 2020-09-19 | Discharge: 2020-09-19 | Disposition: A | Discharge status: Home / Self Care | Payer: Self-pay | Attending: Emergency Medicine | Admitting: Emergency Medicine

## 2020-09-19 ENCOUNTER — Emergency Department: Admission: EM | Admit: 2020-09-19 | Discharge: 2020-09-19 | Discharge status: Against Medical Advice | Payer: Self-pay

## 2020-09-19 DIAGNOSIS — R079 Chest pain, unspecified: Secondary | ICD-10-CM | POA: Insufficient documentation

## 2020-09-19 DIAGNOSIS — Z5321 Procedure and treatment not carried out due to patient leaving prior to being seen by health care provider: Secondary | ICD-10-CM | POA: Insufficient documentation

## 2020-09-19 LAB — CBC
HCT: 36.2 % — ABNORMAL LOW (ref 39.0–52.0)
Hemoglobin: 12.5 g/dL — ABNORMAL LOW (ref 13.0–17.0)
MCH: 28.5 pg (ref 26.0–34.0)
MCHC: 34.5 g/dL (ref 30.0–36.0)
MCV: 82.6 fL (ref 80.0–100.0)
Platelets: 183 10*3/uL (ref 150–400)
RBC: 4.38 MIL/uL (ref 4.22–5.81)
RDW: 12.5 % (ref 11.5–15.5)
WBC: 7.6 10*3/uL (ref 4.0–10.5)
nRBC: 0 % (ref 0.0–0.2)

## 2020-09-19 LAB — BASIC METABOLIC PANEL
Anion gap: 10 (ref 5–15)
BUN: 14 mg/dL (ref 6–20)
CO2: 23 mmol/L (ref 22–32)
Calcium: 9.1 mg/dL (ref 8.9–10.3)
Chloride: 104 mmol/L (ref 98–111)
Creatinine, Ser: 1.06 mg/dL (ref 0.61–1.24)
GFR, Estimated: >60 mL/min (ref >60)
Glucose, Bld: 166 mg/dL — ABNORMAL HIGH (ref 70–99)
Potassium: 4.2 mmol/L (ref 3.5–5.1)
Sodium: 137 mmol/L (ref 135–145)

## 2020-09-19 LAB — TROPONIN I (HIGH SENSITIVITY): Troponin I (High Sensitivity): 24 ng/L — ABNORMAL HIGH (ref <18)

## 2020-09-19 NOTE — ED Notes (Signed)
Patient just left the emergency room waiting area and went home. Returns at this time via EMS stating he tried to go home and medicate but it "just got worse." Returns with same complaint of chest pain.

## 2020-09-19 NOTE — ED Triage Notes (Signed)
Pt arrived via POV with reports of chest pain that started yesterday after he was discharged from hospital. Pt was admitted with pancreatitis states this pain is more in his chest and feels different. Pt states the pain radiates to his back.  Pt states he did not get any medications for pain when he was discharged.

## 2020-10-23 ENCOUNTER — Emergency Department
Admission: EM | Admit: 2020-10-23 | Discharge: 2020-10-23 | Disposition: A | Discharge status: Home / Self Care | Payer: Self-pay | Attending: Emergency Medicine | Admitting: Emergency Medicine

## 2020-10-23 ENCOUNTER — Other Ambulatory Visit: Payer: Self-pay

## 2020-10-23 ENCOUNTER — Emergency Department: Payer: Self-pay

## 2020-10-23 ENCOUNTER — Encounter: Payer: Self-pay | Admitting: Emergency Medicine

## 2020-10-23 DIAGNOSIS — Z20822 Contact with and (suspected) exposure to covid-19: Secondary | ICD-10-CM | POA: Insufficient documentation

## 2020-10-23 DIAGNOSIS — I5022 Chronic systolic (congestive) heart failure: Secondary | ICD-10-CM | POA: Insufficient documentation

## 2020-10-23 DIAGNOSIS — R531 Weakness: Secondary | ICD-10-CM | POA: Insufficient documentation

## 2020-10-23 DIAGNOSIS — F17220 Nicotine dependence, chewing tobacco, uncomplicated: Secondary | ICD-10-CM | POA: Insufficient documentation

## 2020-10-23 DIAGNOSIS — J45909 Unspecified asthma, uncomplicated: Secondary | ICD-10-CM | POA: Insufficient documentation

## 2020-10-23 DIAGNOSIS — R6883 Chills (without fever): Secondary | ICD-10-CM | POA: Insufficient documentation

## 2020-10-23 DIAGNOSIS — I110 Hypertensive heart disease with heart failure: Secondary | ICD-10-CM | POA: Insufficient documentation

## 2020-10-23 DIAGNOSIS — F17210 Nicotine dependence, cigarettes, uncomplicated: Secondary | ICD-10-CM | POA: Insufficient documentation

## 2020-10-23 DIAGNOSIS — R110 Nausea: Secondary | ICD-10-CM | POA: Insufficient documentation

## 2020-10-23 DIAGNOSIS — Z79899 Other long term (current) drug therapy: Secondary | ICD-10-CM | POA: Insufficient documentation

## 2020-10-23 LAB — COMPREHENSIVE METABOLIC PANEL WITH GFR
ALT: 68 U/L — ABNORMAL HIGH (ref 0–44)
AST: 67 U/L — ABNORMAL HIGH (ref 15–41)
Albumin: 3.8 g/dL (ref 3.5–5.0)
Alkaline Phosphatase: 94 U/L (ref 38–126)
Anion gap: 14 (ref 5–15)
BUN: 12 mg/dL (ref 6–20)
CO2: 22 mmol/L (ref 22–32)
Calcium: 9.1 mg/dL (ref 8.9–10.3)
Chloride: 105 mmol/L (ref 98–111)
Creatinine, Ser: 0.87 mg/dL (ref 0.61–1.24)
GFR, Estimated: >60 mL/min
Glucose, Bld: 106 mg/dL — ABNORMAL HIGH (ref 70–99)
Potassium: 3.9 mmol/L (ref 3.5–5.1)
Sodium: 141 mmol/L (ref 135–145)
Total Bilirubin: 1 mg/dL (ref 0.3–1.2)
Total Protein: 7.3 g/dL (ref 6.5–8.1)

## 2020-10-23 LAB — CBC WITH DIFFERENTIAL/PLATELET
Abs Immature Granulocytes: 0 K/uL (ref 0.00–0.07)
Basophils Absolute: 0 K/uL (ref 0.0–0.1)
Basophils Relative: 1 %
Eosinophils Absolute: 0.1 K/uL (ref 0.0–0.5)
Eosinophils Relative: 1 %
HCT: 45 % (ref 39.0–52.0)
Hemoglobin: 15.5 g/dL (ref 13.0–17.0)
Immature Granulocytes: 0 %
Lymphocytes Relative: 27 %
Lymphs Abs: 1.5 K/uL (ref 0.7–4.0)
MCH: 28.1 pg (ref 26.0–34.0)
MCHC: 34.4 g/dL (ref 30.0–36.0)
MCV: 81.5 fL (ref 80.0–100.0)
Monocytes Absolute: 0.5 K/uL (ref 0.1–1.0)
Monocytes Relative: 9 %
Neutro Abs: 3.4 K/uL (ref 1.7–7.7)
Neutrophils Relative %: 62 %
Platelets: 195 K/uL (ref 150–400)
RBC: 5.52 MIL/uL (ref 4.22–5.81)
RDW: 13.7 % (ref 11.5–15.5)
WBC: 5.4 K/uL (ref 4.0–10.5)
nRBC: 0 % (ref 0.0–0.2)

## 2020-10-23 LAB — LIPASE, BLOOD: Lipase: 25 U/L (ref 11–51)

## 2020-10-23 LAB — RESP PANEL BY RT-PCR (FLU A&B, COVID) ARPGX2
Influenza A by PCR: NEGATIVE
Influenza B by PCR: NEGATIVE
SARS Coronavirus 2 by RT PCR: NEGATIVE

## 2020-10-23 LAB — TROPONIN I (HIGH SENSITIVITY)
Troponin I (High Sensitivity): 19 ng/L — ABNORMAL HIGH
Troponin I (High Sensitivity): 18 ng/L — ABNORMAL HIGH (ref <18)

## 2020-10-23 MED ORDER — PROMETHAZINE HCL 25 MG/ML IJ SOLN
12.5000 mg | Freq: Once | INTRAMUSCULAR | Status: AC
  Administered 2020-10-23: 12.5 mg via INTRAVENOUS
  Filled 2020-10-23: qty 1

## 2020-10-23 MED ORDER — ONDANSETRON 4 MG PO TBDP
4.0000 mg | ORAL_TABLET | Freq: Once | ORAL | Status: AC
  Administered 2020-10-23: 4 mg via ORAL
  Filled 2020-10-23: qty 1

## 2020-10-23 MED ORDER — ONDANSETRON 4 MG PO TBDP: 4.0000 mg | ORAL_TABLET | Freq: Three times a day (TID) | ORAL | 0 refills | Status: AC | PRN

## 2020-10-23 MED ORDER — LORAZEPAM 0.5 MG PO TABS
0.5000 mg | ORAL_TABLET | Freq: Once | ORAL | Status: AC
  Administered 2020-10-23: 0.5 mg via ORAL
  Filled 2020-10-23: qty 1

## 2020-10-23 MED ORDER — SODIUM CHLORIDE 0.9 % IV BOLUS
1000.0000 mL | Freq: Once | INTRAVENOUS | Status: AC
  Administered 2020-10-23: 1000 mL via INTRAVENOUS

## 2020-10-23 NOTE — Discharge Instructions (Signed)
You can take Zofran as needed for nausea.

## 2020-10-23 NOTE — ED Notes (Addendum)
Patient informed this RN that he feels as though he is going into Etoh withdrawal with unrelated symptoms to initial symptoms that patient came in with. Patient states he "drinks about a half pint a day".  Lockie Pares, [place] informed, new orders placed.

## 2020-10-23 NOTE — ED Triage Notes (Signed)
Pt to ED via POV with c/o weakness x 3-4 days. Pt also c/o nausea and increasing fatigue x 3-4 days, states today was at work and he became very sweaty. Pt A&O x4, ambulatory to triage room at this time.

## 2020-10-23 NOTE — ED Provider Notes (Signed)
[facility]-EMERGENCY DEPARTMENT  ____________________________________________  Time seen: Approximately 9:28 PM  I have reviewed the triage vital signs and the nursing notes.   HISTORY  Chief Complaint Weakness   Historian Patient     HPI Mario Beck is a 40 y.o. male presents to the emergency department with concern for feeling ill for the past 3 to 4 days.  Patient has a history for alcohol abuse, asthma and hypertension.  Patient states that he has had chills and some nausea.  He states that his current symptoms feel different than instances of pancreatitis that he has had in the past.  He denies chest pain, chest tightness or shortness of breath.  He has had some diarrhea.  No sick contacts in the home with pancreatitis to his knowledge.   Past Medical History:  Diagnosis Date  . Alcohol abuse   . Asthma   . Depression   . Hypertension      Immunizations up to date:  Yes.     Past Medical History:  Diagnosis Date  . Alcohol abuse   . Asthma   . Depression   . Hypertension     Patient Active Problem List   Diagnosis Date Noted  . Chronic systolic CHF (congestive heart failure) (HCC) 09/17/2020  . Alcoholic cardiomyopathy (HCC) 33/82/5053  . Alcoholic pancreatitis 09/16/2020  . Tobacco abuse 09/16/2020  . Abnormal EKG 09/16/2020  . GERD (gastroesophageal reflux disease) 09/16/2020  . Depression 09/16/2020  . Dark stools 09/16/2020  . Elevated troponin 09/16/2020  . Transaminitis 03/31/2020  . Nausea and vomiting 02/21/2020  . Intractable nausea and vomiting 02/21/2020  . Tobacco abuse counseling   . Acute alcoholic pancreatitis 02/18/2020  . HTN (hypertension) 02/18/2020  . Alcohol use disorder, moderate, dependence (HCC) 02/18/2020  . Acute pancreatitis 02/18/2020    Past Surgical History:  Procedure Laterality Date  . FRACTURE SURGERY     Left leg as a kid    Prior to Admission medications   Medication Sig Start Date End Date Taking?  Authorizing Provider  ondansetron (ZOFRAN ODT) 4 MG disintegrating tablet Take 1 tablet (4 mg total) by mouth every 8 (eight) hours as needed for up to 5 days. 10/23/20 10/28/20 Yes Pia Mau M, PA-C  amLODipine (NORVASC) 10 MG tablet Take 1 tablet (10 mg total) by mouth daily. 04/03/20 09/16/20  Charise Killian, MD  atorvastatin (LIPITOR) 40 MG tablet Take 1 tablet (40 mg total) by mouth daily. 09/18/20   Marrion Coy, MD  carvedilol (COREG) 12.5 MG tablet Take 1 tablet (12.5 mg total) by mouth 2 (two) times daily with a meal. 09/18/20   Marrion Coy, MD  mirtazapine (REMERON) 15 MG tablet Take 1 tablet (15 mg total) by mouth at bedtime. 02/23/20   Pennie Banter, DO  nicotine (NICODERM CQ - DOSED IN MG/24 HOURS) 21 mg/24hr patch Place 1 patch (21 mg total) onto the skin daily. 09/18/20   Marrion Coy, MD  pantoprazole (PROTONIX) 40 MG tablet Take 1 tablet (40 mg total) by mouth daily. 09/18/20 10/18/20  Marrion Coy, MD  sertraline (ZOLOFT) 100 MG tablet Take 1 tablet (100 mg total) by mouth daily in the afternoon. 04/03/20 05/03/20  Charise Killian, MD  spironolactone (ALDACTONE) 25 MG tablet Take 1 tablet (25 mg total) by mouth daily. 09/19/20   Marrion Coy, MD    Allergies Lisinopril and Shellfish allergy  Family History  Problem Relation Age of Onset  . Colon cancer Neg Hx   . Esophageal cancer  Neg Hx     Social History Social History   Tobacco Use  . Smoking status: Current Every Day Smoker    Packs/day: 1.00    Types: Cigarettes  . Smokeless tobacco: Current User    Types: Snuff  . Tobacco comment: Does pouches as welll   Vaping Use  . Vaping Use: Never used  Substance Use Topics  . Alcohol use: Yes    Alcohol/week: 7.0 standard drinks    Types: 7 Cans of beer per week  . Drug use: No     Review of Systems  Constitutional: No fever/chills Eyes:  No discharge ENT: No upper respiratory complaints. Respiratory: no cough. No SOB/ use of accessory muscles to  breath Gastrointestinal: Patient has nausea.  Musculoskeletal: Negative for musculoskeletal pain. Skin: Negative for rash, abrasions, lacerations, ecchymosis.    ____________________________________________   PHYSICAL EXAM:  VITAL SIGNS: ED Triage Vitals [10/23/20 1720]  Enc Vitals Group     BP (!) 124/103     Pulse Rate 100     Resp 20     Temp 97.9 F (36.6 C)     Temp Source Oral     SpO2 99 %     Weight 190 lb (86.2 kg)     Height 6\' 3"  (1.905 m)     Head Circumference      Peak Flow      Pain Score 0     Pain Loc      Pain Edu?      Excl. in [place]?      Constitutional: Alert and oriented. Well appearing and in no acute distress. Eyes: Conjunctivae are normal. PERRL. EOMI. Head: Atraumatic. ENT:      Ears: TMs are pearly.       Nose: No congestion/rhinnorhea.      Mouth/Throat: Mucous membranes are moist.  Neck: No stridor.  No cervical spine tenderness to palpation. Cardiovascular: Normal rate, regular rhythm. Normal S1 and S2.  Good peripheral circulation. Respiratory: Normal respiratory effort without tachypnea or retractions. Lungs CTAB. Good air entry to the bases with no decreased or absent breath sounds Gastrointestinal: Bowel sounds x 4 quadrants. Soft and nontender to palpation. No guarding or rigidity. No distention. Musculoskeletal: Full range of motion to all extremities. No obvious deformities noted Neurologic:  Normal for age. No gross focal neurologic deficits are appreciated.  Skin:  Skin is warm, dry and intact. No rash noted. Psychiatric: Mood and affect are normal for age. Speech and behavior are normal.   ____________________________________________   LABS (all labs ordered are listed, but only abnormal results are displayed)  Labs Reviewed  COMPREHENSIVE METABOLIC PANEL - Abnormal; Notable for the following components:      Result Value   Glucose, Bld 106 (*)    AST 67 (*)    ALT 68 (*)    All other components within normal limits   TROPONIN I (HIGH SENSITIVITY) - Abnormal; Notable for the following components:   Troponin I (High Sensitivity) 19 (*)    All other components within normal limits  TROPONIN I (HIGH SENSITIVITY) - Abnormal; Notable for the following components:   Troponin I (High Sensitivity) 18 (*)    All other components within normal limits  RESP PANEL BY RT-PCR (FLU A&B, COVID) ARPGX2  CBC WITH DIFFERENTIAL/PLATELET  LIPASE, BLOOD   ____________________________________________  EKG   ____________________________________________  RADIOLOGY {I, Lannie Fields, personally viewed and evaluated these images (plain radiographs) as part of my medical decision making, as  well as reviewing the written report by the radiologist.    DG Chest 1 View  Result Date: 10/23/2020 CLINICAL DATA:  Cough.  Weakness 3-4 days. EXAM: CHEST  1 VIEW COMPARISON:  Chest x-ray 09/19/2020 FINDINGS: The heart size and mediastinal contours are unchanged. No focal consolidation. No pulmonary edema. No pleural effusion. No pneumothorax. No acute osseous abnormality. IMPRESSION: No active disease. Electronically Signed   By: Tish Frederickson M.D.   On: 10/23/2020 21:39    ____________________________________________    PROCEDURES  Procedure(s) performed:     Procedures     Medications  promethazine (PHENERGAN) injection 12.5 mg (has no administration in time range)  ondansetron (ZOFRAN-ODT) disintegrating tablet 4 mg (4 mg Oral Given 10/23/20 2144)  sodium chloride 0.9 % bolus 1,000 mL (1,000 mLs Intravenous New Bag/Given 10/23/20 2144)  LORazepam (ATIVAN) tablet 0.5 mg (0.5 mg Oral Given 10/23/20 2159)     ____________________________________________   INITIAL IMPRESSION / ASSESSMENT AND PLAN / ED COURSE  Pertinent labs & imaging results that were available during my care of the patient were reviewed by me and considered in my medical decision making (see chart for details).      Assessment and  Plan: Nausea Malaise Body aches 40 year old male presents to the emergency department with nausea, malaise and body aches. Patient reports that he is attempting to stop drinking alcohol and some of his symptoms may be due to alcohol withdrawal.  Patient was mildly tachycardic at triage but vital signs otherwise reassuring. Troponin trended down in the emergency department. Lipase was within reference range. COVID-19 testing was negative. AST and ALT were mildly elevated but were otherwise reassuring. CBC was reassuring.  Patient was discharged with Zofran for nausea. All patient questions were answered.    ____________________________________________  FINAL CLINICAL IMPRESSION(S) / ED DIAGNOSES  Final diagnoses:  Nausea      NEW MEDICATIONS STARTED DURING THIS VISIT:  ED Discharge Orders         Ordered    ondansetron (ZOFRAN ODT) 4 MG disintegrating tablet  Every 8 hours PRN        10/23/20 2319              This chart was dictated using voice recognition software/Dragon. Despite best efforts to proofread, errors can occur which can change the meaning. Any change was purely unintentional.     Orvil Feil, PA-C 10/23/20 2327    Phineas Semen, MD 10/24/20 [phone]

## 2020-11-29 ENCOUNTER — Emergency Department: Payer: Self-pay

## 2020-11-29 ENCOUNTER — Other Ambulatory Visit: Payer: Self-pay

## 2020-11-29 ENCOUNTER — Emergency Department
Admission: EM | Admit: 2020-11-29 | Discharge: 2020-11-29 | Disposition: A | Discharge status: Home / Self Care | Payer: Self-pay | Attending: Emergency Medicine | Admitting: Emergency Medicine

## 2020-11-29 DIAGNOSIS — W228XXA Striking against or struck by other objects, initial encounter: Secondary | ICD-10-CM | POA: Insufficient documentation

## 2020-11-29 DIAGNOSIS — I5022 Chronic systolic (congestive) heart failure: Secondary | ICD-10-CM | POA: Insufficient documentation

## 2020-11-29 DIAGNOSIS — I110 Hypertensive heart disease with heart failure: Secondary | ICD-10-CM | POA: Insufficient documentation

## 2020-11-29 DIAGNOSIS — J45909 Unspecified asthma, uncomplicated: Secondary | ICD-10-CM | POA: Insufficient documentation

## 2020-11-29 DIAGNOSIS — S60211A Contusion of right wrist, initial encounter: Secondary | ICD-10-CM | POA: Insufficient documentation

## 2020-11-29 DIAGNOSIS — Z79899 Other long term (current) drug therapy: Secondary | ICD-10-CM | POA: Insufficient documentation

## 2020-11-29 DIAGNOSIS — F17210 Nicotine dependence, cigarettes, uncomplicated: Secondary | ICD-10-CM | POA: Insufficient documentation

## 2020-11-29 MED ORDER — HYDROCODONE-ACETAMINOPHEN 5-325 MG PO TABS
1.0000 | ORAL_TABLET | Freq: Once | ORAL | Status: AC
  Administered 2020-11-29: 1 via ORAL
  Filled 2020-11-29: qty 1

## 2020-11-29 MED ORDER — HYDROCODONE-ACETAMINOPHEN 5-325 MG PO TABS: 1.0000 | ORAL_TABLET | Freq: Four times a day (QID) | ORAL | 0 refills | Status: DC | PRN

## 2020-11-29 MED ORDER — IBUPROFEN 800 MG PO TABS
800.0000 mg | ORAL_TABLET | Freq: Once | ORAL | Status: AC
  Administered 2020-11-29: 800 mg via ORAL
  Filled 2020-11-29: qty 1

## 2020-11-29 MED ORDER — IBUPROFEN 800 MG PO TABS: 800.0000 mg | ORAL_TABLET | Freq: Three times a day (TID) | ORAL | 0 refills | Status: DC | PRN

## 2020-11-29 NOTE — ED Provider Notes (Signed)
[facility] Emergency Department Provider Note   ____________________________________________   Event Date/Time   First MD Initiated Contact with Patient 11/29/20 [phone]     (approximate)  I have reviewed the triage vital signs and the nursing notes.   HISTORY  Chief Complaint Wrist Injury    HPI Mario Beck is a 40 y.o. male who presents to the ED from home with a chief complaint of right wrist pain.  Patient states around 11 PM he was angry and struck the bed, accidentally striking his right, dominant wrist.  Presents with pain and swelling to dorsal wrist.  Voices no other complaints or injuries     Past Medical History:  Diagnosis Date  . Alcohol abuse   . Asthma   . Depression   . Hypertension     Patient Active Problem List   Diagnosis Date Noted  . Chronic systolic CHF (congestive heart failure) (HCC) 09/17/2020  . Alcoholic cardiomyopathy (HCC) 79/89/2119  . Alcoholic pancreatitis 09/16/2020  . Tobacco abuse 09/16/2020  . Abnormal EKG 09/16/2020  . GERD (gastroesophageal reflux disease) 09/16/2020  . Depression 09/16/2020  . Dark stools 09/16/2020  . Elevated troponin 09/16/2020  . Transaminitis 03/31/2020  . Nausea and vomiting 02/21/2020  . Intractable nausea and vomiting 02/21/2020  . Tobacco abuse counseling   . Acute alcoholic pancreatitis 02/18/2020  . HTN (hypertension) 02/18/2020  . Alcohol use disorder, moderate, dependence (HCC) 02/18/2020  . Acute pancreatitis 02/18/2020    Past Surgical History:  Procedure Laterality Date  . FRACTURE SURGERY     Left leg as a kid    Prior to Admission medications   Medication Sig Start Date End Date Taking? Authorizing Provider  HYDROcodone-acetaminophen (NORCO) 5-325 MG tablet Take 1 tablet by mouth every 6 (six) hours as needed for moderate pain. 11/29/20  Yes Irean Hong, MD  ibuprofen (ADVIL) 800 MG tablet Take 1 tablet (800 mg total) by mouth every 8 (eight) hours as  needed for moderate pain. 11/29/20  Yes Irean Hong, MD  amLODipine (NORVASC) 10 MG tablet Take 1 tablet (10 mg total) by mouth daily. 04/03/20 09/16/20  Charise Killian, MD  atorvastatin (LIPITOR) 40 MG tablet Take 1 tablet (40 mg total) by mouth daily. 09/18/20   Marrion Coy, MD  carvedilol (COREG) 12.5 MG tablet Take 1 tablet (12.5 mg total) by mouth 2 (two) times daily with a meal. 09/18/20   Marrion Coy, MD  mirtazapine (REMERON) 15 MG tablet Take 1 tablet (15 mg total) by mouth at bedtime. 02/23/20   Pennie Banter, DO  nicotine (NICODERM CQ - DOSED IN MG/24 HOURS) 21 mg/24hr patch Place 1 patch (21 mg total) onto the skin daily. 09/18/20   Marrion Coy, MD  pantoprazole (PROTONIX) 40 MG tablet Take 1 tablet (40 mg total) by mouth daily. 09/18/20 10/18/20  Marrion Coy, MD  sertraline (ZOLOFT) 100 MG tablet Take 1 tablet (100 mg total) by mouth daily in the afternoon. 04/03/20 05/03/20  Charise Killian, MD  spironolactone (ALDACTONE) 25 MG tablet Take 1 tablet (25 mg total) by mouth daily. 09/19/20   Marrion Coy, MD    Allergies Lisinopril and Shellfish allergy  Family History  Problem Relation Age of Onset  . Colon cancer Neg Hx   . Esophageal cancer Neg Hx     Social History Social History   Tobacco Use  . Smoking status: Current Every Day Smoker    Packs/day: 1.00    Types: Cigarettes  .  Smokeless tobacco: Current User    Types: Snuff  . Tobacco comment: Does pouches as welll   Vaping Use  . Vaping Use: Never used  Substance Use Topics  . Alcohol use: Yes    Alcohol/week: 7.0 standard drinks    Types: 7 Cans of beer per week  . Drug use: No    Review of Systems  Constitutional: No fever/chills Eyes: No visual changes. ENT: No sore throat. Cardiovascular: Denies chest pain. Respiratory: Denies shortness of breath. Gastrointestinal: No abdominal pain.  No nausea, no vomiting.  No diarrhea.  No constipation. Genitourinary: Negative for dysuria. Musculoskeletal:  Positive for right wrist pain.  Negative for back pain. Skin: Negative for rash. Neurological: Negative for headaches, focal weakness or numbness.   ____________________________________________   PHYSICAL EXAM:  VITAL SIGNS: ED Triage Vitals  Enc Vitals Group     BP 11/29/20 0314 (!) 141/103     Pulse Rate 11/29/20 0314 (!) 118     Resp 11/29/20 0314 16     Temp 11/29/20 0314 98.3 F (36.8 C)     Temp Source 11/29/20 0314 Oral     SpO2 11/29/20 0314 97 %     Weight 11/29/20 0316 190 lb (86.2 kg)     Height 11/29/20 0316 6\' 3"  (1.905 m)     Head Circumference --      Peak Flow --      Pain Score 11/29/20 0315 9     Pain Loc --      Pain Edu? --      Excl. in GC? --     Constitutional: Alert and oriented. Well appearing and in no acute distress. Eyes: Conjunctivae are normal. PERRL. EOMI. Head: Atraumatic. Nose: Atraumatic. Mouth/Throat: Mucous membranes are moist.  No dental malocclusion.   Neck: No stridor.  No cervical spine tenderness to palpation. Cardiovascular: Normal rate, regular rhythm. Grossly normal heart sounds.  Good peripheral circulation. Respiratory: Normal respiratory effort.  No retractions. Lungs CTAB. Gastrointestinal: Soft and nontender. No distention. No abdominal bruits. No CVA tenderness. Musculoskeletal:  Right dorsal wrist tender to palpation with limited range of motion secondary to pain.  No visible swelling.  2+ radial pulses.  Brisk, less than 5-second capillary refill. No lower extremity tenderness nor edema.  No joint effusions. Neurologic:  Normal speech and language. No gross focal neurologic deficits are appreciated. No gait instability. Skin:  Skin is warm, dry and intact. No rash noted. Psychiatric: Mood and affect are normal. Speech and behavior are normal.  ____________________________________________   LABS (all labs ordered are listed, but only abnormal results are displayed)  Labs Reviewed - No data to  display ____________________________________________  EKG  None ____________________________________________  RADIOLOGY I, Jakavion Bilodeau J, personally viewed and evaluated these images (plain radiographs) as part of my medical decision making, as well as reviewing the written report by the radiologist.  ED MD interpretation: No acute fracture or dislocation  Official radiology report(s): DG Wrist Complete Right  Result Date: 11/29/2020 CLINICAL DATA:  Right wrist pain after injury. EXAM: RIGHT WRIST - COMPLETE 3+ VIEW COMPARISON:  None. FINDINGS: There is no evidence of acute fracture or dislocation. Remote healed distal fifth metacarpal fracture. There is no evidence of arthropathy or other focal bone abnormality. Soft tissues are unremarkable. IMPRESSION: No acute fracture or subluxation of the right wrist. Electronically Signed   By: 12/01/2020 M.D.   On: 11/29/2020 03:45    ____________________________________________   PROCEDURES  Procedure(s) performed (including Critical Care):  Procedures   ____________________________________________   INITIAL IMPRESSION / ASSESSMENT AND PLAN / ED COURSE  As part of my medical decision making, I reviewed the following data within the electronic MEDICAL RECORD NUMBER Nursing notes reviewed and incorporated, Old chart reviewed, Radiograph reviewed, Notes from prior ED visits and [place] Controlled Substance Database     40 year old male presenting with right wrist contusion.  Will treat with NSAID, Norco, cock-up wrist splint and follow-up with orthopedics as needed.  Strict return precautions given.  Patient verbalizes understanding agrees with plan of care.      ____________________________________________   FINAL CLINICAL IMPRESSION(S) / ED DIAGNOSES  Final diagnoses:  Contusion of right wrist, initial encounter     ED Discharge Orders         Ordered    ibuprofen (ADVIL) 800 MG tablet  Every 8 hours PRN        11/29/20 0355     HYDROcodone-acetaminophen (NORCO) 5-325 MG tablet  Every 6 hours PRN        11/29/20 0355          *Please note:  Mario Beck was evaluated in Emergency Department on 11/29/2020 for the symptoms described in the history of present illness. He was evaluated in the context of the global COVID-19 pandemic, which necessitated consideration that the patient might be at risk for infection with the SARS-CoV-2 virus that causes COVID-19. Institutional protocols and algorithms that pertain to the evaluation of patients at risk for COVID-19 are in a state of rapid change based on information released by regulatory bodies including the CDC and federal and state organizations. These policies and algorithms were followed during the patient's care in the ED.  Some ED evaluations and interventions may be delayed as a result of limited staffing during and the pandemic.*   Note:  This document was prepared using Dragon voice recognition software and may include unintentional dictation errors.   Irean Hong, MD 11/29/20 [phone]

## 2020-11-29 NOTE — ED Triage Notes (Signed)
Patient ambulatory to triage with complaints of right wrist pain since 2300 when pt reports getting mad and striking a bed.  Pt with pain to right wrist and swelling to same area

## 2020-11-29 NOTE — Discharge Instructions (Signed)
1.  You may take pain medicines as needed (Motrin/Norco #15). 2.  Apply ice to affected area several times daily. 3.  You may remove splint as needed. 4.  Return to the ER for worsening symptoms, persistent vomiting, difficulty breathing or other concerns.

## 2020-12-11 ENCOUNTER — Other Ambulatory Visit: Payer: Self-pay

## 2020-12-11 ENCOUNTER — Emergency Department: Payer: Self-pay

## 2020-12-11 ENCOUNTER — Emergency Department
Admission: EM | Admit: 2020-12-11 | Discharge: 2020-12-11 | Disposition: A | Discharge status: Home / Self Care | Payer: Self-pay | Attending: Emergency Medicine | Admitting: Emergency Medicine

## 2020-12-11 ENCOUNTER — Encounter: Payer: Self-pay | Admitting: Radiology

## 2020-12-11 DIAGNOSIS — R9431 Abnormal electrocardiogram [ECG] [EKG]: Secondary | ICD-10-CM | POA: Insufficient documentation

## 2020-12-11 DIAGNOSIS — R778 Other specified abnormalities of plasma proteins: Secondary | ICD-10-CM

## 2020-12-11 DIAGNOSIS — R059 Cough, unspecified: Secondary | ICD-10-CM | POA: Insufficient documentation

## 2020-12-11 DIAGNOSIS — R1084 Generalized abdominal pain: Secondary | ICD-10-CM

## 2020-12-11 DIAGNOSIS — F17210 Nicotine dependence, cigarettes, uncomplicated: Secondary | ICD-10-CM | POA: Insufficient documentation

## 2020-12-11 DIAGNOSIS — K219 Gastro-esophageal reflux disease without esophagitis: Secondary | ICD-10-CM | POA: Insufficient documentation

## 2020-12-11 DIAGNOSIS — Z20822 Contact with and (suspected) exposure to covid-19: Secondary | ICD-10-CM | POA: Insufficient documentation

## 2020-12-11 DIAGNOSIS — K7030 Alcoholic cirrhosis of liver without ascites: Secondary | ICD-10-CM | POA: Insufficient documentation

## 2020-12-11 DIAGNOSIS — R0600 Dyspnea, unspecified: Secondary | ICD-10-CM | POA: Insufficient documentation

## 2020-12-11 DIAGNOSIS — J45909 Unspecified asthma, uncomplicated: Secondary | ICD-10-CM | POA: Insufficient documentation

## 2020-12-11 DIAGNOSIS — I110 Hypertensive heart disease with heart failure: Secondary | ICD-10-CM | POA: Insufficient documentation

## 2020-12-11 DIAGNOSIS — K5900 Constipation, unspecified: Secondary | ICD-10-CM | POA: Insufficient documentation

## 2020-12-11 DIAGNOSIS — I509 Heart failure, unspecified: Secondary | ICD-10-CM

## 2020-12-11 DIAGNOSIS — F1010 Alcohol abuse, uncomplicated: Secondary | ICD-10-CM | POA: Insufficient documentation

## 2020-12-11 DIAGNOSIS — I5022 Chronic systolic (congestive) heart failure: Secondary | ICD-10-CM | POA: Insufficient documentation

## 2020-12-11 DIAGNOSIS — Z79899 Other long term (current) drug therapy: Secondary | ICD-10-CM | POA: Insufficient documentation

## 2020-12-11 LAB — CBC
HCT: 33.7 % — ABNORMAL LOW (ref 39.0–52.0)
Hemoglobin: 11.8 g/dL — ABNORMAL LOW (ref 13.0–17.0)
MCH: 28.8 pg (ref 26.0–34.0)
MCHC: 35 g/dL (ref 30.0–36.0)
MCV: 82.2 fL (ref 80.0–100.0)
Platelets: 268 10*3/uL (ref 150–400)
RBC: 4.1 MIL/uL — ABNORMAL LOW (ref 4.22–5.81)
RDW: 14.6 % (ref 11.5–15.5)
WBC: 7.2 10*3/uL (ref 4.0–10.5)
nRBC: 0 % (ref 0.0–0.2)

## 2020-12-11 LAB — COMPREHENSIVE METABOLIC PANEL
ALT: 45 U/L — ABNORMAL HIGH (ref 0–44)
AST: 63 U/L — ABNORMAL HIGH (ref 15–41)
Albumin: 3.6 g/dL (ref 3.5–5.0)
Alkaline Phosphatase: 129 U/L — ABNORMAL HIGH (ref 38–126)
Anion gap: 9 (ref 5–15)
BUN: 17 mg/dL (ref 6–20)
CO2: 20 mmol/L — ABNORMAL LOW (ref 22–32)
Calcium: 9.1 mg/dL (ref 8.9–10.3)
Chloride: 110 mmol/L (ref 98–111)
Creatinine, Ser: 1.04 mg/dL (ref 0.61–1.24)
GFR, Estimated: >60 mL/min (ref >60)
Glucose, Bld: 120 mg/dL — ABNORMAL HIGH (ref 70–99)
Potassium: 3.6 mmol/L (ref 3.5–5.1)
Sodium: 139 mmol/L (ref 135–145)
Total Bilirubin: 0.7 mg/dL (ref 0.3–1.2)
Total Protein: 6.7 g/dL (ref 6.5–8.1)

## 2020-12-11 LAB — MAGNESIUM: Magnesium: 1.8 mg/dL (ref 1.7–2.4)

## 2020-12-11 LAB — URINALYSIS, COMPLETE (UACMP) WITH MICROSCOPIC
Bilirubin Urine: NEGATIVE
Glucose, UA: NEGATIVE mg/dL
Ketones, ur: NEGATIVE mg/dL
Leukocytes,Ua: NEGATIVE
Nitrite: NEGATIVE
Protein, ur: 100 mg/dL — AB
Specific Gravity, Urine: 1.016 (ref 1.005–1.030)
Squamous Epithelial / HPF: NONE SEEN (ref 0–5)
pH: 5 (ref 5.0–8.0)

## 2020-12-11 LAB — TROPONIN I (HIGH SENSITIVITY): Troponin I (High Sensitivity): 31 ng/L — ABNORMAL HIGH (ref <18)

## 2020-12-11 LAB — RESP PANEL BY RT-PCR (FLU A&B, COVID) ARPGX2
Influenza A by PCR: NEGATIVE
Influenza B by PCR: NEGATIVE
SARS Coronavirus 2 by RT PCR: NEGATIVE

## 2020-12-11 LAB — PROTIME-INR
INR: 1 (ref 0.8–1.2)
Prothrombin Time: 13.1 seconds (ref 11.4–15.2)

## 2020-12-11 LAB — LIPASE, BLOOD: Lipase: 20 U/L (ref 11–51)

## 2020-12-11 LAB — BRAIN NATRIURETIC PEPTIDE: B Natriuretic Peptide: 1843.9 pg/mL — ABNORMAL HIGH (ref 0.0–100.0)

## 2020-12-11 MED ORDER — POTASSIUM CHLORIDE ER 10 MEQ PO TBCR: 20.0000 meq | EXTENDED_RELEASE_TABLET | Freq: Every day | ORAL | 0 refills | Status: DC

## 2020-12-11 MED ORDER — FUROSEMIDE 10 MG/ML IJ SOLN
40.0000 mg | Freq: Once | INTRAMUSCULAR | Status: AC
  Administered 2020-12-11: 40 mg via INTRAVENOUS
  Filled 2020-12-11: qty 4

## 2020-12-11 MED ORDER — IOHEXOL 9 MG/ML PO SOLN
500.0000 mL | ORAL | Status: AC
  Administered 2020-12-11 (×2): 500 mL via ORAL

## 2020-12-11 MED ORDER — FUROSEMIDE 40 MG PO TABS: 40.0000 mg | ORAL_TABLET | Freq: Every day | ORAL | 11 refills | Status: DC

## 2020-12-11 MED ORDER — IOHEXOL 300 MG/ML  SOLN
100.0000 mL | Freq: Once | INTRAMUSCULAR | Status: AC | PRN
  Administered 2020-12-11: 100 mL via INTRAVENOUS

## 2020-12-11 NOTE — ED Notes (Signed)
Patient requesting meds to be sent to [facility]. Dr. Katrinka Blazing notified.

## 2020-12-11 NOTE — Discharge Instructions (Addendum)
Use MiraLAX one half capful every hour until your first bowel movement.  You can then use as much or as little MiraLAX as needed for 1 solid bowel movement per day.

## 2020-12-11 NOTE — ED Provider Notes (Signed)
[facility] Emergency Department Provider Note   ____________________________________________   Event Date/Time   First MD Initiated Contact with Patient 12/11/20 [phone]     (approximate)  I have reviewed the triage vital signs and the nursing notes.   HISTORY  Chief Complaint Cough and Abdominal Pain    HPI Mario Beck is a 40 y.o. male with a stated past medical history of alcohol abuse, asthma, depression, and hypertension who presents for abdominal distention, abdominal pressure, and cough with sputum production is been worsening over the last week.  Patient describes distention of his abdomen with generalized abdominal pain that is worse with p.o. intake.  Patient states that he has been extremely constipated and took a laxative yesterday but only had a small bowel movement.  Patient endorses associated dyspnea on exertion.  Patient currently denies any vision changes, tinnitus, difficulty speaking, facial droop, sore throat, chest pain, shortness of breath, nausea/vomiting/diarrhea, dysuria, or weakness/numbness/paresthesias in any extremity         Past Medical History:  Diagnosis Date  . Alcohol abuse   . Asthma   . Depression   . Hypertension     Patient Active Problem List   Diagnosis Date Noted  . Chronic systolic CHF (congestive heart failure) (HCC) 09/17/2020  . Alcoholic cardiomyopathy (HCC) 02/54/2706  . Alcoholic pancreatitis 09/16/2020  . Tobacco abuse 09/16/2020  . Abnormal EKG 09/16/2020  . GERD (gastroesophageal reflux disease) 09/16/2020  . Depression 09/16/2020  . Dark stools 09/16/2020  . Elevated troponin 09/16/2020  . Transaminitis 03/31/2020  . Nausea and vomiting 02/21/2020  . Intractable nausea and vomiting 02/21/2020  . Tobacco abuse counseling   . Acute alcoholic pancreatitis 02/18/2020  . HTN (hypertension) 02/18/2020  . Alcohol use disorder, moderate, dependence (HCC) 02/18/2020  . Acute pancreatitis  02/18/2020    Past Surgical History:  Procedure Laterality Date  . FRACTURE SURGERY     Left leg as a kid    Prior to Admission medications   Medication Sig Start Date End Date Taking? Authorizing Provider  amLODipine (NORVASC) 10 MG tablet Take 1 tablet (10 mg total) by mouth daily. 04/03/20 09/16/20  Charise Killian, MD  atorvastatin (LIPITOR) 40 MG tablet Take 1 tablet (40 mg total) by mouth daily. 09/18/20   Marrion Coy, MD  carvedilol (COREG) 12.5 MG tablet Take 1 tablet (12.5 mg total) by mouth 2 (two) times daily with a meal. 09/18/20   Marrion Coy, MD  furosemide (LASIX) 40 MG tablet Take 1 tablet (40 mg total) by mouth daily. 12/11/20 12/11/21  Gilles Chiquito, MD  HYDROcodone-acetaminophen (NORCO) 5-325 MG tablet Take 1 tablet by mouth every 6 (six) hours as needed for moderate pain. 11/29/20   Irean Hong, MD  ibuprofen (ADVIL) 800 MG tablet Take 1 tablet (800 mg total) by mouth every 8 (eight) hours as needed for moderate pain. 11/29/20   Irean Hong, MD  mirtazapine (REMERON) 15 MG tablet Take 1 tablet (15 mg total) by mouth at bedtime. 02/23/20   Pennie Banter, DO  nicotine (NICODERM CQ - DOSED IN MG/24 HOURS) 21 mg/24hr patch Place 1 patch (21 mg total) onto the skin daily. 09/18/20   Marrion Coy, MD  pantoprazole (PROTONIX) 40 MG tablet Take 1 tablet (40 mg total) by mouth daily. 09/18/20 10/18/20  Marrion Coy, MD  potassium chloride (KLOR-CON) 10 MEQ tablet Take 2 tablets (20 mEq total) by mouth daily. 12/11/20 01/10/21  Gilles Chiquito, MD  sertraline (ZOLOFT)  100 MG tablet Take 1 tablet (100 mg total) by mouth daily in the afternoon. 04/03/20 05/03/20  Charise Killian, MD  spironolactone (ALDACTONE) 25 MG tablet Take 1 tablet (25 mg total) by mouth daily. 09/19/20   Marrion Coy, MD    Allergies Lisinopril and Shellfish allergy  Family History  Problem Relation Age of Onset  . Colon cancer Neg Hx   . Esophageal cancer Neg Hx     Social History Social History    Tobacco Use  . Smoking status: Current Every Day Smoker    Packs/day: 1.00    Types: Cigarettes  . Smokeless tobacco: Current User    Types: Snuff  . Tobacco comment: Does pouches as welll   Vaping Use  . Vaping Use: Never used  Substance Use Topics  . Alcohol use: Yes    Alcohol/week: 7.0 standard drinks    Types: 7 Cans of beer per week  . Drug use: No    Review of Systems Constitutional: No fever/chills Eyes: No visual changes. ENT: No sore throat. Cardiovascular: Denies chest pain. Respiratory: Denies shortness of breath. Gastrointestinal: Endorses abdominal pain.  No nausea, no vomiting.  No diarrhea. Genitourinary: Negative for dysuria. Musculoskeletal: Negative for acute arthralgias Skin: Negative for rash. Neurological: Negative for headaches, weakness/numbness/paresthesias in any extremity Psychiatric: Negative for suicidal ideation/homicidal ideation   ____________________________________________   PHYSICAL EXAM:  VITAL SIGNS: ED Triage Vitals [12/11/20 0310]  Enc Vitals Group     BP (!) 153/98     Pulse Rate (!) 102     Resp 20     Temp 98.1 F (36.7 C)     Temp Source Oral     SpO2 98 %     Weight 190 lb (86.2 kg)     Height 6\' 3"  (1.905 m)     Head Circumference      Peak Flow      Pain Score 0     Pain Loc      Pain Edu?      Excl. in GC?    Constitutional: Alert and oriented. Well appearing and in no acute distress. Eyes: Conjunctivae are normal. PERRL. Head: Atraumatic. Nose: No congestion/rhinnorhea.  Inflamed nasal turbinates Mouth/Throat: Mucous membranes are moist. Neck: No stridor Cardiovascular: Grossly normal heart sounds.  Good peripheral circulation. Respiratory: Normal respiratory effort.  No retractions. Gastrointestinal: Mildly distended, soft, no tenderness to palpation Musculoskeletal: No obvious deformities Neurologic:  Normal speech and language. No gross focal neurologic deficits are appreciated. Skin:  Skin is warm  and dry. No rash noted. Psychiatric: Mood and affect are normal. Speech and behavior are normal.  ____________________________________________   LABS (all labs ordered are listed, but only abnormal results are displayed)  Labs Reviewed  CBC - Abnormal; Notable for the following components:      Result Value   RBC 4.10 (*)    Hemoglobin 11.8 (*)    HCT 33.7 (*)    All other components within normal limits  COMPREHENSIVE METABOLIC PANEL - Abnormal; Notable for the following components:   CO2 20 (*)    Glucose, Bld 120 (*)    AST 63 (*)    ALT 45 (*)    Alkaline Phosphatase 129 (*)    All other components within normal limits  URINALYSIS, COMPLETE (UACMP) WITH MICROSCOPIC - Abnormal; Notable for the following components:   Color, Urine YELLOW (*)    APPearance HAZY (*)    Hgb urine dipstick SMALL (*)    Protein,  ur 100 (*)    Bacteria, UA RARE (*)    All other components within normal limits  BRAIN NATRIURETIC PEPTIDE - Abnormal; Notable for the following components:   B Natriuretic Peptide 1,843.9 (*)    All other components within normal limits  TROPONIN I (HIGH SENSITIVITY) - Abnormal; Notable for the following components:   Troponin I (High Sensitivity) 31 (*)    All other components within normal limits  RESP PANEL BY RT-PCR (FLU A&B, COVID) ARPGX2  LIPASE, BLOOD  PROTIME-INR  MAGNESIUM    RADIOLOGY  ED MD interpretation: Radiology pending at the time of signout  Official radiology report(s): No results found.  ____________________________________________   PROCEDURES  Procedure(s) performed (including Critical Care):  Procedures   ____________________________________________   INITIAL IMPRESSION / ASSESSMENT AND PLAN / ED COURSE  As part of my medical decision making, I reviewed the following data within the electronic MEDICAL RECORD NUMBER Nursing notes reviewed and incorporated, Labs reviewed, EKG interpreted, Old chart reviewed, Radiograph reviewed and  Notes from prior ED visits reviewed and incorporated        Patients history and exam most consistent with constipation as an etiology for their pain.  Patients symptoms not typical for other emergent causes of abdominal pain such as, but not limited to, appendicitis, abdominal aortic aneurysm, pancreatitis, SBO, mesenteric ischemia, serious intra-abdominal bacterial illness.  Patient without red flags concerning for cancer as a constipation etiology.  Disposition:  Care of this patient will be signed out to the oncoming physician at the end of my shift.  All pertinent patient information conveyed and all questions answered.  All further care and disposition decisions will be made by the oncoming physician.      ____________________________________________   FINAL CLINICAL IMPRESSION(S) / ED DIAGNOSES  Final diagnoses:  Cough  Generalized abdominal pain  Constipation, unspecified constipation type  Acute on chronic heart failure, unspecified heart failure type (HCC)  Alcohol abuse  Troponin I above reference range  QT prolongation  Alcoholic cirrhosis of liver without ascites [facility])     ED Discharge Orders         Ordered    furosemide (LASIX) 40 MG tablet  Daily,   Status:  Discontinued        12/11/20 0853    potassium chloride (KLOR-CON) 10 MEQ tablet  Daily,   Status:  Discontinued        12/11/20 0853    furosemide (LASIX) 40 MG tablet  Daily        12/11/20 0914    potassium chloride (KLOR-CON) 10 MEQ tablet  Daily        12/11/20 0914           Note:  This document was prepared using Dragon voice recognition software and may include unintentional dictation errors.   Merwyn Katos, MD 12/14/20 [phone]

## 2020-12-11 NOTE — ED Provider Notes (Signed)
I assumed care of this patient approximately 0 700.  PCF requires note for full details regarding patient's initial evaluation assessment.  In brief patient presents for assessment of some shortness of breath associated nonproductive cough and abdominal bloating over the last week.  He does have a history of heart failure with an EF of 30 to 35% as well as alcohol abuse stating he still drinks approximately 2-3 alcoholic practice per night but will drink up to a pint of liquor on the weekends.  He denies any chest pain or abdominal pain.  On my reassessment patient is abdomen is soft nontender throughout.  He does have lower extremity edema and some rales in his lower lungs.  EKG obtained shows sinus rhythm with a ventricular rate of 94, nonspecific T wave changes in the lateral leads and QTc interval of 524.   CXR 1. Small new pleural effusions since last month greater on the left. More nonspecific mild perihilar opacity, perhaps atelectasis or vascular congestion. 2. Consider recurrent pancreatitis in this setting. CT A/P 1. Circumferential thickening of the gallbladder with trace pericholecystic fluid. No visible calcified gallstones. Findings are nonspecific but can be seen with acute cholecystitis in the appropriate clinical setting. 2. Mild urinary bladder wall thickening is nonspecific but given some faint hazy perivesicular stranding, a cystitis is not excluded. Recommend correlation with urinalysis. 3. Small bilateral effusions, left greater than right, with interlobular septal thickening and very faint hazy centrilobular ground-glass. Findings consistent with developing mild pulmonary edema. With effusions and cardiomegaly, could reflect changes of congestive heart failure/volume overload. 4. Hepatic steatosis.  CMP remarkable for AST of 63 and ALT of 45 as well as alk phos of 129 without any other significant arrangements.  CBC shows hemoglobin of 11.8 compared to 15.85-monthago  without any other significant derangements.  Lipase is 25 elicitation for acute pancreatitis.  BNP is elevated at 1843.  Covid is negative.  Initial eye is WNL.  Troponin is 31 compared to 18 months ago  Overall I suspect patient's shortness of breath is likely related to decompensated heart failure.  Suspect mildly elevated troponin is secondary to some demand ischemia as patient denies any chest pain have a lower suspicion for coronary fibrosis at this time.  He is not currently on significant diuretics he was given a dose of Lasix in the emergency room.  Did discuss patient with on-call cardiologist Dr. GRockey Situ  He recommends initiating 40 mg of Lasix daily as well as 20 mEq of potassium supplementation.  30 strongly recommended close outpatient follow-up this coming week.  Rx written for these medications.  Patient discharged stable condition.  Return cautions advised and discussed.  Repeatedly emphasized importance of alcohol cessation and close outpatient cardiology follow-up.   SLucrezia Starch MD 12/11/20 [phone]

## 2020-12-11 NOTE — ED Triage Notes (Signed)
Pt in with co abd pain and distention since last week. States has been constipated, states took laxative yesterday and had small BM. Denies any n.v.d at this time.

## 2020-12-22 ENCOUNTER — Telehealth: Payer: Self-pay

## 2020-12-22 NOTE — Telephone Encounter (Signed)
Attempted to schedule.  LMOV to call office.  ° °

## 2020-12-22 NOTE — Telephone Encounter (Signed)
-----   Message from Loman Chroman sent at 12/11/2020 11:22 AM EST ----- LVM for patient to schedule  ----- Message ----- From: Antonieta Iba, MD Sent: 12/11/2020   8:54 AM EST To: [facility] Scheduling  Can we see him in clinic for cardiomyopathy, etoh Preferably in 7 to 10 days Seen in the ER, New patient Thx TG

## 2021-01-26 NOTE — Telephone Encounter (Signed)
Attempted to schedule no ans no vm  

## 2021-01-27 ENCOUNTER — Emergency Department
Admission: EM | Admit: 2021-01-27 | Discharge: 2021-01-27 | Disposition: A | Discharge status: Home / Self Care | Payer: Self-pay | Attending: Emergency Medicine | Admitting: Emergency Medicine

## 2021-01-27 ENCOUNTER — Encounter: Payer: Self-pay | Admitting: Emergency Medicine

## 2021-01-27 ENCOUNTER — Other Ambulatory Visit: Payer: Self-pay

## 2021-01-27 ENCOUNTER — Emergency Department: Payer: Self-pay

## 2021-01-27 DIAGNOSIS — J45909 Unspecified asthma, uncomplicated: Secondary | ICD-10-CM | POA: Insufficient documentation

## 2021-01-27 DIAGNOSIS — I110 Hypertensive heart disease with heart failure: Secondary | ICD-10-CM | POA: Insufficient documentation

## 2021-01-27 DIAGNOSIS — I5022 Chronic systolic (congestive) heart failure: Secondary | ICD-10-CM | POA: Insufficient documentation

## 2021-01-27 DIAGNOSIS — R110 Nausea: Secondary | ICD-10-CM | POA: Insufficient documentation

## 2021-01-27 DIAGNOSIS — Z20822 Contact with and (suspected) exposure to covid-19: Secondary | ICD-10-CM | POA: Insufficient documentation

## 2021-01-27 DIAGNOSIS — F17210 Nicotine dependence, cigarettes, uncomplicated: Secondary | ICD-10-CM | POA: Insufficient documentation

## 2021-01-27 DIAGNOSIS — R5383 Other fatigue: Secondary | ICD-10-CM | POA: Insufficient documentation

## 2021-01-27 DIAGNOSIS — J069 Acute upper respiratory infection, unspecified: Secondary | ICD-10-CM | POA: Insufficient documentation

## 2021-01-27 DIAGNOSIS — Z79899 Other long term (current) drug therapy: Secondary | ICD-10-CM | POA: Insufficient documentation

## 2021-01-27 LAB — RESP PANEL BY RT-PCR (FLU A&B, COVID) ARPGX2
Influenza A by PCR: NEGATIVE
Influenza B by PCR: NEGATIVE
SARS Coronavirus 2 by RT PCR: NEGATIVE

## 2021-01-27 MED ORDER — IPRATROPIUM-ALBUTEROL 0.5-2.5 (3) MG/3ML IN SOLN
3.0000 mL | Freq: Once | RESPIRATORY_TRACT | Status: AC
  Administered 2021-01-27: 3 mL via RESPIRATORY_TRACT
  Filled 2021-01-27: qty 3

## 2021-01-27 NOTE — ED Triage Notes (Signed)
Pt comes into the ED via POV c/o cough, congestion, emesis, and overall feeling "poorly".  Pt states he started having symptoms 3 days ago.  Pt denies any known fevers but states he has had chills.  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  Pt ambulatory to triage and in NAD with even and unlabored respirations.

## 2021-01-27 NOTE — ED Provider Notes (Signed)
[facility] Emergency Department Provider Note   ____________________________________________    I have reviewed the triage vital signs and the nursing notes.   HISTORY  Chief Complaint Cough, Nasal Congestion, and Emesis     HPI Mario Beck is a 40 y.o. male who presents with complaints of cough, fatigue, nasal congestion, mild nausea for the last 2 to 3 days.  He also reports some mild tightness in his chest with coughing and shortness of breath.  Does not know if he has had a fever.  Denies abdominal pain.  No extremity pain or swelling.  Was reportedly prescribed Lasix but has not been taking it  Past Medical History:  Diagnosis Date  . Alcohol abuse   . Asthma   . Depression   . Hypertension     Patient Active Problem List   Diagnosis Date Noted  . Chronic systolic CHF (congestive heart failure) (HCC) 09/17/2020  . Alcoholic cardiomyopathy (HCC) 16/07/9603  . Alcoholic pancreatitis 09/16/2020  . Tobacco abuse 09/16/2020  . Abnormal EKG 09/16/2020  . GERD (gastroesophageal reflux disease) 09/16/2020  . Depression 09/16/2020  . Dark stools 09/16/2020  . Elevated troponin 09/16/2020  . Transaminitis 03/31/2020  . Nausea and vomiting 02/21/2020  . Intractable nausea and vomiting 02/21/2020  . Tobacco abuse counseling   . Acute alcoholic pancreatitis 02/18/2020  . HTN (hypertension) 02/18/2020  . Alcohol use disorder, moderate, dependence (HCC) 02/18/2020  . Acute pancreatitis 02/18/2020    Past Surgical History:  Procedure Laterality Date  . FRACTURE SURGERY     Left leg as a kid    Prior to Admission medications   Medication Sig Start Date End Date Taking? Authorizing Provider  amLODipine (NORVASC) 10 MG tablet Take 1 tablet (10 mg total) by mouth daily. 04/03/20 09/16/20  Charise Killian, MD  atorvastatin (LIPITOR) 40 MG tablet Take 1 tablet (40 mg total) by mouth daily. 09/18/20   Marrion Coy, MD  atorvastatin (LIPITOR)  40 MG tablet TAKE ONE TABLET BY MOUTH EVERY DAY 09/18/20 09/18/21  Marrion Coy, MD  carvedilol (COREG) 12.5 MG tablet Take 1 tablet (12.5 mg total) by mouth 2 (two) times daily with a meal. 09/18/20   Marrion Coy, MD  carvedilol (COREG) 12.5 MG tablet TAKE ONE TABLET BY MOUTH 2 TIMES A DAY WITH A MEAL 09/18/20 09/18/21  Marrion Coy, MD  furosemide (LASIX) 40 MG tablet Take 1 tablet (40 mg total) by mouth daily. 12/11/20 12/11/21  Gilles Chiquito, MD  HYDROcodone-acetaminophen (NORCO) 5-325 MG tablet Take 1 tablet by mouth every 6 (six) hours as needed for moderate pain. 11/29/20   Irean Hong, MD  ibuprofen (ADVIL) 800 MG tablet Take 1 tablet (800 mg total) by mouth every 8 (eight) hours as needed for moderate pain. 11/29/20   Irean Hong, MD  mirtazapine (REMERON) 15 MG tablet Take 1 tablet (15 mg total) by mouth at bedtime. 02/23/20   Pennie Banter, DO  nicotine (NICODERM CQ - DOSED IN MG/24 HOURS) 21 mg/24hr patch Place 1 patch (21 mg total) onto the skin daily. 09/18/20   Marrion Coy, MD  pantoprazole (PROTONIX) 40 MG tablet Take 1 tablet (40 mg total) by mouth daily. 09/18/20 10/18/20  Marrion Coy, MD  pantoprazole (PROTONIX) 40 MG tablet TAKE ONE TABLET BY MOUTH EVERY DAY 09/18/20 09/18/21  Marrion Coy, MD  potassium chloride (KLOR-CON) 10 MEQ tablet Take 2 tablets (20 mEq total) by mouth daily. 12/11/20 01/10/21  Gilles Chiquito, MD  sertraline (ZOLOFT) 100 MG tablet Take 1 tablet (100 mg total) by mouth daily in the afternoon. 04/03/20 05/03/20  Charise Killian, MD  spironolactone (ALDACTONE) 25 MG tablet Take 1 tablet (25 mg total) by mouth daily. 09/19/20   Marrion Coy, MD  spironolactone (ALDACTONE) 25 MG tablet TAKE ONE TABLET BY MOUTH EVERY DAY 09/18/20 06/16/21  Marrion Coy, MD  thiamine (VITAMIN B-1) 100 MG tablet TAKE ONE TABLET BY MOUTH EVERY DAY FOR 14 DAYS 09/18/20 09/18/21  Marrion Coy, MD     Allergies Lisinopril and Shellfish allergy  Family History  Problem Relation Age of  Onset  . Colon cancer Neg Hx   . Esophageal cancer Neg Hx     Social History Social History   Tobacco Use  . Smoking status: Current Every Day Smoker    Packs/day: 1.00    Types: Cigarettes  . Smokeless tobacco: Current User    Types: Snuff  . Tobacco comment: Does pouches as welll   Vaping Use  . Vaping Use: Never used  Substance Use Topics  . Alcohol use: Yes    Alcohol/week: 7.0 standard drinks    Types: 7 Cans of beer per week  . Drug use: No    Review of Systems  Constitutional: No fever/chills  ENT: Mild sore throat  Respiratory: As above Gastrointestinal: No abdominal pain.  No nausea, no vomiting.   Genitourinary: Negative for dysuria. Musculoskeletal: Some myalgias Skin: Negative for rash. Neurological: Negative for headaches     ____________________________________________   PHYSICAL EXAM:  VITAL SIGNS: ED Triage Vitals  Enc Vitals Group     BP 01/27/21 1605 (!) 152/94     Pulse Rate 01/27/21 1605 (!) 125     Resp 01/27/21 1605 18     Temp 01/27/21 1605 98.6 F (37 C)     Temp Source 01/27/21 1605 Oral     SpO2 01/27/21 1605 98 %     Weight 01/27/21 1606 83.9 kg (185 lb)     Height 01/27/21 1606 1.905 m (6\' 3" )     Head Circumference --      Peak Flow --      Pain Score 01/27/21 1606 4     Pain Loc --      Pain Edu? --      Excl. in GC? --      Constitutional: Alert and oriented. No acute distress. Pleasant and interactive Eyes: Conjunctivae are normal.  Head: Atraumatic. Nose: No congestion/rhinnorhea. Mouth/Throat: Mucous membranes are moist.   Cardiovascular: Tachycardia, regular rhythm.  Respiratory: Normal respiratory effort.  No retractions. Genitourinary: deferred Musculoskeletal: No lower extremity tenderness nor edema.   Neurologic:  Normal speech and language. No gross focal neurologic deficits are appreciated.   Skin:  Skin is warm, dry and intact. No rash noted.   ____________________________________________    LABS (all labs ordered are listed, but only abnormal results are displayed)  Labs Reviewed  RESP PANEL BY RT-PCR (FLU A&B, COVID) ARPGX2   ____________________________________________  EKG   ____________________________________________  RADIOLOGY  Chest x-ray read by me, no infiltrate or effusion ____________________________________________   PROCEDURES  Procedure(s) performed: No  Procedures   Critical Care performed: No ____________________________________________   INITIAL IMPRESSION / ASSESSMENT AND PLAN / ED COURSE  Pertinent labs & imaging results that were available during my care of the patient were reviewed by me and considered in my medical decision making (see chart for details).  Patient presents with fatigue, cough, runny nose most consistent with  viral illness.  Mildly tachycardic upon arrival, this improved with rest.  Scattered very mild wheezes on exam, DuoNeb given with improvement  Covid swab sent  Chest x-ray negative for pneumonia or effusion  Appropriate for discharge with outpatient follow-up.  Heart rate 105 at discharge   ____________________________________________   FINAL CLINICAL IMPRESSION(S) / ED DIAGNOSES  Final diagnoses:  Viral URI with cough      NEW MEDICATIONS STARTED DURING THIS VISIT:  Discharge Medication List as of 01/27/2021  5:45 PM       Note:  This document was prepared using Dragon voice recognition software and may include unintentional dictation errors.   Jene Every, MD 01/27/21 [phone]

## 2021-01-27 NOTE — ED Notes (Signed)
Pt verbalized understanding of d/c instructions at this time and had the opportunity to ask questions as needed. Pt ambulatory to ED lobby at this time, NAD noted, RR even and unlabored, steady gait noted.

## 2021-02-19 ENCOUNTER — Other Ambulatory Visit: Payer: Self-pay

## 2021-02-19 ENCOUNTER — Emergency Department: Payer: Self-pay

## 2021-02-19 ENCOUNTER — Inpatient Hospital Stay
Admission: EM | Admit: 2021-02-19 | Discharge: 2021-02-21 | DRG: 439 | Discharge status: Against Medical Advice | Payer: Self-pay | Attending: Internal Medicine | Admitting: Internal Medicine

## 2021-02-19 DIAGNOSIS — F32A Depression, unspecified: Secondary | ICD-10-CM | POA: Diagnosis present

## 2021-02-19 DIAGNOSIS — J45909 Unspecified asthma, uncomplicated: Secondary | ICD-10-CM | POA: Diagnosis present

## 2021-02-19 DIAGNOSIS — F10239 Alcohol dependence with withdrawal, unspecified: Secondary | ICD-10-CM | POA: Diagnosis present

## 2021-02-19 DIAGNOSIS — F17200 Nicotine dependence, unspecified, uncomplicated: Secondary | ICD-10-CM | POA: Diagnosis present

## 2021-02-19 DIAGNOSIS — K852 Alcohol induced acute pancreatitis without necrosis or infection: Principal | ICD-10-CM

## 2021-02-19 DIAGNOSIS — Z79899 Other long term (current) drug therapy: Secondary | ICD-10-CM

## 2021-02-19 DIAGNOSIS — I426 Alcoholic cardiomyopathy: Secondary | ICD-10-CM | POA: Diagnosis present

## 2021-02-19 DIAGNOSIS — K859 Acute pancreatitis without necrosis or infection, unspecified: Secondary | ICD-10-CM

## 2021-02-19 DIAGNOSIS — Z20822 Contact with and (suspected) exposure to covid-19: Secondary | ICD-10-CM | POA: Diagnosis present

## 2021-02-19 DIAGNOSIS — Z5329 Procedure and treatment not carried out because of patient's decision for other reasons: Secondary | ICD-10-CM | POA: Diagnosis not present

## 2021-02-19 DIAGNOSIS — I10 Essential (primary) hypertension: Secondary | ICD-10-CM

## 2021-02-19 DIAGNOSIS — F10930 Alcohol use, unspecified with withdrawal, uncomplicated: Secondary | ICD-10-CM

## 2021-02-19 DIAGNOSIS — Y900 Blood alcohol level of less than 20 mg/100 ml: Secondary | ICD-10-CM | POA: Diagnosis present

## 2021-02-19 DIAGNOSIS — E785 Hyperlipidemia, unspecified: Secondary | ICD-10-CM | POA: Diagnosis present

## 2021-02-19 DIAGNOSIS — Z9114 Patient's other noncompliance with medication regimen: Secondary | ICD-10-CM

## 2021-02-19 DIAGNOSIS — F10230 Alcohol dependence with withdrawal, uncomplicated: Secondary | ICD-10-CM

## 2021-02-19 DIAGNOSIS — IMO0002 Reserved for concepts with insufficient information to code with codable children: Secondary | ICD-10-CM | POA: Diagnosis present

## 2021-02-19 DIAGNOSIS — Z888 Allergy status to other drugs, medicaments and biological substances status: Secondary | ICD-10-CM

## 2021-02-19 DIAGNOSIS — K219 Gastro-esophageal reflux disease without esophagitis: Secondary | ICD-10-CM | POA: Diagnosis present

## 2021-02-19 DIAGNOSIS — R195 Other fecal abnormalities: Secondary | ICD-10-CM | POA: Diagnosis present

## 2021-02-19 LAB — BASIC METABOLIC PANEL
Anion gap: 19 — ABNORMAL HIGH (ref 5–15)
BUN: 13 mg/dL (ref 6–20)
CO2: 17 mmol/L — ABNORMAL LOW (ref 22–32)
Calcium: 8.9 mg/dL (ref 8.9–10.3)
Chloride: 99 mmol/L (ref 98–111)
Creatinine, Ser: 1.09 mg/dL (ref 0.61–1.24)
GFR, Estimated: >60 mL/min (ref >60)
Glucose, Bld: 83 mg/dL (ref 70–99)
Potassium: 4.3 mmol/L (ref 3.5–5.1)
Sodium: 135 mmol/L (ref 135–145)

## 2021-02-19 LAB — HEPATIC FUNCTION PANEL
ALT: 34 U/L (ref 0–44)
AST: 57 U/L — ABNORMAL HIGH (ref 15–41)
Albumin: 3.8 g/dL (ref 3.5–5.0)
Alkaline Phosphatase: 82 U/L (ref 38–126)
Bilirubin, Direct: 0.4 mg/dL — ABNORMAL HIGH (ref 0.0–0.2)
Indirect Bilirubin: 1.2 mg/dL — ABNORMAL HIGH (ref 0.3–0.9)
Total Bilirubin: 1.6 mg/dL — ABNORMAL HIGH (ref 0.3–1.2)
Total Protein: 6.9 g/dL (ref 6.5–8.1)

## 2021-02-19 LAB — CBC
HCT: 39.9 % (ref 39.0–52.0)
Hemoglobin: 14.3 g/dL (ref 13.0–17.0)
MCH: 29.1 pg (ref 26.0–34.0)
MCHC: 35.8 g/dL (ref 30.0–36.0)
MCV: 81.1 fL (ref 80.0–100.0)
Platelets: 188 10*3/uL (ref 150–400)
RBC: 4.92 MIL/uL (ref 4.22–5.81)
RDW: 14.3 % (ref 11.5–15.5)
WBC: 6.9 10*3/uL (ref 4.0–10.5)
nRBC: 0 % (ref 0.0–0.2)

## 2021-02-19 LAB — ETHANOL: Alcohol, Ethyl (B): 18 mg/dL — ABNORMAL HIGH (ref <10)

## 2021-02-19 LAB — RESP PANEL BY RT-PCR (FLU A&B, COVID) ARPGX2
Influenza A by PCR: NEGATIVE
Influenza B by PCR: NEGATIVE
SARS Coronavirus 2 by RT PCR: NEGATIVE

## 2021-02-19 LAB — TROPONIN I (HIGH SENSITIVITY)
Troponin I (High Sensitivity): 62 ng/L — ABNORMAL HIGH (ref <18)
Troponin I (High Sensitivity): 66 ng/L — ABNORMAL HIGH (ref <18)

## 2021-02-19 LAB — LIPASE, BLOOD: Lipase: 48 U/L (ref 11–51)

## 2021-02-19 MED ORDER — IOHEXOL 300 MG/ML  SOLN
100.0000 mL | Freq: Once | INTRAMUSCULAR | Status: AC | PRN
  Administered 2021-02-19: 100 mL via INTRAVENOUS

## 2021-02-19 MED ORDER — ONDANSETRON HCL 4 MG/2ML IJ SOLN: 4.0000 mg | Freq: Four times a day (QID) | INTRAMUSCULAR | Status: DC | PRN

## 2021-02-19 MED ORDER — KETOROLAC TROMETHAMINE 30 MG/ML IJ SOLN
15.0000 mg | Freq: Once | INTRAMUSCULAR | Status: AC
  Administered 2021-02-19: 15 mg via INTRAVENOUS
  Filled 2021-02-19: qty 1

## 2021-02-19 MED ORDER — POTASSIUM CHLORIDE ER 10 MEQ PO TBCR
20.0000 meq | EXTENDED_RELEASE_TABLET | Freq: Every day | ORAL | Status: DC
  Filled 2021-02-19: qty 2

## 2021-02-19 MED ORDER — FUROSEMIDE 40 MG PO TABS
40.0000 mg | ORAL_TABLET | Freq: Every day | ORAL | Status: DC
  Administered 2021-02-20 – 2021-02-21 (×2): 40 mg via ORAL
  Filled 2021-02-19 (×2): qty 1

## 2021-02-19 MED ORDER — MORPHINE SULFATE (PF) 4 MG/ML IV SOLN
4.0000 mg | Freq: Once | INTRAVENOUS | Status: AC
  Administered 2021-02-19: 4 mg via INTRAVENOUS
  Filled 2021-02-19: qty 1

## 2021-02-19 MED ORDER — SPIRONOLACTONE 25 MG PO TABS
25.0000 mg | ORAL_TABLET | Freq: Every day | ORAL | Status: DC
  Administered 2021-02-20 – 2021-02-21 (×2): 25 mg via ORAL
  Filled 2021-02-19 (×3): qty 1

## 2021-02-19 MED ORDER — POTASSIUM CHLORIDE CRYS ER 20 MEQ PO TBCR
20.0000 meq | EXTENDED_RELEASE_TABLET | Freq: Every day | ORAL | Status: DC
  Administered 2021-02-20 – 2021-02-21 (×2): 20 meq via ORAL
  Filled 2021-02-19 (×2): qty 1

## 2021-02-19 MED ORDER — PANTOPRAZOLE SODIUM 40 MG PO TBEC
40.0000 mg | DELAYED_RELEASE_TABLET | Freq: Every day | ORAL | Status: DC
  Administered 2021-02-20 – 2021-02-21 (×2): 40 mg via ORAL
  Filled 2021-02-19 (×2): qty 1

## 2021-02-19 MED ORDER — CARVEDILOL 12.5 MG PO TABS
12.5000 mg | ORAL_TABLET | Freq: Two times a day (BID) | ORAL | Status: DC
  Administered 2021-02-20 – 2021-02-21 (×3): 12.5 mg via ORAL
  Filled 2021-02-19 (×3): qty 1

## 2021-02-19 MED ORDER — PNEUMOCOCCAL VAC POLYVALENT 25 MCG/0.5ML IJ INJ: 0.5000 mL | INJECTION | INTRAMUSCULAR | Status: DC

## 2021-02-19 MED ORDER — ONDANSETRON HCL 4 MG/2ML IJ SOLN
4.0000 mg | Freq: Once | INTRAMUSCULAR | Status: AC
  Administered 2021-02-19: 4 mg via INTRAVENOUS
  Filled 2021-02-19: qty 2

## 2021-02-19 MED ORDER — ENOXAPARIN SODIUM 40 MG/0.4ML IJ SOSY: 40.0000 mg | PREFILLED_SYRINGE | INTRAMUSCULAR | Status: DC

## 2021-02-19 MED ORDER — SODIUM CHLORIDE 0.9 % IV SOLN: INTRAVENOUS | Status: DC

## 2021-02-19 MED ORDER — THIAMINE HCL 100 MG/ML IJ SOLN
Freq: Once | INTRAVENOUS | Status: AC
  Filled 2021-02-19: qty 1000

## 2021-02-19 MED ORDER — LORAZEPAM 2 MG/ML IJ SOLN
1.0000 mg | Freq: Once | INTRAMUSCULAR | Status: AC
  Administered 2021-02-19: 1 mg via INTRAVENOUS
  Filled 2021-02-19: qty 1

## 2021-02-19 MED ORDER — TRAZODONE HCL 50 MG PO TABS
25.0000 mg | ORAL_TABLET | Freq: Every evening | ORAL | Status: DC | PRN
  Administered 2021-02-20: 25 mg via ORAL
  Filled 2021-02-19: qty 1

## 2021-02-19 MED ORDER — MORPHINE SULFATE (PF) 2 MG/ML IV SOLN
2.0000 mg | INTRAVENOUS | Status: DC | PRN
  Administered 2021-02-20 (×2): 2 mg via INTRAVENOUS
  Filled 2021-02-19 (×2): qty 1

## 2021-02-19 MED ORDER — LORAZEPAM 2 MG/ML IJ SOLN
1.0000 mg | INTRAMUSCULAR | Status: DC | PRN
  Administered 2021-02-20 – 2021-02-21 (×4): 1 mg via INTRAVENOUS
  Filled 2021-02-19 (×3): qty 1

## 2021-02-19 MED ORDER — KETOROLAC TROMETHAMINE 15 MG/ML IJ SOLN
15.0000 mg | Freq: Four times a day (QID) | INTRAMUSCULAR | Status: DC | PRN
  Administered 2021-02-20 (×2): 15 mg via INTRAVENOUS
  Filled 2021-02-19 (×3): qty 1

## 2021-02-19 MED ORDER — ONDANSETRON HCL 4 MG PO TABS: 4.0000 mg | ORAL_TABLET | Freq: Four times a day (QID) | ORAL | Status: DC | PRN

## 2021-02-19 MED ORDER — ATORVASTATIN CALCIUM 20 MG PO TABS
40.0000 mg | ORAL_TABLET | Freq: Every day | ORAL | Status: DC
  Administered 2021-02-20 – 2021-02-21 (×2): 40 mg via ORAL
  Filled 2021-02-19 (×2): qty 2

## 2021-02-19 MED ORDER — LACTATED RINGERS IV BOLUS
1000.0000 mL | Freq: Once | INTRAVENOUS | Status: AC
  Administered 2021-02-19: 1000 mL via INTRAVENOUS

## 2021-02-19 MED ORDER — ACETAMINOPHEN 325 MG PO TABS: 650.0000 mg | ORAL_TABLET | Freq: Four times a day (QID) | ORAL | Status: DC | PRN

## 2021-02-19 MED ORDER — MAGNESIUM HYDROXIDE 400 MG/5ML PO SUSP: 30.0000 mL | Freq: Every day | ORAL | Status: DC | PRN

## 2021-02-19 MED ORDER — HYDROMORPHONE HCL 1 MG/ML IJ SOLN
0.5000 mg | Freq: Once | INTRAMUSCULAR | Status: AC
  Administered 2021-02-19: 0.5 mg via INTRAVENOUS
  Filled 2021-02-19: qty 1

## 2021-02-19 MED ORDER — AMLODIPINE BESYLATE 10 MG PO TABS
10.0000 mg | ORAL_TABLET | Freq: Every day | ORAL | Status: DC
  Administered 2021-02-20 – 2021-02-21 (×2): 10 mg via ORAL
  Filled 2021-02-19 (×2): qty 1

## 2021-02-19 MED ORDER — ACETAMINOPHEN 650 MG RE SUPP: 650.0000 mg | Freq: Four times a day (QID) | RECTAL | Status: DC | PRN

## 2021-02-19 MED ORDER — SERTRALINE HCL 50 MG PO TABS
100.0000 mg | ORAL_TABLET | Freq: Every day | ORAL | Status: DC
  Administered 2021-02-20 – 2021-02-21 (×3): 100 mg via ORAL
  Filled 2021-02-19 (×3): qty 2

## 2021-02-19 MED ORDER — THIAMINE HCL 100 MG PO TABS
100.0000 mg | ORAL_TABLET | Freq: Every day | ORAL | Status: DC
  Administered 2021-02-20: 100 mg via ORAL
  Filled 2021-02-19: qty 1

## 2021-02-19 NOTE — ED Notes (Signed)
Patient transported to X-ray 

## 2021-02-19 NOTE — ED Provider Notes (Signed)
[facility] Emergency Department Provider Note   ____________________________________________   Event Date/Time   First MD Initiated Contact with Patient 02/19/21 1848     (approximate)  I have reviewed the triage vital signs and the nursing notes.   HISTORY  Chief Complaint Chest Pain    HPI Mario Beck is a 40 y.o. male with past medical history of hypertension, asthma, GERD, alcohol abuse, and pancreatitis who presents to the ED complaining of abdominal pain.  Patient reports that he initially developed pain in his epigastrium yesterday and pain has gradually worsened since then.  Pain is worse when he eats and has been associated with nausea and multiple episodes of vomiting.  He has had some diarrhea, denies any blood in his stool or emesis.  He has had occasional burning pain, up into his chest but denies any chest pain currently.  He has not had any fevers, cough, or difficulty breathing.  He does admit to regular alcohol consumption, drinking some beer and liquor on a daily basis.  His last drink was yesterday and he is concerned that he is also developing withdrawal symptoms.  He describes current symptoms as similar to prior episodes of pancreatitis.        Past Medical History:  Diagnosis Date  . Alcohol abuse   . Asthma   . Depression   . Hypertension     Patient Active Problem List   Diagnosis Date Noted  . Recurrent pancreatitis 02/19/2021  . Chronic systolic CHF (congestive heart failure) (HCC) 09/17/2020  . Alcoholic cardiomyopathy (HCC) 34/74/2595  . Alcoholic pancreatitis 09/16/2020  . Tobacco abuse 09/16/2020  . Abnormal EKG 09/16/2020  . GERD (gastroesophageal reflux disease) 09/16/2020  . Depression 09/16/2020  . Dark stools 09/16/2020  . Elevated troponin 09/16/2020  . Transaminitis 03/31/2020  . Nausea and vomiting 02/21/2020  . Intractable nausea and vomiting 02/21/2020  . Tobacco abuse counseling   . Acute alcoholic  pancreatitis 02/18/2020  . HTN (hypertension) 02/18/2020  . Alcohol use disorder, moderate, dependence (HCC) 02/18/2020  . Acute pancreatitis 02/18/2020    Past Surgical History:  Procedure Laterality Date  . FRACTURE SURGERY     Left leg as a kid    Prior to Admission medications   Medication Sig Start Date End Date Taking? Authorizing Provider  amLODipine (NORVASC) 10 MG tablet Take 1 tablet (10 mg total) by mouth daily. 04/03/20 09/16/20  Charise Killian, MD  atorvastatin (LIPITOR) 40 MG tablet Take 1 tablet (40 mg total) by mouth daily. 09/18/20   Marrion Coy, MD  atorvastatin (LIPITOR) 40 MG tablet TAKE ONE TABLET BY MOUTH EVERY DAY 09/18/20 09/18/21  Marrion Coy, MD  carvedilol (COREG) 12.5 MG tablet Take 1 tablet (12.5 mg total) by mouth 2 (two) times daily with a meal. 09/18/20   Marrion Coy, MD  carvedilol (COREG) 12.5 MG tablet TAKE ONE TABLET BY MOUTH 2 TIMES A DAY WITH A MEAL 09/18/20 09/18/21  Marrion Coy, MD  furosemide (LASIX) 40 MG tablet Take 1 tablet (40 mg total) by mouth daily. 12/11/20 12/11/21  Gilles Chiquito, MD  HYDROcodone-acetaminophen (NORCO) 5-325 MG tablet Take 1 tablet by mouth every 6 (six) hours as needed for moderate pain. 11/29/20   Irean Hong, MD  ibuprofen (ADVIL) 800 MG tablet Take 1 tablet (800 mg total) by mouth every 8 (eight) hours as needed for moderate pain. 11/29/20   Irean Hong, MD  mirtazapine (REMERON) 15 MG tablet Take 1 tablet (15  mg total) by mouth at bedtime. 02/23/20   Pennie Banter, DO  nicotine (NICODERM CQ - DOSED IN MG/24 HOURS) 21 mg/24hr patch Place 1 patch (21 mg total) onto the skin daily. 09/18/20   Marrion Coy, MD  pantoprazole (PROTONIX) 40 MG tablet Take 1 tablet (40 mg total) by mouth daily. 09/18/20 10/18/20  Marrion Coy, MD  pantoprazole (PROTONIX) 40 MG tablet TAKE ONE TABLET BY MOUTH EVERY DAY 09/18/20 09/18/21  Marrion Coy, MD  potassium chloride (KLOR-CON) 10 MEQ tablet Take 2 tablets (20 mEq total) by mouth daily.  12/11/20 01/10/21  Gilles Chiquito, MD  sertraline (ZOLOFT) 100 MG tablet Take 1 tablet (100 mg total) by mouth daily in the afternoon. 04/03/20 05/03/20  Charise Killian, MD  spironolactone (ALDACTONE) 25 MG tablet Take 1 tablet (25 mg total) by mouth daily. 09/19/20   Marrion Coy, MD  spironolactone (ALDACTONE) 25 MG tablet TAKE ONE TABLET BY MOUTH EVERY DAY 09/18/20 06/16/21  Marrion Coy, MD  thiamine (VITAMIN B-1) 100 MG tablet TAKE ONE TABLET BY MOUTH EVERY DAY FOR 14 DAYS 09/18/20 09/18/21  Marrion Coy, MD    Allergies Lisinopril and Shellfish allergy  Family History  Problem Relation Age of Onset  . Colon cancer Neg Hx   . Esophageal cancer Neg Hx     Social History Social History   Tobacco Use  . Smoking status: Current Every Day Smoker    Packs/day: 1.00    Types: Cigarettes  . Smokeless tobacco: Current User    Types: Snuff  . Tobacco comment: Does pouches as welll   Vaping Use  . Vaping Use: Never used  Substance Use Topics  . Alcohol use: Yes    Alcohol/week: 7.0 standard drinks    Types: 7 Cans of beer per week  . Drug use: No    Review of Systems  Constitutional: No fever/chills Eyes: No visual changes. ENT: No sore throat. Cardiovascular: Positive for chest pain. Respiratory: Denies shortness of breath. Gastrointestinal: Positive for abdominal pain, nausea, vomiting, and diarrhea.  No constipation. Genitourinary: Negative for dysuria. Musculoskeletal: Negative for back pain. Skin: Negative for rash. Neurological: Negative for headaches, focal weakness or numbness.  ____________________________________________   PHYSICAL EXAM:  VITAL SIGNS: ED Triage Vitals  Enc Vitals Group     BP 02/19/21 1811 (!) 139/98     Pulse Rate 02/19/21 1811 (!) 109     Resp 02/19/21 1811 18     Temp 02/19/21 1811 98 F (36.7 C)     Temp Source 02/19/21 1811 Oral     SpO2 02/19/21 1811 99 %     Weight 02/19/21 1825 184 lb 15.5 oz (83.9 kg)     Height 02/19/21 1825  6\' 3"  (1.905 m)     Head Circumference --      Peak Flow --      Pain Score 02/19/21 1825 10     Pain Loc --      Pain Edu? --      Excl. in GC? --     Constitutional: Alert and oriented. Eyes: Conjunctivae are normal. Head: Atraumatic. Nose: No congestion/rhinnorhea. Mouth/Throat: Mucous membranes are moist. Neck: Normal ROM Cardiovascular: Tachycardic, regular rhythm. Grossly normal heart sounds. Respiratory: Normal respiratory effort.  No retractions. Lungs CTAB. Gastrointestinal: Soft and tender to palpation in the epigastrium with voluntary guarding. No distention. Genitourinary: deferred Musculoskeletal: No lower extremity tenderness nor edema. Neurologic:  Normal speech and language. No gross focal neurologic deficits are appreciated. Skin:  Skin is warm, dry and intact. No rash noted. Psychiatric: Mood and affect are normal. Speech and behavior are normal.  ____________________________________________   LABS (all labs ordered are listed, but only abnormal results are displayed)  Labs Reviewed  BASIC METABOLIC PANEL - Abnormal; Notable for the following components:      Result Value   CO2 17 (*)    Anion gap 19 (*)    All other components within normal limits  ETHANOL - Abnormal; Notable for the following components:   Alcohol, Ethyl (B) 18 (*)    All other components within normal limits  HEPATIC FUNCTION PANEL - Abnormal; Notable for the following components:   AST 57 (*)    Total Bilirubin 1.6 (*)    Bilirubin, Direct 0.4 (*)    Indirect Bilirubin 1.2 (*)    All other components within normal limits  TROPONIN I (HIGH SENSITIVITY) - Abnormal; Notable for the following components:   Troponin I (High Sensitivity) 62 (*)    All other components within normal limits  TROPONIN I (HIGH SENSITIVITY) - Abnormal; Notable for the following components:   Troponin I (High Sensitivity) 66 (*)    All other components within normal limits  RESP PANEL BY RT-PCR (FLU A&B,  COVID) ARPGX2  CBC  LIPASE, BLOOD   ____________________________________________  EKG  ED ECG REPORT I, Chesley Noon, the attending physician, personally viewed and interpreted this ECG.   Date: 02/19/2021  EKG Time: 18:09  Rate: 107  Rhythm: sinus tachycardia  Axis: Normal  Intervals:none  ST&T Change: Nonspecific ST/T wave chnages   PROCEDURES  Procedure(s) performed (including Critical Care):  Procedures   ____________________________________________   INITIAL IMPRESSION / ASSESSMENT AND PLAN / ED COURSE       40 year old male with past medical history of hypertension, asthma, GERD, alcohol abuse, and pancreatitis who presents to the ED with gradually worsening pain in his epigastrium since yesterday associated with nausea and vomiting.  Pain is reproducible with palpation of his epigastrium and I suspect he is dealing with recurrent pancreatitis.  He does report occasional burning pain moving up into his chest, likely esophagitis secondary to his recent vomiting.  EKG shows no ischemic changes but troponin is mildly elevated.  I have low suspicion for ACS this time and we will trend troponin.  Chest x-ray reviewed by me and shows no infiltrate, edema, or effusion.  We will treat symptomatically with IV morphine and Zofran, hydrate with IV fluids.  Troponin is stable on recheck and I have a low suspicion for ACS.  Lipase within normal limits but CT scan was performed which does show peripancreatic inflammation consistent with pancreatitis.  Patient continues to complain of significant pain despite morphine and Dilaudid, we will dose with Toradol and case discussed with hospitalist for admission.  His withdrawal symptoms do appear to be improved following a dose of Ativan.      ____________________________________________   FINAL CLINICAL IMPRESSION(S) / ED DIAGNOSES  Final diagnoses:  Alcohol-induced acute pancreatitis without infection or necrosis  Alcohol  withdrawal syndrome without complication [facility])     ED Discharge Orders    None       Note:  This document was prepared using Dragon voice recognition software and may include unintentional dictation errors.   Chesley Noon, MD 02/19/21 2212

## 2021-02-19 NOTE — ED Notes (Signed)
Patient returned from XR. 

## 2021-02-19 NOTE — ED Notes (Signed)
Patient returned from CT

## 2021-02-19 NOTE — ED Triage Notes (Signed)
Patient arrived via POV for chest pain that began a few days ago, but today is progressively worse that feels like burning. C/o pain in the middle of his chest radiating to both sides of his abdomen. C/o diarrhea and vomiting that began 2 days ago.

## 2021-02-19 NOTE — H&P (Addendum)
[place]   PATIENT NAME: Mario Beck    MR#:  [id number]  DATE OF BIRTH:  11-27-1980  DATE OF ADMISSION:  02/19/2021  PRIMARY CARE PHYSICIAN: Pcp, No   Patient is coming from: Home  REQUESTING/REFERRING PHYSICIAN: Chesley Noon, MD  CHIEF COMPLAINT:   Chief Complaint  Patient presents with  . Chest Pain    HISTORY OF PRESENT ILLNESS:  JARAMIE BASTOS is a 40 y.o. African-American male with medical history significant for alcohol abuse, asthma, depression and hypertension, who presented to the emergency room with acute onset of epigastric pain and recurrent nausea and vomiting since yesterday.  His pain has been worsening today.  He had mild diarrhea with dark stools with no melena or bright red bleeding per rectum.  His pain has been burning and it radiates to his chest.  No fever or chills.  No dysuria, oliguria or hematuria or flank pain.  He drinks beer and liquor on a daily basis and last drink was yesterday.  He thought he was having early alcohol withdrawal.  ED Course: Upon presentation to the ER, blood pressure was 139/98 and heart rate of 109 with otherwise normal vital signs.  CMP was remarkable for AST 57 and indirect bili of 1.2 with total bili 1.6 and direct bili of 0.4.  High-sensitivity troponin I was 62 and later 66 and CBC was within normal.  Alcohol level was 18.  Respiratory panel is currently pending.  Lipase was only 48. EKG as reviewed by me : showed sinus tachycardia with a rate of 107 with possible left and left atrial enlargement and minimal voltage criteria for LVH. Imaging: Two-view chest x-ray showed no acute cardiopulmonary disease. Abdominal pelvic CT scan showed changes suggestive of very early pancreatitis with no phlegmon or pseudocyst.  It showed fatty liver.  The patient was given 4 mg IV morphine sulfate and 4 mg of IV Zofran, 0.5 mg of IV Dilaudid, 1 L bolus of IV lactated Ringer, 1 mg of IV Ativan and 15 mg of IV Toradol.  He will be  admitted to a medical bed for further evaluation and management PAST MEDICAL HISTORY:   Past Medical History:  Diagnosis Date  . Alcohol abuse   . Asthma   . Depression   . Hypertension     PAST SURGICAL HISTORY:   Past Surgical History:  Procedure Laterality Date  . FRACTURE SURGERY     Left leg as a kid    SOCIAL HISTORY:   Social History   Tobacco Use  . Smoking status: Current Every Day Smoker    Packs/day: 1.00    Types: Cigarettes  . Smokeless tobacco: Current User    Types: Snuff  . Tobacco comment: Does pouches as welll   Substance Use Topics  . Alcohol use: Yes    Alcohol/week: 7.0 standard drinks    Types: 7 Cans of beer per week    FAMILY HISTORY:   Family History  Problem Relation Age of Onset  . Colon cancer Neg Hx   . Esophageal cancer Neg Hx     DRUG ALLERGIES:   Allergies  Allergen Reactions  . Lisinopril Swelling    Angioedema   . Shellfish Allergy Swelling    REVIEW OF SYSTEMS:   ROS As per history of present illness. All pertinent systems were reviewed above. Constitutional, HEENT, cardiovascular, respiratory, GI, GU, musculoskeletal, neuro, psychiatric, endocrine, integumentary and hematologic systems were reviewed and are otherwise negative/unremarkable except for positive  findings mentioned above in the HPI.   MEDICATIONS AT HOME:   Prior to Admission medications   Medication Sig Start Date End Date Taking? Authorizing Provider  amLODipine (NORVASC) 10 MG tablet Take 1 tablet (10 mg total) by mouth daily. 04/03/20 09/16/20 Yes Charise Killian, MD  atorvastatin (LIPITOR) 40 MG tablet Take 1 tablet (40 mg total) by mouth daily. 09/18/20  Yes Marrion Coy, MD  carvedilol (COREG) 12.5 MG tablet TAKE ONE TABLET BY MOUTH 2 TIMES A DAY WITH A MEAL 09/18/20 09/18/21 Yes Marrion Coy, MD  furosemide (LASIX) 40 MG tablet Take 1 tablet (40 mg total) by mouth daily. 12/11/20 12/11/21 Yes Gilles Chiquito, MD  pantoprazole (PROTONIX) 40 MG  tablet TAKE ONE TABLET BY MOUTH EVERY DAY 09/18/20 09/18/21 Yes Marrion Coy, MD  potassium chloride (KLOR-CON) 10 MEQ tablet Take 2 tablets (20 mEq total) by mouth daily. 12/11/20 01/10/21 Yes Gilles Chiquito, MD  spironolactone (ALDACTONE) 25 MG tablet Take 1 tablet (25 mg total) by mouth daily. 09/19/20  Yes Marrion Coy, MD  thiamine (VITAMIN B-1) 100 MG tablet TAKE ONE TABLET BY MOUTH EVERY DAY FOR 14 DAYS 09/18/20 09/18/21 Yes Marrion Coy, MD  HYDROcodone-acetaminophen (NORCO) 5-325 MG tablet Take 1 tablet by mouth every 6 (six) hours as needed for moderate pain. Patient not taking: No sig reported 11/29/20   Irean Hong, MD  ibuprofen (ADVIL) 800 MG tablet Take 1 tablet (800 mg total) by mouth every 8 (eight) hours as needed for moderate pain. Patient not taking: No sig reported 11/29/20   Irean Hong, MD  mirtazapine (REMERON) 15 MG tablet Take 1 tablet (15 mg total) by mouth at bedtime. Patient not taking: Reported on 02/19/2021 02/23/20   [name], DO  nicotine (NICODERM CQ - DOSED IN MG/24 HOURS) 21 mg/24hr patch Place 1 patch (21 mg total) onto the skin daily. Patient not taking: No sig reported 09/18/20   Marrion Coy, MD  sertraline (ZOLOFT) 100 MG tablet Take 1 tablet (100 mg total) by mouth daily in the afternoon. 04/03/20 05/03/20  Charise Killian, MD      VITAL SIGNS:  Blood pressure 126/84, pulse 94, temperature 98 F (36.7 C), temperature source Oral, resp. rate 16, height 6\' 3"  (1.905 m), weight 83.9 kg, SpO2 98 %.  PHYSICAL EXAMINATION:  Physical Exam  GENERAL:  40 y.o.-year-old African-American male patient lying in the bed with no acute distress.  EYES: Pupils equal, round, reactive to light and accommodation. No scleral icterus. Extraocular muscles intact.  HEENT: Head atraumatic, normocephalic. Oropharynx and nasopharynx clear.  NECK:  Supple, no jugular venous distention. No thyroid enlargement, no tenderness.  LUNGS: Normal breath sounds bilaterally, no  wheezing, rales,rhonchi or crepitation. No use of accessory muscles of respiration.  CARDIOVASCULAR: Regular rate and rhythm, S1, S2 normal. No murmurs, rubs, or gallops.  ABDOMEN: Soft, nondistended, with epigastric tenderness without others guarding or rigidity. Bowel sounds present. No organomegaly or mass.  EXTREMITIES: No pedal edema, cyanosis, or clubbing.  NEUROLOGIC: Cranial nerves II through XII are intact. Muscle strength 5/5 in all extremities. Sensation intact. Gait not checked.  PSYCHIATRIC: The patient is alert and oriented x 3.  Normal affect and good eye contact. SKIN: No obvious rash, lesion, or ulcer.   LABORATORY PANEL:   CBC Recent Labs  Lab 02/19/21 1820  WBC 6.9  HGB 14.3  HCT 39.9  PLT 188   ------------------------------------------------------------------------------------------------------------------  Chemistries  Recent Labs  Lab 02/19/21 1820 02/19/21 2015  NA 135  --   K 4.3  --   CL 99  --   CO2 17*  --   GLUCOSE 83  --   BUN 13  --   CREATININE 1.09  --   CALCIUM 8.9  --   AST  --  57*  ALT  --  34  ALKPHOS  --  82  BILITOT  --  1.6*   ------------------------------------------------------------------------------------------------------------------  Cardiac Enzymes No results for input(s): TROPONINI in the last 168 hours. ------------------------------------------------------------------------------------------------------------------  RADIOLOGY:  DG Chest 2 View  Result Date: 02/19/2021 CLINICAL DATA:  40 year old male with chest pain. EXAM: CHEST - 2 VIEW COMPARISON:  Chest radiograph dated 01/27/2021. FINDINGS: The lungs are clear. There is no pleural effusion pneumothorax. Top-normal cardiac silhouette similar to prior radiograph. No acute osseous pathology. IMPRESSION: No active cardiopulmonary disease. Electronically Signed   By: Elgie Collard M.D.   On: 02/19/2021 19:03   CT Abdomen Pelvis W Contrast  Result Date:  02/19/2021 CLINICAL DATA:  Epigastric pain for several days EXAM: CT ABDOMEN AND PELVIS WITH CONTRAST TECHNIQUE: Multidetector CT imaging of the abdomen and pelvis was performed using the standard protocol following bolus administration of intravenous contrast. CONTRAST:  OMNIPAQUE IOHEXOL 300 MG/ML  SOLN COMPARISON:  12/11/2020 FINDINGS: Lower chest: No acute abnormality. Hepatobiliary: Fatty infiltration of the liver is noted. The gallbladder is within normal limits. Pancreas: Pancreas is well visualized. Some very minimal peripancreatic inflammatory change is noted which may represent very early pancreatitis. No phlegmon is noted. Spleen: Normal in size without focal abnormality. Adrenals/Urinary Tract: Adrenal glands are nodular in appearance but stable. Kidneys demonstrate a normal enhancement pattern bilaterally. No renal calculi or obstructive changes are seen. Scattered small cysts are noted in the left kidney. The ureters are within normal limits. Bladder is well distended. Stomach/Bowel: The appendix is within normal limits. No obstructive or inflammatory changes of the colon are seen. Small bowel and stomach are within normal limits. Vascular/Lymphatic: No significant vascular findings are present. No enlarged abdominal or pelvic lymph nodes. Reproductive: Prostate is unremarkable. Other: No abdominal wall hernia or abnormality. No abdominopelvic ascites. Musculoskeletal: No acute or significant osseous findings. IMPRESSION: Changes suggestive of very early pancreatitis. No phlegmon or pseudocyst is noted. Fatty liver. No other focal abnormality is noted. Electronically Signed   By: Alcide Clever M.D.   On: 02/19/2021 21:36      IMPRESSION AND PLAN:  Active Problems:   Recurrent pancreatitis  1.  Recurrent acute alcoholic pancreatitis, shown on abdominal CT with normal lipase. - The patient will be admitted to a medical bed. - Pain management will be provided.  IV Toradol and morphine  sulfate. - The patient will be kept NPO. - We will follow serial lipase levels.  2.  Early alcohol withdrawal with history of alcohol abuse. - The patient will be placed on as needed IV Ativan. - We will place him on a banana bag daily. - He was counseled for cessation.  3.  Essential hypertension. - We will continue Norvasc and Coreg.  4.  Dyslipidemia. - We will continue statin therapy.  5.  Depression. - We will continue Zoloft.  6.  GERD. - We will continue PPI therapy.  DVT prophylaxis: Lovenox. Code Status: full code. Family Communication:  The plan of care was discussed in details with the patient (and his girlfriend who was with him). I answered all questions. The patient agreed to proceed with the above mentioned plan. Further management will depend  upon hospital course. Disposition Plan: Back to previous home environment Consults called: none. All the records are reviewed and case discussed with ED provider.  Status is: Inpatient  Remains inpatient appropriate because:Ongoing active pain requiring inpatient pain management, Ongoing diagnostic testing needed not appropriate for outpatient work up, Unsafe d/c plan, IV treatments appropriate due to intensity of illness or inability to take PO and Inpatient level of care appropriate due to severity of illness   Dispo: The patient is from: Home              Anticipated d/c is to: Home              Patient currently is not medically stable to d/c.   Difficult to place patient No      TOTAL TIME TAKING CARE OF THIS PATIENT: 55 minutes.    Hannah Beat M.D on 02/19/2021 at 10:30 PM  [facility]   From 7 PM-7 AM, contact night-coverage [email or web address]  CC: Primary care physician; Pcp, No

## 2021-02-19 NOTE — ED Notes (Signed)
Pt reports mild improvement with pain but still having moderate pain at this time that he rates as 7/10

## 2021-02-19 NOTE — ED Notes (Signed)
Dr. Mansy at bedside. 

## 2021-02-20 DIAGNOSIS — F10930 Alcohol use, unspecified with withdrawal, uncomplicated: Secondary | ICD-10-CM

## 2021-02-20 DIAGNOSIS — I426 Alcoholic cardiomyopathy: Secondary | ICD-10-CM

## 2021-02-20 LAB — LIPASE, BLOOD: Lipase: 64 U/L — ABNORMAL HIGH (ref 11–51)

## 2021-02-20 LAB — COMPREHENSIVE METABOLIC PANEL
ALT: 32 U/L (ref 0–44)
AST: 49 U/L — ABNORMAL HIGH (ref 15–41)
Albumin: 3.8 g/dL (ref 3.5–5.0)
Alkaline Phosphatase: 85 U/L (ref 38–126)
Anion gap: 14 (ref 5–15)
BUN: 11 mg/dL (ref 6–20)
CO2: 25 mmol/L (ref 22–32)
Calcium: 8.6 mg/dL — ABNORMAL LOW (ref 8.9–10.3)
Chloride: 96 mmol/L — ABNORMAL LOW (ref 98–111)
Creatinine, Ser: 0.95 mg/dL (ref 0.61–1.24)
GFR, Estimated: >60 mL/min (ref >60)
Glucose, Bld: 111 mg/dL — ABNORMAL HIGH (ref 70–99)
Potassium: 3.5 mmol/L (ref 3.5–5.1)
Sodium: 135 mmol/L (ref 135–145)
Total Bilirubin: 2 mg/dL — ABNORMAL HIGH (ref 0.3–1.2)
Total Protein: 7 g/dL (ref 6.5–8.1)

## 2021-02-20 LAB — CBC
HCT: 38.4 % — ABNORMAL LOW (ref 39.0–52.0)
Hemoglobin: 13.8 g/dL (ref 13.0–17.0)
MCH: 28.7 pg (ref 26.0–34.0)
MCHC: 35.9 g/dL (ref 30.0–36.0)
MCV: 79.8 fL — ABNORMAL LOW (ref 80.0–100.0)
Platelets: 196 10*3/uL (ref 150–400)
RBC: 4.81 MIL/uL (ref 4.22–5.81)
RDW: 13.8 % (ref 11.5–15.5)
WBC: 6.9 10*3/uL (ref 4.0–10.5)
nRBC: 0 % (ref 0.0–0.2)

## 2021-02-20 LAB — PHOSPHORUS: Phosphorus: 3.5 mg/dL (ref 2.5–4.6)

## 2021-02-20 LAB — HIV ANTIBODY (ROUTINE TESTING W REFLEX): HIV Screen 4th Generation wRfx: NONREACTIVE

## 2021-02-20 MED ORDER — LORAZEPAM 2 MG/ML IJ SOLN
0.0000 mg | Freq: Four times a day (QID) | INTRAMUSCULAR | Status: DC
  Administered 2021-02-21: 1 mg via INTRAVENOUS
  Filled 2021-02-20 (×2): qty 1

## 2021-02-20 MED ORDER — FOLIC ACID 1 MG PO TABS
1.0000 mg | ORAL_TABLET | Freq: Every day | ORAL | Status: DC
  Administered 2021-02-20 – 2021-02-21 (×2): 1 mg via ORAL
  Filled 2021-02-20 (×2): qty 1

## 2021-02-20 MED ORDER — NICOTINE 21 MG/24HR TD PT24
21.0000 mg | MEDICATED_PATCH | Freq: Every day | TRANSDERMAL | Status: AC
  Administered 2021-02-20 – 2021-02-21 (×2): 21 mg via TRANSDERMAL
  Filled 2021-02-20 (×2): qty 1

## 2021-02-20 MED ORDER — HYDROMORPHONE BOLUS VIA INFUSION
0.5000 mg | INTRAVENOUS | Status: DC | PRN
  Filled 2021-02-20: qty 1

## 2021-02-20 MED ORDER — ADULT MULTIVITAMIN W/MINERALS CH
1.0000 | ORAL_TABLET | Freq: Every day | ORAL | Status: DC
  Administered 2021-02-20 – 2021-02-21 (×2): 1 via ORAL
  Filled 2021-02-20 (×2): qty 1

## 2021-02-20 MED ORDER — HYDROMORPHONE HCL 1 MG/ML IJ SOLN
0.5000 mg | INTRAMUSCULAR | Status: DC | PRN
  Administered 2021-02-20 – 2021-02-21 (×7): 0.5 mg via INTRAVENOUS
  Filled 2021-02-20 (×7): qty 0.5

## 2021-02-20 MED ORDER — THIAMINE HCL 100 MG PO TABS
100.0000 mg | ORAL_TABLET | Freq: Every day | ORAL | Status: DC
  Administered 2021-02-21: 100 mg via ORAL
  Filled 2021-02-20: qty 1

## 2021-02-20 MED ORDER — LORAZEPAM 2 MG/ML IJ SOLN
2.0000 mg | Freq: Once | INTRAMUSCULAR | Status: AC
  Administered 2021-02-20: 2 mg via INTRAVENOUS
  Filled 2021-02-20: qty 1

## 2021-02-20 MED ORDER — THIAMINE HCL 100 MG/ML IJ SOLN: 100.0000 mg | Freq: Every day | INTRAMUSCULAR | Status: DC

## 2021-02-20 MED ORDER — LORAZEPAM 2 MG/ML IJ SOLN: 0.0000 mg | Freq: Two times a day (BID) | INTRAMUSCULAR | Status: DC
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
## 2021-02-20 NOTE — Progress Notes (Signed)
BP continues elevated. BP meds given as well as pain med. MD Allena Katz made aware. Orders for CIWA now in place.
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
## 2021-02-20 NOTE — Progress Notes (Signed)
Initial Nutrition Assessment  DOCUMENTATION CODES:   Not applicable  INTERVENTION:   Monitor for diet advancement, po intake. If unable to advance diet, need to consider NG placement with initiation of TF  Continue MVI with Minerals, Thiamine   NUTRITION DIAGNOSIS:   Inadequate oral intake related to altered GI function,acute illness as evidenced by NPO status.  GOAL:   Patient will meet greater than or equal to 90% of their needs  MONITOR:   Diet advancement,PO intake,Weight trends  REASON FOR ASSESSMENT:   Malnutrition Screening Tool    ASSESSMENT:   40 yo male admitted with N/V x 1 day with recurrent acute pancreatitis, early EtOH withdrawl. PMH includes EtOH abuse-drinks beer and liquor on daily basis, asthma, depression, HTN   Currently NPO. Unable to reach pt via phone. Per MD not, pt sleeping hard and not easily arousable.   Current wt 87 kg; no wt loss per weight encounters  Labs: reviewed Meds: NS at 75 ml/hr, folic acid, MVI with Minerals, KCl, thiamine    Diet Order:   Diet Order            Diet NPO time specified  Diet effective now                 EDUCATION NEEDS:   Not appropriate for education at this time  Skin:  Skin Assessment: Reviewed RN Assessment  Last BM:  5/6  Height:   Ht Readings from Last 1 Encounters:  02/19/21 6\' 3"  (1.905 m)    Weight:   Wt Readings from Last 1 Encounters:  02/19/21 87.4 kg    BMI:  Body mass index is 24.08 kg/m.  Estimated Nutritional Needs:   Kcal:  2200-2400 kcals  Protein:  110-130 g  Fluid:  >/= 2 L   04/21/21 MS, RDN, LDN, CNSC Registered Dietitian III Clinical Nutrition RD Pager and On-Call Pager Number Located in [place]
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
## 2021-02-21 LAB — LIPASE, BLOOD: Lipase: 50 U/L (ref 11–51)

## 2021-02-21 MED ORDER — OXYCODONE HCL 5 MG PO TABS: 5.0000 mg | ORAL_TABLET | ORAL | Status: DC | PRN

## 2021-02-21 MED ORDER — NICOTINE 21 MG/24HR TD PT24: 21.0000 mg | MEDICATED_PATCH | Freq: Every day | TRANSDERMAL | Status: DC

## 2021-02-21 MED ORDER — OXYCODONE HCL 5 MG PO TABS
5.0000 mg | ORAL_TABLET | ORAL | Status: DC | PRN
  Administered 2021-02-21: 5 mg via ORAL
  Filled 2021-02-21: qty 1

## 2021-02-21 NOTE — Progress Notes (Signed)
Patient leaving AMA. Physician has been notified. Patient IV removed and AMA form signed and placed in chart.  Madie Reno, RN

## 2021-02-21 NOTE — Plan of Care (Signed)
  Problem: Activity: Goal: Risk for activity intolerance will decrease Outcome: Progressing   Problem: Nutrition: Goal: Adequate nutrition will be maintained Outcome: Progressing   Problem: Coping: Goal: Level of anxiety will decrease Outcome: Progressing   Problem: Pain Managment: Goal: General experience of comfort will improve Outcome: Progressing   Problem: Safety: Goal: Ability to remain free from injury will improve Outcome: Progressing   

## 2021-02-23 NOTE — Discharge Summary (Signed)
Pt admitted on 02/19/2021 with Alcohol withdrawal and Alcoholic pancreatitis. Pt left AMA on 02/21/2021.  Pt has been non compliant with meds and f/u. He has done this in the past.

## 2021-03-01 ENCOUNTER — Ambulatory Visit: Payer: Self-pay | Admitting: Cardiovascular Disease

## 2021-05-04 ENCOUNTER — Other Ambulatory Visit: Payer: Self-pay

## 2021-05-04 ENCOUNTER — Emergency Department: Payer: Self-pay

## 2021-05-04 ENCOUNTER — Emergency Department
Admission: EM | Admit: 2021-05-04 | Discharge: 2021-05-05 | Disposition: A | Discharge status: Home / Self Care | Payer: Self-pay | Attending: Emergency Medicine | Admitting: Emergency Medicine

## 2021-05-04 DIAGNOSIS — K219 Gastro-esophageal reflux disease without esophagitis: Secondary | ICD-10-CM | POA: Insufficient documentation

## 2021-05-04 DIAGNOSIS — F1010 Alcohol abuse, uncomplicated: Secondary | ICD-10-CM | POA: Insufficient documentation

## 2021-05-04 DIAGNOSIS — J45909 Unspecified asthma, uncomplicated: Secondary | ICD-10-CM | POA: Insufficient documentation

## 2021-05-04 DIAGNOSIS — N179 Acute kidney failure, unspecified: Secondary | ICD-10-CM | POA: Insufficient documentation

## 2021-05-04 DIAGNOSIS — K2971 Gastritis, unspecified, with bleeding: Secondary | ICD-10-CM | POA: Insufficient documentation

## 2021-05-04 DIAGNOSIS — I5022 Chronic systolic (congestive) heart failure: Secondary | ICD-10-CM | POA: Insufficient documentation

## 2021-05-04 DIAGNOSIS — F17210 Nicotine dependence, cigarettes, uncomplicated: Secondary | ICD-10-CM | POA: Insufficient documentation

## 2021-05-04 DIAGNOSIS — Z79899 Other long term (current) drug therapy: Secondary | ICD-10-CM | POA: Insufficient documentation

## 2021-05-04 DIAGNOSIS — I110 Hypertensive heart disease with heart failure: Secondary | ICD-10-CM | POA: Insufficient documentation

## 2021-05-04 DIAGNOSIS — K2900 Acute gastritis without bleeding: Secondary | ICD-10-CM

## 2021-05-04 DIAGNOSIS — K852 Alcohol induced acute pancreatitis without necrosis or infection: Secondary | ICD-10-CM

## 2021-05-04 DIAGNOSIS — Z20822 Contact with and (suspected) exposure to covid-19: Secondary | ICD-10-CM | POA: Insufficient documentation

## 2021-05-04 LAB — URINALYSIS, COMPLETE (UACMP) WITH MICROSCOPIC
Bacteria, UA: NONE SEEN
Bilirubin Urine: NEGATIVE
Glucose, UA: NEGATIVE mg/dL
Hgb urine dipstick: NEGATIVE
Ketones, ur: NEGATIVE mg/dL
Leukocytes,Ua: NEGATIVE
Nitrite: NEGATIVE
Protein, ur: 30 mg/dL — AB
Specific Gravity, Urine: >1.046 — ABNORMAL HIGH (ref 1.005–1.030)
pH: 5 (ref 5.0–8.0)

## 2021-05-04 LAB — COMPREHENSIVE METABOLIC PANEL
ALT: 45 U/L — ABNORMAL HIGH (ref 0–44)
AST: 82 U/L — ABNORMAL HIGH (ref 15–41)
Albumin: 3.8 g/dL (ref 3.5–5.0)
Alkaline Phosphatase: 120 U/L (ref 38–126)
Anion gap: 16 — ABNORMAL HIGH (ref 5–15)
BUN: 23 mg/dL — ABNORMAL HIGH (ref 6–20)
CO2: 23 mmol/L (ref 22–32)
Calcium: 9.1 mg/dL (ref 8.9–10.3)
Chloride: 101 mmol/L (ref 98–111)
Creatinine, Ser: 1.77 mg/dL — ABNORMAL HIGH (ref 0.61–1.24)
GFR, Estimated: 50 mL/min — ABNORMAL LOW (ref >60)
Glucose, Bld: 116 mg/dL — ABNORMAL HIGH (ref 70–99)
Potassium: 4.3 mmol/L (ref 3.5–5.1)
Sodium: 140 mmol/L (ref 135–145)
Total Bilirubin: 1.5 mg/dL — ABNORMAL HIGH (ref 0.3–1.2)
Total Protein: 7.4 g/dL (ref 6.5–8.1)

## 2021-05-04 LAB — CBC
HCT: 37.3 % — ABNORMAL LOW (ref 39.0–52.0)
Hemoglobin: 13.4 g/dL (ref 13.0–17.0)
MCH: 29.6 pg (ref 26.0–34.0)
MCHC: 35.9 g/dL (ref 30.0–36.0)
MCV: 82.5 fL (ref 80.0–100.0)
Platelets: 182 10*3/uL (ref 150–400)
RBC: 4.52 MIL/uL (ref 4.22–5.81)
RDW: 14.6 % (ref 11.5–15.5)
WBC: 6.1 10*3/uL (ref 4.0–10.5)
nRBC: 0 % (ref 0.0–0.2)

## 2021-05-04 LAB — RESP PANEL BY RT-PCR (FLU A&B, COVID) ARPGX2
Influenza A by PCR: NEGATIVE
Influenza B by PCR: NEGATIVE
SARS Coronavirus 2 by RT PCR: NEGATIVE

## 2021-05-04 LAB — TROPONIN I (HIGH SENSITIVITY): Troponin I (High Sensitivity): 57 ng/L — ABNORMAL HIGH (ref <18)

## 2021-05-04 LAB — LIPASE, BLOOD: Lipase: 76 U/L — ABNORMAL HIGH (ref 11–51)

## 2021-05-04 LAB — MAGNESIUM: Magnesium: 1.9 mg/dL (ref 1.7–2.4)

## 2021-05-04 MED ORDER — THIAMINE HCL 100 MG PO TABS
100.0000 mg | ORAL_TABLET | Freq: Every day | ORAL | Status: DC
  Administered 2021-05-04: 100 mg via ORAL
  Filled 2021-05-04: qty 1

## 2021-05-04 MED ORDER — ALUM & MAG HYDROXIDE-SIMETH 200-200-20 MG/5ML PO SUSP
30.0000 mL | Freq: Once | ORAL | Status: AC
  Administered 2021-05-04: 30 mL via ORAL
  Filled 2021-05-04: qty 30

## 2021-05-04 MED ORDER — ONDANSETRON HCL 4 MG/2ML IJ SOLN
4.0000 mg | Freq: Once | INTRAMUSCULAR | Status: AC
  Administered 2021-05-04: 4 mg via INTRAVENOUS
  Filled 2021-05-04: qty 2

## 2021-05-04 MED ORDER — HYDROMORPHONE HCL 1 MG/ML IJ SOLN
1.0000 mg | Freq: Once | INTRAMUSCULAR | Status: AC
  Administered 2021-05-04: 1 mg via INTRAVENOUS
  Filled 2021-05-04: qty 1

## 2021-05-04 MED ORDER — IOHEXOL 300 MG/ML  SOLN
100.0000 mL | Freq: Once | INTRAMUSCULAR | Status: AC | PRN
  Administered 2021-05-04: 100 mL via INTRAVENOUS
  Filled 2021-05-04: qty 100

## 2021-05-04 MED ORDER — LORAZEPAM 2 MG/ML IJ SOLN: 0.0000 mg | Freq: Two times a day (BID) | INTRAMUSCULAR | Status: DC

## 2021-05-04 MED ORDER — LORAZEPAM 1 MG PO TABS
0.0000 mg | ORAL_TABLET | Freq: Four times a day (QID) | ORAL | Status: DC
  Administered 2021-05-04: 1 mg via ORAL
  Filled 2021-05-04: qty 1

## 2021-05-04 MED ORDER — SODIUM CHLORIDE 0.9 % IV BOLUS
1000.0000 mL | Freq: Once | INTRAVENOUS | Status: AC
  Administered 2021-05-04: 1000 mL via INTRAVENOUS

## 2021-05-04 MED ORDER — SUCRALFATE 1 G PO TABS
1.0000 g | ORAL_TABLET | Freq: Once | ORAL | Status: AC
  Administered 2021-05-04: 1 g via ORAL
  Filled 2021-05-04: qty 1

## 2021-05-04 MED ORDER — LORAZEPAM 1 MG PO TABS: 0.0000 mg | ORAL_TABLET | Freq: Two times a day (BID) | ORAL | Status: DC

## 2021-05-04 MED ORDER — LACTATED RINGERS IV BOLUS
1000.0000 mL | Freq: Once | INTRAVENOUS | Status: AC
  Administered 2021-05-04: 1000 mL via INTRAVENOUS

## 2021-05-04 MED ORDER — PANTOPRAZOLE SODIUM 40 MG PO TBEC
40.0000 mg | DELAYED_RELEASE_TABLET | Freq: Once | ORAL | Status: AC
  Administered 2021-05-04: 40 mg via ORAL
  Filled 2021-05-04: qty 1

## 2021-05-04 MED ORDER — THIAMINE HCL 100 MG/ML IJ SOLN
100.0000 mg | Freq: Every day | INTRAMUSCULAR | Status: DC
  Administered 2021-05-04: 100 mg via INTRAVENOUS
  Filled 2021-05-04: qty 2

## 2021-05-04 MED ORDER — ACETAMINOPHEN 500 MG PO TABS
1000.0000 mg | ORAL_TABLET | Freq: Once | ORAL | Status: AC
  Administered 2021-05-04: 1000 mg via ORAL
  Filled 2021-05-04: qty 2

## 2021-05-04 MED ORDER — LORAZEPAM 2 MG/ML IJ SOLN: 0.0000 mg | Freq: Four times a day (QID) | INTRAMUSCULAR | Status: DC

## 2021-05-04 NOTE — ED Provider Notes (Signed)
[facility] Emergency Department Provider Note  ____________________________________________   Event Date/Time   First MD Initiated Contact with Patient 05/04/21 2011     (approximate)  I have reviewed the triage vital signs and the nursing notes.   HISTORY  Chief Complaint Abdominal Pain   HPI Mario Beck is a 40 y.o. male with a past medical history of asthma, depression, HTN and alcohol abuse with alcohol associated pancreatitis admission 02/21/2021 who presents for assessment of some epigastric abdominal pain associate with nausea and diarrhea that began last week but has been getting much worse over the last 3 to 4 days.  Patient states he has been drinking and last had alcohol yesterday.  States he typically will drink half a pint of liquor per day but more on the weekends.  States he feels the pain is radiating to his back and into his chest.  He endorses little bit of a cough denies any blood in his urine or stool.  No recent injuries or falls or other illicit drug use.  He has been taking ibuprofen but this does not help much.  He denies any earache, sore throat, fevers, rash or extremity pain.  States he feels that this is likely his pancreas acting up again.  He denies any other acute concerns at this time.  Denies any significant Tylenol use stating he only takes it about once a day help with sleep.         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   Chronic systolic CHF (congestive heart failure) (HCC) 09/17/2020   Alcoholic cardiomyopathy (HCC) 48/18/5631   Alcoholic pancreatitis 09/16/2020   Tobacco abuse 09/16/2020   Abnormal EKG 09/16/2020   GERD (gastroesophageal reflux disease) 09/16/2020   Depression 09/16/2020   Dark stools  09/16/2020   Elevated troponin 09/16/2020   Transaminitis 03/31/2020   Nausea and vomiting 02/21/2020   Intractable nausea and vomiting 02/21/2020   Tobacco abuse counseling    Acute alcoholic pancreatitis 02/18/2020   HTN (hypertension), malignant 02/18/2020   Alcohol use disorder, moderate, dependence (HCC) 02/18/2020   Acute pancreatitis 02/18/2020    Past Surgical History:  Procedure Laterality Date   FRACTURE SURGERY     Left leg as a kid    Prior to Admission medications   Medication Sig Start Date End Date Taking? Authorizing Provider  amLODipine (NORVASC) 10 MG tablet Take 1 tablet (10 mg total) by mouth daily. 04/03/20 09/16/20  Charise Killian, MD  atorvastatin (LIPITOR) 40 MG tablet Take 1 tablet (40 mg total) by mouth daily. 09/18/20   Marrion Coy, MD  carvedilol (COREG) 12.5 MG tablet TAKE ONE TABLET BY MOUTH 2 TIMES A DAY WITH A MEAL 09/18/20 09/18/21  Marrion Coy, MD  furosemide (LASIX) 40 MG tablet Take 1 tablet (40 mg total) by mouth daily. 12/11/20 12/11/21  Gilles Chiquito, MD  HYDROcodone-acetaminophen (NORCO) 5-325 MG tablet Take 1 tablet by mouth every 6 (six) hours as needed for moderate pain. Patient not taking: No sig reported 11/29/20   Irean Hong, MD  ibuprofen (ADVIL) 800 MG tablet Take 1 tablet (800 mg total) by mouth every 8 (eight) hours as needed for moderate pain. Patient not taking: No sig reported 11/29/20  Irean Hong, MD  mirtazapine (REMERON) 15 MG tablet Take 1 tablet (15 mg total) by mouth at bedtime. Patient not taking: Reported on 02/19/2021 02/23/20   [name], DO  nicotine (NICODERM CQ - DOSED IN MG/24 HOURS) 21 mg/24hr patch Place 1 patch (21 mg total) onto the skin daily. Patient not taking: No sig reported 09/18/20   Marrion Coy, MD  pantoprazole (PROTONIX) 40 MG tablet TAKE ONE TABLET BY MOUTH EVERY DAY 09/18/20 09/18/21  Marrion Coy, MD  potassium chloride (KLOR-CON) 10 MEQ tablet Take 2 tablets (20 mEq total) by mouth daily.  12/11/20 01/10/21  Gilles Chiquito, MD  sertraline (ZOLOFT) 100 MG tablet Take 1 tablet (100 mg total) by mouth daily in the afternoon. 04/03/20 05/03/20  Charise Killian, MD  spironolactone (ALDACTONE) 25 MG tablet Take 1 tablet (25 mg total) by mouth daily. 09/19/20   Marrion Coy, MD  thiamine (VITAMIN B-1) 100 MG tablet TAKE ONE TABLET BY MOUTH EVERY DAY FOR 14 DAYS 09/18/20 09/18/21  Marrion Coy, MD    Allergies Lisinopril and Shellfish allergy  Family History  Problem Relation Age of Onset   Colon cancer Neg Hx    Esophageal cancer Neg Hx     Social History Social History   Tobacco Use   Smoking status: Every Day    Packs/day: 1.00    Types: Cigarettes   Smokeless tobacco: Current    Types: Snuff   Tobacco comments:    Does pouches as welll   Vaping Use   Vaping Use: Never used  Substance Use Topics   Alcohol use: Yes    Alcohol/week: 7.0 standard drinks    Types: 7 Cans of beer per week   Drug use: No    Review of Systems  Review of Systems  Constitutional:  Negative for chills and fever.  HENT:  Negative for sore throat.   Eyes:  Negative for pain.  Respiratory:  Positive for cough. Negative for stridor.   Cardiovascular:  Positive for chest pain (abdominal pain radiating up into chest).  Gastrointestinal:  Positive for abdominal pain, diarrhea and nausea.  Genitourinary:  Negative for dysuria.  Musculoskeletal:  Negative for myalgias.  Skin:  Negative for rash.  Neurological:  Negative for seizures, loss of consciousness and headaches.  Psychiatric/Behavioral:  Positive for substance abuse. Negative for suicidal ideas.   All other systems reviewed and are negative.    ____________________________________________   PHYSICAL EXAM:  VITAL SIGNS: ED Triage Vitals  Enc Vitals Group     BP 05/04/21 1827 (!) 128/103     Pulse Rate 05/04/21 1827 (!) 106     Resp 05/04/21 1827 18     Temp 05/04/21 1827 99 F (37.2 C)     Temp Source 05/04/21 1827 Oral      SpO2 05/04/21 1827 99 %     Weight 05/04/21 1828 185 lb (83.9 kg)     Height 05/04/21 1828 6\' 3"  (1.905 m)     Head Circumference --      Peak Flow --      Pain Score 05/04/21 1828 8     Pain Loc --      Pain Edu? --      Excl. in GC? --    Vitals:   05/04/21 1827  BP: (!) 128/103  Pulse: (!) 106  Resp: 18  Temp: 99 F (37.2 C)  SpO2: 99%   Physical Exam Vitals and nursing note reviewed.  Constitutional:  Appearance: He is well-developed.  HENT:     Head: Normocephalic and atraumatic.  Eyes:     Conjunctiva/sclera: Conjunctivae normal.  Cardiovascular:     Rate and Rhythm: Normal rate and regular rhythm.     Heart sounds: No murmur heard. Pulmonary:     Effort: Pulmonary effort is normal. No respiratory distress.     Breath sounds: Normal breath sounds.  Abdominal:     Palpations: Abdomen is soft.     Tenderness: There is abdominal tenderness in the epigastric area. There is no right CVA tenderness or left CVA tenderness.  Musculoskeletal:     Cervical back: Neck supple.  Skin:    General: Skin is warm and dry.  Neurological:     Mental Status: He is alert.     ____________________________________________   LABS (all labs ordered are listed, but only abnormal results are displayed)  Labs Reviewed  LIPASE, BLOOD - Abnormal; Notable for the following components:      Result Value   Lipase 76 (*)    All other components within normal limits  COMPREHENSIVE METABOLIC PANEL - Abnormal; Notable for the following components:   Glucose, Bld 116 (*)    BUN 23 (*)    Creatinine, Ser 1.77 (*)    AST 82 (*)    ALT 45 (*)    Total Bilirubin 1.5 (*)    GFR, Estimated 50 (*)    Anion gap 16 (*)    All other components within normal limits  TROPONIN I (HIGH SENSITIVITY) - Abnormal; Notable for the following components:   Troponin I (High Sensitivity) 57 (*)    All other components within normal limits  RESP PANEL BY RT-PCR (FLU A&B, COVID) ARPGX2  MAGNESIUM   URINALYSIS, COMPLETE (UACMP) WITH MICROSCOPIC  CBC   ____________________________________________  EKG  Sinus tachycardia with a rate of 104, unremarkable intervals, nonspecific ST changes in anterior and lateral leads with T wave inversions and ST depressions in lateral leads as well as ST depression in lead II and T wave inversion in aVF.  These changes appear laterally similar to prior ECG obtained on 5/6. ____________________________________________  RADIOLOGY  ED MD interpretation: Chest x-ray has no evidence of pneumonia, pneumothorax, effusion, edema, or other clear acute intrathoracic process.  CT abdomen pelvis has no findings to suggest acute pancreatitis, cholecystitis, appendicitis, diverticulitis.  There is evidence of some bladder wall thickening and hepatic steatosis as well as mild cardiomegaly.  No other clear acute abdominopelvic process.  Official radiology report(s): CT ABDOMEN PELVIS W CONTRAST  Result Date: 05/04/2021 CLINICAL DATA:  Nonlocalized acute abdominal pain. Fatigue. Nausea vomiting diarrhea. Possible pancreatitis. EXAM: CT ABDOMEN AND PELVIS WITH CONTRAST TECHNIQUE: Multidetector CT imaging of the abdomen and pelvis was performed using the standard protocol following bolus administration of intravenous contrast. CONTRAST:  100mL OMNIPAQUE IOHEXOL 300 MG/ML  SOLN COMPARISON:  None. FINDINGS: Lower chest: Interval decrease in size of a trace left pleural effusion. Partially visualized likely enlarged left ventricle. Hepatobiliary: The hepatic parenchyma is diffusely hypodense compared to the splenic parenchyma consistent with fatty infiltration. No focal liver abnormality. No gallstones, gallbladder wall thickening, or pericholecystic fluid. No biliary dilatation. Pancreas: No focal lesion. Normal pancreatic contour. No surrounding inflammatory changes. No main pancreatic ductal dilatation. Spleen: Normal in size without focal abnormality. Adrenals/Urinary Tract:  Slight hyperplasia of the left adrenal gland with no definite nodularity. No right adrenal nodule. Bilateral kidneys enhance symmetrically. Subcentimeter hypodensities are too small to characterize. No hydronephrosis. No hydroureter. The  urinary bladder is decompressed with circumferential urinary bladder wall thickening. Stomach/Bowel: Stomach is within normal limits. No evidence of bowel wall thickening or dilatation. Appendix appears normal. Vascular/Lymphatic: No abdominal aorta or iliac aneurysm. No abdominal, pelvic, or inguinal lymphadenopathy. Reproductive: Prostate is unremarkable. Other: No intraperitoneal free fluid. No intraperitoneal free gas. No organized fluid collection. Musculoskeletal: No abdominal wall hernia or abnormality. No suspicious lytic or blastic osseous lesions. No acute displaced fracture. IMPRESSION: 1. No definite CT findings of acute pancreatitis. 2. Urinary bladder wall thickening likely due to under distension. Correlate with urinalysis to exclude underlying infection. 3. Hepatic steatosis. 4. Cardiomegaly. Electronically Signed   By: Tish Frederickson M.D.   On: 05/04/2021 21:27   DG Chest Portable 1 View  Result Date: 05/04/2021 CLINICAL DATA:  Cough. EXAM: PORTABLE CHEST 1 VIEW COMPARISON:  Feb 19, 2021. FINDINGS: The heart size and mediastinal contours are within normal limits. Both lungs are clear. The visualized skeletal structures are unremarkable. IMPRESSION: No active disease. Electronically Signed   By: Lupita Raider M.D.   On: 05/04/2021 20:35    ____________________________________________   PROCEDURES  Procedure(s) performed (including Critical Care):  Procedures   ____________________________________________   INITIAL IMPRESSION / ASSESSMENT AND PLAN / ED COURSE      Patient presents with above-stated history exam for assessment of worsening epigastric abdominal pain associate with some nausea and diarrhea as well as cough and some chest pain  that he is not sure if it is from the abdomen or separate.  On arrival he is tachycardic with stable vital signs on room air.  He is quite tender in his epigastrium.  With regard to his cough and chest discomfort differential includes possible bronchitis, pneumonia, ACS versus referred to chest discomfort from pancreatitis.  ECG appears largely unchanged from prior and while troponin is elevated 57 this is decreased from I was last checked 2 months ago at 62 and 66.  I suspect some mild demand ischemia in the setting of dehydration.  We will plan to trend this in the for anticoagulation at this time..  Chest x-ray has no evidence of pneumonia effusion or other clear acute intrathoracic process.  CT abdomen pelvis has no findings to suggest acute pancreatitis, cholecystitis, appendicitis, diverticulitis.  There is evidence of some bladder wall thickening and hepatic steatosis as well as mild cardiomegaly.  No other clear acute abdominopelvic process.  CMP shows evidence of an AKI with a creatinine of 1.77 compared to 0.952 months ago.  There is also mild transaminitis suspect related to his alcohol use but no other clear acute abdominopelvic process.  Magnesium is unremarkable.  Lipase is 76 and not greater than 3 times the upper limit of normal.  Lipase and CT are not suggestive of acute pancreatitis is certainly possible patient has some alcohol-related gastritis or peptic ulcer disease.  Given he reported some cough certainly possible he has developed a mild bronchitis.  We will plan to obtain a UA and repeat troponin.  On reassessment he states his pain and nausea is much improved and he is tolerating p.o.  If his troponin is stable or downtrending and his UA is otherwise reassuring I think he likely be stable for close outpatient follow-up.  Care patient signed over to Dr. Juliette Alcide at approximately 10 PM.  Plan is to follow-up repeat troponin,CBC, UA and reassess.      ____________________________________________   FINAL CLINICAL IMPRESSION(S) / ED DIAGNOSES  Final diagnoses:  AKI (acute kidney injury) (HCC)  Alcohol  abuse  Acute gastritis, presence of bleeding unspecified, unspecified gastritis type    Medications  LORazepam (ATIVAN) injection 0-4 mg ( Intravenous See Alternative 05/04/21 2053)    Or  LORazepam (ATIVAN) tablet 0-4 mg (1 mg Oral Given 05/04/21 2053)  LORazepam (ATIVAN) injection 0-4 mg (has no administration in time range)    Or  LORazepam (ATIVAN) tablet 0-4 mg (has no administration in time range)  thiamine tablet 100 mg (100 mg Oral Given 05/04/21 2055)    Or  thiamine (B-1) injection 100 mg ( Intravenous See Alternative 05/04/21 2055)  alum & mag hydroxide-simeth (MAALOX/MYLANTA) 200-200-20 MG/5ML suspension 30 mL (has no administration in time range)  sucralfate (CARAFATE) tablet 1 g (has no administration in time range)  pantoprazole (PROTONIX) EC tablet 40 mg (has no administration in time range)  lactated ringers bolus 1,000 mL (1,000 mLs Intravenous New Bag/Given 05/04/21 2056)  HYDROmorphone (DILAUDID) injection 1 mg (1 mg Intravenous Given 05/04/21 2047)  ondansetron (ZOFRAN) injection 4 mg (4 mg Intravenous Given 05/04/21 2045)  iohexol (OMNIPAQUE) 300 MG/ML solution 100 mL (100 mLs Intravenous Contrast Given 05/04/21 2110)  acetaminophen (TYLENOL) tablet 1,000 mg (1,000 mg Oral Given 05/04/21 2124)     ED Discharge Orders     None        Note:  This document was prepared using Dragon voice recognition software and may include unintentional dictation errors.    Gilles Chiquito, MD 05/04/21 2156

## 2021-05-04 NOTE — ED Provider Notes (Addendum)
labs are still pending I just signed the patient out to Dr. Dolores Frame.   Arnaldo Natal, MD 05/04/21 2324    Arnaldo Natal, MD 05/04/21 [phone]

## 2021-05-04 NOTE — ED Triage Notes (Signed)
Pt to ED POV for possible pancreatitis since Sunday. Reports fatigue, N/V/D Pt in NAD, ambulatory  Hx pancreatitis  Pt drinking soda in triage, advised NPO  Pt reports last drink yesterday, daily drinker

## 2021-05-04 NOTE — ED Notes (Signed)
RN noted that PO and IV thiamine given. ED provider, Dr. Katrinka Blazing notified and stated "you cannot overdose on it, it is fine."

## 2021-05-04 NOTE — ED Notes (Signed)
Pt states coming in for abdominal pain. Pt states he has pancreatitis that flares up due to alcohol. Pt states last drink was yesterday.

## 2021-05-04 NOTE — ED Notes (Signed)
Dr. Katrinka Blazing notified of the troponin of 57 and state it is normally elevated and was down from last time. Pt getting urine specimen in the bathroom at this time. Elizabeth notified of the elevated troponin and that pt is not currently on monitor. Lanora Manis stated she will place pt on a monitor.

## 2021-05-05 LAB — TROPONIN I (HIGH SENSITIVITY): Troponin I (High Sensitivity): 62 ng/L — ABNORMAL HIGH (ref <18)

## 2021-05-05 MED ORDER — LORAZEPAM 1 MG PO TABS
1.0000 mg | ORAL_TABLET | Freq: Once | ORAL | Status: AC
  Administered 2021-05-05: 1 mg via ORAL
  Filled 2021-05-05: qty 1

## 2021-05-05 MED ORDER — ONDANSETRON 4 MG PO TBDP: 4.0000 mg | ORAL_TABLET | Freq: Three times a day (TID) | ORAL | 0 refills | Status: DC | PRN

## 2021-05-05 MED ORDER — LORAZEPAM 1 MG PO TABS: 1.0000 mg | ORAL_TABLET | Freq: Three times a day (TID) | ORAL | 0 refills | Status: DC | PRN

## 2021-05-05 MED ORDER — OXYCODONE-ACETAMINOPHEN 5-325 MG PO TABS: 1.0000 | ORAL_TABLET | ORAL | 0 refills | Status: DC | PRN

## 2021-05-05 NOTE — ED Provider Notes (Signed)
-----------------------------------------   1:07 AM on 05/05/2021 -----------------------------------------  Repeat troponin not significantly changed.  Patient voices no complaints of chest pain or shortness of breath.  No emesis, overall feeling better.  Requesting small course of Ativan to help curb his EtOH habit.  We will also discharge home on limited quantity Percocet and Zofran and bland diet to help with pancreatitis.  Strict return precautions given.  Patient verbalizes understanding agrees with plan of care.   Irean Hong, MD 05/05/21 [phone]

## 2021-05-05 NOTE — ED Notes (Signed)
Patient ambulate to the restroom, steady gait noted

## 2021-05-05 NOTE — Discharge Instructions (Addendum)
1.  You may take Ativan 3 times daily as needed for withdrawal symptoms. 2.  You may take Percocet and Zofran as needed for pain and nausea. 3.  Clear liquids x12 hours, then bland diet x5 days, then slowly advance diet as tolerated. 4.  Return to the ER for worsening symptoms, persistent vomiting, difficulty breathing or other concerns.

## 2021-05-05 NOTE — ED Notes (Addendum)
Patient notified that he was given a "double dose" of thiamine (as patient received an IV dose and a PO dose as the pyxis allowed RN to pull both when this RN went to pull medications that were under the "due now" section). Patient educated about it and that the EDP was informed about the incident and as per the EDP Dr Katrinka Blazing "you cannot overdose on Thiamine." Pt informed of this. Patient did not voice any concerns or ask any further questions.

## 2021-05-26 ENCOUNTER — Encounter: Payer: Self-pay | Admitting: Emergency Medicine

## 2021-05-26 ENCOUNTER — Emergency Department: Payer: Self-pay

## 2021-05-26 ENCOUNTER — Other Ambulatory Visit: Payer: Self-pay

## 2021-05-26 ENCOUNTER — Emergency Department
Admission: EM | Admit: 2021-05-26 | Discharge: 2021-05-26 | Disposition: A | Discharge status: Home / Self Care | Payer: Self-pay | Attending: Emergency Medicine | Admitting: Emergency Medicine

## 2021-05-26 DIAGNOSIS — F17210 Nicotine dependence, cigarettes, uncomplicated: Secondary | ICD-10-CM | POA: Insufficient documentation

## 2021-05-26 DIAGNOSIS — I110 Hypertensive heart disease with heart failure: Secondary | ICD-10-CM | POA: Insufficient documentation

## 2021-05-26 DIAGNOSIS — K852 Alcohol induced acute pancreatitis without necrosis or infection: Secondary | ICD-10-CM | POA: Insufficient documentation

## 2021-05-26 DIAGNOSIS — R1033 Periumbilical pain: Secondary | ICD-10-CM

## 2021-05-26 DIAGNOSIS — Z79899 Other long term (current) drug therapy: Secondary | ICD-10-CM | POA: Insufficient documentation

## 2021-05-26 DIAGNOSIS — J45909 Unspecified asthma, uncomplicated: Secondary | ICD-10-CM | POA: Insufficient documentation

## 2021-05-26 DIAGNOSIS — I5022 Chronic systolic (congestive) heart failure: Secondary | ICD-10-CM | POA: Insufficient documentation

## 2021-05-26 DIAGNOSIS — F1010 Alcohol abuse, uncomplicated: Secondary | ICD-10-CM | POA: Insufficient documentation

## 2021-05-26 LAB — CBC
HCT: 39.8 % (ref 39.0–52.0)
Hemoglobin: 14.4 g/dL (ref 13.0–17.0)
MCH: 30.4 pg (ref 26.0–34.0)
MCHC: 36.2 g/dL — ABNORMAL HIGH (ref 30.0–36.0)
MCV: 84 fL (ref 80.0–100.0)
Platelets: 147 10*3/uL — ABNORMAL LOW (ref 150–400)
RBC: 4.74 MIL/uL (ref 4.22–5.81)
RDW: 14.7 % (ref 11.5–15.5)
WBC: 5.1 10*3/uL (ref 4.0–10.5)
nRBC: 0 % (ref 0.0–0.2)

## 2021-05-26 LAB — COMPREHENSIVE METABOLIC PANEL
ALT: 44 U/L (ref 0–44)
AST: 76 U/L — ABNORMAL HIGH (ref 15–41)
Albumin: 3.7 g/dL (ref 3.5–5.0)
Alkaline Phosphatase: 142 U/L — ABNORMAL HIGH (ref 38–126)
Anion gap: 18 — ABNORMAL HIGH (ref 5–15)
BUN: 22 mg/dL — ABNORMAL HIGH (ref 6–20)
CO2: 24 mmol/L (ref 22–32)
Calcium: 9 mg/dL (ref 8.9–10.3)
Chloride: 99 mmol/L (ref 98–111)
Creatinine, Ser: 1.19 mg/dL (ref 0.61–1.24)
GFR, Estimated: >60 mL/min (ref >60)
Glucose, Bld: 107 mg/dL — ABNORMAL HIGH (ref 70–99)
Potassium: 3.7 mmol/L (ref 3.5–5.1)
Sodium: 141 mmol/L (ref 135–145)
Total Bilirubin: 2 mg/dL — ABNORMAL HIGH (ref 0.3–1.2)
Total Protein: 7.2 g/dL (ref 6.5–8.1)

## 2021-05-26 LAB — LIPASE, BLOOD: Lipase: 78 U/L — ABNORMAL HIGH (ref 11–51)

## 2021-05-26 MED ORDER — ONDANSETRON HCL 4 MG/2ML IJ SOLN
4.0000 mg | Freq: Once | INTRAMUSCULAR | Status: AC
  Administered 2021-05-26: 4 mg via INTRAVENOUS
  Filled 2021-05-26: qty 2

## 2021-05-26 MED ORDER — ONDANSETRON 4 MG PO TBDP: 4.0000 mg | ORAL_TABLET | Freq: Three times a day (TID) | ORAL | 0 refills | Status: DC | PRN

## 2021-05-26 MED ORDER — KETOROLAC TROMETHAMINE 30 MG/ML IJ SOLN
15.0000 mg | Freq: Once | INTRAMUSCULAR | Status: AC
  Administered 2021-05-26: 15 mg via INTRAVENOUS
  Filled 2021-05-26: qty 1

## 2021-05-26 MED ORDER — OXYCODONE-ACETAMINOPHEN 5-325 MG PO TABS
1.0000 | ORAL_TABLET | ORAL | Status: AC | PRN
  Administered 2021-05-26 (×2): 1 via ORAL
  Filled 2021-05-26 (×2): qty 1

## 2021-05-26 MED ORDER — IOHEXOL 350 MG/ML SOLN
100.0000 mL | Freq: Once | INTRAVENOUS | Status: AC | PRN
  Administered 2021-05-26: 100 mL via INTRAVENOUS

## 2021-05-26 MED ORDER — OXYCODONE HCL 5 MG PO TABS: 5.0000 mg | ORAL_TABLET | Freq: Three times a day (TID) | ORAL | 0 refills | Status: DC | PRN

## 2021-05-26 MED ORDER — MORPHINE SULFATE (PF) 4 MG/ML IV SOLN
6.0000 mg | Freq: Once | INTRAVENOUS | Status: AC
  Administered 2021-05-26: 6 mg via INTRAVENOUS
  Filled 2021-05-26: qty 2

## 2021-05-26 MED ORDER — LACTATED RINGERS IV BOLUS
1000.0000 mL | Freq: Once | INTRAVENOUS | Status: AC
  Administered 2021-05-26: 1000 mL via INTRAVENOUS

## 2021-05-26 MED ORDER — ONDANSETRON 4 MG PO TBDP
4.0000 mg | ORAL_TABLET | Freq: Once | ORAL | Status: AC | PRN
  Administered 2021-05-26: 4 mg via ORAL
  Filled 2021-05-26: qty 1

## 2021-05-26 NOTE — ED Provider Notes (Addendum)
[facility] Emergency Department Provider Note ____________________________________________   Event Date/Time   First MD Initiated Contact with Patient 05/26/21 2117     (approximate)  I have reviewed the triage vital signs and the nursing notes.  HISTORY  Chief Complaint Abdominal Pain   HPI Mario Beck is a 40 y.o. malewho presents to the ED for evaluation of abdominal pain.  Chart review indicates history of alcoholism and associated acute pancreatitis in the past.  He was admitted here 3 months ago for this.  Patient presents to the ED, accompanied by his girlfriend, for evaluation of a couple days of generalized abdominal pain and poor p.o. intake.  He reports heavy drinking over this past weekend.  Reports "only a couple shots" for the past few days.  Currently reporting 8/10 intensity pain aching and periumbilical.  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   Chronic systolic CHF (congestive heart failure) (HCC) 09/17/2020   Alcoholic cardiomyopathy (HCC) 47/06/6282   Alcoholic pancreatitis 09/16/2020   Tobacco abuse 09/16/2020   Abnormal EKG 09/16/2020   GERD (gastroesophageal reflux disease) 09/16/2020   Depression 09/16/2020   Dark stools 09/16/2020   Elevated troponin 09/16/2020   Transaminitis 03/31/2020   Nausea and vomiting 02/21/2020   Intractable nausea and vomiting 02/21/2020   Tobacco abuse counseling    Acute alcoholic pancreatitis 02/18/2020   HTN (hypertension), malignant 02/18/2020   Alcohol use disorder, moderate, dependence (HCC) 02/18/2020   Acute pancreatitis 02/18/2020    Past Surgical History:  Procedure Laterality Date   FRACTURE SURGERY     Left leg as a kid    Prior to Admission medications    Medication Sig Start Date End Date Taking? Authorizing Provider  oxyCODONE (ROXICODONE) 5 MG immediate release tablet Take 1 tablet (5 mg total) by mouth every 8 (eight) hours as needed for severe pain or breakthrough pain. 05/26/21 05/26/22 Yes Delton Prairie, MD  amLODipine (NORVASC) 10 MG tablet Take 1 tablet (10 mg total) by mouth daily. 04/03/20 09/16/20  Charise Killian, MD  atorvastatin (LIPITOR) 40 MG tablet Take 1 tablet (40 mg total) by mouth daily. 09/18/20   Marrion Coy, MD  carvedilol (COREG) 12.5 MG tablet TAKE ONE TABLET BY MOUTH 2 TIMES A DAY WITH A MEAL 09/18/20 09/18/21  Marrion Coy, MD  furosemide (LASIX) 40 MG tablet Take 1 tablet (40 mg total) by mouth daily. 12/11/20 12/11/21  Gilles Chiquito, MD  HYDROcodone-acetaminophen (NORCO) 5-325 MG tablet Take 1 tablet by mouth every 6 (six) hours as needed for moderate pain. Patient not taking: No sig reported 11/29/20   Irean Hong, MD  ibuprofen (ADVIL) 800 MG tablet Take 1 tablet (800 mg total) by mouth every 8 (eight) hours as needed for moderate pain. Patient not taking: No sig reported 11/29/20   Irean Hong, MD  LORazepam (ATIVAN) 1 MG tablet Take 1 tablet (1 mg total) by mouth every 8 (eight) hours as needed for anxiety. 05/05/21   Irean Hong, MD  mirtazapine (REMERON) 15 MG tablet Take 1 tablet (15 mg total) by mouth at bedtime. Patient not taking: Reported on 02/19/2021 02/23/20   [name], DO  nicotine (NICODERM CQ - DOSED IN MG/24 HOURS) 21 mg/24hr patch  Place 1 patch (21 mg total) onto the skin daily. Patient not taking: No sig reported 09/18/20   Marrion Coy, MD  ondansetron (ZOFRAN ODT) 4 MG disintegrating tablet Take 1 tablet (4 mg total) by mouth every 8 (eight) hours as needed for nausea or vomiting. 05/05/21   Irean Hong, MD  ondansetron (ZOFRAN ODT) 4 MG disintegrating tablet Take 1 tablet (4 mg total) by mouth every 8 (eight) hours as needed for nausea or vomiting. 05/26/21   Delton Prairie, MD   oxyCODONE-acetaminophen (PERCOCET/ROXICET) 5-325 MG tablet Take 1 tablet by mouth every 4 (four) hours as needed for severe pain. 05/05/21   Irean Hong, MD  pantoprazole (PROTONIX) 40 MG tablet TAKE ONE TABLET BY MOUTH EVERY DAY 09/18/20 09/18/21  Marrion Coy, MD  potassium chloride (KLOR-CON) 10 MEQ tablet Take 2 tablets (20 mEq total) by mouth daily. 12/11/20 01/10/21  Gilles Chiquito, MD  sertraline (ZOLOFT) 100 MG tablet Take 1 tablet (100 mg total) by mouth daily in the afternoon. 04/03/20 05/03/20  Charise Killian, MD  spironolactone (ALDACTONE) 25 MG tablet Take 1 tablet (25 mg total) by mouth daily. 09/19/20   Marrion Coy, MD  thiamine (VITAMIN B-1) 100 MG tablet TAKE ONE TABLET BY MOUTH EVERY DAY FOR 14 DAYS 09/18/20 09/18/21  Marrion Coy, MD    Allergies Lisinopril and Shellfish allergy  Family History  Problem Relation Age of Onset   Colon cancer Neg Hx    Esophageal cancer Neg Hx     Social History Social History   Tobacco Use   Smoking status: Every Day    Packs/day: 1.00    Types: Cigarettes   Smokeless tobacco: Current    Types: Snuff   Tobacco comments:    Does pouches as welll   Vaping Use   Vaping Use: Never used  Substance Use Topics   Alcohol use: Yes    Alcohol/week: 7.0 standard drinks    Types: 7 Cans of beer per week   Drug use: No    Review of Systems  Constitutional: No fever/chills Eyes: No visual changes. ENT: No sore throat. Cardiovascular: Denies chest pain. Respiratory: Denies shortness of breath. Gastrointestinal: Positive for abdominal pain and nausea  no vomiting.  No diarrhea.  No constipation. Genitourinary: Negative for dysuria. Musculoskeletal: Negative for back pain. Skin: Negative for rash. Neurological: Negative for headaches, focal weakness or numbness.  ____________________________________________   PHYSICAL EXAM:  VITAL SIGNS: Vitals:   05/26/21 1554 05/26/21 1813  BP: 105/66 115/84  Pulse: (!) 105 89  Resp: 20  18  Temp: 98.2 F (36.8 C) 98.3 F (36.8 C)  SpO2: 99% 100%     Constitutional: Alert and oriented. Well appearing and in no acute distress. Eyes: Conjunctivae are normal. PERRL. EOMI. Head: Atraumatic. Nose: No congestion/rhinnorhea. Mouth/Throat: Mucous membranes are moist.  Oropharynx non-erythematous. Neck: No stridor. No cervical spine tenderness to palpation. Cardiovascular: Normal rate, regular rhythm. Grossly normal heart sounds.  Good peripheral circulation. Respiratory: Normal respiratory effort.  No retractions. Lungs CTAB. Gastrointestinal: Soft , nondistended. No CVA tenderness.. Mild periumbilical tenderness without peritoneal features.  Abdomen is otherwise benign. Musculoskeletal: No lower extremity tenderness nor edema.  No joint effusions. No signs of acute trauma. Neurologic:  Normal speech and language. No gross focal neurologic deficits are appreciated. No gait instability noted. Skin:  Skin is warm, dry and intact. No rash noted. Psychiatric: Mood and affect are normal. Speech and behavior are normal.  ____________________________________________   LABS (all labs ordered  are listed, but only abnormal results are displayed)  Labs Reviewed  LIPASE, BLOOD - Abnormal; Notable for the following components:      Result Value   Lipase 78 (*)    All other components within normal limits  COMPREHENSIVE METABOLIC PANEL - Abnormal; Notable for the following components:   Glucose, Bld 107 (*)    BUN 22 (*)    AST 76 (*)    Alkaline Phosphatase 142 (*)    Total Bilirubin 2.0 (*)    Anion gap 18 (*)    All other components within normal limits  CBC - Abnormal; Notable for the following components:   MCHC 36.2 (*)    Platelets 147 (*)    All other components within normal limits  URINALYSIS, COMPLETE (UACMP) WITH MICROSCOPIC   ____________________________________________  12 Lead EKG   ____________________________________________  RADIOLOGY  ED MD  interpretation: CT abdomen pending  Official radiology report(s): CT ABDOMEN PELVIS W CONTRAST  Result Date: 05/26/2021 CLINICAL DATA:  Acute pancreatitis, evaluate complication. Biliary stone. EXAM: CT ABDOMEN AND PELVIS WITH CONTRAST TECHNIQUE: Multidetector CT imaging of the abdomen and pelvis was performed using the standard protocol following bolus administration of intravenous contrast. CONTRAST:  OMNIPAQUE IOHEXOL 350 MG/ML SOLN COMPARISON:  CT May 04, 2021. FINDINGS: Lower chest: Mild four-chamber cardiac enlargement. No acute abnormality. Hepatobiliary: Diffuse hepatic steatosis. No suspicious hepatic lesion. Gallbladder is dilated without wall thickening or pericholecystic fluid. No biliary ductal dilation. Pancreas: Edematous appearance of the pancreatic head with peripancreatic fluid. No pancreatic ductal dilation. Spleen: Within normal limits. Adrenals/Urinary Tract: 1.4 cm right adrenal nodule and left thickening with a few small adrenal nodules measuring up to 1 cm, stable dating back to CT chest December 04, 2017 and likely representing hyperplasia and small benign adrenal adenomas. Kidneys are normal, without renal calculi, solid enhancing lesion, or hydronephrosis. Bladder is unremarkable. Stomach/Bowel: Stomach is within normal limits. Appendix appears normal. No evidence of bowel wall thickening, distention, or inflammatory changes. Vascular/Lymphatic: The portal, splenic and superior mesenteric veins are patent. No abdominal aortic aneurysm. No pathologically enlarged abdominal or pelvic lymph nodes. Reproductive: Prostate is unremarkable. Other: No walled off fluid collections. No pneumoperitoneum. No portal venous gas. Musculoskeletal: No acute or significant osseous findings. IMPRESSION: 1. Acute uncomplicated interstitial pancreatitis. No evidence of pancreatic necrosis or walled off fluid collection. 2. Distended gallbladder without wall thickening or pericholecystic fluid,  likely reflecting fasting state. Correlation with laboratory values and tenderness to palpation is suggested. 3. Hepatic steatosis. Electronically Signed   By: Maudry Mayhew MD   On: 05/26/2021 23:24    ____________________________________________   PROCEDURES and INTERVENTIONS  Procedure(s) performed (including Critical Care):  Procedures  Medications  oxyCODONE-acetaminophen (PERCOCET/ROXICET) 5-325 MG per tablet 1 tablet (1 tablet Oral Given 05/26/21 1556)  ondansetron (ZOFRAN-ODT) disintegrating tablet 4 mg (4 mg Oral Given 05/26/21 1556)  lactated ringers bolus 1,000 mL (1,000 mLs Intravenous New Bag/Given 05/26/21 2151)  morphine 4 MG/ML injection 6 mg (6 mg Intravenous Given 05/26/21 2151)  ondansetron (ZOFRAN) injection 4 mg (4 mg Intravenous Given 05/26/21 2151)  ketorolac (TORADOL) 30 MG/ML injection 15 mg (15 mg Intravenous Given 05/26/21 2246)  iohexol (OMNIPAQUE) 350 MG/ML injection 100 mL (100 mLs Intravenous Contrast Given 05/26/21 2257)    ____________________________________________   MDM / ED COURSE   40 year old male with history of alcohol abuse presents to the ED with another episode of pancreatitis.  Exam is generally reassuring without distress or peritoneal abdominal tenderness.  Mild tenderness is  noted.  Otherwise normal examination.  Blood work with slight increase to his lipase to 78.  Leukocytosis or fevers.  CT abdomen/pelvis pending at time of signout to assess for associated complications such as pseudocyst or biliary obstruction.  Considering the rather mild nature of his symptoms, hopefully he will be able to go home with symptomatic measures.  CT results while I am still here after signout and I reassessed the patient and he reports feeling better and is tolerating liquids.  No evidence of associated complications from his pancreatitis.  Stable for outpatient management.  Clinical Course as of 05/26/21 2338  Wed May 26, 2021  2335 Reassessed.  Patient  reports feeling better and has tolerated intake of liquids.  We discussed cessation from alcohol and outpatient management.  He is agreeable. [DS]    Clinical Course User Index [DS] Delton Prairie, MD    ____________________________________________   FINAL CLINICAL IMPRESSION(S) / ED DIAGNOSES  Final diagnoses:  Periumbilical abdominal pain  Alcohol-induced acute pancreatitis, unspecified complication status  Alcohol abuse     ED Discharge Orders          Ordered    ondansetron (ZOFRAN ODT) 4 MG disintegrating tablet  Every 8 hours PRN,   Status:  Discontinued        05/26/21 2321    ondansetron (ZOFRAN ODT) 4 MG disintegrating tablet  Every 8 hours PRN        05/26/21 2337    oxyCODONE (ROXICODONE) 5 MG immediate release tablet  Every 8 hours PRN        05/26/21 2337             Delton Prairie   Note:  This document was prepared using Dragon voice recognition software and may include unintentional dictation errors.    Delton Prairie, MD 05/26/21 Clinton Sawyer    Delton Prairie, MD 05/26/21 [phone]

## 2021-05-26 NOTE — ED Triage Notes (Signed)
First nurse note: comes ems from home with abd pain. Hx of pancreatitis. Had etoh and greasy food yesterday. VSS. CBG 108.

## 2021-05-26 NOTE — ED Triage Notes (Signed)
Pt comes into the ED via [facility] from home c/o generalized abd pain that started this morning.  Pt has known history of pancreatitis and states this feels the same.  Pt does admit to ETOH and greasy foods yesterday.

## 2021-05-26 NOTE — Discharge Instructions (Addendum)
Use Tylenol for pain and fevers.  Up to 1000 mg per dose, up to 4 times per day.  Do not take more than 4000 mg of Tylenol/acetaminophen within 24 hours..  Use naproxen/Aleve for anti-inflammatory pain relief. Use up to 500mg every 12 hours. Do not take more frequently than this. Do not use other NSAIDs (ibuprofen, Advil) while taking this medication. It is safe to take Tylenol with this.   

## 2021-05-27 ENCOUNTER — Ambulatory Visit: Payer: Self-pay | Admitting: *Deleted

## 2021-05-27 ENCOUNTER — Other Ambulatory Visit: Payer: Self-pay

## 2021-05-27 DIAGNOSIS — J45909 Unspecified asthma, uncomplicated: Secondary | ICD-10-CM | POA: Insufficient documentation

## 2021-05-27 DIAGNOSIS — K852 Alcohol induced acute pancreatitis without necrosis or infection: Principal | ICD-10-CM | POA: Insufficient documentation

## 2021-05-27 DIAGNOSIS — Z79899 Other long term (current) drug therapy: Secondary | ICD-10-CM | POA: Insufficient documentation

## 2021-05-27 DIAGNOSIS — Z20822 Contact with and (suspected) exposure to covid-19: Secondary | ICD-10-CM | POA: Insufficient documentation

## 2021-05-27 DIAGNOSIS — I5032 Chronic diastolic (congestive) heart failure: Secondary | ICD-10-CM | POA: Insufficient documentation

## 2021-05-27 DIAGNOSIS — I110 Hypertensive heart disease with heart failure: Secondary | ICD-10-CM | POA: Insufficient documentation

## 2021-05-27 DIAGNOSIS — F10239 Alcohol dependence with withdrawal, unspecified: Secondary | ICD-10-CM | POA: Insufficient documentation

## 2021-05-27 DIAGNOSIS — F17210 Nicotine dependence, cigarettes, uncomplicated: Secondary | ICD-10-CM | POA: Insufficient documentation

## 2021-05-27 LAB — URINALYSIS, COMPLETE (UACMP) WITH MICROSCOPIC
Bacteria, UA: NONE SEEN
Bilirubin Urine: NEGATIVE
Glucose, UA: 50 mg/dL — AB
Ketones, ur: NEGATIVE mg/dL
Nitrite: NEGATIVE
Protein, ur: 100 mg/dL — AB
Specific Gravity, Urine: 1.018 (ref 1.005–1.030)
Squamous Epithelial / HPF: NONE SEEN (ref 0–5)
pH: 5 (ref 5.0–8.0)

## 2021-05-27 LAB — LIPASE, BLOOD: Lipase: 75 U/L — ABNORMAL HIGH (ref 11–51)

## 2021-05-27 LAB — COMPREHENSIVE METABOLIC PANEL
ALT: 35 U/L (ref 0–44)
AST: 56 U/L — ABNORMAL HIGH (ref 15–41)
Albumin: 3.9 g/dL (ref 3.5–5.0)
Alkaline Phosphatase: 142 U/L — ABNORMAL HIGH (ref 38–126)
Anion gap: 13 (ref 5–15)
BUN: 13 mg/dL (ref 6–20)
CO2: 27 mmol/L (ref 22–32)
Calcium: 8.9 mg/dL (ref 8.9–10.3)
Chloride: 97 mmol/L — ABNORMAL LOW (ref 98–111)
Creatinine, Ser: 1.34 mg/dL — ABNORMAL HIGH (ref 0.61–1.24)
GFR, Estimated: >60 mL/min (ref >60)
Glucose, Bld: 304 mg/dL — ABNORMAL HIGH (ref 70–99)
Potassium: 3.3 mmol/L — ABNORMAL LOW (ref 3.5–5.1)
Sodium: 137 mmol/L (ref 135–145)
Total Bilirubin: 1.7 mg/dL — ABNORMAL HIGH (ref 0.3–1.2)
Total Protein: 7.4 g/dL (ref 6.5–8.1)

## 2021-05-27 LAB — CBC
HCT: 38.1 % — ABNORMAL LOW (ref 39.0–52.0)
Hemoglobin: 13.9 g/dL (ref 13.0–17.0)
MCH: 31.4 pg (ref 26.0–34.0)
MCHC: 36.5 g/dL — ABNORMAL HIGH (ref 30.0–36.0)
MCV: 86.2 fL (ref 80.0–100.0)
Platelets: 128 10*3/uL — ABNORMAL LOW (ref 150–400)
RBC: 4.42 MIL/uL (ref 4.22–5.81)
RDW: 15.1 % (ref 11.5–15.5)
WBC: 9 10*3/uL (ref 4.0–10.5)
nRBC: 0 % (ref 0.0–0.2)

## 2021-05-27 NOTE — Telephone Encounter (Signed)
Reason for Disposition  [1] SEVERE pain (e.g., excruciating) AND [2] present > 1 hour    Diagnosed with pancreatitis yesterday in ED.   Having the same pain today.  Answer Assessment - Initial Assessment Questions 1. LOCATION: "Where does it hurt?"      Seen in the ED yesterday still having same abd. Pain.   Diagnosed with pancreatitis.    Pain medicine he was given is not working.   Can we prescribe something else over the phone without coming back in?   I let him know we could not prescribe medication over the phone that he would need to come back in if he was still in the same pain.   Complete triage not done since he was seen in the ED yesterday and he is having the same pain without changes in his condition. 2. RADIATION: "Does the pain shoot anywhere else?" (e.g., chest, back)     *No Answer* 3. ONSET: "When did the pain begin?" (Minutes, hours or days ago)      *No Answer* 4. SUDDEN: "Gradual or sudden onset?"     *No Answer* 5. PATTERN "Does the pain come and go, or is it constant?"    - If constant: "Is it getting better, staying the same, or worsening?"      (Note: Constant means the pain never goes away completely; most serious pain is constant and it progresses)     - If intermittent: "How long does it last?" "Do you have pain now?"     (Note: Intermittent means the pain goes away completely between bouts)     *No Answer* 6. SEVERITY: "How bad is the pain?"  (e.g., Scale 1-10; mild, moderate, or severe)    - MILD (1-3): doesn't interfere with normal activities, abdomen soft and not tender to touch     - MODERATE (4-7): interferes with normal activities or awakens from sleep, abdomen tender to touch     - SEVERE (8-10): excruciating pain, doubled over, unable to do any normal activities       *No Answer* 7. RECURRENT SYMPTOM: "Have you ever had this type of stomach pain before?" If Yes, ask: "When was the last time?" and "What happened that time?"      *No Answer* 8. CAUSE:  "What do you think is causing the stomach pain?"     *No Answer* 9. RELIEVING/AGGRAVATING FACTORS: "What makes it better or worse?" (e.g., movement, antacids, bowel movement)     *No Answer* 10. OTHER SYMPTOMS: "Do you have any other symptoms?" (e.g., back pain, diarrhea, fever, urination pain, vomiting)       *No Answer*  Protocols used: Abdominal Pain - Male-A-AH

## 2021-05-27 NOTE — Telephone Encounter (Signed)
Pt seen in the ED yesterday in ED diagnosed with pancreatitis.   Having same pain today without relief from the pain medication.   He wanted to know if we could prescribe him something over the phone without coming back in.   I let him know he would need to come back in.  Not sure what he decided to do.   He thanked me for my help and ended the call.

## 2021-05-27 NOTE — ED Triage Notes (Signed)
Pt states he was seen yesterday for an inflamed pancreas. Pt states he is still experiencing pain today and would like pain medicine. Pt states he was given oxycodone and it did not work. Pt states he is still in the same pain. Pt denies N/V

## 2021-05-28 ENCOUNTER — Observation Stay
Admission: EM | Admit: 2021-05-28 | Discharge: 2021-05-29 | Disposition: A | Discharge status: Home / Self Care | Payer: Self-pay | Attending: Internal Medicine | Admitting: Internal Medicine

## 2021-05-28 ENCOUNTER — Emergency Department: Payer: Self-pay

## 2021-05-28 DIAGNOSIS — Z720 Tobacco use: Secondary | ICD-10-CM | POA: Diagnosis present

## 2021-05-28 DIAGNOSIS — R52 Pain, unspecified: Secondary | ICD-10-CM

## 2021-05-28 DIAGNOSIS — K852 Alcohol induced acute pancreatitis without necrosis or infection: Principal | ICD-10-CM | POA: Diagnosis present

## 2021-05-28 DIAGNOSIS — R1013 Epigastric pain: Secondary | ICD-10-CM

## 2021-05-28 DIAGNOSIS — F10930 Alcohol use, unspecified with withdrawal, uncomplicated: Secondary | ICD-10-CM | POA: Diagnosis present

## 2021-05-28 DIAGNOSIS — K219 Gastro-esophageal reflux disease without esophagitis: Secondary | ICD-10-CM | POA: Diagnosis present

## 2021-05-28 DIAGNOSIS — F10230 Alcohol dependence with withdrawal, uncomplicated: Secondary | ICD-10-CM

## 2021-05-28 DIAGNOSIS — I10 Essential (primary) hypertension: Secondary | ICD-10-CM | POA: Diagnosis present

## 2021-05-28 LAB — URINE DRUG SCREEN, QUALITATIVE (ARMC ONLY)
Amphetamines, Ur Screen: NOT DETECTED
Barbiturates, Ur Screen: NOT DETECTED
Benzodiazepine, Ur Scrn: NOT DETECTED
Cannabinoid 50 Ng, Ur ~~LOC~~: NOT DETECTED
Cocaine Metabolite,Ur ~~LOC~~: NOT DETECTED
MDMA (Ecstasy)Ur Screen: NOT DETECTED
Methadone Scn, Ur: NOT DETECTED
Opiate, Ur Screen: POSITIVE — AB
Phencyclidine (PCP) Ur S: NOT DETECTED
Tricyclic, Ur Screen: NOT DETECTED

## 2021-05-28 LAB — CBC
HCT: 36.1 % — ABNORMAL LOW (ref 39.0–52.0)
Hemoglobin: 12.9 g/dL — ABNORMAL LOW (ref 13.0–17.0)
MCH: 30.8 pg (ref 26.0–34.0)
MCHC: 35.7 g/dL (ref 30.0–36.0)
MCV: 86.2 fL (ref 80.0–100.0)
Platelets: 108 10*3/uL — ABNORMAL LOW (ref 150–400)
RBC: 4.19 MIL/uL — ABNORMAL LOW (ref 4.22–5.81)
RDW: 15 % (ref 11.5–15.5)
WBC: 7.3 10*3/uL (ref 4.0–10.5)
nRBC: 0 % (ref 0.0–0.2)

## 2021-05-28 LAB — RESP PANEL BY RT-PCR (FLU A&B, COVID) ARPGX2
Influenza A by PCR: NEGATIVE
Influenza B by PCR: NEGATIVE
SARS Coronavirus 2 by RT PCR: NEGATIVE

## 2021-05-28 LAB — CREATININE, SERUM
Creatinine, Ser: 1.22 mg/dL (ref 0.61–1.24)
GFR, Estimated: >60 mL/min (ref >60)

## 2021-05-28 MED ORDER — NICOTINE 21 MG/24HR TD PT24
21.0000 mg | MEDICATED_PATCH | Freq: Every day | TRANSDERMAL | Status: DC
  Administered 2021-05-28: 21 mg via TRANSDERMAL
  Filled 2021-05-28 (×2): qty 1

## 2021-05-28 MED ORDER — OXYCODONE HCL 5 MG PO TABS
15.0000 mg | ORAL_TABLET | ORAL | Status: DC | PRN
  Administered 2021-05-28 – 2021-05-29 (×4): 15 mg via ORAL
  Filled 2021-05-28 (×4): qty 3

## 2021-05-28 MED ORDER — NICOTINE 21 MG/24HR TD PT24
21.0000 mg | MEDICATED_PATCH | Freq: Every day | TRANSDERMAL | Status: DC
  Filled 2021-05-28: qty 1

## 2021-05-28 MED ORDER — HYDROMORPHONE HCL 1 MG/ML IJ SOLN
1.0000 mg | Freq: Once | INTRAMUSCULAR | Status: AC
  Administered 2021-05-28: 1 mg via INTRAVENOUS
  Filled 2021-05-28: qty 1

## 2021-05-28 MED ORDER — LOPERAMIDE HCL 2 MG PO CAPS: 2.0000 mg | ORAL_CAPSULE | ORAL | Status: DC | PRN

## 2021-05-28 MED ORDER — ENOXAPARIN SODIUM 40 MG/0.4ML IJ SOSY
40.0000 mg | PREFILLED_SYRINGE | INTRAMUSCULAR | Status: DC
  Filled 2021-05-28: qty 0.4

## 2021-05-28 MED ORDER — CHLORDIAZEPOXIDE HCL 25 MG PO CAPS
25.0000 mg | ORAL_CAPSULE | Freq: Four times a day (QID) | ORAL | Status: AC
  Administered 2021-05-28 (×4): 25 mg via ORAL
  Filled 2021-05-28 (×4): qty 1

## 2021-05-28 MED ORDER — LORAZEPAM 2 MG/ML IJ SOLN
0.0000 mg | Freq: Four times a day (QID) | INTRAMUSCULAR | Status: DC
Start: 2021-05-28 — End: 2021-05-29
  Administered 2021-05-28: 1 mg via INTRAVENOUS
  Administered 2021-05-28: 2 mg via INTRAVENOUS
  Administered 2021-05-29: 1 mg via INTRAVENOUS
  Filled 2021-05-28 (×3): qty 1

## 2021-05-28 MED ORDER — SODIUM CHLORIDE 0.9 % IV BOLUS
1000.0000 mL | Freq: Once | INTRAVENOUS | Status: AC
  Administered 2021-05-28: 1000 mL via INTRAVENOUS

## 2021-05-28 MED ORDER — ONDANSETRON 4 MG PO TBDP: 4.0000 mg | ORAL_TABLET | Freq: Four times a day (QID) | ORAL | Status: DC | PRN

## 2021-05-28 MED ORDER — ONDANSETRON HCL 4 MG/2ML IJ SOLN
4.0000 mg | Freq: Once | INTRAMUSCULAR | Status: AC
  Administered 2021-05-28: 4 mg via INTRAVENOUS
  Filled 2021-05-28: qty 2

## 2021-05-28 MED ORDER — ADULT MULTIVITAMIN W/MINERALS CH
1.0000 | ORAL_TABLET | Freq: Every day | ORAL | Status: DC
  Administered 2021-05-28 – 2021-05-29 (×2): 1 via ORAL
  Filled 2021-05-28 (×3): qty 1

## 2021-05-28 MED ORDER — OXYCODONE HCL 5 MG PO TABS
10.0000 mg | ORAL_TABLET | ORAL | Status: DC | PRN
Start: 2021-05-28 — End: 2021-05-29

## 2021-05-28 MED ORDER — THIAMINE HCL 100 MG/ML IJ SOLN: 100.0000 mg | Freq: Once | INTRAMUSCULAR | Status: DC

## 2021-05-28 MED ORDER — ACETAMINOPHEN 325 MG PO TABS: 650.0000 mg | ORAL_TABLET | Freq: Four times a day (QID) | ORAL | Status: DC | PRN

## 2021-05-28 MED ORDER — LACTATED RINGERS IV SOLN: INTRAVENOUS | Status: AC

## 2021-05-28 MED ORDER — CHLORDIAZEPOXIDE HCL 25 MG PO CAPS
25.0000 mg | ORAL_CAPSULE | ORAL | Status: DC
Start: 2021-05-30 — End: 2021-05-29

## 2021-05-28 MED ORDER — METOPROLOL TARTRATE 5 MG/5ML IV SOLN
5.0000 mg | Freq: Once | INTRAVENOUS | Status: AC
  Administered 2021-05-28: 5 mg via INTRAVENOUS
  Filled 2021-05-28: qty 5

## 2021-05-28 MED ORDER — ACETAMINOPHEN 650 MG RE SUPP: 650.0000 mg | Freq: Four times a day (QID) | RECTAL | Status: DC | PRN

## 2021-05-28 MED ORDER — THIAMINE HCL 100 MG PO TABS
100.0000 mg | ORAL_TABLET | Freq: Every day | ORAL | Status: DC
  Administered 2021-05-28 – 2021-05-29 (×2): 100 mg via ORAL
  Filled 2021-05-28 (×3): qty 1

## 2021-05-28 MED ORDER — CHLORDIAZEPOXIDE HCL 25 MG PO CAPS: 25.0000 mg | ORAL_CAPSULE | Freq: Every day | ORAL | Status: DC

## 2021-05-28 MED ORDER — CHLORDIAZEPOXIDE HCL 25 MG PO CAPS: 25.0000 mg | ORAL_CAPSULE | Freq: Three times a day (TID) | ORAL | Status: DC

## 2021-05-28 MED ORDER — HYDROXYZINE HCL 25 MG PO TABS
25.0000 mg | ORAL_TABLET | Freq: Four times a day (QID) | ORAL | Status: DC | PRN
  Administered 2021-05-29: 25 mg via ORAL
  Filled 2021-05-28: qty 1

## 2021-05-28 MED ORDER — ONDANSETRON HCL 4 MG/2ML IJ SOLN: 4.0000 mg | Freq: Four times a day (QID) | INTRAMUSCULAR | Status: DC | PRN

## 2021-05-28 MED ORDER — CLONIDINE HCL 0.1 MG PO TABS: 0.1000 mg | ORAL_TABLET | Freq: Four times a day (QID) | ORAL | Status: DC | PRN

## 2021-05-28 MED ORDER — ONDANSETRON HCL 4 MG PO TABS: 4.0000 mg | ORAL_TABLET | Freq: Four times a day (QID) | ORAL | Status: DC | PRN

## 2021-05-28 MED ORDER — METOPROLOL SUCCINATE ER 50 MG PO TB24
50.0000 mg | ORAL_TABLET | Freq: Every day | ORAL | Status: DC
  Administered 2021-05-28 – 2021-05-29 (×2): 50 mg via ORAL
  Filled 2021-05-28 (×2): qty 1

## 2021-05-28 NOTE — Assessment & Plan Note (Signed)
Continue with toprol-xl. Give 1 dose of IV lopressor in ER.

## 2021-05-28 NOTE — Assessment & Plan Note (Signed)
-   Continue with PPI 

## 2021-05-28 NOTE — ED Provider Notes (Signed)
[facility] Emergency Department Provider Note   ____________________________________________   Event Date/Time   First MD Initiated Contact with Patient 05/28/21 [phone]     (approximate)  I have reviewed the triage vital signs and the nursing notes.   HISTORY  Chief Complaint Abdominal Pain    HPI Mario Beck is a 40 y.o. male who returns to the ED from home with persistent epigastric abdominal pain.  Patient was seen 2 nights ago for same; found to have pancreatitis on CT scan.  Patient has had history of similar secondary to EtOH abuse.  Denies EtOH since being seen in the ED and is currently feeling jittery.  Denies fever, cough, chest pain, shortness of breath, nausea, vomiting or diarrhea.     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   Chronic systolic CHF (congestive heart failure) (HCC) 09/17/2020   Alcoholic cardiomyopathy (HCC) 09/47/0962   Alcoholic pancreatitis 09/16/2020   Tobacco abuse 09/16/2020   Abnormal EKG 09/16/2020   GERD (gastroesophageal reflux disease) 09/16/2020   Depression 09/16/2020   Dark stools 09/16/2020   Elevated troponin 09/16/2020   Transaminitis 03/31/2020   Nausea and vomiting 02/21/2020   Intractable nausea and vomiting 02/21/2020   Tobacco abuse counseling    Acute alcoholic pancreatitis 02/18/2020   HTN (hypertension), malignant 02/18/2020   Alcohol use disorder, moderate, dependence (HCC) 02/18/2020   Acute pancreatitis 02/18/2020    Past Surgical History:  Procedure Laterality Date   FRACTURE SURGERY     Left leg as a kid    Prior to Admission medications   Medication Sig Start Date End Date Taking? Authorizing Provider  amLODipine (NORVASC) 10 MG tablet Take 1 tablet (10 mg total) by mouth  daily. 04/03/20 09/16/20  Charise Killian, MD  atorvastatin (LIPITOR) 40 MG tablet Take 1 tablet (40 mg total) by mouth daily. 09/18/20   Marrion Coy, MD  carvedilol (COREG) 12.5 MG tablet TAKE ONE TABLET BY MOUTH 2 TIMES A DAY WITH A MEAL 09/18/20 09/18/21  Marrion Coy, MD  furosemide (LASIX) 40 MG tablet Take 1 tablet (40 mg total) by mouth daily. 12/11/20 12/11/21  Gilles Chiquito, MD  HYDROcodone-acetaminophen (NORCO) 5-325 MG tablet Take 1 tablet by mouth every 6 (six) hours as needed for moderate pain. Patient not taking: No sig reported 11/29/20   Irean Hong, MD  ibuprofen (ADVIL) 800 MG tablet Take 1 tablet (800 mg total) by mouth every 8 (eight) hours as needed for moderate pain. Patient not taking: No sig reported 11/29/20   Irean Hong, MD  LORazepam (ATIVAN) 1 MG tablet Take 1 tablet (1 mg total) by mouth every 8 (eight) hours as needed for anxiety. 05/05/21   Irean Hong, MD  mirtazapine (REMERON) 15 MG tablet Take 1 tablet (15 mg total) by mouth at bedtime. Patient not taking: Reported on 02/19/2021 02/23/20   [name], DO  nicotine (NICODERM CQ - DOSED IN MG/24 HOURS) 21 mg/24hr patch Place 1 patch (21 mg total) onto the skin daily. Patient not taking: No sig reported 09/18/20   Marrion Coy, MD  ondansetron (ZOFRAN ODT) 4 MG disintegrating tablet Take 1 tablet (4 mg total) by mouth every 8 (eight) hours as needed for nausea or  vomiting. 05/05/21   Irean Hong, MD  ondansetron (ZOFRAN ODT) 4 MG disintegrating tablet Take 1 tablet (4 mg total) by mouth every 8 (eight) hours as needed for nausea or vomiting. 05/26/21   Delton Prairie, MD  oxyCODONE (ROXICODONE) 5 MG immediate release tablet Take 1 tablet (5 mg total) by mouth every 8 (eight) hours as needed for severe pain or breakthrough pain. 05/26/21 05/26/22  Delton Prairie, MD  oxyCODONE-acetaminophen (PERCOCET/ROXICET) 5-325 MG tablet Take 1 tablet by mouth every 4 (four) hours as needed for severe pain. 05/05/21   Irean Hong, MD   pantoprazole (PROTONIX) 40 MG tablet TAKE ONE TABLET BY MOUTH EVERY DAY 09/18/20 09/18/21  Marrion Coy, MD  potassium chloride (KLOR-CON) 10 MEQ tablet Take 2 tablets (20 mEq total) by mouth daily. 12/11/20 01/10/21  Gilles Chiquito, MD  sertraline (ZOLOFT) 100 MG tablet Take 1 tablet (100 mg total) by mouth daily in the afternoon. 04/03/20 05/03/20  Charise Killian, MD  spironolactone (ALDACTONE) 25 MG tablet Take 1 tablet (25 mg total) by mouth daily. 09/19/20   Marrion Coy, MD  thiamine (VITAMIN B-1) 100 MG tablet TAKE ONE TABLET BY MOUTH EVERY DAY FOR 14 DAYS 09/18/20 09/18/21  Marrion Coy, MD    Allergies Lisinopril and Shellfish allergy  Family History  Problem Relation Age of Onset   Colon cancer Neg Hx    Esophageal cancer Neg Hx     Social History Social History   Tobacco Use   Smoking status: Every Day    Packs/day: 1.00    Types: Cigarettes   Smokeless tobacco: Current    Types: Snuff   Tobacco comments:    Does pouches as welll   Vaping Use   Vaping Use: Never used  Substance Use Topics   Alcohol use: Yes    Alcohol/week: 7.0 standard drinks    Types: 7 Cans of beer per week   Drug use: No    Review of Systems  Constitutional: No fever/chills Eyes: No visual changes. ENT: No sore throat. Cardiovascular: Denies chest pain. Respiratory: Denies shortness of breath. Gastrointestinal: Positive for persistent abdominal pain.  No nausea, no vomiting.  No diarrhea.  No constipation. Genitourinary: Negative for dysuria. Musculoskeletal: Negative for back pain. Skin: Negative for rash. Neurological: Negative for headaches, focal weakness or numbness. Psychiatric: Positive for restlessness secondary to alcohol withdrawal.   ____________________________________________   PHYSICAL EXAM:  VITAL SIGNS: ED Triage Vitals  Enc Vitals Group     BP 05/28/21 0056 (!) 139/105     Pulse Rate 05/27/21 2242 (!) 116     Resp 05/27/21 2242 18     Temp 05/27/21 2242  97.9 F (36.6 C)     Temp Source 05/27/21 2242 Oral     SpO2 05/27/21 2242 99 %     Weight 05/27/21 2243 182 lb 15.7 oz (83 kg)     Height 05/27/21 2243 6\' 3"  (1.905 m)     Head Circumference --      Peak Flow --      Pain Score 05/27/21 2243 10     Pain Loc --      Pain Edu? --      Excl. in GC? --     Constitutional: Alert and oriented.  Uncomfortable appearing and in mild acute distress. Eyes: Conjunctivae are normal. PERRL. EOMI. Head: Atraumatic. Nose: No congestion/rhinnorhea. Mouth/Throat: Mucous membranes are mildly dry. Neck: No stridor.   Cardiovascular: Tachycardic rate, regular rhythm. Grossly normal heart sounds.  Good peripheral circulation. Respiratory: Normal respiratory effort.  No retractions. Lungs CTAB. Gastrointestinal: Soft and mildly tender to palpation epigastrium without rebound or guard. No distention. No abdominal bruits. No CVA tenderness. Musculoskeletal: No lower extremity tenderness nor edema.  No joint effusions. Neurologic:  Normal speech and language. No gross focal neurologic deficits are appreciated. No gait instability. Skin:  Skin is warm, dry and intact. No rash noted. Psychiatric: Mood and affect are normal. Speech and behavior are normal.  ____________________________________________   LABS (all labs ordered are listed, but only abnormal results are displayed)  Labs Reviewed  LIPASE, BLOOD - Abnormal; Notable for the following components:      Result Value   Lipase 75 (*)    All other components within normal limits  COMPREHENSIVE METABOLIC PANEL - Abnormal; Notable for the following components:   Potassium 3.3 (*)    Chloride 97 (*)    Glucose, Bld 304 (*)    Creatinine, Ser 1.34 (*)    AST 56 (*)    Alkaline Phosphatase 142 (*)    Total Bilirubin 1.7 (*)    All other components within normal limits  CBC - Abnormal; Notable for the following components:   HCT 38.1 (*)    MCHC 36.5 (*)    Platelets 128 (*)    All other  components within normal limits  URINALYSIS, COMPLETE (UACMP) WITH MICROSCOPIC - Abnormal; Notable for the following components:   Color, Urine YELLOW (*)    APPearance HAZY (*)    Glucose, UA 50 (*)    Hgb urine dipstick SMALL (*)    Protein, ur 100 (*)    Leukocytes,Ua TRACE (*)    All other components within normal limits  RESP PANEL BY RT-PCR (FLU A&B, COVID) [id number]  URINE CULTURE   ____________________________________________  EKG  ED ECG REPORT I, Orissa Arreaga J, the attending physician, personally viewed and interpreted this ECG.   Date: 05/28/2021  EKG Time: 2247  Rate: 119  Rhythm: sinus tachycardia  Axis: Normal  Intervals:none  ST&T Change: Nonspecific  ____________________________________________  RADIOLOGY I, Oliviagrace Crisanti J, personally viewed and evaluated these images (plain radiographs) as part of my medical decision making, as well as reviewing the written report by the radiologist.  ED MD interpretation: Isolated gallbladder distention without cholecystitis or biliary ductal dilatation  Official radiology report(s): US ABDOMEN LIMITED RUQ (LIVER/GB)  Result Date: 05/28/2021 CLINICAL DATA:  Epigastric pain EXAM: ULTRASOUND ABDOMEN LIMITED RIGHT UPPER QUADRANT COMPARISON:  CT 05/26/2021 FINDINGS: Gallbladder: Moderate gallbladder distension (diameter 4.7 cm, length 10.2 cm). No gallbladder wall thickening or pericholecystic fluid. No visible echogenic shadowing gallstones or biliary sludge. Sonographic Eulah Pont sign is reportedly negative. Common bile duct: Diameter: 3.8 mm, nondilated. Liver: Echogenic 1.4 x 1 x 2.2 cm focus adjacent the porta hepatis corresponding to a geographic region of hypoattenuation on narrow windows CT imaging may reflect more focal fatty infiltration in this location or hemangioma. No other focal liver lesion. Otherwise normal hepatic echogenicity. Smooth surface contour. Portal vein is patent on color Doppler imaging with normal direction of  blood flow towards the liver. Other: None. IMPRESSION: Isolated gallbladder distension without supportive sonographic features of acute cholecystitis or biliary ductal dilatation. Can be related to a fasting state however there is persisting concern, HIDA could be obtained. Echogenic 2.2 cm focus adjacent the porta hepatis most likely to reflect an area of focal fatty infiltration versus less likely hepatic hemangioma or other primary hepatic lesion particularly given a geographic hypoattenuation seen in this vicinity  on comparison CT (CT series 2, image 25). However, if further characterization is clinically warranted based on patient risk factors and demographics, outpatient nonemergent hepatic MRI with contrast could be obtained when patient is better able to tolerate. Electronically Signed   By: Kreg Shropshire M.D.   On: 05/28/2021 03:40    ____________________________________________   PROCEDURES  Procedure(s) performed (including Critical Care):  Procedures  CRITICAL CARE Performed by: Irean Hong   Total critical care time: 30 minutes  Critical care time was exclusive of separately billable procedures and treating other patients.  Critical care was necessary to treat or prevent imminent or life-threatening deterioration.  Critical care was time spent personally by me on the following activities: development of treatment plan with patient and/or surrogate as well as nursing, discussions with consultants, evaluation of patient's response to treatment, examination of patient, obtaining history from patient or surrogate, ordering and performing treatments and interventions, ordering and review of laboratory studies, ordering and review of radiographic studies, pulse oximetry and re-evaluation of patient's condition.  ____________________________________________   INITIAL IMPRESSION / ASSESSMENT AND PLAN / ED COURSE  As part of my medical decision making, I reviewed the following data  within the electronic MEDICAL RECORD NUMBER Nursing notes reviewed and incorporated, Labs reviewed, EKG interpreted, Old chart reviewed, Radiograph reviewed, Discussed with admitting physician, and Notes from prior ED visits     40 year old male who returns to the ED for persistent abdominal pain secondary to pancreatitis. Differential diagnosis includes, but is not limited to, biliary disease (biliary colic, acute cholecystitis, cholangitis, choledocholithiasis, etc), intrathoracic causes for epigastric abdominal pain including ACS, gastritis, duodenitis, pancreatitis, small bowel or large bowel obstruction, abdominal aortic aneurysm, hernia, and ulcer(s).   Lipase stable from prior; AKI, mild hyponatremia noted.  Trace leukocytes and UA without symptoms; will add urine culture.  I personally reviewed patient's chart and see that he had a swollen gallbladder on CT scan from the prior ED visit.  Will obtain right upper quadrant abdominal ultrasound to evaluate for cholecystitis.  Initiate IV fluid hydration, place on CIWA scale as patient is tachycardic and hypertensive and likely in withdrawal, IV Dilaudid for pain.  With IV Zofran for nausea.  Will reassess.  Clinical Course as of 05/28/21 0559  Fri May 28, 2021  0402 Pain 7/10.  Updated patient on ultrasound result.  Will redose IV analgesia. [JS]  [id number] Patient complains of persistent pain.  Remains tachycardic and hypertensive; received 2 mg IV Ativan per CIWA scale.  Will discuss with hospitalist services for admission. [JS]    Clinical Course User Index [JS] Irean Hong, MD     ____________________________________________   FINAL CLINICAL IMPRESSION(S) / ED DIAGNOSES  Final diagnoses:  Epigastric pain  Alcohol-induced acute pancreatitis without infection or necrosis  Intractable pain  Alcohol withdrawal syndrome without complication [facility])     ED Discharge Orders     None        Note:  This document was prepared using Dragon  voice recognition software and may include unintentional dictation errors.    Irean Hong, MD 05/28/21 [phone]

## 2021-05-28 NOTE — Assessment & Plan Note (Signed)
Start scheduled librium and prn IV ativan

## 2021-05-28 NOTE — Subjective & Objective (Signed)
CC: recurrent abd pain HPI: 40 year old African-American male with a history of recurrent alcoholic pancreatitis, chronic alcohol abuse, chronic tobacco use, hypertension, reflux presents to the ER today for the second time in the last 40 hours due to worsening abdominal pain.  Patient states that he drank large amounts of alcohol on August 6 and August 7.  He states he consumes at least 16 ounces of whiskey on each night plus several beers.  Patient normally drinks approximately 4 to 6 ounce of alcohol each day Monday through Thursday and drinks large quantities of alcohol Friday Saturday and Sunday.  Patient states he started developing abdominal pain on Monday and Tuesday.  Started having nausea.  Came to the ER on Wednesday.  CT scan demonstrated uncomplicated pancreatitis.  Lipase was 78.  Patient given some parenteral opiates and discharged home with prescription for oxycodone.  Patient states he has been taking oxycodone 5 mg without relief.  Presented back to the ER today.  Repeat lipase was 75.  Ultrasound the abdomen demonstrated normal gallbladder.  Slightly distended.  No gallstones.  Patient noted be slightly tremulous and concern for uncomplicated alcohol withdrawal.  Due to continued abdominal pain and possible alcohol withdrawal, [facility] hospitalist consulted for admission.

## 2021-05-28 NOTE — ED Notes (Signed)
Admitting MD at bedside.

## 2021-05-28 NOTE — Assessment & Plan Note (Signed)
Pt as no interest in quitting smoking. Nicotine patch ordered.

## 2021-05-28 NOTE — H&P (Signed)
History and Physical    Mario Beck CHE:[id number] DOB: 1981-08-09 DOA: 05/28/2021  PCP: Pcp, No   Patient coming from: Home  I have personally briefly reviewed patient's old medical records in [facility]  CC: recurrent abd pain HPI: 40 year old African-American male with a history of recurrent alcoholic pancreatitis, chronic alcohol abuse, chronic tobacco use, hypertension, reflux presents to the ER today for the second time in the last 40 hours due to worsening abdominal pain.  Patient states that he drank large amounts of alcohol on August 6 and August 7.  He states he consumes at least 16 ounces of whiskey on each night plus several beers.  Patient normally drinks approximately 4 to 6 ounce of alcohol each day Monday through Thursday and drinks large quantities of alcohol Friday Saturday and Sunday.  Patient states he started developing abdominal pain on Monday and Tuesday.  Started having nausea.  Came to the ER on Wednesday.  CT scan demonstrated uncomplicated pancreatitis.  Lipase was 78.  Patient given some parenteral opiates and discharged home with prescription for oxycodone.  Patient states he has been taking oxycodone 5 mg without relief.  Presented back to the ER today.  Repeat lipase was 75.  Ultrasound the abdomen demonstrated normal gallbladder.  Slightly distended.  No gallstones.  Patient noted be slightly tremulous and concern for uncomplicated alcohol withdrawal.  Due to continued abdominal pain and possible alcohol withdrawal, [facility] hospitalist consulted for admission.   ED Course: pt seen in ER. Given IV dilaudid and CIWA protocol started. RUQ U/S negative for gallstones.  Review of Systems:  Review of Systems  Constitutional:  Positive for malaise/fatigue.  HENT: Negative.    Eyes: Negative.   Respiratory: Negative.    Cardiovascular: Negative.   Gastrointestinal:  Positive for abdominal pain, nausea and vomiting.  Genitourinary: Negative.   Musculoskeletal:  Negative.   Skin: Negative.   Neurological: Negative.   Endo/Heme/Allergies: Negative.   Psychiatric/Behavioral: Negative.    All other systems reviewed and are negative.  Past Medical History:  Diagnosis Date   Alcohol abuse    Asthma    Depression    Hypertension     Past Surgical History:  Procedure Laterality Date   FRACTURE SURGERY     Left leg as a kid     reports that he has been smoking cigarettes. He has been smoking an average of 1 pack per day. His smokeless tobacco use includes snuff. He reports current alcohol use of about 7.0 standard drinks per week. He reports that he does not use drugs.  Allergies  Allergen Reactions   Lisinopril Swelling    Angioedema    Shellfish Allergy Swelling    Family History  Problem Relation Age of Onset   Colon cancer Neg Hx    Esophageal cancer Neg Hx     Prior to Admission medications   Medication Sig Start Date End Date Taking? Authorizing Provider  furosemide (LASIX) 40 MG tablet Take 1 tablet (40 mg total) by mouth daily. 12/11/20 12/11/21 Yes Gilles Chiquito, MD  LORazepam (ATIVAN) 1 MG tablet Take 1 tablet (1 mg total) by mouth every 8 (eight) hours as needed for anxiety. 05/05/21  Yes Irean Hong, MD  metoprolol succinate (TOPROL-XL) 50 MG 24 hr tablet Take 50 mg by mouth daily. 05/12/21  Yes [provider]  ondansetron (ZOFRAN) 4 MG tablet Take 4 mg by mouth every 8 (eight) hours as needed. 05/05/21  Yes [provider]  oxyCODONE-acetaminophen (  PERCOCET/ROXICET) 5-325 MG tablet Take 1 tablet by mouth every 4 (four) hours as needed for severe pain. 05/05/21  Yes Irean Hong, MD    Physical Exam: Vitals:   05/27/21 2243 05/28/21 0056 05/28/21 0430 05/28/21 0458  BP:  (!) 139/105 (!) 145/102 (!) 145/102  Pulse:  (!) 125 (!) 115 (!) 115  Resp:  18  17  Temp:  98.4 F (36.9 C)  98.3 F (36.8 C)  TempSrc:    Oral  SpO2:  99%  97%  Weight: 83 kg     Height: 6\' 3"  (1.905 m)       Physical  Exam Vitals and nursing note reviewed.  Constitutional:      General: He is not in acute distress.    Appearance: He is well-developed and normal weight. He is not ill-appearing, toxic-appearing or diaphoretic.  HENT:     Head: Normocephalic and atraumatic.  Eyes:     Extraocular Movements: Extraocular movements intact.  Cardiovascular:     Rate and Rhythm: Regular rhythm. Tachycardia present.     Heart sounds: Normal heart sounds.  Pulmonary:     Effort: Pulmonary effort is normal. No respiratory distress.     Breath sounds: Normal breath sounds. No wheezing.  Abdominal:     General: Abdomen is flat. Bowel sounds are normal. There is no distension.     Tenderness: There is abdominal tenderness in the epigastric area. There is no guarding or rebound. Negative signs include Murphy's sign.  Genitourinary:    Testes:        Right: Swelling not present.        Left: Swelling not present.  Skin:    General: Skin is warm and dry.     Capillary Refill: Capillary refill takes less than 2 seconds.  Neurological:     General: No focal deficit present.     Mental Status: He is alert and oriented to person, place, and time.     Labs on Admission: I have personally reviewed following labs and imaging studies  CBC: Recent Labs  Lab 05/26/21 1553 05/27/21 2244  WBC 5.1 9.0  HGB 14.4 13.9  HCT 39.8 38.1*  MCV 84.0 86.2  PLT 147* 128*   Basic Metabolic Panel: Recent Labs  Lab 05/26/21 1553 05/27/21 2244  NA 141 137  K 3.7 3.3*  CL 99 97*  CO2 24 27  GLUCOSE 107* 304*  BUN 22* 13  CREATININE 1.19 1.34*  CALCIUM 9.0 8.9   GFR: Estimated Creatinine Clearance: 87.7 mL/min (A) (by C-G formula based on SCr of 1.34 mg/dL (H)). Liver Function Tests: Recent Labs  Lab 05/26/21 1553 05/27/21 2244  AST 76* 56*  ALT 44 35  ALKPHOS 142* 142*  BILITOT 2.0* 1.7*  PROT 7.2 7.4  ALBUMIN 3.7 3.9   Recent Labs  Lab 05/26/21 1553 05/27/21 2244  LIPASE 78* 75*   No results for  input(s): AMMONIA in the last 168 hours. Coagulation Profile: No results for input(s): INR, PROTIME in the last 168 hours. Cardiac Enzymes: No results for input(s): CKTOTAL, CKMB, CKMBINDEX, TROPONINI in the last 168 hours. BNP (last 3 results) No results for input(s): PROBNP in the last 8760 hours. HbA1C: No results for input(s): HGBA1C in the last 72 hours. CBG: No results for input(s): GLUCAP in the last 168 hours. Lipid Profile: No results for input(s): CHOL, HDL, LDLCALC, TRIG, CHOLHDL, LDLDIRECT in the last 72 hours. Thyroid Function Tests: No results for input(s): TSH, T4TOTAL, FREET4, T3FREE,  THYROIDAB in the last 72 hours. Anemia Panel: No results for input(s): VITAMINB12, FOLATE, FERRITIN, TIBC, IRON, RETICCTPCT in the last 72 hours. Urine analysis:    Component Value Date/Time   COLORURINE YELLOW (A) 05/27/2021 2244   APPEARANCEUR HAZY (A) 05/27/2021 2244   LABSPEC 1.018 05/27/2021 2244   PHURINE 5.0 05/27/2021 2244   GLUCOSEU 50 (A) 05/27/2021 2244   HGBUR SMALL (A) 05/27/2021 2244   BILIRUBINUR NEGATIVE 05/27/2021 2244   KETONESUR NEGATIVE 05/27/2021 2244   PROTEINUR 100 (A) 05/27/2021 2244   NITRITE NEGATIVE 05/27/2021 2244   LEUKOCYTESUR TRACE (A) 05/27/2021 2244    Radiological Exams on Admission: I have personally reviewed images CT ABDOMEN PELVIS W CONTRAST  Result Date: 05/26/2021 CLINICAL DATA:  Acute pancreatitis, evaluate complication. Biliary stone. EXAM: CT ABDOMEN AND PELVIS WITH CONTRAST TECHNIQUE: Multidetector CT imaging of the abdomen and pelvis was performed using the standard protocol following bolus administration of intravenous contrast. CONTRAST:  OMNIPAQUE IOHEXOL 350 MG/ML SOLN COMPARISON:  CT May 04, 2021. FINDINGS: Lower chest: Mild four-chamber cardiac enlargement. No acute abnormality. Hepatobiliary: Diffuse hepatic steatosis. No suspicious hepatic lesion. Gallbladder is dilated without wall thickening or pericholecystic fluid. No  biliary ductal dilation. Pancreas: Edematous appearance of the pancreatic head with peripancreatic fluid. No pancreatic ductal dilation. Spleen: Within normal limits. Adrenals/Urinary Tract: 1.4 cm right adrenal nodule and left thickening with a few small adrenal nodules measuring up to 1 cm, stable dating back to CT chest December 04, 2017 and likely representing hyperplasia and small benign adrenal adenomas. Kidneys are normal, without renal calculi, solid enhancing lesion, or hydronephrosis. Bladder is unremarkable. Stomach/Bowel: Stomach is within normal limits. Appendix appears normal. No evidence of bowel wall thickening, distention, or inflammatory changes. Vascular/Lymphatic: The portal, splenic and superior mesenteric veins are patent. No abdominal aortic aneurysm. No pathologically enlarged abdominal or pelvic lymph nodes. Reproductive: Prostate is unremarkable. Other: No walled off fluid collections. No pneumoperitoneum. No portal venous gas. Musculoskeletal: No acute or significant osseous findings. IMPRESSION: 1. Acute uncomplicated interstitial pancreatitis. No evidence of pancreatic necrosis or walled off fluid collection. 2. Distended gallbladder without wall thickening or pericholecystic fluid, likely reflecting fasting state. Correlation with laboratory values and tenderness to palpation is suggested. 3. Hepatic steatosis. Electronically Signed   By: Maudry Mayhew MD   On: 05/26/2021 23:24   US ABDOMEN LIMITED RUQ (LIVER/GB)  Result Date: 05/28/2021 CLINICAL DATA:  Epigastric pain EXAM: ULTRASOUND ABDOMEN LIMITED RIGHT UPPER QUADRANT COMPARISON:  CT 05/26/2021 FINDINGS: Gallbladder: Moderate gallbladder distension (diameter 4.7 cm, length 10.2 cm). No gallbladder wall thickening or pericholecystic fluid. No visible echogenic shadowing gallstones or biliary sludge. Sonographic Eulah Pont sign is reportedly negative. Common bile duct: Diameter: 3.8 mm, nondilated. Liver: Echogenic 1.4 x 1 x 2.2 cm  focus adjacent the porta hepatis corresponding to a geographic region of hypoattenuation on narrow windows CT imaging may reflect more focal fatty infiltration in this location or hemangioma. No other focal liver lesion. Otherwise normal hepatic echogenicity. Smooth surface contour. Portal vein is patent on color Doppler imaging with normal direction of blood flow towards the liver. Other: None. IMPRESSION: Isolated gallbladder distension without supportive sonographic features of acute cholecystitis or biliary ductal dilatation. Can be related to a fasting state however there is persisting concern, HIDA could be obtained. Echogenic 2.2 cm focus adjacent the porta hepatis most likely to reflect an area of focal fatty infiltration versus less likely hepatic hemangioma or other primary hepatic lesion particularly given a geographic hypoattenuation seen in this vicinity  on comparison CT (CT series 2, image 25). However, if further characterization is clinically warranted based on patient risk factors and demographics, outpatient nonemergent hepatic MRI with contrast could be obtained when patient is better able to tolerate. Electronically Signed   By: Kreg Shropshire M.D.   On: 05/28/2021 03:40    EKG: I have personally reviewed EKG: sinus tachycardia   Assessment/Plan Principal Problem:   Acute alcoholic pancreatitis Active Problems:   Alcohol withdrawal syndrome without complication (HCC)   Benign essential HTN   Tobacco abuse   GERD (gastroesophageal reflux disease)    Acute alcoholic pancreatitis Admit to obs medical bed. Repeat lipase. NPO. IVF. Prn oxycodone for pain. Pt counseled extensively at bedside that he needs to immediate stop consuming alcohol. Discussed that he is endangering his life if he continues to drink alcohol.  Alcohol withdrawal syndrome without complication (HCC) Start scheduled librium and prn IV ativan  Benign essential HTN Continue with toprol-xl. Give 1 dose of IV  lopressor in ER.  Tobacco abuse Pt as no interest in quitting smoking. Nicotine patch ordered.  GERD (gastroesophageal reflux disease) Continue with PPI.  DVT prophylaxis: SCDs Code Status: Full Code Family Communication: no family at bedside  Disposition Plan: DC to home  Consults called: none  Admission status: Observation, Med-Surg   Carollee Herter, DO [facility] 05/28/2021, 8:16 AM

## 2021-05-28 NOTE — Assessment & Plan Note (Signed)
Admit to obs medical bed. Repeat lipase. NPO. IVF. Prn oxycodone for pain. Pt counseled extensively at bedside that he needs to immediate stop consuming alcohol. Discussed that he is endangering his life if he continues to drink alcohol.

## 2021-05-28 NOTE — ED Notes (Signed)
Pt requesting additional pain meds for abd pain, Dr Reubin Milan via secure chat

## 2021-05-29 DIAGNOSIS — R1013 Epigastric pain: Secondary | ICD-10-CM

## 2021-05-29 LAB — CBC WITH DIFFERENTIAL/PLATELET
Abs Immature Granulocytes: 0.01 10*3/uL (ref 0.00–0.07)
Basophils Absolute: 0 10*3/uL (ref 0.0–0.1)
Basophils Relative: 0 %
Eosinophils Absolute: 0.1 10*3/uL (ref 0.0–0.5)
Eosinophils Relative: 1 %
HCT: 33.7 % — ABNORMAL LOW (ref 39.0–52.0)
Hemoglobin: 11.8 g/dL — ABNORMAL LOW (ref 13.0–17.0)
Immature Granulocytes: 0 %
Lymphocytes Relative: 29 %
Lymphs Abs: 1.7 10*3/uL (ref 0.7–4.0)
MCH: 29.9 pg (ref 26.0–34.0)
MCHC: 35 g/dL (ref 30.0–36.0)
MCV: 85.3 fL (ref 80.0–100.0)
Monocytes Absolute: 0.4 10*3/uL (ref 0.1–1.0)
Monocytes Relative: 7 %
Neutro Abs: 3.7 10*3/uL (ref 1.7–7.7)
Neutrophils Relative %: 63 %
Platelets: 89 10*3/uL — ABNORMAL LOW (ref 150–400)
RBC: 3.95 MIL/uL — ABNORMAL LOW (ref 4.22–5.81)
RDW: 14.9 % (ref 11.5–15.5)
WBC: 5.9 10*3/uL (ref 4.0–10.5)
nRBC: 0 % (ref 0.0–0.2)

## 2021-05-29 LAB — LIPASE, BLOOD: Lipase: 29 U/L (ref 11–51)

## 2021-05-29 LAB — COMPREHENSIVE METABOLIC PANEL
ALT: 24 U/L (ref 0–44)
AST: 33 U/L (ref 15–41)
Albumin: 3.2 g/dL — ABNORMAL LOW (ref 3.5–5.0)
Alkaline Phosphatase: 120 U/L (ref 38–126)
Anion gap: 10 (ref 5–15)
BUN: 13 mg/dL (ref 6–20)
CO2: 26 mmol/L (ref 22–32)
Calcium: 8.6 mg/dL — ABNORMAL LOW (ref 8.9–10.3)
Chloride: 99 mmol/L (ref 98–111)
Creatinine, Ser: 1.06 mg/dL (ref 0.61–1.24)
GFR, Estimated: >60 mL/min (ref >60)
Glucose, Bld: 167 mg/dL — ABNORMAL HIGH (ref 70–99)
Potassium: 3.3 mmol/L — ABNORMAL LOW (ref 3.5–5.1)
Sodium: 135 mmol/L (ref 135–145)
Total Bilirubin: 1.7 mg/dL — ABNORMAL HIGH (ref 0.3–1.2)
Total Protein: 6.2 g/dL — ABNORMAL LOW (ref 6.5–8.1)

## 2021-05-29 LAB — URINE CULTURE: Culture: NO GROWTH

## 2021-05-29 MED ORDER — ADULT MULTIVITAMIN W/MINERALS CH: 1.0000 | ORAL_TABLET | Freq: Every day | ORAL | 0 refills | Status: DC

## 2021-05-29 MED ORDER — THIAMINE HCL 100 MG PO TABS: 100.0000 mg | ORAL_TABLET | Freq: Every day | ORAL | 0 refills | Status: DC

## 2021-05-29 NOTE — Discharge Summary (Signed)
Physician Discharge Summary  Mario Beck KPV:[id number] DOB: August 26, 1981 DOA: 05/28/2021  PCP: Pcp, No  Admit date: 05/28/2021 Discharge date: 05/29/2021  Admitted From: Home Disposition: Home  Recommendations for Outpatient Follow-up:  Follow up with PCP in 1-2 weeks Please obtain BMP/CBC in one week Please follow up on the following pending results: None  Home Health: No Equipment/Devices: None Discharge Condition: Stable CODE STATUS: Full Diet recommendation: Heart Healthy   Brief/Interim Summary: 40 year old African-American male with a history of recurrent alcoholic pancreatitis, chronic alcohol abuse, chronic tobacco use, hypertension, reflux presents to the ER on 05/28/2021 for the second time in the last 40 hours due to worsening abdominal pain.  Patient states that he drank large amounts of alcohol on August 6 and August 7.  He states he consumes at least 16 ounces of whiskey on each night plus several beers.  Patient normally drinks approximately 4 to 6 ounce of alcohol each day Monday through Thursday and drinks large quantities of alcohol Friday Saturday and Sunday.  Patient states he started developing abdominal pain on Monday and Tuesday.  Started having nausea.  Came to the ER on Wednesday.  CT scan demonstrated uncomplicated pancreatitis.  Lipase was 78.  Patient given some parenteral opiates and discharged home with prescription for oxycodone.  Patient states he has been taking oxycodone 5 mg without relief.  Presented back to the ER today.  Repeat lipase was 75, which decreased to 25 next morning.  Ultrasound the abdomen demonstrated normal gallbladder.  Slightly distended.  No gallstones.  Patient was admitted for acute alcoholic pancreatitis.  Improved overnight with IV fluid.  Denies any more abdominal pain, nausea or vomiting.  Able to tolerate diet.  Had an extensive counseling against alcohol use and seek medical attention if needed for withdrawal symptoms.  Patient was  also placed on CIWA protocol while in the hospital.  Did not noticed any significant withdrawals.  Patient will remain high risk for withdrawal if stopped drinking or recurrent admissions with pancreatitis if continued to drink.  He was requesting discharge.  Earlier in the day he wants to leave AMA stating that he is feeling better and does not want to stay in hospital.  Patient was also counseled against tobacco abuse and he did not show any interest in quitting at this time.  Will need regular counseling by PCP.  He will continue rest of his home medications and follow-up with his providers.  Discharge Diagnoses:  Principal Problem:   Acute alcoholic pancreatitis Active Problems:   Tobacco abuse   GERD (gastroesophageal reflux disease)   Alcohol withdrawal syndrome without complication (HCC)   Benign essential HTN   Epigastric pain   Discharge Instructions  Discharge Instructions     Diet - low sodium heart healthy   Complete by: As directed    Discharge instructions   Complete by: As directed    It was pleasure taking care of you. As we discussed it is very important that you stop drinking in order to avoid frequent pancreatitis as that can lead to more life-threatening complications including cancer. Keep yourself well-hydrated and follow-up with your primary care doctor for further recommendations.   Increase activity slowly   Complete by: As directed       Allergies as of 05/29/2021       Reactions   Lisinopril Swelling   Angioedema   Shellfish Allergy Swelling        Medication List     TAKE these medications  furosemide 40 MG tablet Commonly known as: Lasix Take 1 tablet (40 mg total) by mouth daily.   LORazepam 1 MG tablet Commonly known as: ATIVAN Take 1 tablet (1 mg total) by mouth every 8 (eight) hours as needed for anxiety.   metoprolol succinate 50 MG 24 hr tablet Commonly known as: TOPROL-XL Take 50 mg by mouth daily.   multivitamin with  minerals Tabs tablet Take 1 tablet by mouth daily. Start taking on: May 30, 2021   ondansetron 4 MG tablet Commonly known as: ZOFRAN Take 4 mg by mouth every 8 (eight) hours as needed.   oxyCODONE-acetaminophen 5-325 MG tablet Commonly known as: PERCOCET/ROXICET Take 1 tablet by mouth every 4 (four) hours as needed for severe pain.   thiamine 100 MG tablet Take 1 tablet (100 mg total) by mouth daily. Start taking on: May 30, 2021        Allergies  Allergen Reactions   Lisinopril Swelling    Angioedema    Shellfish Allergy Swelling    Consultations: None  Procedures/Studies: CT ABDOMEN PELVIS W CONTRAST  Result Date: 05/26/2021 CLINICAL DATA:  Acute pancreatitis, evaluate complication. Biliary stone. EXAM: CT ABDOMEN AND PELVIS WITH CONTRAST TECHNIQUE: Multidetector CT imaging of the abdomen and pelvis was performed using the standard protocol following bolus administration of intravenous contrast. CONTRAST:  100mL OMNIPAQUE IOHEXOL 350 MG/ML SOLN COMPARISON:  CT May 04, 2021. FINDINGS: Lower chest: Mild four-chamber cardiac enlargement. No acute abnormality. Hepatobiliary: Diffuse hepatic steatosis. No suspicious hepatic lesion. Gallbladder is dilated without wall thickening or pericholecystic fluid. No biliary ductal dilation. Pancreas: Edematous appearance of the pancreatic head with peripancreatic fluid. No pancreatic ductal dilation. Spleen: Within normal limits. Adrenals/Urinary Tract: 1.4 cm right adrenal nodule and left thickening with a few small adrenal nodules measuring up to 1 cm, stable dating back to CT chest December 04, 2017 and likely representing hyperplasia and small benign adrenal adenomas. Kidneys are normal, without renal calculi, solid enhancing lesion, or hydronephrosis. Bladder is unremarkable. Stomach/Bowel: Stomach is within normal limits. Appendix appears normal. No evidence of bowel wall thickening, distention, or inflammatory changes.  Vascular/Lymphatic: The portal, splenic and superior mesenteric veins are patent. No abdominal aortic aneurysm. No pathologically enlarged abdominal or pelvic lymph nodes. Reproductive: Prostate is unremarkable. Other: No walled off fluid collections. No pneumoperitoneum. No portal venous gas. Musculoskeletal: No acute or significant osseous findings. IMPRESSION: 1. Acute uncomplicated interstitial pancreatitis. No evidence of pancreatic necrosis or walled off fluid collection. 2. Distended gallbladder without wall thickening or pericholecystic fluid, likely reflecting fasting state. Correlation with laboratory values and tenderness to palpation is suggested. 3. Hepatic steatosis. Electronically Signed   By: Maudry MayhewJeffrey  Waltz MD   On: 05/26/2021 23:24   CT ABDOMEN PELVIS W CONTRAST  Result Date: 05/04/2021 CLINICAL DATA:  Nonlocalized acute abdominal pain. Fatigue. Nausea vomiting diarrhea. Possible pancreatitis. EXAM: CT ABDOMEN AND PELVIS WITH CONTRAST TECHNIQUE: Multidetector CT imaging of the abdomen and pelvis was performed using the standard protocol following bolus administration of intravenous contrast. CONTRAST:  100mL OMNIPAQUE IOHEXOL 300 MG/ML  SOLN COMPARISON:  None. FINDINGS: Lower chest: Interval decrease in size of a trace left pleural effusion. Partially visualized likely enlarged left ventricle. Hepatobiliary: The hepatic parenchyma is diffusely hypodense compared to the splenic parenchyma consistent with fatty infiltration. No focal liver abnormality. No gallstones, gallbladder wall thickening, or pericholecystic fluid. No biliary dilatation. Pancreas: No focal lesion. Normal pancreatic contour. No surrounding inflammatory changes. No main pancreatic ductal dilatation. Spleen: Normal in size without focal abnormality.  Adrenals/Urinary Tract: Slight hyperplasia of the left adrenal gland with no definite nodularity. No right adrenal nodule. Bilateral kidneys enhance symmetrically. Subcentimeter  hypodensities are too small to characterize. No hydronephrosis. No hydroureter. The urinary bladder is decompressed with circumferential urinary bladder wall thickening. Stomach/Bowel: Stomach is within normal limits. No evidence of bowel wall thickening or dilatation. Appendix appears normal. Vascular/Lymphatic: No abdominal aorta or iliac aneurysm. No abdominal, pelvic, or inguinal lymphadenopathy. Reproductive: Prostate is unremarkable. Other: No intraperitoneal free fluid. No intraperitoneal free gas. No organized fluid collection. Musculoskeletal: No abdominal wall hernia or abnormality. No suspicious lytic or blastic osseous lesions. No acute displaced fracture. IMPRESSION: 1. No definite CT findings of acute pancreatitis. 2. Urinary bladder wall thickening likely due to under distension. Correlate with urinalysis to exclude underlying infection. 3. Hepatic steatosis. 4. Cardiomegaly. Electronically Signed   By: Tish Frederickson M.D.   On: 05/04/2021 21:27   DG Chest Portable 1 View  Result Date: 05/04/2021 CLINICAL DATA:  Cough. EXAM: PORTABLE CHEST 1 VIEW COMPARISON:  Feb 19, 2021. FINDINGS: The heart size and mediastinal contours are within normal limits. Both lungs are clear. The visualized skeletal structures are unremarkable. IMPRESSION: No active disease. Electronically Signed   By: Lupita Raider M.D.   On: 05/04/2021 20:35   US ABDOMEN LIMITED RUQ (LIVER/GB)  Result Date: 05/28/2021 CLINICAL DATA:  Epigastric pain EXAM: ULTRASOUND ABDOMEN LIMITED RIGHT UPPER QUADRANT COMPARISON:  CT 05/26/2021 FINDINGS: Gallbladder: Moderate gallbladder distension (diameter 4.7 cm, length 10.2 cm). No gallbladder wall thickening or pericholecystic fluid. No visible echogenic shadowing gallstones or biliary sludge. Sonographic Eulah Pont sign is reportedly negative. Common bile duct: Diameter: 3.8 mm, nondilated. Liver: Echogenic 1.4 x 1 x 2.2 cm focus adjacent the porta hepatis corresponding to a geographic region  of hypoattenuation on narrow windows CT imaging may reflect more focal fatty infiltration in this location or hemangioma. No other focal liver lesion. Otherwise normal hepatic echogenicity. Smooth surface contour. Portal vein is patent on color Doppler imaging with normal direction of blood flow towards the liver. Other: None. IMPRESSION: Isolated gallbladder distension without supportive sonographic features of acute cholecystitis or biliary ductal dilatation. Can be related to a fasting state however there is persisting concern, HIDA could be obtained. Echogenic 2.2 cm focus adjacent the porta hepatis most likely to reflect an area of focal fatty infiltration versus less likely hepatic hemangioma or other primary hepatic lesion particularly given a geographic hypoattenuation seen in this vicinity on comparison CT (CT series 2, image 25). However, if further characterization is clinically warranted based on patient risk factors and demographics, outpatient nonemergent hepatic MRI with contrast could be obtained when patient is better able to tolerate. Electronically Signed   By: Kreg Shropshire M.D.   On: 05/28/2021 03:40    Subjective: Patient was seen and examined today.  He was feeling much improved, denies any more abdominal pain, nausea or vomiting.  Able to tolerate diet.  Wants to go home.  Girlfriend at bedside. Discussed about staying away from alcohol and how he can seek help if needed.  Patient seems understanding.  Discharge Exam: Vitals:   05/29/21 0419 05/29/21 0800  BP: (!) 109/94 (!) 120/100  Pulse: (!) 101 (!) 104  Resp: 20 20  Temp: 98.7 F (37.1 C) 97.9 F (36.6 C)  SpO2: 100% 100%   Vitals:   05/28/21 2056 05/29/21 0009 05/29/21 0419 05/29/21 0800  BP: (!) 122/100 116/89 (!) 109/94 (!) 120/100  Pulse: (!) 108 (!) 104 (!) 101 Marland Kitchen)  104  Resp: 20 18 20 20   Temp: 97.8 F (36.6 C) 97.9 F (36.6 C) 98.7 F (37.1 C) 97.9 F (36.6 C)  TempSrc: Oral Oral Oral Oral  SpO2: 100% 100%  100% 100%  Weight:      Height:        General: Pt is alert, awake, not in acute distress Cardiovascular: RRR, S1/S2 +, no rubs, no gallops Respiratory: CTA bilaterally, no wheezing, no rhonchi Abdominal: Soft, NT, ND, bowel sounds + Extremities: no edema, no cyanosis   The results of significant diagnostics from this hospitalization (including imaging, microbiology, ancillary and laboratory) are listed below for reference.    Microbiology: Recent Results (from the past 240 hour(s))  Urine Culture     Status: None   Collection Time: 05/27/21 10:44 PM   Specimen: Urine, Clean Catch  Result Value Ref Range Status   Specimen Description   Final    URINE, CLEAN CATCH Performed at [name], [address] [place], [place] [address]    Special Requests   Final    NONE Performed at [facility], [address] [place], [place] [address]    Culture   Final    NO GROWTH Performed at [facility], [address], [place], [place] [address]    Report Status 05/29/2021 FINAL  Final  Resp Panel by RT-PCR (Flu A&B, Covid) Nasopharyngeal Swab     Status: None   Collection Time: 05/28/21  2:31 AM   Specimen: Nasopharyngeal Swab; Nasopharyngeal(NP) swabs in vial transport medium  Result Value Ref Range Status   SARS Coronavirus 2 by RT PCR NEGATIVE NEGATIVE Final    Comment: (NOTE) SARS-CoV-2 target nucleic acids are NOT DETECTED.  The SARS-CoV-2 RNA is generally detectable in upper respiratory specimens during the acute phase of infection. The lowest concentration of SARS-CoV-2 viral copies this assay can detect is 138 copies/mL. A negative result does not preclude SARS-Cov-2 infection and should not be used as the sole basis for treatment or other patient management decisions. A negative result may occur with  improper specimen collection/handling, submission of specimen other than nasopharyngeal swab, presence of viral mutation(s) within the areas  targeted by this assay, and inadequate number of viral copies(<138 copies/mL). A negative result must be combined with clinical observations, patient history, and epidemiological information. The expected result is Negative.  Fact Sheet for Patients:  BloggerCourse.com  Fact Sheet for Healthcare Providers:  SeriousBroker.it  This test is no t yet approved or cleared by the [place] FDA and  has been authorized for detection and/or diagnosis of SARS-CoV-2 by FDA under an Emergency Use Authorization (EUA). This EUA will remain  in effect (meaning this test can be used) for the duration of the COVID-19 declaration under Section 564(b)(1) of the Act, 21 U.S.C.section 360bbb-3(b)(1), unless the authorization is terminated  or revoked sooner.       Influenza A by PCR NEGATIVE NEGATIVE Final   Influenza B by PCR NEGATIVE NEGATIVE Final    Comment: (NOTE) The Xpert Xpress SARS-CoV-2/FLU/RSV plus assay is intended as an aid in the diagnosis of influenza from Nasopharyngeal swab specimens and should not be used as a sole basis for treatment. Nasal washings and aspirates are unacceptable for Xpert Xpress SARS-CoV-2/FLU/RSV testing.  Fact Sheet for Patients: BloggerCourse.com  Fact Sheet for Healthcare Providers: SeriousBroker.it  This test is not yet approved or cleared by the [place] FDA and has been authorized for detection and/or diagnosis of SARS-CoV-2 by  FDA under an Emergency Use Authorization (EUA). This EUA will remain in effect (meaning this test can be used) for the duration of the COVID-19 declaration under Section 564(b)(1) of the Act, 21 U.S.C. section 360bbb-3(b)(1), unless the authorization is terminated or revoked.  Performed at [facility], [address] [place], [place] [address]      Labs: BNP (last 3 results) Recent Labs     12/11/20 0315  BNP 1,843.9*   Basic Metabolic Panel: Recent Labs  Lab 05/26/21 1553 05/27/21 2244 05/28/21 1024 05/29/21 0529  NA 141 137  --  135  K 3.7 3.3*  --  3.3*  CL 99 97*  --  99  CO2 24 27  --  26  GLUCOSE 107* 304*  --  167*  BUN 22* 13  --  13  CREATININE 1.19 1.34* 1.22 1.06  CALCIUM 9.0 8.9  --  8.6*   Liver Function Tests: Recent Labs  Lab 05/26/21 1553 05/27/21 2244 05/29/21 0529  AST 76* 56* 33  ALT 44 35 24  ALKPHOS 142* 142* 120  BILITOT 2.0* 1.7* 1.7*  PROT 7.2 7.4 6.2*  ALBUMIN 3.7 3.9 3.2*   Recent Labs  Lab 05/26/21 1553 05/27/21 2244 05/29/21 0529  LIPASE 78* 75* 29   No results for input(s): AMMONIA in the last 168 hours. CBC: Recent Labs  Lab 05/26/21 1553 05/27/21 2244 05/28/21 1024 05/29/21 0529  WBC 5.1 9.0 7.3 5.9  NEUTROABS  --   --   --  3.7  HGB 14.4 13.9 12.9* 11.8*  HCT 39.8 38.1* 36.1* 33.7*  MCV 84.0 86.2 86.2 85.3  PLT 147* 128* 108* 89*   Cardiac Enzymes: No results for input(s): CKTOTAL, CKMB, CKMBINDEX, TROPONINI in the last 168 hours. BNP: Invalid input(s): POCBNP CBG: No results for input(s): GLUCAP in the last 168 hours. D-Dimer No results for input(s): DDIMER in the last 72 hours. Hgb A1c No results for input(s): HGBA1C in the last 72 hours. Lipid Profile No results for input(s): CHOL, HDL, LDLCALC, TRIG, CHOLHDL, LDLDIRECT in the last 72 hours. Thyroid function studies No results for input(s): TSH, T4TOTAL, T3FREE, THYROIDAB in the last 72 hours.  Invalid input(s): FREET3 Anemia work up No results for input(s): VITAMINB12, FOLATE, FERRITIN, TIBC, IRON, RETICCTPCT in the last 72 hours. Urinalysis    Component Value Date/Time   COLORURINE YELLOW (A) 05/27/2021 2244   APPEARANCEUR HAZY (A) 05/27/2021 2244   LABSPEC 1.018 05/27/2021 2244   PHURINE 5.0 05/27/2021 2244   GLUCOSEU 50 (A) 05/27/2021 2244   HGBUR SMALL (A) 05/27/2021 2244   BILIRUBINUR NEGATIVE 05/27/2021 2244   KETONESUR NEGATIVE  05/27/2021 2244   PROTEINUR 100 (A) 05/27/2021 2244   NITRITE NEGATIVE 05/27/2021 2244   LEUKOCYTESUR TRACE (A) 05/27/2021 2244   Sepsis Labs Invalid input(s): PROCALCITONIN,  WBC,  LACTICIDVEN Microbiology Recent Results (from the past 240 hour(s))  Urine Culture     Status: None   Collection Time: 05/27/21 10:44 PM   Specimen: Urine, Clean Catch  Result Value Ref Range Status   Specimen Description   Final    URINE, CLEAN CATCH Performed at [facility], [address], [place], [place] [address]    Special Requests   Final    NONE Performed at [facility], [address], [place], [place] [address]    Culture   Final    NO GROWTH Performed at [facility], [address], [place], [place] [address]    Report  Status 05/29/2021 FINAL  Final  Resp Panel by RT-PCR (Flu A&B, Covid) Nasopharyngeal Swab     Status: None   Collection Time: 05/28/21  2:31 AM   Specimen: Nasopharyngeal Swab; Nasopharyngeal(NP) swabs in vial transport medium  Result Value Ref Range Status   SARS Coronavirus 2 by RT PCR NEGATIVE NEGATIVE Final    Comment: (NOTE) SARS-CoV-2 target nucleic acids are NOT DETECTED.  The SARS-CoV-2 RNA is generally detectable in upper respiratory specimens during the acute phase of infection. The lowest concentration of SARS-CoV-2 viral copies this assay can detect is 138 copies/mL. A negative result does not preclude SARS-Cov-2 infection and should not be used as the sole basis for treatment or other patient management decisions. A negative result may occur with  improper specimen collection/handling, submission of specimen other than nasopharyngeal swab, presence of viral mutation(s) within the areas targeted by this assay, and inadequate number of viral copies(<138 copies/mL). A negative result must be combined with clinical observations, patient history, and epidemiological information. The expected result is Negative.  Fact Sheet for  Patients:  BloggerCourse.com  Fact Sheet for Healthcare Providers:  SeriousBroker.it  This test is no t yet approved or cleared by the [place] FDA and  has been authorized for detection and/or diagnosis of SARS-CoV-2 by FDA under an Emergency Use Authorization (EUA). This EUA will remain  in effect (meaning this test can be used) for the duration of the COVID-19 declaration under Section 564(b)(1) of the Act, 21 U.S.C.section 360bbb-3(b)(1), unless the authorization is terminated  or revoked sooner.       Influenza A by PCR NEGATIVE NEGATIVE Final   Influenza B by PCR NEGATIVE NEGATIVE Final    Comment: (NOTE) The Xpert Xpress SARS-CoV-2/FLU/RSV plus assay is intended as an aid in the diagnosis of influenza from Nasopharyngeal swab specimens and should not be used as a sole basis for treatment. Nasal washings and aspirates are unacceptable for Xpert Xpress SARS-CoV-2/FLU/RSV testing.  Fact Sheet for Patients: BloggerCourse.com  Fact Sheet for Healthcare Providers: SeriousBroker.it  This test is not yet approved or cleared by the [place] FDA and has been authorized for detection and/or diagnosis of SARS-CoV-2 by FDA under an Emergency Use Authorization (EUA). This EUA will remain in effect (meaning this test can be used) for the duration of the COVID-19 declaration under Section 564(b)(1) of the Act, 21 U.S.C. section 360bbb-3(b)(1), unless the authorization is terminated or revoked.  Performed at [facility], [address] [place], [place] [address]     Time coordinating discharge: Over 30 minutes  SIGNED:  Arnetha Courser, MD  [facility] 05/29/2021, 1:24 PM  If 7PM-7AM, please contact night-coverage [email or web address]  This record has been created using Conservation officer, historic buildings. Errors have been sought and corrected,but may not  always be located. Such creation errors do not reflect on the standard of care.

## 2021-06-17 ENCOUNTER — Other Ambulatory Visit: Payer: Self-pay

## 2021-06-17 ENCOUNTER — Emergency Department: Payer: Self-pay

## 2021-06-17 ENCOUNTER — Inpatient Hospital Stay
Admission: EM | Admit: 2021-06-17 | Discharge: 2021-06-21 | DRG: 291 | Disposition: A | Discharge status: Home / Self Care | Payer: Self-pay | Attending: Family Medicine | Admitting: Family Medicine

## 2021-06-17 DIAGNOSIS — I10 Essential (primary) hypertension: Secondary | ICD-10-CM | POA: Diagnosis present

## 2021-06-17 DIAGNOSIS — E876 Hypokalemia: Secondary | ICD-10-CM | POA: Diagnosis present

## 2021-06-17 DIAGNOSIS — I5023 Acute on chronic systolic (congestive) heart failure: Secondary | ICD-10-CM | POA: Diagnosis present

## 2021-06-17 DIAGNOSIS — R7989 Other specified abnormal findings of blood chemistry: Secondary | ICD-10-CM | POA: Diagnosis present

## 2021-06-17 DIAGNOSIS — J4520 Mild intermittent asthma, uncomplicated: Secondary | ICD-10-CM | POA: Diagnosis present

## 2021-06-17 DIAGNOSIS — K219 Gastro-esophageal reflux disease without esophagitis: Secondary | ICD-10-CM | POA: Diagnosis present

## 2021-06-17 DIAGNOSIS — I426 Alcoholic cardiomyopathy: Secondary | ICD-10-CM | POA: Diagnosis present

## 2021-06-17 DIAGNOSIS — F32A Depression, unspecified: Secondary | ICD-10-CM | POA: Diagnosis present

## 2021-06-17 DIAGNOSIS — K859 Acute pancreatitis without necrosis or infection, unspecified: Secondary | ICD-10-CM | POA: Diagnosis present

## 2021-06-17 DIAGNOSIS — U071 COVID-19: Secondary | ICD-10-CM | POA: Diagnosis present

## 2021-06-17 DIAGNOSIS — Z91013 Allergy to seafood: Secondary | ICD-10-CM

## 2021-06-17 DIAGNOSIS — J439 Emphysema, unspecified: Secondary | ICD-10-CM | POA: Diagnosis present

## 2021-06-17 DIAGNOSIS — F1020 Alcohol dependence, uncomplicated: Secondary | ICD-10-CM | POA: Diagnosis present

## 2021-06-17 DIAGNOSIS — Z28311 Partially vaccinated for covid-19: Secondary | ICD-10-CM

## 2021-06-17 DIAGNOSIS — I509 Heart failure, unspecified: Secondary | ICD-10-CM

## 2021-06-17 DIAGNOSIS — Z79899 Other long term (current) drug therapy: Secondary | ICD-10-CM

## 2021-06-17 DIAGNOSIS — R0902 Hypoxemia: Secondary | ICD-10-CM | POA: Diagnosis present

## 2021-06-17 DIAGNOSIS — I081 Rheumatic disorders of both mitral and tricuspid valves: Secondary | ICD-10-CM | POA: Diagnosis present

## 2021-06-17 DIAGNOSIS — R778 Other specified abnormalities of plasma proteins: Secondary | ICD-10-CM

## 2021-06-17 DIAGNOSIS — Z888 Allergy status to other drugs, medicaments and biological substances status: Secondary | ICD-10-CM

## 2021-06-17 DIAGNOSIS — Y92239 Unspecified place in hospital as the place of occurrence of the external cause: Secondary | ICD-10-CM | POA: Diagnosis not present

## 2021-06-17 DIAGNOSIS — Z720 Tobacco use: Secondary | ICD-10-CM | POA: Diagnosis present

## 2021-06-17 DIAGNOSIS — T783XXA Angioneurotic edema, initial encounter: Secondary | ICD-10-CM | POA: Diagnosis not present

## 2021-06-17 DIAGNOSIS — I5043 Acute on chronic combined systolic (congestive) and diastolic (congestive) heart failure: Secondary | ICD-10-CM | POA: Diagnosis present

## 2021-06-17 DIAGNOSIS — I110 Hypertensive heart disease with heart failure: Principal | ICD-10-CM | POA: Diagnosis present

## 2021-06-17 DIAGNOSIS — F17210 Nicotine dependence, cigarettes, uncomplicated: Secondary | ICD-10-CM | POA: Diagnosis present

## 2021-06-17 DIAGNOSIS — T464X5A Adverse effect of angiotensin-converting-enzyme inhibitors, initial encounter: Secondary | ICD-10-CM | POA: Diagnosis not present

## 2021-06-17 LAB — COMPREHENSIVE METABOLIC PANEL
ALT: 35 U/L (ref 0–44)
AST: 50 U/L — ABNORMAL HIGH (ref 15–41)
Albumin: 3.5 g/dL (ref 3.5–5.0)
Alkaline Phosphatase: 172 U/L — ABNORMAL HIGH (ref 38–126)
Anion gap: 12 (ref 5–15)
BUN: 21 mg/dL — ABNORMAL HIGH (ref 6–20)
CO2: 24 mmol/L (ref 22–32)
Calcium: 8.7 mg/dL — ABNORMAL LOW (ref 8.9–10.3)
Chloride: 100 mmol/L (ref 98–111)
Creatinine, Ser: 1.17 mg/dL (ref 0.61–1.24)
GFR, Estimated: >60 mL/min (ref >60)
Glucose, Bld: 120 mg/dL — ABNORMAL HIGH (ref 70–99)
Potassium: 3.1 mmol/L — ABNORMAL LOW (ref 3.5–5.1)
Sodium: 136 mmol/L (ref 135–145)
Total Bilirubin: 1 mg/dL (ref 0.3–1.2)
Total Protein: 6.8 g/dL (ref 6.5–8.1)

## 2021-06-17 LAB — ETHANOL: Alcohol, Ethyl (B): <10 mg/dL (ref <10)

## 2021-06-17 LAB — CBC
HCT: 34.8 % — ABNORMAL LOW (ref 39.0–52.0)
Hemoglobin: 12.3 g/dL — ABNORMAL LOW (ref 13.0–17.0)
MCH: 30.1 pg (ref 26.0–34.0)
MCHC: 35.3 g/dL (ref 30.0–36.0)
MCV: 85.1 fL (ref 80.0–100.0)
Platelets: 201 10*3/uL (ref 150–400)
RBC: 4.09 MIL/uL — ABNORMAL LOW (ref 4.22–5.81)
RDW: 14.6 % (ref 11.5–15.5)
WBC: 6.7 10*3/uL (ref 4.0–10.5)
nRBC: 0.3 % — ABNORMAL HIGH (ref 0.0–0.2)

## 2021-06-17 LAB — RESP PANEL BY RT-PCR (FLU A&B, COVID) ARPGX2
Influenza A by PCR: NEGATIVE
Influenza B by PCR: NEGATIVE
SARS Coronavirus 2 by RT PCR: POSITIVE — AB

## 2021-06-17 LAB — LIPASE, BLOOD: Lipase: 18 U/L (ref 11–51)

## 2021-06-17 LAB — TROPONIN I (HIGH SENSITIVITY)
Troponin I (High Sensitivity): 42 ng/L — ABNORMAL HIGH (ref <18)
Troponin I (High Sensitivity): 43 ng/L — ABNORMAL HIGH (ref <18)

## 2021-06-17 LAB — BRAIN NATRIURETIC PEPTIDE: B Natriuretic Peptide: 3688.9 pg/mL — ABNORMAL HIGH (ref 0.0–100.0)

## 2021-06-17 LAB — D-DIMER, QUANTITATIVE: D-Dimer, Quant: 1.16 ug/mL-FEU — ABNORMAL HIGH (ref 0.00–0.50)

## 2021-06-17 LAB — MAGNESIUM: Magnesium: 1.7 mg/dL (ref 1.7–2.4)

## 2021-06-17 MED ORDER — THIAMINE HCL 100 MG PO TABS
100.0000 mg | ORAL_TABLET | Freq: Every day | ORAL | Status: DC
  Administered 2021-06-18 – 2021-06-21 (×4): 100 mg via ORAL
  Filled 2021-06-17 (×4): qty 1

## 2021-06-17 MED ORDER — METOPROLOL SUCCINATE ER 50 MG PO TB24
50.0000 mg | ORAL_TABLET | Freq: Every day | ORAL | Status: DC
  Administered 2021-06-18 – 2021-06-19 (×2): 50 mg via ORAL
  Filled 2021-06-17 (×2): qty 1

## 2021-06-17 MED ORDER — POTASSIUM CHLORIDE 10 MEQ/100ML IV SOLN
10.0000 meq | INTRAVENOUS | Status: AC
  Administered 2021-06-17 (×2): 10 meq via INTRAVENOUS
  Filled 2021-06-17 (×2): qty 100

## 2021-06-17 MED ORDER — FOLIC ACID 1 MG PO TABS
1.0000 mg | ORAL_TABLET | Freq: Every day | ORAL | Status: DC
  Administered 2021-06-18 – 2021-06-21 (×4): 1 mg via ORAL
  Filled 2021-06-17 (×4): qty 1

## 2021-06-17 MED ORDER — SODIUM CHLORIDE 0.9 % IV SOLN
200.0000 mg | Freq: Once | INTRAVENOUS | Status: AC
  Administered 2021-06-17: 200 mg via INTRAVENOUS
  Filled 2021-06-17: qty 200

## 2021-06-17 MED ORDER — FUROSEMIDE 10 MG/ML IJ SOLN
40.0000 mg | Freq: Two times a day (BID) | INTRAMUSCULAR | Status: DC
  Administered 2021-06-18 – 2021-06-19 (×3): 40 mg via INTRAVENOUS
  Filled 2021-06-17 (×3): qty 4

## 2021-06-17 MED ORDER — SODIUM CHLORIDE 0.9% FLUSH: 3.0000 mL | INTRAVENOUS | Status: DC | PRN

## 2021-06-17 MED ORDER — SODIUM CHLORIDE 0.9 % IV SOLN: 250.0000 mL | INTRAVENOUS | Status: DC | PRN

## 2021-06-17 MED ORDER — ALBUTEROL SULFATE HFA 108 (90 BASE) MCG/ACT IN AERS
2.0000 | INHALATION_SPRAY | Freq: Four times a day (QID) | RESPIRATORY_TRACT | Status: DC
  Administered 2021-06-18 – 2021-06-21 (×14): 2 via RESPIRATORY_TRACT
  Filled 2021-06-17: qty 6.7

## 2021-06-17 MED ORDER — MAGNESIUM SULFATE IN D5W 1-5 GM/100ML-% IV SOLN
1.0000 g | Freq: Once | INTRAVENOUS | Status: AC
  Administered 2021-06-17: 1 g via INTRAVENOUS
  Filled 2021-06-17: qty 100

## 2021-06-17 MED ORDER — ENOXAPARIN SODIUM 40 MG/0.4ML IJ SOSY
40.0000 mg | PREFILLED_SYRINGE | INTRAMUSCULAR | Status: DC
  Administered 2021-06-18 – 2021-06-20 (×3): 40 mg via SUBCUTANEOUS
  Filled 2021-06-17 (×3): qty 0.4

## 2021-06-17 MED ORDER — NICOTINE 21 MG/24HR TD PT24
21.0000 mg | MEDICATED_PATCH | Freq: Every day | TRANSDERMAL | Status: DC
  Administered 2021-06-18 – 2021-06-21 (×4): 21 mg via TRANSDERMAL
  Filled 2021-06-17 (×4): qty 1

## 2021-06-17 MED ORDER — LORAZEPAM 1 MG PO TABS
1.0000 mg | ORAL_TABLET | ORAL | Status: AC | PRN
  Administered 2021-06-18 (×2): 1 mg via ORAL
  Filled 2021-06-17 (×2): qty 1

## 2021-06-17 MED ORDER — ONDANSETRON HCL 4 MG PO TABS
4.0000 mg | ORAL_TABLET | Freq: Three times a day (TID) | ORAL | Status: DC | PRN
  Administered 2021-06-21: 4 mg via ORAL
  Filled 2021-06-17 (×2): qty 1

## 2021-06-17 MED ORDER — SODIUM CHLORIDE 0.9% FLUSH
3.0000 mL | Freq: Two times a day (BID) | INTRAVENOUS | Status: DC
  Administered 2021-06-18 – 2021-06-21 (×6): 3 mL via INTRAVENOUS

## 2021-06-17 MED ORDER — LORAZEPAM 2 MG/ML IJ SOLN: 1.0000 mg | INTRAMUSCULAR | Status: AC | PRN

## 2021-06-17 MED ORDER — ALBUTEROL SULFATE HFA 108 (90 BASE) MCG/ACT IN AERS: 2.0000 | INHALATION_SPRAY | RESPIRATORY_TRACT | Status: DC | PRN

## 2021-06-17 MED ORDER — IOHEXOL 350 MG/ML SOLN
100.0000 mL | Freq: Once | INTRAVENOUS | Status: AC | PRN
  Administered 2021-06-17: 100 mL via INTRAVENOUS

## 2021-06-17 MED ORDER — POTASSIUM CHLORIDE CRYS ER 20 MEQ PO TBCR
40.0000 meq | EXTENDED_RELEASE_TABLET | Freq: Once | ORAL | Status: AC
  Administered 2021-06-17: 40 meq via ORAL
  Filled 2021-06-17: qty 2

## 2021-06-17 MED ORDER — THIAMINE HCL 100 MG/ML IJ SOLN: 100.0000 mg | Freq: Every day | INTRAMUSCULAR | Status: DC

## 2021-06-17 MED ORDER — SODIUM CHLORIDE 0.9 % IV SOLN
100.0000 mg | Freq: Every day | INTRAVENOUS | Status: DC
  Filled 2021-06-17: qty 20

## 2021-06-17 MED ORDER — FUROSEMIDE 10 MG/ML IJ SOLN
80.0000 mg | Freq: Once | INTRAMUSCULAR | Status: AC
  Administered 2021-06-17: 80 mg via INTRAVENOUS
  Filled 2021-06-17 (×2): qty 8

## 2021-06-17 MED ORDER — ADULT MULTIVITAMIN W/MINERALS CH
1.0000 | ORAL_TABLET | Freq: Every day | ORAL | Status: DC
  Administered 2021-06-18 – 2021-06-21 (×4): 1 via ORAL
  Filled 2021-06-17 (×4): qty 1

## 2021-06-17 NOTE — H&P (Signed)
Mario Beck NLG:[id number] DOB: September 10, 1981 DOA: 06/17/2021     PCP: Oneita Hurt, No   Outpatient Specialists:  NONE    Patient arrived to ER on 06/17/21 at 1348 Referred by Attending Gilles Chiquito, MD   Patient coming from: home Lives  With SO    Chief Complaint:   Chief Complaint  Patient presents with   Shortness of Breath   Abd Distention    HPI: Mario Beck is a 40 y.o. male with medical history significant of    recurrent alcoholic pancreatitis, chronic alcohol abuse, chronic tobacco use, hypertension, reflux   Presented with  abdominal distention for the past 3 days Hard to take deep breaths Also noted leg edema He has been incresing his does of home lasix up to 80 mg a day with little help Has been having more SOB and cough Positive for orthopnea Drinks whiskey and a few beers per night has not been drinking for the past 24h  Recuent admit for pancreatitis Patient states multiple family members in his family has been sick. Reports he has had history of asthma as a child and has been having some worsening shortness of breath.  He continues to smoke and drink Reports normal bowel movement  Has   been vaccinated against COVID Pfizer x1   Initial COVID TEST   POSITIVE,   Lab Results  Component Value Date   SARSCOV2NAA POSITIVE (A) 06/17/2021   SARSCOV2NAA NEGATIVE 05/28/2021   SARSCOV2NAA NEGATIVE 05/04/2021   SARSCOV2NAA NEGATIVE 02/19/2021     Regarding pertinent Chronic problems:       HTN on metoprolol   chronic CHF diastolic/systolic/ combined - last echo december 2022 EF 30-35% and grade 2 diastolic CHF    Chronic anemia - baseline hg Hemoglobin & Hematocrit  Recent Labs    05/28/21 1024 05/29/21 0529 06/17/21 1406  HGB 12.9* 11.8* 12.3*     While in ER: K 3.1 given 10 mEq and 40 mEq PO  BNP up to 3000 Covid positive ER started on ramdesivir Troponin 43 Cxr ? Efusion CT A no PE trace bilateral pleral effusions Cardiomegaly and  emphysema Mg 1.7   ED Triage Vitals  Enc Vitals Group     BP 06/17/21 1401 (!) 136/103     Pulse Rate 06/17/21 1401 (!) 103     Resp 06/17/21 1401 18     Temp 06/17/21 1401 98.5 F (36.9 C)     Temp Source 06/17/21 1401 Oral     SpO2 06/17/21 1401 99 %     Weight 06/17/21 1351 182 lb (82.6 kg)     Height 06/17/21 1351 6\' 3"  (1.905 m)     Head Circumference --      Peak Flow --      Pain Score --      Pain Loc --      Pain Edu? --      Excl. in GC? --   TMAX(24)@     _________________________________________ Significant initial  Findings: Abnormal Labs Reviewed  RESP PANEL BY RT-PCR (FLU A&B, COVID) ARPGX2 - Abnormal; Notable for the following components:      Result Value   SARS Coronavirus 2 by RT PCR POSITIVE (*)    All other components within normal limits  COMPREHENSIVE METABOLIC PANEL - Abnormal; Notable for the following components:   Potassium 3.1 (*)    Glucose, Bld 120 (*)    BUN 21 (*)    Calcium 8.7 (*)  AST 50 (*)    Alkaline Phosphatase 172 (*)    All other components within normal limits  CBC - Abnormal; Notable for the following components:   RBC 4.09 (*)    Hemoglobin 12.3 (*)    HCT 34.8 (*)    nRBC 0.3 (*)    All other components within normal limits  BRAIN NATRIURETIC PEPTIDE - Abnormal; Notable for the following components:   B Natriuretic Peptide 3,688.9 (*)    All other components within normal limits  D-DIMER, QUANTITATIVE - Abnormal; Notable for the following components:   D-Dimer, Quant 1.16 (*)    All other components within normal limits  TROPONIN I (HIGH SENSITIVITY) - Abnormal; Notable for the following components:   Troponin I (High Sensitivity) 42 (*)    All other components within normal limits  TROPONIN I (HIGH SENSITIVITY) - Abnormal; Notable for the following components:   Troponin I (High Sensitivity) 43 (*)    All other components within normal limits   ____________________________________________ Ordered    CXR -   pleural effusion   CTA chest -  mild plural effusions no PE, no evidence of infiltrate   _________________________ Troponin 42- 43 below baseline ECG: Ordered Personally reviewed by me showing: HR : 109 Rhythm:  NSR,   no evidence of ischemic changes QTC 452   The recent clinical data is shown below. Vitals:   06/17/21 1930 06/17/21 2030 06/17/21 2108 06/17/21 2205  BP: (!) 126/99 (!) 136/103 (!) 129/98 (!) 122/97  Pulse: (!) 104 (!) 101 (!) 112 (!) 106  Resp: (!) 24 18 (!) 23 19  Temp:      TempSrc:      SpO2: 97% 97% 99% 100%  Weight:      Height:           WBC     Component Value Date/Time   WBC 6.7 06/17/2021 1406   LYMPHSABS 1.7 05/29/2021 0529   MONOABS 0.4 05/29/2021 0529   EOSABS 0.1 05/29/2021 0529   BASOSABS 0.0 05/29/2021 0529     Procalcitonin  Ordered   UA  ordered     Results for orders placed or performed during the hospital encounter of 06/17/21  Resp Panel by RT-PCR (Flu A&B, Covid) Nasopharyngeal Swab     Status: Abnormal   Collection Time: 06/17/21  5:18 PM   Specimen: Nasopharyngeal Swab; Nasopharyngeal(NP) swabs in vial transport medium  Result Value Ref Range Status   SARS Coronavirus 2 by RT PCR POSITIVE (A) NEGATIVE Final         Influenza A by PCR NEGATIVE NEGATIVE Final   Influenza B by PCR NEGATIVE NEGATIVE Final          _______________________________________________ Hospitalist was called for admission for CHF exacerbation and covid infection  The following Work up has been ordered so far:  Orders Placed This Encounter  Procedures   Resp Panel by RT-PCR (Flu A&B, Covid) Nasopharyngeal Swab   DG Chest 2 View   CT Angio Chest PE W and/or Wo Contrast   Lipase, blood   Comprehensive metabolic panel   CBC   Urinalysis, Complete w Microscopic   Magnesium   Brain natriuretic peptide   Ethanol   D-dimer, quantitative   Diet NPO time specified   Cardiac monitoring   Check Pulse Oximetry while ambulating   Consult to  hospitalist   Airborne and Contact precautions   ED EKG      Following Medications were ordered in ER: Medications  potassium  chloride 10 mEq in 100 mL IVPB (10 mEq Intravenous New Bag/Given 06/17/21 2057)  remdesivir 200 mg in sodium chloride 0.9% 250 mL IVPB (has no administration in time range)    Followed by  remdesivir 100 mg in sodium chloride 0.9 % 100 mL IVPB (has no administration in time range)  potassium chloride SA (KLOR-CON) CR tablet 40 mEq (40 mEq Oral Given 06/17/21 1723)  furosemide (LASIX) injection 80 mg (80 mg Intravenous Given 06/17/21 1834)  iohexol (OMNIPAQUE) 350 MG/ML injection 100 mL (100 mLs Intravenous Contrast Given 06/17/21 2128)        Consult Orders  (From admission, onward)           Start     Ordered   06/17/21 2148  Consult to hospitalist  Once       Provider:  (Not yet assigned)  Question Answer Comment  Place call to: [phone]   Reason for Consult Admit      06/17/21 2148              OTHER Significant initial  Findings:  labs showing:   Recent Labs  Lab 06/17/21 1406  NA 136  K 3.1*  CO2 24  GLUCOSE 120*  BUN 21*  CREATININE 1.17  CALCIUM 8.7*  MG 1.7    Cr   stable,    Lab Results  Component Value Date   CREATININE 1.17 06/17/2021   CREATININE 1.06 05/29/2021   CREATININE 1.22 05/28/2021    Recent Labs  Lab 06/17/21 1406  AST 50*  ALT 35  ALKPHOS 172*  BILITOT 1.0  PROT 6.8  ALBUMIN 3.5   Lab Results  Component Value Date   CALCIUM 8.7 (L) 06/17/2021   PHOS 3.5 02/20/2021    Plt: Lab Results  Component Value Date   PLT 201 06/17/2021    COVID-19 Labs  Recent Labs    06/17/21 1833  DDIMER 1.16*    Lab Results  Component Value Date   SARSCOV2NAA POSITIVE (A) 06/17/2021   SARSCOV2NAA NEGATIVE 05/28/2021   SARSCOV2NAA NEGATIVE 05/04/2021   SARSCOV2NAA NEGATIVE 02/19/2021        Recent Labs  Lab 06/17/21 1406  WBC 6.7  HGB 12.3*  HCT 34.8*  MCV 85.1  PLT 201    HG/HCT  stable,      Component Value Date/Time   HGB 12.3 (L) 06/17/2021 1406   HCT 34.8 (L) 06/17/2021 1406   MCV 85.1 06/17/2021 1406      Recent Labs  Lab 06/17/21 1406  LIPASE 18    BNP (last 3 results) Recent Labs    12/11/20 0315 06/17/21 1406  BNP 1,843.9* 3,688.9*    DM  labs:  HbA1C: Recent Labs    09/17/20 0514  HGBA1C 6.0*    CBG (last 3)  No results for input(s): GLUCAP in the last 72 hours.        Cultures:    Component Value Date/Time   SDES  05/27/2021 2244    URINE, CLEAN CATCH Performed at [facility], [address] [place], [place] [address]    [facility]  05/27/2021 2244    NONE Performed at [facility], [address] [place] [place], [place] [address]    CULT  05/27/2021 2244    NO GROWTH Performed at [facility], [address] [place], [place] [address]    REPTSTATUS 05/29/2021 FINAL 05/27/2021 2244     Radiological Exams on Admission: DG Chest 2 View  Result Date: 06/17/2021  CLINICAL DATA:  Shortness of breath, abdominal distension for 3 days, difficulty taking a deep breath, hypertension, asthma EXAM: CHEST - 2 VIEW COMPARISON:  05/04/2021 FINDINGS: Enlargement of cardiac silhouette with pulmonary vascular congestion. Mediastinal contours normal. Minimal RIGHT basilar atelectasis. Atelectasis versus consolidation at retrocardiac LEFT lower lobe with associated small LEFT pleural effusion. Remaining lungs clear. No pneumothorax or acute osseous findings. IMPRESSION: Enlargement of cardiac silhouette with pulmonary vascular congestion. Minimal RIGHT basilar atelectasis. LEFT lower lobe atelectasis versus consolidation with small LEFT pleural effusion. Electronically Signed   By: Ulyses Southward M.D.   On: 06/17/2021 18:21   CT Angio Chest PE W and/or Wo Contrast  Result Date: 06/17/2021 EXAM: CT ANGIOGRAPHY CHEST WITH CONTRAST TECHNIQUE: Multidetector CT imaging of the chest was performed using the standard protocol during bolus  administration of intravenous contrast. Multiplanar CT image reconstructions and MIPs were obtained to evaluate the vascular anatomy. CONTRAST:  OMNIPAQUE IOHEXOL 350 MG/ML SOLN COMPARISON:  None. FINDINGS: Cardiovascular: Fairly satisfactory opacification of the pulmonary arteries to the segmental level. Limited evaluation of the subsegmental level due to timing of contrast. No evidence of pulmonary embolism. The main pulmonary artery is normal in caliber. Enlarged left ventricle. No significant pericardial effusion. The thoracic aorta is normal in caliber. No atherosclerotic plaque of the thoracic aorta. No coronary artery calcifications. Mediastinum/Nodes: No enlarged mediastinal, hilar, or axillary lymph nodes. Thyroid gland, trachea, and esophagus demonstrate no significant findings. Lungs/Pleura: Paraseptal emphysematous changes. No focal consolidation. No pulmonary nodule. No pulmonary mass. Trace bilateral pleural effusions, left greater than right. No pneumothorax. Upper Abdomen: No acute abnormality. Musculoskeletal: No chest wall abnormality. No acute or significant osseous findings. Review of the MIP images confirms the above findings. IMPRESSION: 1. No central or segmental pulmonary embolus. Limited evaluation of the subsegmental level. 2. Trace bilateral pleural effusions. 3. Cardiomegaly. 4.  Emphysema (ICD10-J43.9). Electronically Signed   By: Tish Frederickson M.D.   On: 06/17/2021 22:01   _______________________________________________________________________________________________________ Latest  Blood pressure (!) 122/97, pulse (!) 106, temperature 98.5 F (36.9 C), temperature source Oral, resp. rate 19, height 6\' 3"  (1.905 m), weight 82.6 kg, SpO2 100 %.   Review of Systems:    Pertinent positives include:  chills, fatigue, shortness of breath at rest.dyspnea on exertion,non-productive cough, abdominal pain Constitutional:  No weight loss, night sweats, Fevers,  weight loss   HEENT:  No headaches, Difficulty swallowing,Tooth/dental problems,Sore throat,  No sneezing, itching, ear ache, nasal congestion, post nasal drip,  Cardio-vascular:  No chest pain, Orthopnea, PND, anasarca, dizziness, palpitations.no Bilateral lower extremity swelling  GI:  No heartburn, indigestion, , nausea, vomiting, diarrhea, change in bowel habits, loss of appetite, melena, blood in stool, hematemesis Resp:    No excess mucus, no productive cough, No No coughing up of blood.No change in color of mucus.No wheezing. Skin:  no rash or lesions. No jaundice GU:  no dysuria, change in color of urine, no urgency or frequency. No straining to urinate.  No flank pain.  Musculoskeletal:  No joint pain or no joint swelling. No decreased range of motion. No back pain.  Psych:  No change in mood or affect. No depression or anxiety. No memory loss.  Neuro: no localizing neurological complaints, no tingling, no weakness, no double vision, no gait abnormality, no slurred speech, no confusion  All systems reviewed and apart from HOPI all are negative _______________________________________________________________________________________________ Past Medical History:   Past Medical History:  Diagnosis Date   Alcohol abuse    Asthma  Depression    Hypertension       Past Surgical History:  Procedure Laterality Date   FRACTURE SURGERY     Left leg as a kid    Social History:  Ambulatory   independently       reports that he has been smoking cigarettes. He has been smoking an average of 1 pack per day. His smokeless tobacco use includes snuff. He reports current alcohol use of about 7.0 standard drinks per week. He reports that he does not use drugs.    Family History:   Family History  Problem Relation Age of Onset   Colon cancer Neg Hx    Esophageal cancer Neg Hx     ______________________________________________________________________________________________ Allergies: Allergies  Allergen Reactions   Lisinopril Swelling    Angioedema    Shellfish Allergy Swelling     Prior to Admission medications   Medication Sig Start Date End Date Taking? Authorizing Provider  furosemide (LASIX) 40 MG tablet Take 1 tablet (40 mg total) by mouth daily. 12/11/20 12/11/21  Gilles ChiquitoSmith, Zachary P, MD  LORazepam (ATIVAN) 1 MG tablet Take 1 tablet (1 mg total) by mouth every 8 (eight) hours as needed for anxiety. 05/05/21   Irean HongSung, Jade J, MD  metoprolol succinate (TOPROL-XL) 50 MG 24 hr tablet Take 50 mg by mouth daily. 05/12/21   [provider]  Multiple Vitamin (MULTIVITAMIN WITH MINERALS) TABS tablet Take 1 tablet by mouth daily. 05/30/21   Arnetha CourserAmin, Sumayya, MD  ondansetron (ZOFRAN) 4 MG tablet Take 4 mg by mouth every 8 (eight) hours as needed. 05/05/21   [provider]  oxyCODONE-acetaminophen (PERCOCET/ROXICET) 5-325 MG tablet Take 1 tablet by mouth every 4 (four) hours as needed for severe pain. 05/05/21   Irean HongSung, Jade J, MD  thiamine 100 MG tablet Take 1 tablet (100 mg total) by mouth daily. 05/30/21   Arnetha CourserAmin, Sumayya, MD    ___________________________________________________________________________________________________ Physical Exam: Vitals with BMI 06/17/2021 06/17/2021 06/17/2021  Height - - -  Weight - - -  BMI - - -  Systolic 122 129 [id number]  Diastolic 97 98 103  Pulse 106 [id number] 101     1. General:  in No  Acute distress     Chronically ill   2. Psychological: Alert and   Oriented 3. Head/ENT: moist  Mucous Membranes                          Head Non traumatic, neck supple                           Poor Dentition 4. SKIN: decreased Skin turgor,  Skin clean Dry and intact no rash 5. Heart: Regular rate and rhythm no  Murmur, no Rub or gallop 6. Lungs:   occasional wheezes some  crackles   7. Abdomen: Soft,  non-tender,  distended    bowel sounds  present 8. Lower extremities: no clubbing, cyanosis, no  edema 9. Neurologically Grossly intact, moving all 4 extremities equally  10. MSK: Normal range of motion    Chart has been reviewed  ______________________________________________________________________________________________  Assessment/Plan 40 y.o. male with medical history significant of    recurrent alcoholic pancreatitis, chronic alcohol abuse, chronic tobacco use, hypertension, reflux    Admitted for CHF exacerbation and COVID infection   Present on Admission:  Acute on chronic systolic CHF (congestive heart failure) (HCC) - - admit on telemetry,  cycle cardiac enzymes,  Troponin 43-40  obtain serial ECG  to evaluate for ischemia as a cause of heart failure  monitor daily weight:  Filed Weights   06/17/21 1351  Weight: 82.6 kg   Last BNP BNP (last 3 results) Recent Labs    12/11/20 0315 06/17/21 1406  BNP 1,843.9* 3,688.9*     diurese with IV lasix and monitor orthostatics and creatinine to avoid over diuresis.  Order echogram to evaluate EF and valves  ACE/ARBi allergic   cardiology consult in AM    Tobacco abuse -  - Spoke about importance of quitting spent 5 minutes discussing options for treatment, prior attempts at quitting, and dangers of smoking  -At this point patient is   NOT  interested in quitting  - order nicotine patch   - nursing tobacco cessation protocol   HTN (hypertension), malignant restart beta-blocker if able to tolerate   GERD (gastroesophageal reflux disease) continue PPI    Elevated troponin mild below baseline No evidence of chest pain    Hypokalemia - - will replace and repeat in AM,  check magnesium level and replace as needed   COVID-19 virus infection -patient symptomatic although unclear if CHF is more contributed to shortness of breath versus COVID infection.  No evidence of infiltrates or hypoxia.  Started on remdesivir emergency department will continue no indication  for steroids at this time CTA negative for PE.  Ordered inflammatory markers   Hypomagnesemia will replace  Alcohol abuse monitor for any sign of withdrawal order CIWA protocol  Other plan as per orders.  DVT prophylaxis:   Lovenox       Code Status:    Code Status: Prior FULL CODE  as per patient   I had personally discussed CODE STATUS with patient      Family Communication:   Family not at  Bedside Girlfriend bedside Disposition Plan:      To home once workup is complete and patient is stable   Following barriers for discharge:                            Electrolytes corrected                                                             Pain controlled with PO medications                               Afebrile, white count improving able to transition to PO antibiotics                             Will need to be able to tolerate PO                            Will likely need home health, home O2, set up                           Will need consultants to evaluate patient prior to discharge  Transition of care consulted                   Nutrition    consulted                                      Consults called: Will need cardiology consult in a.m.  Admission status:  ED Disposition     ED Disposition  Admit   Condition  --   Comment  Hospital Area: [facility] [[id number]]  Level of Care: Med-Surg [[id number]]  Covid Evaluation: Confirmed COVID Positive  Diagnosis: Acute on chronic systolic CHF (congestive heart failure) [facility]) [[id number]]  Admitting Physician: Therisa Doyne [[id number]]  Attending Physician: Therisa Doyne [[id number]]        obs     Level of care    tele  For 12H    Lab Results  Component Value Date   SARSCOV2NAA POSITIVE (A) 06/17/2021     Precautions: admitted as  covid positive Airborne and Contact precautions     PPE: Used by the provider:   N95  eye Goggles,  Gloves     Lilymae Swiech 06/18/2021, 1:31 AM    [facility]     after 2 AM please page floor coverage PA If 7AM-7PM, please contact the day team taking care of the patient using [email or web address]   Patient was evaluated in the context of the global COVID-19 pandemic, which necessitated consideration that the patient might be at risk for infection with the SARS-CoV-2 virus that causes COVID-19. Institutional protocols and algorithms that pertain to the evaluation of patients at risk for COVID-19 are in a state of rapid change based on information released by regulatory bodies including the CDC and federal and state organizations. These policies and algorithms were followed during the patient's care.

## 2021-06-17 NOTE — ED Notes (Signed)
Ambulatory pulse ox 97% 

## 2021-06-17 NOTE — ED Triage Notes (Signed)
Pt c/o abd distention for the past 3 days, states it is so swollen it is causing him to having difficulty taking a deep breath, pt is in NAD at present.

## 2021-06-17 NOTE — Progress Notes (Signed)
Remdesivir - Pharmacy Brief Note   O:  ALT: 35  CXR:  SpO2: 95 % on RA    A/P:  Remdesivir 200 mg IVPB once followed by 100 mg IVPB daily x 4 days.   Aseret Hoffman D 06/17/2021 11:01 PM

## 2021-06-17 NOTE — ED Notes (Signed)
IV team at bedside. Pt educated on poc including need for IV that can be used for CT scan, verbalizes understanding.

## 2021-06-17 NOTE — ED Notes (Signed)
Per Dr Michiel Sites, ok for pt to eat. Pt aware. Discussed low sodium, heart healthy diet with pt and his family at bedside who verbalize understanding.

## 2021-06-17 NOTE — ED Provider Notes (Signed)
[facility] Emergency Department Provider Note  ____________________________________________   Event Date/Time   First MD Initiated Contact with Patient 06/17/21 1627     (approximate)  I have reviewed the triage vital signs and the nursing notes.   HISTORY  Chief Complaint Shortness of Breath and Abd Distention   HPI Mario Beck is a 40 y.o. male with a history of recurrent alcoholic pancreatitis, chronic alcohol abuse, chronic tobacco use, hypertension, reflux and CHF currently on 80 mg of Lasix per day patient stating he recently increased his 40 mg tablets to twice a day other is not sure when he increase the dose who presents for assessment of their 4 days of worsening shortness of breath associate with cough and some white sputum production as well as some swelling in his abdomen and orthopnea.  Patient states he feels very short of breath however he lies flat.  He has not talked to his PCP or cardiologist about this.  He has no new chest pain, fevers, headache, earache, sore throat, nausea, vomiting, diarrhea, dysuria, rash abdominal pain or other acute sick symptoms.  States has been at least 3 or 4 days since he had any alcohol.  He denies any other acute concerns at this time.         Past Medical History:  Diagnosis Date   Alcohol abuse    Asthma    Depression    Hypertension     Patient Active Problem List   Diagnosis Date Noted   Acute on chronic systolic CHF (congestive heart failure) (HCC) 06/17/2021   Epigastric pain    Alcohol withdrawal syndrome without complication (HCC)    Recurrent pancreatitis 02/19/2021   Chronic systolic CHF (congestive heart failure) (HCC) 09/17/2020   Alcoholic cardiomyopathy (HCC) 91/91/6606   Alcoholic pancreatitis 09/16/2020   Tobacco abuse 09/16/2020   Abnormal EKG 09/16/2020   GERD (gastroesophageal reflux disease) 09/16/2020   Depression 09/16/2020   Dark stools 09/16/2020   Elevated troponin  09/16/2020   Transaminitis 03/31/2020   Nausea and vomiting 02/21/2020   Intractable nausea and vomiting 02/21/2020   Tobacco abuse counseling    Acute alcoholic pancreatitis 02/18/2020   HTN (hypertension), malignant 02/18/2020   Alcohol use disorder, moderate, dependence (HCC) 02/18/2020   Benign essential HTN 12/01/2015    Past Surgical History:  Procedure Laterality Date   FRACTURE SURGERY     Left leg as a kid    Prior to Admission medications   Medication Sig Start Date End Date Taking? Authorizing Provider  furosemide (LASIX) 40 MG tablet Take 1 tablet (40 mg total) by mouth daily. 12/11/20 12/11/21  Gilles Chiquito, MD  LORazepam (ATIVAN) 1 MG tablet Take 1 tablet (1 mg total) by mouth every 8 (eight) hours as needed for anxiety. 05/05/21   Irean Hong, MD  metoprolol succinate (TOPROL-XL) 50 MG 24 hr tablet Take 50 mg by mouth daily. 05/12/21   [provider]  Multiple Vitamin (MULTIVITAMIN WITH MINERALS) TABS tablet Take 1 tablet by mouth daily. 05/30/21   Arnetha Courser, MD  ondansetron (ZOFRAN) 4 MG tablet Take 4 mg by mouth every 8 (eight) hours as needed. 05/05/21   [provider]  oxyCODONE-acetaminophen (PERCOCET/ROXICET) 5-325 MG tablet Take 1 tablet by mouth every 4 (four) hours as needed for severe pain. 05/05/21   Irean Hong, MD  thiamine 100 MG tablet Take 1 tablet (100 mg total) by mouth daily. 05/30/21   Arnetha Courser, MD  Allergies Lisinopril and Shellfish allergy  Family History  Problem Relation Age of Onset   Colon cancer Neg Hx    Esophageal cancer Neg Hx     Social History Social History   Tobacco Use   Smoking status: Every Day    Packs/day: 1.00    Types: Cigarettes   Smokeless tobacco: Current    Types: Snuff   Tobacco comments:    Does pouches as welll   Vaping Use   Vaping Use: Never used  Substance Use Topics   Alcohol use: Yes    Alcohol/week: 7.0 standard drinks    Types: 7 Cans of beer per week   Drug use: No     Review of Systems  Review of Systems  Constitutional:  Negative for chills and fever.  HENT:  Negative for sore throat.   Eyes:  Negative for pain.  Respiratory:  Positive for cough and shortness of breath. Negative for stridor.   Cardiovascular:  Positive for orthopnea. Negative for chest pain.  Gastrointestinal:  Negative for vomiting.  Genitourinary:  Negative for dysuria.  Musculoskeletal:  Negative for myalgias.  Skin:  Negative for rash.  Neurological:  Negative for seizures, loss of consciousness and headaches.  Psychiatric/Behavioral:  Negative for suicidal ideas.   All other systems reviewed and are negative.    ____________________________________________   PHYSICAL EXAM:  VITAL SIGNS: ED Triage Vitals  Enc Vitals Group     BP 06/17/21 1401 (!) 136/103     Pulse Rate 06/17/21 1401 (!) 103     Resp 06/17/21 1401 18     Temp 06/17/21 1401 98.5 F (36.9 C)     Temp Source 06/17/21 1401 Oral     SpO2 06/17/21 1401 99 %     Weight 06/17/21 1351 182 lb (82.6 kg)     Height 06/17/21 1351 6\' 3"  (1.905 m)     Head Circumference --      Peak Flow --      Pain Score --      Pain Loc --      Pain Edu? --      Excl. in GC? --    Vitals:   06/17/21 2108 06/17/21 2205  BP: (!) 129/98 (!) 122/97  Pulse: (!) 112 (!) 106  Resp: (!) 23 19  Temp:    SpO2: 99% 100%   Physical Exam Vitals and nursing note reviewed.  Constitutional:      Appearance: He is well-developed.  HENT:     Head: Normocephalic and atraumatic.     Right Ear: External ear normal.     Left Ear: External ear normal.     Nose: Nose normal.  Eyes:     Conjunctiva/sclera: Conjunctivae normal.  Cardiovascular:     Rate and Rhythm: Regular rhythm. Tachycardia present.     Heart sounds: No murmur heard. Pulmonary:     Effort: Pulmonary effort is normal. Tachypnea present. No respiratory distress.     Breath sounds: Decreased breath sounds present.  Abdominal:     General: There is distension.      Palpations: Abdomen is soft.     Tenderness: There is no abdominal tenderness. There is no right CVA tenderness or left CVA tenderness.  Musculoskeletal:     Cervical back: Neck supple.     Right lower leg: Edema (Minimal 1+ bilaterally.) present.     Left lower leg: Edema present.  Skin:    General: Skin is warm and dry.  Capillary Refill: Capillary refill takes less than 2 seconds.  Neurological:     Mental Status: He is alert and oriented to person, place, and time.  Psychiatric:        Mood and Affect: Mood normal.    Very mildly distended but nontender throughout the abdomen. ____________________________________________   LABS (all labs ordered are listed, but only abnormal results are displayed)  Labs Reviewed  RESP PANEL BY RT-PCR (FLU A&B, COVID) ARPGX2 - Abnormal; Notable for the following components:      Result Value   SARS Coronavirus 2 by RT PCR POSITIVE (*)    All other components within normal limits  COMPREHENSIVE METABOLIC PANEL - Abnormal; Notable for the following components:   Potassium 3.1 (*)    Glucose, Bld 120 (*)    BUN 21 (*)    Calcium 8.7 (*)    AST 50 (*)    Alkaline Phosphatase 172 (*)    All other components within normal limits  CBC - Abnormal; Notable for the following components:   RBC 4.09 (*)    Hemoglobin 12.3 (*)    HCT 34.8 (*)    nRBC 0.3 (*)    All other components within normal limits  BRAIN NATRIURETIC PEPTIDE - Abnormal; Notable for the following components:   B Natriuretic Peptide 3,688.9 (*)    All other components within normal limits  D-DIMER, QUANTITATIVE - Abnormal; Notable for the following components:   D-Dimer, Quant 1.16 (*)    All other components within normal limits  TROPONIN I (HIGH SENSITIVITY) - Abnormal; Notable for the following components:   Troponin I (High Sensitivity) 42 (*)    All other components within normal limits  TROPONIN I (HIGH SENSITIVITY) - Abnormal; Notable for the following  components:   Troponin I (High Sensitivity) 43 (*)    All other components within normal limits  LIPASE, BLOOD  MAGNESIUM  ETHANOL  URINALYSIS, COMPLETE (UACMP) WITH MICROSCOPIC   ____________________________________________  EKG  Sinus tachycardia with ventricular rate of 109,Unremarkable intervals without clear evidence of acute ischemia or significant arrhythmia there is an isolated nonspecific changes in V5 and V6. ____________________________________________  RADIOLOGY  ED MD interpretation: Chest x-ray with some cardiomegaly and pulmonary vascular congestion.  No focal consolidation, pneumothorax, or other clear acute thoracic process.  CTA shows no evidence of PE.  There is trace bilateral effusions cardiomegaly some edema.  There is also evidence of emphysema.  Official radiology report(s): DG Chest 2 View  Result Date: 06/17/2021 CLINICAL DATA:  Shortness of breath, abdominal distension for 3 days, difficulty taking a deep breath, hypertension, asthma EXAM: CHEST - 2 VIEW COMPARISON:  05/04/2021 FINDINGS: Enlargement of cardiac silhouette with pulmonary vascular congestion. Mediastinal contours normal. Minimal RIGHT basilar atelectasis. Atelectasis versus consolidation at retrocardiac LEFT lower lobe with associated small LEFT pleural effusion. Remaining lungs clear. No pneumothorax or acute osseous findings. IMPRESSION: Enlargement of cardiac silhouette with pulmonary vascular congestion. Minimal RIGHT basilar atelectasis. LEFT lower lobe atelectasis versus consolidation with small LEFT pleural effusion. Electronically Signed   By: Ulyses SouthwardMark  Boles M.D.   On: 06/17/2021 18:21   CT Angio Chest PE W and/or Wo Contrast  Result Date: 06/17/2021 EXAM: CT ANGIOGRAPHY CHEST WITH CONTRAST TECHNIQUE: Multidetector CT imaging of the chest was performed using the standard protocol during bolus administration of intravenous contrast. Multiplanar CT image reconstructions and MIPs were obtained to  evaluate the vascular anatomy. CONTRAST:  100mL OMNIPAQUE IOHEXOL 350 MG/ML SOLN COMPARISON:  None. FINDINGS: Cardiovascular: Fairly satisfactory  opacification of the pulmonary arteries to the segmental level. Limited evaluation of the subsegmental level due to timing of contrast. No evidence of pulmonary embolism. The main pulmonary artery is normal in caliber. Enlarged left ventricle. No significant pericardial effusion. The thoracic aorta is normal in caliber. No atherosclerotic plaque of the thoracic aorta. No coronary artery calcifications. Mediastinum/Nodes: No enlarged mediastinal, hilar, or axillary lymph nodes. Thyroid gland, trachea, and esophagus demonstrate no significant findings. Lungs/Pleura: Paraseptal emphysematous changes. No focal consolidation. No pulmonary nodule. No pulmonary mass. Trace bilateral pleural effusions, left greater than right. No pneumothorax. Upper Abdomen: No acute abnormality. Musculoskeletal: No chest wall abnormality. No acute or significant osseous findings. Review of the MIP images confirms the above findings. IMPRESSION: 1. No central or segmental pulmonary embolus. Limited evaluation of the subsegmental level. 2. Trace bilateral pleural effusions. 3. Cardiomegaly. 4.  Emphysema (ICD10-J43.9). Electronically Signed   By: Tish Frederickson M.D.   On: 06/17/2021 22:01    ____________________________________________   PROCEDURES  Procedure(s) performed (including Critical Care):  .1-3 Lead EKG Interpretation  Date/Time: 06/17/2021 10:16 PM Performed by: Gilles Chiquito, MD Authorized by: Gilles Chiquito, MD     Interpretation: normal     ECG rate assessment: normal     Rhythm: sinus rhythm     Ectopy: none     Conduction: normal     ____________________________________________   INITIAL IMPRESSION / ASSESSMENT AND PLAN / ED COURSE      Patient presents above-stated reexam for assessment of a couple days of worsening shortness of breath cough and  feeling very short of breath when he is laying flat i.e. orthopnea.  He has been taking increased dose of his Lasix up to 80 mg/day but does not feel this is helping.  He states he has not had any alcohol in several days and has no belly pain or GI symptoms.  He denies any chest pain.  On arrival he was afebrile and hemodynamically stable but slightly tachycardic.  Some mild lower extremity edema and his abdomen is slightly distended but nontender.  Lungs are clear on my exam bilaterally.  Differential includes pneumonia, CHF exacerbation, anemia, PE and bronchitis.  Chest x-ray with some cardiomegaly and pulmonary vascular congestion.  No focal consolidation, pneumothorax, or other clear acute thoracic process.  CTA obtained due to elevated D-dimer at 1.16 shows no evidence of PE.  There is trace bilateral effusions cardiomegaly some edema.  There is also evidence of emphysema.  CMP remarkable for K of 3.1 without any other significant electrolyte or metabolic derangements.  Lipase not consistent with acute pancreatitis.  CBC without acute anemia or leukocytosis.  Magnesium is 1.7.  BNP is elevated at 3688 pitting with history and exam and chest x-ray concerning for acute heart failure exacerbation.  Patient given potassium repletion and dose of 80 IV Lasix.  His COVID PCR is positive.  While he is now hypoxic respiratory failure do not think steroids are currently indicated but we will start him on remdesivir.  ECG with some nonspecific findings but troponins are flat at 42 and 43 down from when they were last checked a month ago and they are in the 60s I suspect some very mild demand ischemia and Evalose patient for ACS at this time.  Given tachycardia with patient stating he is feeling extremely short of breath with a couple steps and evidence of acute volume overload I will admit to medicine service for further evaluation and management.  ____________________________________________   FINAL CLINICAL IMPRESSION(S) / ED DIAGNOSES  Final diagnoses:  COVID  Congestive heart failure, unspecified HF chronicity, unspecified heart failure type (HCC)  Hypokalemia    Medications  potassium chloride 10 mEq in 100 mL IVPB (10 mEq Intravenous New Bag/Given 06/17/21 2057)  remdesivir 200 mg in sodium chloride 0.9% 250 mL IVPB (has no administration in time range)    Followed by  remdesivir 100 mg in sodium chloride 0.9 % 100 mL IVPB (has no administration in time range)  potassium chloride SA (KLOR-CON) CR tablet 40 mEq (40 mEq Oral Given 06/17/21 1723)  furosemide (LASIX) injection 80 mg (80 mg Intravenous Given 06/17/21 1834)  iohexol (OMNIPAQUE) 350 MG/ML injection 100 mL (100 mLs Intravenous Contrast Given 06/17/21 2128)     ED Discharge Orders     None        Note:  This document was prepared using Dragon voice recognition software and may include unintentional dictation errors.    Gilles Chiquito, MD 06/17/21 2217

## 2021-06-18 ENCOUNTER — Inpatient Hospital Stay (HOSPITAL_COMMUNITY)
Admit: 2021-06-18 | Discharge: 2021-06-18 | Disposition: A | Discharge status: Home / Self Care | Payer: Self-pay | Attending: Internal Medicine | Admitting: Internal Medicine

## 2021-06-18 DIAGNOSIS — U071 COVID-19: Secondary | ICD-10-CM | POA: Diagnosis present

## 2021-06-18 DIAGNOSIS — I509 Heart failure, unspecified: Secondary | ICD-10-CM

## 2021-06-18 DIAGNOSIS — I5021 Acute systolic (congestive) heart failure: Secondary | ICD-10-CM

## 2021-06-18 DIAGNOSIS — E876 Hypokalemia: Secondary | ICD-10-CM | POA: Diagnosis present

## 2021-06-18 LAB — CBC WITH DIFFERENTIAL/PLATELET
Abs Immature Granulocytes: 0.01 10*3/uL (ref 0.00–0.07)
Basophils Absolute: 0 10*3/uL (ref 0.0–0.1)
Basophils Relative: 1 %
Eosinophils Absolute: 0.1 10*3/uL (ref 0.0–0.5)
Eosinophils Relative: 1 %
HCT: 33.6 % — ABNORMAL LOW (ref 39.0–52.0)
Hemoglobin: 11.8 g/dL — ABNORMAL LOW (ref 13.0–17.0)
Immature Granulocytes: 0 %
Lymphocytes Relative: 39 %
Lymphs Abs: 2.4 10*3/uL (ref 0.7–4.0)
MCH: 30 pg (ref 26.0–34.0)
MCHC: 35.1 g/dL (ref 30.0–36.0)
MCV: 85.5 fL (ref 80.0–100.0)
Monocytes Absolute: 0.5 10*3/uL (ref 0.1–1.0)
Monocytes Relative: 8 %
Neutro Abs: 3.3 10*3/uL (ref 1.7–7.7)
Neutrophils Relative %: 51 %
Platelets: 212 10*3/uL (ref 150–400)
RBC: 3.93 MIL/uL — ABNORMAL LOW (ref 4.22–5.81)
RDW: 14.7 % (ref 11.5–15.5)
WBC: 6.3 10*3/uL (ref 4.0–10.5)
nRBC: 0.3 % — ABNORMAL HIGH (ref 0.0–0.2)

## 2021-06-18 LAB — ECHOCARDIOGRAM COMPLETE
AR max vel: 1.89 cm2
AV Area VTI: 1.69 cm2
AV Area mean vel: 1.65 cm2
AV Mean grad: 1.3 mmHg
AV Peak grad: 2.6 mmHg
Ao pk vel: 0.8 m/s
Area-P 1/2: 6.02 cm2
Calc EF: 15.4 %
Height: 75 in
S' Lateral: 6.4 cm
Single Plane A2C EF: 16.6 %
Single Plane A4C EF: 20.4 %
Weight: 2912 oz

## 2021-06-18 LAB — FIBRINOGEN: Fibrinogen: 372 mg/dL (ref 210–475)

## 2021-06-18 LAB — FERRITIN: Ferritin: 34 ng/mL (ref 24–336)

## 2021-06-18 LAB — COMPREHENSIVE METABOLIC PANEL
ALT: 29 U/L (ref 0–44)
AST: 36 U/L (ref 15–41)
Albumin: 3.3 g/dL — ABNORMAL LOW (ref 3.5–5.0)
Alkaline Phosphatase: 149 U/L — ABNORMAL HIGH (ref 38–126)
Anion gap: 10 (ref 5–15)
BUN: 21 mg/dL — ABNORMAL HIGH (ref 6–20)
CO2: 25 mmol/L (ref 22–32)
Calcium: 8.4 mg/dL — ABNORMAL LOW (ref 8.9–10.3)
Chloride: 102 mmol/L (ref 98–111)
Creatinine, Ser: 1.07 mg/dL (ref 0.61–1.24)
GFR, Estimated: >60 mL/min (ref >60)
Glucose, Bld: 118 mg/dL — ABNORMAL HIGH (ref 70–99)
Potassium: 3.4 mmol/L — ABNORMAL LOW (ref 3.5–5.1)
Sodium: 137 mmol/L (ref 135–145)
Total Bilirubin: 0.8 mg/dL (ref 0.3–1.2)
Total Protein: 6.2 g/dL — ABNORMAL LOW (ref 6.5–8.1)

## 2021-06-18 LAB — D-DIMER, QUANTITATIVE: D-Dimer, Quant: 1.1 ug/mL-FEU — ABNORMAL HIGH (ref 0.00–0.50)

## 2021-06-18 LAB — TSH: TSH: 1.402 u[IU]/mL (ref 0.350–4.500)

## 2021-06-18 LAB — PROTIME-INR
INR: 1.2 (ref 0.8–1.2)
Prothrombin Time: 15.1 seconds (ref 11.4–15.2)

## 2021-06-18 LAB — MAGNESIUM: Magnesium: 1.9 mg/dL (ref 1.7–2.4)

## 2021-06-18 LAB — PHOSPHORUS: Phosphorus: 3.7 mg/dL (ref 2.5–4.6)

## 2021-06-18 LAB — C-REACTIVE PROTEIN: CRP: 1.5 mg/dL — ABNORMAL HIGH (ref <1.0)

## 2021-06-18 LAB — PROCALCITONIN: Procalcitonin: <0.1 ng/mL

## 2021-06-18 LAB — LACTATE DEHYDROGENASE: LDH: 182 U/L (ref 98–192)

## 2021-06-18 MED ORDER — BENZONATATE 100 MG PO CAPS
200.0000 mg | ORAL_CAPSULE | Freq: Two times a day (BID) | ORAL | Status: DC | PRN
  Administered 2021-06-18 – 2021-06-20 (×4): 200 mg via ORAL
  Filled 2021-06-18 (×4): qty 2

## 2021-06-18 MED ORDER — BOOST / RESOURCE BREEZE PO LIQD CUSTOM
1.0000 | Freq: Three times a day (TID) | ORAL | Status: DC
  Administered 2021-06-19: 1 via ORAL

## 2021-06-18 MED ORDER — ACETAMINOPHEN 325 MG PO TABS
650.0000 mg | ORAL_TABLET | Freq: Four times a day (QID) | ORAL | Status: DC | PRN
  Administered 2021-06-18 – 2021-06-21 (×4): 650 mg via ORAL
  Filled 2021-06-18 (×4): qty 2

## 2021-06-18 MED ORDER — GUAIFENESIN-DM 100-10 MG/5ML PO SYRP
5.0000 mL | ORAL_SOLUTION | ORAL | Status: DC | PRN
  Administered 2021-06-18 – 2021-06-21 (×5): 5 mL via ORAL
  Filled 2021-06-18 (×5): qty 5

## 2021-06-18 MED ORDER — NIRMATRELVIR/RITONAVIR (PAXLOVID)TABLET
3.0000 | ORAL_TABLET | Freq: Two times a day (BID) | ORAL | Status: DC
  Administered 2021-06-18 – 2021-06-21 (×7): 3 via ORAL
  Filled 2021-06-18 (×3): qty 30
  Filled 2021-06-18: qty 180
  Filled 2021-06-18 (×2): qty 30

## 2021-06-18 NOTE — Progress Notes (Signed)
[facility] PROGRESS NOTE    Mario Beck  SAY:[id number] DOB: [birth year] DOA: 06/17/2021 PCP: Pcp, No      Brief Narrative:  Mario Beck is a 40 y.o. 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  Actively drinking, last drink 24 hours prior to admission.  Vaccinated 1 dose.  COVID-positive in the ER.  Chest imaging ruled out PE, shows no airspace disease, only effusions, BNP >3000.  K 3.1.        Assessment & Plan:  Acute on chronic systolic and diastolic CHF RR still 27 this morning.   Net neg 1.9 yesterday.  -Furosemide 40 mg IV twice a day  -K supplement -Strict I/Os, daily weights, telemetry  -Daily monitoring renal function - Obtain echocardiogram   Incidental COVID -Stop remdesivir - Start Paxlovid    Recurrent alcoholic pancreatitis Lipase normal, no epigastric pain  Alcohol use disorder -Continue thiamine and folate - Continue CIWA scoring with on-demand lorazepam   Asthma, mild intermittent No wheezing - Albuterol as needed  Hypokalemia Hypomagnesemia - Supplement potassium, mag             Disposition: Status is: Observation  The patient will require care spanning > 2 midnights and should be moved to inpatient because: he has severe ongoing symptoms and concomintant covid  Dispo: The patient is from: Home              Anticipated d/c is to: Home              Patient currently is not medically stable to d/c.   Difficult to place patient No       Level of care: Med-Surg       MDM: The below labs and imaging reports were reviewed and summarized above.  Medication management as above.     DVT prophylaxis: enoxaparin (LOVENOX) injection 40 mg Start: 06/17/21 2300  Code Status: full Family Communication:     Consultants:     Procedures:    Antimicrobials:     Culture data:             Subjective: Still has orthopnea, still short of breath at rest, coughing frequently with some whitish phlegm.  No fever, aches, chills.  No runny nose.  Objective: Vitals:   06/18/21 0600 06/18/21 0700 06/18/21 0758 06/18/21 0830  BP:   (!) 136/106 (!) 136/109  Pulse: 94 100 (!) 101 100  Resp: (!) 27 19 18  (!) 26  Temp:      TempSrc:      SpO2: 98% 97% 99% 96%  Weight:      Height:        Intake/Output Summary (Last 24 hours) at 06/18/2021 0857 Last data filed at 06/18/2021 08/18/2021 Gross per 24 hour  Intake 100 ml  Output 2705 ml  Net -2605 ml   Filed Weights   06/17/21 1351  Weight: 82.6 kg    Examination: General appearance:  adult male, alert and in no acute distress.   HEENT: Anicteric, conjunctiva pink, lids and lashes normal. No nasal deformity, discharge, epistaxis.  Lips moist, oropharynx moist, no oral lesions, dentition in good repair, hearing normal.   Skin: Warm and dry.  no jaundice.  No suspicious rashes or lesions. Cardiac: RRR, nl S1-S2, no murmurs appreciated.  Capillary refill is brisk.  JVP does not  appear severely elevated, no lower extremity edema Respiratory: Normal respiratory rate and rhythm.  CTAB without rales or wheezes. Abdomen: Abdomen soft.  no TTP. No ascites, distension, hepatosplenomegaly.   MSK: No deformities or effusions. Neuro: Awake and alert.  EOMI, moves all extremities. Speech fluent.    Psych: Sensorium intact and responding to questions, attention normal. Affect normal.  Judgment and insight appear normal.    Data Reviewed: I have personally reviewed following labs and imaging studies:  CBC: Recent Labs  Lab 06/17/21 1406 06/18/21 0153  WBC 6.7 6.3  NEUTROABS  --  3.3  HGB 12.3* 11.8*  HCT 34.8* 33.6*  MCV 85.1 85.5  PLT 201 212   Basic Metabolic Panel: Recent Labs  Lab 06/17/21 1406 06/18/21 0153  NA 136 137  K 3.1* 3.4*  CL 100 102   CO2 24 25  GLUCOSE 120* 118*  BUN 21* 21*  CREATININE 1.17 1.07  CALCIUM 8.7* 8.4*  MG 1.7 1.9  PHOS  --  3.7   GFR: Estimated Creatinine Clearance: 109.4 mL/min (by C-G formula based on SCr of 1.07 mg/dL). Liver Function Tests: Recent Labs  Lab 06/17/21 1406 06/18/21 0153  AST 50* 36  ALT 35 29  ALKPHOS 172* 149*  BILITOT 1.0 0.8  PROT 6.8 6.2*  ALBUMIN 3.5 3.3*   Recent Labs  Lab 06/17/21 1406  LIPASE 18   No results for input(s): AMMONIA in the last 168 hours. Coagulation Profile: Recent Labs  Lab 06/18/21 0153  INR 1.2   Cardiac Enzymes: No results for input(s): CKTOTAL, CKMB, CKMBINDEX, TROPONINI in the last 168 hours. BNP (last 3 results) No results for input(s): PROBNP in the last 8760 hours. HbA1C: No results for input(s): HGBA1C in the last 72 hours. CBG: No results for input(s): GLUCAP in the last 168 hours. Lipid Profile: No results for input(s): CHOL, HDL, LDLCALC, TRIG, CHOLHDL, LDLDIRECT in the last 72 hours. Thyroid Function Tests: Recent Labs    06/18/21 0153  TSH 1.402   Anemia Panel: Recent Labs    06/18/21 0153  FERRITIN 34   Urine analysis:    Component Value Date/Time   COLORURINE YELLOW (A) 05/27/2021 2244   APPEARANCEUR HAZY (A) 05/27/2021 2244   LABSPEC 1.018 05/27/2021 2244   PHURINE 5.0 05/27/2021 2244   GLUCOSEU 50 (A) 05/27/2021 2244   HGBUR SMALL (A) 05/27/2021 2244   BILIRUBINUR NEGATIVE 05/27/2021 2244   KETONESUR NEGATIVE 05/27/2021 2244   PROTEINUR 100 (A) 05/27/2021 2244   NITRITE NEGATIVE 05/27/2021 2244   LEUKOCYTESUR TRACE (A) 05/27/2021 2244   Sepsis Labs: @LABRCNTIP (procalcitonin:4,lacticacidven:4)  ) Recent Results (from the past 240 hour(s))  Resp Panel by RT-PCR (Flu A&B, Covid) Nasopharyngeal Swab     Status: Abnormal   Collection Time: 06/17/21  5:18 PM   Specimen: Nasopharyngeal Swab; Nasopharyngeal(NP) swabs in vial transport medium  Result Value Ref Range Status   SARS Coronavirus 2 by RT  PCR POSITIVE (A) NEGATIVE Final    Comment: RESULT CALLED TO, READ BACK BY AND VERIFIED WITH: JENNIFER GREGORY @1829  06/17/21 MJU (NOTE) SARS-CoV-2 target nucleic acids are DETECTED.  The SARS-CoV-2 RNA is generally detectable in upper respiratory specimens during the acute phase of infection. Positive results are indicative of the presence of the identified virus, but do not rule out bacterial infection or co-infection with other pathogens not detected by the test. Clinical correlation with patient history and other diagnostic information is necessary to determine patient infection status. The expected result is Negative.  Fact Sheet  for Patients: BloggerCourse.com  Fact Sheet for Healthcare Providers: SeriousBroker.it  This test is not yet approved or cleared by the [place] FDA and  has been authorized for detection and/or diagnosis of SARS-CoV-2 by FDA under an Emergency Use Authorization (EUA).  This EUA will remain in effect (meaning this test can be  used) for the duration of  the COVID-19 declaration under Section 564(b)(1) of the Act, 21 U.S.C. section 360bbb-3(b)(1), unless the authorization is terminated or revoked sooner.     Influenza A by PCR NEGATIVE NEGATIVE Final   Influenza B by PCR NEGATIVE NEGATIVE Final    Comment: (NOTE) The Xpert Xpress SARS-CoV-2/FLU/RSV plus assay is intended as an aid in the diagnosis of influenza from Nasopharyngeal swab specimens and should not be used as a sole basis for treatment. Nasal washings and aspirates are unacceptable for Xpert Xpress SARS-CoV-2/FLU/RSV testing.  Fact Sheet for Patients: BloggerCourse.com  Fact Sheet for Healthcare Providers: SeriousBroker.it  This test is not yet approved or cleared by the [place] FDA and has been authorized for detection and/or diagnosis of SARS-CoV-2 by FDA under an Emergency  Use Authorization (EUA). This EUA will remain in effect (meaning this test can be used) for the duration of the COVID-19 declaration under Section 564(b)(1) of the Act, 21 U.S.C. section 360bbb-3(b)(1), unless the authorization is terminated or revoked.  Performed at [facility], [address] [place], [place] [address]          Radiology Studies: DG Chest 2 View  Result Date: 06/17/2021 CLINICAL DATA:  Shortness of breath, abdominal distension for 3 days, difficulty taking a deep breath, hypertension, asthma EXAM: CHEST - 2 VIEW COMPARISON:  05/04/2021 FINDINGS: Enlargement of cardiac silhouette with pulmonary vascular congestion. Mediastinal contours normal. Minimal RIGHT basilar atelectasis. Atelectasis versus consolidation at retrocardiac LEFT lower lobe with associated small LEFT pleural effusion. Remaining lungs clear. No pneumothorax or acute osseous findings. IMPRESSION: Enlargement of cardiac silhouette with pulmonary vascular congestion. Minimal RIGHT basilar atelectasis. LEFT lower lobe atelectasis versus consolidation with small LEFT pleural effusion. Electronically Signed   By: Ulyses Southward M.D.   On: 06/17/2021 18:21   CT Angio Chest PE W and/or Wo Contrast  Result Date: 06/17/2021 EXAM: CT ANGIOGRAPHY CHEST WITH CONTRAST TECHNIQUE: Multidetector CT imaging of the chest was performed using the standard protocol during bolus administration of intravenous contrast. Multiplanar CT image reconstructions and MIPs were obtained to evaluate the vascular anatomy. CONTRAST:  OMNIPAQUE IOHEXOL 350 MG/ML SOLN COMPARISON:  None. FINDINGS: Cardiovascular: Fairly satisfactory opacification of the pulmonary arteries to the segmental level. Limited evaluation of the subsegmental level due to timing of contrast. No evidence of pulmonary embolism. The main pulmonary artery is normal in caliber. Enlarged left ventricle. No significant pericardial effusion. The thoracic aorta is  normal in caliber. No atherosclerotic plaque of the thoracic aorta. No coronary artery calcifications. Mediastinum/Nodes: No enlarged mediastinal, hilar, or axillary lymph nodes. Thyroid gland, trachea, and esophagus demonstrate no significant findings. Lungs/Pleura: Paraseptal emphysematous changes. No focal consolidation. No pulmonary nodule. No pulmonary mass. Trace bilateral pleural effusions, left greater than right. No pneumothorax. Upper Abdomen: No acute abnormality. Musculoskeletal: No chest wall abnormality. No acute or significant osseous findings. Review of the MIP images confirms the above findings. IMPRESSION: 1. No central or segmental pulmonary embolus. Limited evaluation of the subsegmental level. 2. Trace bilateral pleural effusions. 3. Cardiomegaly. 4.  Emphysema (ICD10-J43.9). Electronically Signed   By: Tish Frederickson M.D.   On: 06/17/2021 22:01  Scheduled Meds:  albuterol  2 puff Inhalation Q6H   enoxaparin (LOVENOX) injection  40 mg Subcutaneous Q24H   folic acid  1 mg Oral Daily   furosemide  40 mg Intravenous BID   metoprolol succinate  50 mg Oral Daily   multivitamin with minerals  1 tablet Oral Daily   nicotine  21 mg Transdermal Daily   nirmatrelvir/ritonavir EUA  3 tablet Oral BID   sodium chloride flush  3 mL Intravenous Q12H   thiamine  100 mg Oral Daily   Or   thiamine  100 mg Intravenous Daily   Continuous Infusions:  sodium chloride       LOS: 0 days    Time spent: 25 minutes    Alberteen Sam, MD [facility] 06/18/2021, 8:57 AM     Please page though AMION or Epic secure chat:  For [employer], Higher education careers adviser

## 2021-06-18 NOTE — Progress Notes (Signed)
Initial Nutrition Assessment  DOCUMENTATION CODES:   Not applicable  INTERVENTION:   Boost Breeze po TID, each supplement provides 250 kcal and 9 grams of protein  Magic cup TID with meals, each supplement provides 290 kcal and 9 grams of protein  MVI, folic acid and thiamine daily  Pt at high refeed risk; recommend monitor potassium, magnesium and phosphorus labs daily until stable  NUTRITION DIAGNOSIS:   Increased nutrient needs related to catabolic illness (COVID 19) as evidenced by estimated needs.  GOAL:   Patient will meet greater than or equal to 90% of their needs  MONITOR:   PO intake, Supplement acceptance, Labs, Weight trends, Skin, I & O's  REASON FOR ASSESSMENT:   Malnutrition Screening Tool    ASSESSMENT:   40 y.o. male with a history of recurrent alcoholic pancreatitis, chronic alcohol abuse, chronic tobacco use, hypertension, reflux and CHF who presents with COVID 19 and CHF.  Met with pt in room today. Pt reports good appetite and oral intake at baseline; pt denies any decrease in oral intake pta. Per chart, pt is down 18lbs(9%) since December 2021. Pt is aware of the recent weight loss but is unsure of how recently it occurred. Pt reports eating most of his lunch today but reports that he did not like the mac and cheese. RD discussed with pt the importance of adequate nutrition needed to preserve lean muscle. Pt does not like milky supplements but is willing to try Boost Breeze and Magic Cups. RD will add supplements and liberalize pt's diet as a heart healthy diet is restrictive of protein. Pt is likely at refeed risk r/t etoh abuse.   Medications reviewed and include: lovenox, folic acid, lasix, MVI, nicotine, thiamine  Labs reviewed: K 3.4(L), P 3.7 wnl, Mg 1.9 wnl Cbgs- 120, 118 x 24 hrs AIC 6.0(H)- 09/17/20  NUTRITION - FOCUSED PHYSICAL EXAM:  Flowsheet Row Most Recent Value  Orbital Region No depletion  Upper Arm Region Moderate depletion   Thoracic and Lumbar Region No depletion  Buccal Region No depletion  Temple Region Mild depletion  Clavicle Bone Region No depletion  Clavicle and Acromion Bone Region Moderate depletion  Scapular Bone Region Moderate depletion  Dorsal Hand No depletion  Patellar Region Mild depletion  Anterior Thigh Region Mild depletion  Posterior Calf Region Mild depletion  Edema (RD Assessment) None  Hair Reviewed  Eyes Reviewed  Mouth Reviewed  Skin Reviewed  Nails Reviewed   Diet Order:   Diet Order             Diet Heart Room service appropriate? Yes; Fluid consistency: Thin  Diet effective now                  EDUCATION NEEDS:   Education needs have been addressed  Skin:  Skin Assessment: Reviewed RN Assessment  Last BM:  8/31  Height:   Ht Readings from Last 1 Encounters:  06/17/21 6' 3" (1.905 m)    Weight:   Wt Readings from Last 1 Encounters:  06/17/21 82.6 kg    Ideal Body Weight:  89 kg  BMI:  Body mass index is 22.75 kg/m.  Estimated Nutritional Needs:   Kcal:  2400-2700kcal/day  Protein:  >120g/day  Fluid:  2.0L/day    MS, RD, LDN Please refer to AMION for RD and/or RD on-call/weekend/after hours pager  

## 2021-06-18 NOTE — Progress Notes (Signed)
Update 2200: Pt requesting ice water. Ice water given. Will continue to monitor.  Update 0000: Pt requesting for ice water. Ice water given. Will continue to monitor.  Update 0600: Pt requested ice water. Ice water given. Will continue to monitor

## 2021-06-18 NOTE — Clinical Social Work Note (Signed)
Patient on COVID isolation precautions. Tried calling in room for heart failure screen and to offer SA resources. No answer. Will try again later.  Charlynn Court, CSW [phone]

## 2021-06-18 NOTE — Progress Notes (Signed)
*  PRELIMINARY RESULTS* Echocardiogram 2D Echocardiogram has been performed.  Mario Beck 06/18/2021, 2:52 PM

## 2021-06-19 DIAGNOSIS — I5023 Acute on chronic systolic (congestive) heart failure: Secondary | ICD-10-CM

## 2021-06-19 LAB — CBC WITH DIFFERENTIAL/PLATELET
Abs Immature Granulocytes: 0.01 10*3/uL (ref 0.00–0.07)
Basophils Absolute: 0 10*3/uL (ref 0.0–0.1)
Basophils Relative: 1 %
Eosinophils Absolute: 0 10*3/uL (ref 0.0–0.5)
Eosinophils Relative: 1 %
HCT: 33.2 % — ABNORMAL LOW (ref 39.0–52.0)
Hemoglobin: 11.8 g/dL — ABNORMAL LOW (ref 13.0–17.0)
Immature Granulocytes: 0 %
Lymphocytes Relative: 35 %
Lymphs Abs: 2.2 10*3/uL (ref 0.7–4.0)
MCH: 30.3 pg (ref 26.0–34.0)
MCHC: 35.5 g/dL (ref 30.0–36.0)
MCV: 85.3 fL (ref 80.0–100.0)
Monocytes Absolute: 0.6 10*3/uL (ref 0.1–1.0)
Monocytes Relative: 9 %
Neutro Abs: 3.4 10*3/uL (ref 1.7–7.7)
Neutrophils Relative %: 54 %
Platelets: 218 10*3/uL (ref 150–400)
RBC: 3.89 MIL/uL — ABNORMAL LOW (ref 4.22–5.81)
RDW: 14.6 % (ref 11.5–15.5)
WBC: 6.2 10*3/uL (ref 4.0–10.5)
nRBC: 0.3 % — ABNORMAL HIGH (ref 0.0–0.2)

## 2021-06-19 LAB — BASIC METABOLIC PANEL
Anion gap: 12 (ref 5–15)
BUN: 19 mg/dL (ref 6–20)
CO2: 27 mmol/L (ref 22–32)
Calcium: 8.9 mg/dL (ref 8.9–10.3)
Chloride: 100 mmol/L (ref 98–111)
Creatinine, Ser: 1.02 mg/dL (ref 0.61–1.24)
GFR, Estimated: >60 mL/min (ref >60)
Glucose, Bld: 119 mg/dL — ABNORMAL HIGH (ref 70–99)
Potassium: 3.1 mmol/L — ABNORMAL LOW (ref 3.5–5.1)
Sodium: 139 mmol/L (ref 135–145)

## 2021-06-19 LAB — D-DIMER, QUANTITATIVE: D-Dimer, Quant: 1.2 ug/mL-FEU — ABNORMAL HIGH (ref 0.00–0.50)

## 2021-06-19 LAB — C-REACTIVE PROTEIN: CRP: 1 mg/dL — ABNORMAL HIGH (ref <1.0)

## 2021-06-19 MED ORDER — SPIRONOLACTONE 25 MG PO TABS
12.5000 mg | ORAL_TABLET | Freq: Every day | ORAL | Status: DC
  Administered 2021-06-19: 12.5 mg via ORAL
  Filled 2021-06-19: qty 0.5
  Filled 2021-06-19: qty 1
  Filled 2021-06-19: qty 0.5
  Filled 2021-06-19: qty 1
  Filled 2021-06-19 (×2): qty 0.5

## 2021-06-19 MED ORDER — MELATONIN 5 MG PO TABS
5.0000 mg | ORAL_TABLET | Freq: Every evening | ORAL | Status: DC | PRN
  Administered 2021-06-20 (×2): 5 mg via ORAL
  Filled 2021-06-19 (×2): qty 1

## 2021-06-19 MED ORDER — ISOSORB DINITRATE-HYDRALAZINE 20-37.5 MG PO TABS
1.0000 | ORAL_TABLET | Freq: Three times a day (TID) | ORAL | Status: DC
  Administered 2021-06-19 – 2021-06-21 (×6): 1 via ORAL
  Filled 2021-06-19 (×9): qty 1

## 2021-06-19 MED ORDER — FUROSEMIDE 10 MG/ML IJ SOLN
20.0000 mg | Freq: Two times a day (BID) | INTRAMUSCULAR | Status: DC
  Administered 2021-06-19: 20 mg via INTRAVENOUS
  Filled 2021-06-19 (×2): qty 4

## 2021-06-19 MED ORDER — METOPROLOL SUCCINATE ER 100 MG PO TB24
100.0000 mg | ORAL_TABLET | Freq: Every day | ORAL | Status: DC
  Administered 2021-06-19 – 2021-06-21 (×3): 100 mg via ORAL
  Filled 2021-06-19 (×3): qty 1

## 2021-06-19 MED ORDER — HYDROCOD POLST-CPM POLST ER 10-8 MG/5ML PO SUER
5.0000 mL | Freq: Two times a day (BID) | ORAL | Status: DC | PRN
  Administered 2021-06-20: 5 mL via ORAL
  Filled 2021-06-19 (×2): qty 5

## 2021-06-19 MED ORDER — SACUBITRIL-VALSARTAN 24-26 MG PO TABS: 1.0000 | ORAL_TABLET | Freq: Two times a day (BID) | ORAL | Status: DC

## 2021-06-19 MED ORDER — POTASSIUM CHLORIDE CRYS ER 20 MEQ PO TBCR
40.0000 meq | EXTENDED_RELEASE_TABLET | Freq: Two times a day (BID) | ORAL | Status: DC
  Administered 2021-06-19 – 2021-06-21 (×5): 40 meq via ORAL
  Filled 2021-06-19 (×5): qty 2

## 2021-06-19 NOTE — Progress Notes (Signed)
[facility] PROGRESS NOTE    Mario Beck  VZD:[id number] DOB: 1980-11-19 DOA: 06/17/2021 PCP: Pcp, No      Brief Narrative:  Mr. Mario Beck is a 40 y.o. 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  Actively drinking, last drink 24 hours prior to admission.  Vaccinated 1 dose.  COVID-positive in the ER.  Chest imaging ruled out PE, shows no airspace disease, only effusions, BNP >3000.  K 3.1.        Assessment & Plan:  Acute on chronic systolic and diastolic CHF Alcoholic cardiomyopathy Net -2.3 L yesterday per 4.2 on admission, still orthopneic and dyspneic with just minimal exertion.  Creatinine stable, potassium low.  Echocardiogram yesterday showed EF now reduced less than 20%.  -Consult cardiology - Continue furosemide 40 IV twice daily - Start potassium supplement - Strict ins and outs, daily weights, telemetry - Daily BMP - Start Entresto and carvedilol -Strict alcohol cessation was recommended   Incidental COVID Mild fatigue, mild cough are only symptoms.  - Continue Paxlovid -Continue cough suppressants   Recurrent alcoholic pancreatitis Lipase normal, no epigastric pain  Alcohol use disorder No evidence of alcohol withdrawal delirium. - Continue thiamine and folate - Continue CIWA scoring with on-demand lorazepam    Asthma, mild intermittent No active disease - Albuterol as needed  Hypokalemia - Supplement potassium  Hypomagnesemia Magnesium supplemented              Disposition: Status is: Observation  The patient will require care spanning > 2 midnights and should be moved to inpatient because: he has severe ongoing symptoms and concomintant covid  Dispo: The patient is from: Home              Anticipated d/c is to: Home              Patient  currently is not medically stable to d/c.   Difficult to place patient No   Level of care: Med-Surg    Patient was admitted with congestive heart failure symptoms, found to have a EF reduced down to 20%.  Cardiology consulted, he is still symptomatic we will continue Lasix       MDM: The below labs and imaging reports were reviewed and summarized above.  Medication management as above.     DVT prophylaxis: enoxaparin (LOVENOX) injection 40 mg Start: 06/17/21 2300  Code Status: full Family Communication: Fiance at the bedside    Consultants:  Cardiology  Procedures:  9/2 echocardiogram: EF less than 20%, no significant valvular disease  Antimicrobials:     Culture data:             Subjective: Orthopnea and shortness of breath slightly better, but still significant.  Cough with some post tussive chest pain.  No exertional symptoms, no fever, chills, runny nose    Objective: Vitals:   06/18/21 2321 06/19/21 0434 06/19/21 0910 06/19/21 1048  BP: (!) 132/101 135/83 (!) 137/105 (!) 125/100  Pulse: 92 100 (!) 101 (!) 103  Resp: 17 16 18    Temp: 98.2 F (36.8 C) 99 F (37.2 C) 97.9 F (36.6 C)   TempSrc:   Oral   SpO2: 100% 95% 90%   Weight:      Height:        Intake/Output Summary (Last 24 hours) at 06/19/2021 1316 Last data filed  at 06/19/2021 1047 Gross per 24 hour  Intake --  Output 2225 ml  Net -2225 ml   Filed Weights   06/17/21 1351  Weight: 82.6 kg    Examination: General appearance: Adult male, lying in bed, no acute distress     HEENT: Anicteric, conjunctive are pink, lids and lashes normal.  No nasal deformity, discharge, or epistaxis. Skin: No suspicious rashes or lesions, skin warm and dry Cardiac: RRR, no murmurs, no lower extremity edema, JVP normal, abdomen feels "full " Respiratory: Respiratory rate and rhythm, lungs clear without rales or wheezes Abdomen: Abdomen soft without tenderness palpation or guarding, no ascites or  distention MSK: Normal muscle bulk and tone Neuro: Awake and alert, extraocular movements intact, moves all extremities with normal strength and coordination, speech fluent Psych: Attention normal, affect blunted, judgment insight appear normal     Data Reviewed: I have personally reviewed following labs and imaging studies:  CBC: Recent Labs  Lab 06/17/21 1406 06/18/21 0153 06/19/21 0427  WBC 6.7 6.3 6.2  NEUTROABS  --  3.3 3.4  HGB 12.3* 11.8* 11.8*  HCT 34.8* 33.6* 33.2*  MCV 85.1 85.5 85.3  PLT 201 212 218   Basic Metabolic Panel: Recent Labs  Lab 06/17/21 1406 06/18/21 0153 06/19/21 0427  NA 136 137 139  K 3.1* 3.4* 3.1*  CL 100 102 100  CO2 24 25 27   GLUCOSE 120* 118* 119*  BUN 21* 21* 19  CREATININE 1.17 1.07 1.02  CALCIUM 8.7* 8.4* 8.9  MG 1.7 1.9  --   PHOS  --  3.7  --    GFR: Estimated Creatinine Clearance: 114.7 mL/min (by C-G formula based on SCr of 1.02 mg/dL). Liver Function Tests: Recent Labs  Lab 06/17/21 1406 06/18/21 0153  AST 50* 36  ALT 35 29  ALKPHOS 172* 149*  BILITOT 1.0 0.8  PROT 6.8 6.2*  ALBUMIN 3.5 3.3*   Recent Labs  Lab 06/17/21 1406  LIPASE 18   No results for input(s): AMMONIA in the last 168 hours. Coagulation Profile: Recent Labs  Lab 06/18/21 0153  INR 1.2   Cardiac Enzymes: No results for input(s): CKTOTAL, CKMB, CKMBINDEX, TROPONINI in the last 168 hours. BNP (last 3 results) No results for input(s): PROBNP in the last 8760 hours. HbA1C: No results for input(s): HGBA1C in the last 72 hours. CBG: No results for input(s): GLUCAP in the last 168 hours. Lipid Profile: No results for input(s): CHOL, HDL, LDLCALC, TRIG, CHOLHDL, LDLDIRECT in the last 72 hours. Thyroid Function Tests: Recent Labs    06/18/21 0153  TSH 1.402   Anemia Panel: Recent Labs    06/18/21 0153  FERRITIN 34   Urine analysis:    Component Value Date/Time   COLORURINE YELLOW (A) 05/27/2021 2244   APPEARANCEUR HAZY (A)  05/27/2021 2244   LABSPEC 1.018 05/27/2021 2244   PHURINE 5.0 05/27/2021 2244   GLUCOSEU 50 (A) 05/27/2021 2244   HGBUR SMALL (A) 05/27/2021 2244   BILIRUBINUR NEGATIVE 05/27/2021 2244   KETONESUR NEGATIVE 05/27/2021 2244   PROTEINUR 100 (A) 05/27/2021 2244   NITRITE NEGATIVE 05/27/2021 2244   LEUKOCYTESUR TRACE (A) 05/27/2021 2244   Sepsis Labs: @LABRCNTIP (procalcitonin:4,lacticacidven:4)  ) Recent Results (from the past 240 hour(s))  Resp Panel by RT-PCR (Flu A&B, Covid) Nasopharyngeal Swab     Status: Abnormal   Collection Time: 06/17/21  5:18 PM   Specimen: Nasopharyngeal Swab; Nasopharyngeal(NP) swabs in vial transport medium  Result Value Ref Range Status   SARS Coronavirus 2  by RT PCR POSITIVE (A) NEGATIVE Final    Comment: RESULT CALLED TO, READ BACK BY AND VERIFIED WITH: JENNIFER GREGORY @1829  06/17/21 MJU (NOTE) SARS-CoV-2 target nucleic acids are DETECTED.  The SARS-CoV-2 RNA is generally detectable in upper respiratory specimens during the acute phase of infection. Positive results are indicative of the presence of the identified virus, but do not rule out bacterial infection or co-infection with other pathogens not detected by the test. Clinical correlation with patient history and other diagnostic information is necessary to determine patient infection status. The expected result is Negative.  Fact Sheet for Patients: BloggerCourse.com  Fact Sheet for Healthcare Providers: SeriousBroker.it  This test is not yet approved or cleared by the [place] FDA and  has been authorized for detection and/or diagnosis of SARS-CoV-2 by FDA under an Emergency Use Authorization (EUA).  This EUA will remain in effect (meaning this test can be  used) for the duration of  the COVID-19 declaration under Section 564(b)(1) of the Act, 21 U.S.C. section 360bbb-3(b)(1), unless the authorization is terminated or revoked  sooner.     Influenza A by PCR NEGATIVE NEGATIVE Final   Influenza B by PCR NEGATIVE NEGATIVE Final    Comment: (NOTE) The Xpert Xpress SARS-CoV-2/FLU/RSV plus assay is intended as an aid in the diagnosis of influenza from Nasopharyngeal swab specimens and should not be used as a sole basis for treatment. Nasal washings and aspirates are unacceptable for Xpert Xpress SARS-CoV-2/FLU/RSV testing.  Fact Sheet for Patients: BloggerCourse.com  Fact Sheet for Healthcare Providers: SeriousBroker.it  This test is not yet approved or cleared by the [place] FDA and has been authorized for detection and/or diagnosis of SARS-CoV-2 by FDA under an Emergency Use Authorization (EUA). This EUA will remain in effect (meaning this test can be used) for the duration of the COVID-19 declaration under Section 564(b)(1) of the Act, 21 U.S.C. section 360bbb-3(b)(1), unless the authorization is terminated or revoked.  Performed at [facility], [address], [place], [place] [address]          Radiology Studies: DG Chest 2 View  Result Date: 06/17/2021 CLINICAL DATA:  Shortness of breath, abdominal distension for 3 days, difficulty taking a deep breath, hypertension, asthma EXAM: CHEST - 2 VIEW COMPARISON:  05/04/2021 FINDINGS: Enlargement of cardiac silhouette with pulmonary vascular congestion. Mediastinal contours normal. Minimal RIGHT basilar atelectasis. Atelectasis versus consolidation at retrocardiac LEFT lower lobe with associated small LEFT pleural effusion. Remaining lungs clear. No pneumothorax or acute osseous findings. IMPRESSION: Enlargement of cardiac silhouette with pulmonary vascular congestion. Minimal RIGHT basilar atelectasis. LEFT lower lobe atelectasis versus consolidation with small LEFT pleural effusion. Electronically Signed   By: Ulyses Southward M.D.   On: 06/17/2021 18:21   CT Angio Chest PE W and/or Wo  Contrast  Result Date: 06/17/2021 EXAM: CT ANGIOGRAPHY CHEST WITH CONTRAST TECHNIQUE: Multidetector CT imaging of the chest was performed using the standard protocol during bolus administration of intravenous contrast. Multiplanar CT image reconstructions and MIPs were obtained to evaluate the vascular anatomy. CONTRAST:  OMNIPAQUE IOHEXOL 350 MG/ML SOLN COMPARISON:  None. FINDINGS: Cardiovascular: Fairly satisfactory opacification of the pulmonary arteries to the segmental level. Limited evaluation of the subsegmental level due to timing of contrast. No evidence of pulmonary embolism. The main pulmonary artery is normal in caliber. Enlarged left ventricle. No significant pericardial effusion. The thoracic aorta is normal in caliber. No atherosclerotic plaque of the thoracic aorta. No coronary artery calcifications. Mediastinum/Nodes: No enlarged mediastinal, hilar, or  axillary lymph nodes. Thyroid gland, trachea, and esophagus demonstrate no significant findings. Lungs/Pleura: Paraseptal emphysematous changes. No focal consolidation. No pulmonary nodule. No pulmonary mass. Trace bilateral pleural effusions, left greater than right. No pneumothorax. Upper Abdomen: No acute abnormality. Musculoskeletal: No chest wall abnormality. No acute or significant osseous findings. Review of the MIP images confirms the above findings. IMPRESSION: 1. No central or segmental pulmonary embolus. Limited evaluation of the subsegmental level. 2. Trace bilateral pleural effusions. 3. Cardiomegaly. 4.  Emphysema (ICD10-J43.9). Electronically Signed   By: Tish Frederickson M.D.   On: 06/17/2021 22:01   ECHOCARDIOGRAM COMPLETE  Result Date: 06/18/2021    ECHOCARDIOGRAM REPORT   Patient Name:   RANDEN KAUTH Kawabata Date of Exam: 06/18/2021 Medical Rec #:  [id number]     Height:       75.0 in Accession #:    [id number]    Weight:       182.0 lb Date of Birth:  20-Aug-1981     BSA:          2.108 m Patient Age:    40 years      BP:            122/103 mmHg Patient Gender: M             HR:           97 bpm. Exam Location:  [facility] Procedure: 2D Echo, Cardiac Doppler and Color Doppler Indications:     CHF-acute systolic I50.21  History:         Patient has prior history of Echocardiogram examinations, most                  recent 09/16/2020. Risk Factors:Hypertension.  Sonographer:     Cristela Blue Referring Phys:  Kenn File DOUTOVA Diagnosing Phys: [name] MD IMPRESSIONS  1. Left ventricular ejection fraction, by estimation, is <20%. The left ventricle has severely decreased function. The left ventricle demonstrates global hypokinesis. The left ventricular internal cavity size was moderately to severely dilated. There is  mild left ventricular hypertrophy. Left ventricular diastolic parameters are consistent with Grade II diastolic dysfunction (pseudonormalization).  2. Right ventricular systolic function is moderately reduced. The right ventricular size is mildly enlarged. There is normal pulmonary artery systolic pressure.  3. Left atrial size was severely dilated.  4. Right atrial size was moderately dilated.  5. The mitral valve is normal in structure. Moderate to severe mitral valve regurgitation. No evidence of mitral stenosis.  6. Tricuspid valve regurgitation is moderate.  7. The aortic valve is normal in structure. Aortic valve regurgitation is not visualized. No aortic stenosis is present.  8. The inferior vena cava is normal in size with greater than 50% respiratory variability, suggesting right atrial pressure of 3 mmHg. FINDINGS  Left Ventricle: Left ventricular ejection fraction, by estimation, is <20%. The left ventricle has severely decreased function. The left ventricle demonstrates global hypokinesis. The left ventricular internal cavity size was moderately to severely dilated. There is mild left ventricular hypertrophy. Left ventricular diastolic parameters are consistent with Grade II diastolic dysfunction (pseudonormalization).  Right Ventricle: The right ventricular size is mildly enlarged. No increase in right ventricular wall thickness. Right ventricular systolic function is moderately reduced. There is normal pulmonary artery systolic pressure. The tricuspid regurgitant velocity is 2.38 m/s, and with an assumed right atrial pressure of 3 mmHg, the estimated right ventricular systolic pressure is 25.7 mmHg. Left Atrium: Left atrial size was severely dilated. Right Atrium: Right atrial  size was moderately dilated. Pericardium: There is no evidence of pericardial effusion. Mitral Valve: The mitral valve is normal in structure. Moderate to severe mitral valve regurgitation. No evidence of mitral valve stenosis. Tricuspid Valve: The tricuspid valve is normal in structure. Tricuspid valve regurgitation is moderate . No evidence of tricuspid stenosis. Aortic Valve: The aortic valve is normal in structure. Aortic valve regurgitation is not visualized. No aortic stenosis is present. Aortic valve mean gradient measures 1.3 mmHg. Aortic valve peak gradient measures 2.6 mmHg. Aortic valve area, by VTI measures 1.69 cm. Pulmonic Valve: The pulmonic valve was normal in structure. Pulmonic valve regurgitation is mild. No evidence of pulmonic stenosis. Aorta: The aortic root is normal in size and structure. Venous: The inferior vena cava is normal in size with greater than 50% respiratory variability, suggesting right atrial pressure of 3 mmHg. IAS/Shunts: No atrial level shunt detected by color flow Doppler.  LEFT VENTRICLE PLAX 2D LVIDd:         6.90 cm      Diastology LVIDs:         6.40 cm      LV e' medial:    5.11 cm/s LV PW:         1.30 cm      LV E/e' medial:  18.3 LV IVS:        1.10 cm      LV e' lateral:   6.96 cm/s LVOT diam:     2.30 cm      LV E/e' lateral: 13.4 LV SV:         16 LV SV Index:   8 LVOT Area:     4.15 cm  LV Volumes (MOD) LV vol d, MOD A2C: 308.0 ml LV vol d, MOD A4C: 299.0 ml LV vol s, MOD A2C: 257.0 ml LV vol s, MOD  A4C: 238.0 ml LV SV MOD A2C:     51.0 ml LV SV MOD A4C:     299.0 ml LV SV MOD BP:      47.9 ml RIGHT VENTRICLE RV Basal diam:  5.40 cm RV S prime:     8.05 cm/s TAPSE (M-mode): 5.3 cm LEFT ATRIUM              Index       RIGHT ATRIUM           Index LA diam:        5.20 cm  2.47 cm/m  RA Area:     25.50 cm LA Vol (A2C):   133.0 ml 63.09 ml/m RA Volume:   73.00 ml  34.63 ml/m LA Vol (A4C):   127.0 ml 60.24 ml/m LA Biplane Vol: 131.0 ml 62.14 ml/m  AORTIC VALVE                   PULMONIC VALVE AV Area (Vmax):    1.89 cm    PV Vmax:        0.37 m/s AV Area (Vmean):   1.65 cm    PV Peak grad:   0.6 mmHg AV Area (VTI):     1.69 cm    RVOT Peak grad: 1 mmHg AV Vmax:           80.33 cm/s AV Vmean:          53.300 cm/s AV VTI:            0.098 m AV Peak Grad:      2.6 mmHg AV Mean Grad:  1.3 mmHg LVOT Vmax:         36.60 cm/s LVOT Vmean:        21.200 cm/s LVOT VTI:          0.040 m LVOT/AV VTI ratio: 0.41  AORTA Ao Root diam: 3.00 cm MITRAL VALVE               TRICUSPID VALVE MV Area (PHT): 6.02 cm    TR Peak grad:   22.7 mmHg MV Decel Time: 126 msec    TR Vmax:        238.00 cm/s MV E velocity: 93.40 cm/s                            SHUNTS                            Systemic VTI:  0.04 m                            Systemic Diam: 2.30 cm [name] MD Electronically signed by [name] MD Signature Date/Time: 06/18/2021/3:51:01 PM    Final         Scheduled Meds:  albuterol  2 puff Inhalation Q6H   enoxaparin (LOVENOX) injection  40 mg Subcutaneous Q24H   feeding supplement  1 Container Oral TID BM   folic acid  1 mg Oral Daily   furosemide  20 mg Intravenous BID   isosorbide-hydrALAZINE  1 tablet Oral TID   metoprolol succinate  100 mg Oral Daily   multivitamin with minerals  1 tablet Oral Daily   nicotine  21 mg Transdermal Daily   nirmatrelvir/ritonavir EUA  3 tablet Oral BID   potassium chloride  40 mEq Oral BID   sodium chloride flush  3 mL Intravenous Q12H   spironolactone   12.5 mg Oral Daily   thiamine  100 mg Oral Daily   Or   thiamine  100 mg Intravenous Daily   Continuous Infusions:  sodium chloride       LOS: 1 day    Time spent: 35 minutes    Alberteen Sam, MD [facility] 06/19/2021, 1:16 PM     Please page though AMION or Epic secure chat:  For [employer], Higher education careers adviser

## 2021-06-19 NOTE — Progress Notes (Signed)
CSW attempted call into patient's room due to isolation status. No answer. Asked RN to have patient call CSW when able.   Alfonso Ramus, [place] [phone]

## 2021-06-19 NOTE — Progress Notes (Signed)
Patient give instructions to keep follow up appointments, when to return for worsening symptoms, Heart Failure education given, abstaining from alcohol understood by patient, IV taken out, patient given medications for 2 days & to pick up meds from medical management, & patient discharged via wheelchair with nurse.

## 2021-06-19 NOTE — TOC Initial Note (Addendum)
Transition of Care [facility]) - Initial/Assessment Note    Patient Details  Name: Mario Beck MRN: [id number] Date of Birth: Sep 11, 1981  [facility]) CM/SW Contact:    Liliana Cline, LCSW Phone Number: 06/19/2021, 9:49 AM  Clinical Narrative:       TOC consulted for SA resources and HF screening. Patient is also uninsured and has a high readmission risk score. Spoke to patient who is interested in PCP/medication resources. Explained [facility] and will provide application to RN to give to patient due to patient's isolation status. Also made referral to [facility] Rep [place] in Epic. Patient states he knows to monitor his weight for heart failure and denies questions or needs regarding heart failure. Patient declines SA resources at this time.            Expected Discharge Plan: Home/Self Care Barriers to Discharge: Continued Medical Work up   Patient Goals and CMS Choice Patient states their goals for this hospitalization and ongoing recovery are:: to return home CMS Medicare.gov Compare Post Acute Care list provided to:: Patient Choice offered to / list presented to : Patient  Expected Discharge Plan and Services Expected Discharge Plan: Home/Self Care       Living arrangements for the past 2 months: Single Family Home                                      Prior Living Arrangements/Services Living arrangements for the past 2 months: Single Family Home   Patient language and need for interpreter reviewed:: Yes Do you feel safe going back to the place where you live?: Yes      Need for Family Participation in Patient Care: Yes (Comment) Care giver support system in place?: Yes (comment)   Criminal Activity/Legal Involvement Pertinent to Current Situation/Hospitalization: No - Comment as needed  Activities of Daily Living Home Assistive Devices/Equipment: None ADL Screening (condition at time of admission) Patient's cognitive  ability adequate to safely complete daily activities?: Yes Is the patient deaf or have difficulty hearing?: Yes Does the patient have difficulty seeing, even when wearing glasses/contacts?: No Does the patient have difficulty concentrating, remembering, or making decisions?: No Patient able to express need for assistance with ADLs?: Yes Does the patient have difficulty dressing or bathing?: No Independently performs ADLs?: Yes (appropriate for developmental age) Does the patient have difficulty walking or climbing stairs?: Yes (dyspnea on exertion) Weakness of Legs: Both Weakness of Arms/Hands: Both  Permission Sought/Granted Permission sought to share information with : Oceanographer granted to share information with : Yes, Verbal Permission Granted     Permission granted to share info w AGENCY: [facility]        Emotional Assessment       Orientation: : Oriented to Self, Oriented to Place, Oriented to  Time, Oriented to Situation   Psych Involvement: No (comment)  Admission diagnosis:  Hypokalemia [E87.6] Acute on chronic systolic CHF (congestive heart failure) (HCC) [I50.23] Congestive heart failure, unspecified HF chronicity, unspecified heart failure type (HCC) [I50.9] COVID [U07.1] CHF (congestive heart failure) (HCC) [I50.9] Patient Active Problem List   Diagnosis Date Noted   Hypokalemia 06/18/2021   COVID-19 virus infection 06/18/2021   Hypomagnesemia 06/18/2021   CHF (congestive heart failure) (HCC) 06/18/2021   Acute on chronic systolic CHF (congestive heart failure) (HCC) 06/17/2021   Acute on  chronic combined systolic and diastolic CHF (congestive heart failure) (HCC) 06/17/2021   Epigastric pain    Alcohol withdrawal syndrome without complication (HCC)    Recurrent pancreatitis 02/19/2021   Chronic systolic CHF (congestive heart failure) (HCC) 09/17/2020   Alcoholic cardiomyopathy (HCC)  09/17/2020   Alcoholic pancreatitis 09/16/2020   Tobacco abuse 09/16/2020   Abnormal EKG 09/16/2020   GERD (gastroesophageal reflux disease) 09/16/2020   Depression 09/16/2020   Dark stools 09/16/2020   Elevated troponin 09/16/2020   Transaminitis 03/31/2020   Nausea and vomiting 02/21/2020   Intractable nausea and vomiting 02/21/2020   Tobacco abuse counseling    Acute alcoholic pancreatitis 02/18/2020   HTN (hypertension), malignant 02/18/2020   Alcohol use disorder, moderate, dependence (HCC) 02/18/2020   Benign essential HTN 12/01/2015   PCP:  Pcp, No Pharmacy:   [facility] [address] [place] - [address] [place] [address] Phone: [phone] Fax: [phone]  [facility] [address], [address] [place] [address] Phone: [phone] Fax: [phone]  [facility] - [place], [place] - [address] [place] [address] Phone: [phone] Fax: [phone]  [id number] [place], [place] - [address] AT [facility] [address] [place] [address] Phone: [phone] Fax: [phone]  [id number] - [place], [place] - [address] [facility] [place] [address] Phone: [phone] Fax: [phone]     Social Determinants of Health (SDOH) Interventions    Readmission Risk Interventions Readmission Risk Prevention Plan 06/19/2021  Transportation Screening Complete  PCP or Specialist Appt within 5-7 Days Complete  Home Care Screening Complete  Medication Review (RN CM) Complete  Some recent data might be hidden

## 2021-06-19 NOTE — Consult Note (Signed)
Mario Beck is a 40 y.o. male  [id number]  Primary Cardiologist: Adrian Blackwater Reason for Consultation: CHF  HPI: This 40 year old African-American male with history of alcoholic cardiomyopathy presented to the hospital with shortness of breath and now has ejection fraction 20%.  Patient appears to be comfortable no chest pain but some shortness of breath.   Review of Systems:   Past Medical History:  Diagnosis Date   Alcohol abuse    Asthma    Depression    Hypertension     Medications Prior to Admission  Medication Sig Dispense Refill   furosemide (LASIX) 40 MG tablet Take 1 tablet (40 mg total) by mouth daily. (Patient taking differently: Take 80 mg by mouth daily.) 30 tablet 11   LORazepam (ATIVAN) 1 MG tablet Take 1 tablet (1 mg total) by mouth every 8 (eight) hours as needed for anxiety. 15 tablet 0   Multiple Vitamin (MULTIVITAMIN WITH MINERALS) TABS tablet Take 1 tablet by mouth daily. 90 tablet 0   metoprolol succinate (TOPROL-XL) 25 MG 24 hr tablet Take 25 mg by mouth daily.     ondansetron (ZOFRAN) 4 MG tablet Take 4 mg by mouth every 8 (eight) hours as needed. (Patient not taking: Reported on 06/18/2021)     oxyCODONE-acetaminophen (PERCOCET/ROXICET) 5-325 MG tablet Take 1 tablet by mouth every 4 (four) hours as needed for severe pain. (Patient not taking: No sig reported) 10 tablet 0   thiamine 100 MG tablet Take 1 tablet (100 mg total) by mouth daily. (Patient not taking: No sig reported) 90 tablet 0      albuterol  2 puff Inhalation Q6H   enoxaparin (LOVENOX) injection  40 mg Subcutaneous Q24H   feeding supplement  1 Container Oral TID BM   folic acid  1 mg Oral Daily   furosemide  40 mg Intravenous BID   metoprolol succinate  50 mg Oral Daily   multivitamin with minerals  1 tablet Oral Daily   nicotine  21 mg Transdermal Daily   nirmatrelvir/ritonavir EUA  3 tablet Oral BID   potassium chloride  40 mEq Oral BID   sodium chloride flush  3 mL Intravenous  Q12H   thiamine  100 mg Oral Daily   Or   thiamine  100 mg Intravenous Daily    Infusions:  sodium chloride      Allergies  Allergen Reactions   Lisinopril Swelling    Angioedema    Shellfish Allergy Swelling    Social History   Socioeconomic History   Marital status: Legally Separated    Spouse name: Not on file   Number of children: 2   Years of education: Not on file   Highest education level: Not on file  Occupational History   Occupation: Production Management  Tobacco Use   Smoking status: Every Day    Packs/day: 1.00    Types: Cigarettes   Smokeless tobacco: Current    Types: Snuff   Tobacco comments:    Does pouches as welll   Vaping Use   Vaping Use: Never used  Substance and Sexual Activity   Alcohol use: Yes    Alcohol/week: 7.0 standard drinks    Types: 7 Cans of beer per week   Drug use: No   Sexual activity: Yes  Other Topics Concern   Not on file  Social History Narrative   Not on file   Social Determinants of Health   Financial Resource Strain: Not on file  Food Insecurity:  Not on file  Transportation Needs: Not on file  Physical Activity: Not on file  Stress: Not on file  Social Connections: Not on file  Intimate Partner Violence: Not on file    Family History  Problem Relation Age of Onset   Colon cancer Neg Hx    Esophageal cancer Neg Hx     PHYSICAL EXAM: Vitals:   06/19/21 0434 06/19/21 0910  BP: 135/83 (!) 137/105  Pulse: 100 (!) 101  Resp: 16 18  Temp: 99 F (37.2 C) 97.9 F (36.6 C)  SpO2: 95% 90%     Intake/Output Summary (Last 24 hours) at 06/19/2021 1031 Last data filed at 06/19/2021 0540 Gross per 24 hour  Intake --  Output 1675 ml  Net -1675 ml    General:  Well appearing. No respiratory difficulty HEENT: normal Neck: supple. no JVD. Carotids 2+ bilat; no bruits. No lymphadenopathy or thryomegaly appreciated. Cor: PMI nondisplaced. Regular rate & rhythm. No rubs, gallops or murmurs. Lungs:  clear Abdomen: soft, nontender, nondistended. No hepatosplenomegaly. No bruits or masses. Good bowel sounds. Extremities: no cyanosis, clubbing, rash, edema Neuro: alert & oriented x 3, cranial nerves grossly intact. moves all 4 extremities w/o difficulty. Affect pleasant.  ECG: Sinus tachycardia with no acute changes  Results for orders placed or performed during the hospital encounter of 06/17/21 (from the past 24 hour(s))  CBC with Differential/Platelet     Status: Abnormal   Collection Time: 06/19/21  4:27 AM  Result Value Ref Range   WBC 6.2 4.0 - 10.5 K/uL   RBC 3.89 (L) 4.22 - 5.81 MIL/uL   Hemoglobin 11.8 (L) 13.0 - 17.0 g/dL   HCT 59.1 (L) 63.8 - 46.6 %   MCV 85.3 80.0 - 100.0 fL   MCH 30.3 26.0 - 34.0 pg   MCHC 35.5 30.0 - 36.0 g/dL   RDW 59.9 35.7 - 01.7 %   Platelets 218 150 - 400 K/uL   nRBC 0.3 (H) 0.0 - 0.2 %   Neutrophils Relative % 54 %   Neutro Abs 3.4 1.7 - 7.7 K/uL   Lymphocytes Relative 35 %   Lymphs Abs 2.2 0.7 - 4.0 K/uL   Monocytes Relative 9 %   Monocytes Absolute 0.6 0.1 - 1.0 K/uL   Eosinophils Relative 1 %   Eosinophils Absolute 0.0 0.0 - 0.5 K/uL   Basophils Relative 1 %   Basophils Absolute 0.0 0.0 - 0.1 K/uL   Immature Granulocytes 0 %   Abs Immature Granulocytes 0.01 0.00 - 0.07 K/uL  C-reactive protein     Status: Abnormal   Collection Time: 06/19/21  4:27 AM  Result Value Ref Range   CRP 1.0 (H) <1.0 mg/dL  D-dimer, quantitative     Status: Abnormal   Collection Time: 06/19/21  4:27 AM  Result Value Ref Range   D-Dimer, Quant 1.20 (H) 0.00 - 0.50 ug/mL-FEU  Basic metabolic panel     Status: Abnormal   Collection Time: 06/19/21  4:27 AM  Result Value Ref Range   Sodium 139 135 - 145 mmol/L   Potassium 3.1 (L) 3.5 - 5.1 mmol/L   Chloride 100 98 - 111 mmol/L   CO2 27 22 - 32 mmol/L   Glucose, Bld 119 (H) 70 - 99 mg/dL   BUN 19 6 - 20 mg/dL   Creatinine, Ser 7.93 0.61 - 1.24 mg/dL   Calcium 8.9 8.9 - 90.3 mg/dL   GFR, Estimated >00 >92  mL/min   Anion gap 12 5 -  15   DG Chest 2 View  Result Date: 06/17/2021 CLINICAL DATA:  Shortness of breath, abdominal distension for 3 days, difficulty taking a deep breath, hypertension, asthma EXAM: CHEST - 2 VIEW COMPARISON:  05/04/2021 FINDINGS: Enlargement of cardiac silhouette with pulmonary vascular congestion. Mediastinal contours normal. Minimal RIGHT basilar atelectasis. Atelectasis versus consolidation at retrocardiac LEFT lower lobe with associated small LEFT pleural effusion. Remaining lungs clear. No pneumothorax or acute osseous findings. IMPRESSION: Enlargement of cardiac silhouette with pulmonary vascular congestion. Minimal RIGHT basilar atelectasis. LEFT lower lobe atelectasis versus consolidation with small LEFT pleural effusion. Electronically Signed   By: Ulyses SouthwardMark  Boles M.D.   On: 06/17/2021 18:21   CT Angio Chest PE W and/or Wo Contrast  Result Date: 06/17/2021 EXAM: CT ANGIOGRAPHY CHEST WITH CONTRAST TECHNIQUE: Multidetector CT imaging of the chest was performed using the standard protocol during bolus administration of intravenous contrast. Multiplanar CT image reconstructions and MIPs were obtained to evaluate the vascular anatomy. CONTRAST:  100mL OMNIPAQUE IOHEXOL 350 MG/ML SOLN COMPARISON:  None. FINDINGS: Cardiovascular: Fairly satisfactory opacification of the pulmonary arteries to the segmental level. Limited evaluation of the subsegmental level due to timing of contrast. No evidence of pulmonary embolism. The main pulmonary artery is normal in caliber. Enlarged left ventricle. No significant pericardial effusion. The thoracic aorta is normal in caliber. No atherosclerotic plaque of the thoracic aorta. No coronary artery calcifications. Mediastinum/Nodes: No enlarged mediastinal, hilar, or axillary lymph nodes. Thyroid gland, trachea, and esophagus demonstrate no significant findings. Lungs/Pleura: Paraseptal emphysematous changes. No focal consolidation. No pulmonary nodule. No  pulmonary mass. Trace bilateral pleural effusions, left greater than right. No pneumothorax. Upper Abdomen: No acute abnormality. Musculoskeletal: No chest wall abnormality. No acute or significant osseous findings. Review of the MIP images confirms the above findings. IMPRESSION: 1. No central or segmental pulmonary embolus. Limited evaluation of the subsegmental level. 2. Trace bilateral pleural effusions. 3. Cardiomegaly. 4.  Emphysema (ICD10-J43.9). Electronically Signed   By: Tish FredericksonMorgane  Naveau M.D.   On: 06/17/2021 22:01   ECHOCARDIOGRAM COMPLETE  Result Date: 06/18/2021    ECHOCARDIOGRAM REPORT   Patient Name:   Mario Beck Date of Exam: 06/18/2021 Medical Rec #:  [id number]     Height:       75.0 in Accession #:    [id number]    Weight:       182.0 lb Date of Birth:  10/19/1980     BSA:          2.108 m Patient Age:    40 years      BP:           122/103 mmHg Patient Gender: M             HR:           97 bpm. Exam Location:  [facility] Procedure: 2D Echo, Cardiac Doppler and Color Doppler Indications:     CHF-acute systolic I50.21  History:         Patient has prior history of Echocardiogram examinations, most                  recent 09/16/2020. Risk Factors:Hypertension.  Sonographer:     Cristela BlueJerry Hege Referring Phys:  [id number] ANASTASSIA DOUTOVA Diagnosing Phys: Lorine BearsMuhammad [name] MD IMPRESSIONS  1. Left ventricular ejection fraction, by estimation, is <20%. The left ventricle has severely decreased function. The left ventricle demonstrates global hypokinesis. The left ventricular internal cavity size was moderately to severely dilated. There is  mild left ventricular  hypertrophy. Left ventricular diastolic parameters are consistent with Grade II diastolic dysfunction (pseudonormalization).  2. Right ventricular systolic function is moderately reduced. The right ventricular size is mildly enlarged. There is normal pulmonary artery systolic pressure.  3. Left atrial size was severely dilated.  4. Right atrial size was  moderately dilated.  5. The mitral valve is normal in structure. Moderate to severe mitral valve regurgitation. No evidence of mitral stenosis.  6. Tricuspid valve regurgitation is moderate.  7. The aortic valve is normal in structure. Aortic valve regurgitation is not visualized. No aortic stenosis is present.  8. The inferior vena cava is normal in size with greater than 50% respiratory variability, suggesting right atrial pressure of 3 mmHg. FINDINGS  Left Ventricle: Left ventricular ejection fraction, by estimation, is <20%. The left ventricle has severely decreased function. The left ventricle demonstrates global hypokinesis. The left ventricular internal cavity size was moderately to severely dilated. There is mild left ventricular hypertrophy. Left ventricular diastolic parameters are consistent with Grade II diastolic dysfunction (pseudonormalization). Right Ventricle: The right ventricular size is mildly enlarged. No increase in right ventricular wall thickness. Right ventricular systolic function is moderately reduced. There is normal pulmonary artery systolic pressure. The tricuspid regurgitant velocity is 2.38 m/s, and with an assumed right atrial pressure of 3 mmHg, the estimated right ventricular systolic pressure is 25.7 mmHg. Left Atrium: Left atrial size was severely dilated. Right Atrium: Right atrial size was moderately dilated. Pericardium: There is no evidence of pericardial effusion. Mitral Valve: The mitral valve is normal in structure. Moderate to severe mitral valve regurgitation. No evidence of mitral valve stenosis. Tricuspid Valve: The tricuspid valve is normal in structure. Tricuspid valve regurgitation is moderate . No evidence of tricuspid stenosis. Aortic Valve: The aortic valve is normal in structure. Aortic valve regurgitation is not visualized. No aortic stenosis is present. Aortic valve mean gradient measures 1.3 mmHg. Aortic valve peak gradient measures 2.6 mmHg. Aortic valve  area, by VTI measures 1.69 cm. Pulmonic Valve: The pulmonic valve was normal in structure. Pulmonic valve regurgitation is mild. No evidence of pulmonic stenosis. Aorta: The aortic root is normal in size and structure. Venous: The inferior vena cava is normal in size with greater than 50% respiratory variability, suggesting right atrial pressure of 3 mmHg. IAS/Shunts: No atrial level shunt detected by color flow Doppler.  LEFT VENTRICLE PLAX 2D LVIDd:         6.90 cm      Diastology LVIDs:         6.40 cm      LV e' medial:    5.11 cm/s LV PW:         1.30 cm      LV E/e' medial:  18.3 LV IVS:        1.10 cm      LV e' lateral:   6.96 cm/s LVOT diam:     2.30 cm      LV E/e' lateral: 13.4 LV SV:         16 LV SV Index:   8 LVOT Area:     4.15 cm  LV Volumes (MOD) LV vol d, MOD A2C: 308.0 ml LV vol d, MOD A4C: 299.0 ml LV vol s, MOD A2C: 257.0 ml LV vol s, MOD A4C: 238.0 ml LV SV MOD A2C:     51.0 ml LV SV MOD A4C:     299.0 ml LV SV MOD BP:      47.9 ml RIGHT VENTRICLE  RV Basal diam:  5.40 cm RV S prime:     8.05 cm/s TAPSE (M-mode): 5.3 cm LEFT ATRIUM              Index       RIGHT ATRIUM           Index LA diam:        5.20 cm  2.47 cm/m  RA Area:     25.50 cm LA Vol (A2C):   133.0 ml 63.09 ml/m RA Volume:   73.00 ml  34.63 ml/m LA Vol (A4C):   127.0 ml 60.24 ml/m LA Biplane Vol: 131.0 ml 62.14 ml/m  AORTIC VALVE                   PULMONIC VALVE AV Area (Vmax):    1.89 cm    PV Vmax:        0.37 m/s AV Area (Vmean):   1.65 cm    PV Peak grad:   0.6 mmHg AV Area (VTI):     1.69 cm    RVOT Peak grad: 1 mmHg AV Vmax:           80.33 cm/s AV Vmean:          53.300 cm/s AV VTI:            0.098 m AV Peak Grad:      2.6 mmHg AV Mean Grad:      1.3 mmHg LVOT Vmax:         36.60 cm/s LVOT Vmean:        21.200 cm/s LVOT VTI:          0.040 m LVOT/AV VTI ratio: 0.41  AORTA Ao Root diam: 3.00 cm MITRAL VALVE               TRICUSPID VALVE MV Area (PHT): 6.02 cm    TR Peak grad:   22.7 mmHg MV Decel Time: 126 msec     TR Vmax:        238.00 cm/s MV E velocity: 93.40 cm/s                            SHUNTS                            Systemic VTI:  0.04 m                            Systemic Diam: 2.30 cm [name] MD Electronically signed by [name] MD Signature Date/Time: 06/18/2021/3:51:01 PM    Final      ASSESSMENT AND PLAN: Congestive heart failure with severe LV dysfunction and left ventricular ejection fraction 20%.  We will start the patient on Entresto and increase the dose of metoprolol succinate 200 mg and add Aldactone.  Cassondra Stachowski A

## 2021-06-20 LAB — CBC WITH DIFFERENTIAL/PLATELET
Abs Immature Granulocytes: 0.08 10*3/uL — ABNORMAL HIGH (ref 0.00–0.07)
Basophils Absolute: 0 10*3/uL (ref 0.0–0.1)
Basophils Relative: 0 %
Eosinophils Absolute: 0 10*3/uL (ref 0.0–0.5)
Eosinophils Relative: 0 %
HCT: 32.5 % — ABNORMAL LOW (ref 39.0–52.0)
Hemoglobin: 11.3 g/dL — ABNORMAL LOW (ref 13.0–17.0)
Immature Granulocytes: 1 %
Lymphocytes Relative: 34 %
Lymphs Abs: 2.7 10*3/uL (ref 0.7–4.0)
MCH: 29.7 pg (ref 26.0–34.0)
MCHC: 34.8 g/dL (ref 30.0–36.0)
MCV: 85.5 fL (ref 80.0–100.0)
Monocytes Absolute: 0.8 10*3/uL (ref 0.1–1.0)
Monocytes Relative: 10 %
Neutro Abs: 4.3 10*3/uL (ref 1.7–7.7)
Neutrophils Relative %: 55 %
Platelets: 230 10*3/uL (ref 150–400)
RBC: 3.8 MIL/uL — ABNORMAL LOW (ref 4.22–5.81)
RDW: 14.6 % (ref 11.5–15.5)
WBC: 7.9 10*3/uL (ref 4.0–10.5)
nRBC: 0.3 % — ABNORMAL HIGH (ref 0.0–0.2)

## 2021-06-20 LAB — C-REACTIVE PROTEIN: CRP: 0.6 mg/dL (ref <1.0)

## 2021-06-20 LAB — D-DIMER, QUANTITATIVE: D-Dimer, Quant: 1.02 ug/mL-FEU — ABNORMAL HIGH (ref 0.00–0.50)

## 2021-06-20 LAB — BASIC METABOLIC PANEL
Anion gap: 11 (ref 5–15)
BUN: 22 mg/dL — ABNORMAL HIGH (ref 6–20)
CO2: 22 mmol/L (ref 22–32)
Calcium: 9.1 mg/dL (ref 8.9–10.3)
Chloride: 104 mmol/L (ref 98–111)
Creatinine, Ser: 1.16 mg/dL (ref 0.61–1.24)
GFR, Estimated: >60 mL/min (ref >60)
Glucose, Bld: 126 mg/dL — ABNORMAL HIGH (ref 70–99)
Potassium: 3.8 mmol/L (ref 3.5–5.1)
Sodium: 137 mmol/L (ref 135–145)

## 2021-06-20 MED ORDER — FUROSEMIDE 10 MG/ML IJ SOLN
40.0000 mg | Freq: Two times a day (BID) | INTRAMUSCULAR | Status: DC
  Administered 2021-06-20 (×2): 40 mg via INTRAVENOUS
  Filled 2021-06-20 (×3): qty 4

## 2021-06-20 MED ORDER — DAPAGLIFLOZIN PROPANEDIOL 5 MG PO TABS
10.0000 mg | ORAL_TABLET | Freq: Every day | ORAL | Status: DC
  Administered 2021-06-20 – 2021-06-21 (×2): 10 mg via ORAL
  Filled 2021-06-20 (×2): qty 2

## 2021-06-20 NOTE — Progress Notes (Signed)
Patient requested to speak with nurse concerning his breathing. Patient told nurse that every time he falls asleep he cant' breath good and the continuous pulse ox in room begins alarming, this was not noted on tele monitor by nurse nor was call received from tele. Nurse asked patient if he had ever been tested for sleep apnea and he stated "no". Nurse assured patient that she would monitor the tele screen to see when and if he is dropping O2 sats and if he is then we will try a little oxygen. Patient told nurse that previous nighshift nurse tried that on 06/18/21 and it was not effective. Will continue to monitor and notify provider.

## 2021-06-20 NOTE — Progress Notes (Signed)
SUBJECTIVE: Still short of breath.   Vitals:   06/19/21 1711 06/19/21 2026 06/20/21 0430 06/20/21 0811  BP: (!) 120/92 118/88 95/73 103/73  Pulse: 87 94 92 93  Resp:  18 16 18   Temp:  98.3 F (36.8 C) 98.3 F (36.8 C) 98.3 F (36.8 C)  TempSrc:   Oral   SpO2:  95% 98% 97%  Weight:      Height:        Intake/Output Summary (Last 24 hours) at 06/20/2021 0935 Last data filed at 06/20/2021 0534 Gross per 24 hour  Intake --  Output 750 ml  Net -750 ml    LABS: Basic Metabolic Panel: Recent Labs    06/17/21 1406 06/18/21 0153 06/19/21 0427 06/20/21 0411  NA 136 137 139 137  K 3.1* 3.4* 3.1* 3.8  CL 100 102 100 104  CO2 24 25 27 22   GLUCOSE 120* 118* 119* 126*  BUN 21* 21* 19 22*  CREATININE 1.17 1.07 1.02 1.16  CALCIUM 8.7* 8.4* 8.9 9.1  MG 1.7 1.9  --   --   PHOS  --  3.7  --   --    Liver Function Tests: Recent Labs    06/17/21 1406 06/18/21 0153  AST 50* 36  ALT 35 29  ALKPHOS 172* 149*  BILITOT 1.0 0.8  PROT 6.8 6.2*  ALBUMIN 3.5 3.3*   Recent Labs    06/17/21 1406  LIPASE 18   CBC: Recent Labs    06/19/21 0427 06/20/21 0411  WBC 6.2 7.9  NEUTROABS 3.4 4.3  HGB 11.8* 11.3*  HCT 33.2* 32.5*  MCV 85.3 85.5  PLT 218 230   Cardiac Enzymes: No results for input(s): CKTOTAL, CKMB, CKMBINDEX, TROPONINI in the last 72 hours. BNP: Invalid input(s): POCBNP D-Dimer: Recent Labs    06/19/21 0427 06/20/21 0411  DDIMER 1.20* 1.02*   Hemoglobin A1C: No results for input(s): HGBA1C in the last 72 hours. Fasting Lipid Panel: No results for input(s): CHOL, HDL, LDLCALC, TRIG, CHOLHDL, LDLDIRECT in the last 72 hours. Thyroid Function Tests: Recent Labs    06/18/21 0153  TSH 1.402   Anemia Panel: Recent Labs    06/18/21 0153  FERRITIN 34     PHYSICAL EXAM General: Well developed, well nourished, in no acute distress HEENT:  Normocephalic and atramatic Neck:  No JVD.  Lungs: Clear bilaterally to auscultation and percussion. Heart: HRRR  . Normal S1 and S2 without gallops or murmurs.  Abdomen: Bowel sounds are positive, abdomen soft and non-tender  Msk:  Back normal, normal gait. Normal strength and tone for age. Extremities: No clubbing, cyanosis or edema.   Neuro: Alert and oriented X 3. Psych:  Good affect, responds appropriately  TELEMETRY: Sinus rhythm  ASSESSMENT AND PLAN: Congestive heart failure with left ventricular ejection fraction 20%.  Patient apparently had angioedema due to ACE inhibitor's and after 08/18/21 was started it had to be stopped because of possible interaction.  Started the patient on BiDil and metoprolol succinate 100 along with Aldactone.  We will add 08/18/21.  Patient is still short of breath.  Active Problems:   HTN (hypertension), malignant   Tobacco abuse   GERD (gastroesophageal reflux disease)   Elevated troponin   Acute on chronic systolic CHF (congestive heart failure) (HCC)   Acute on chronic combined systolic and diastolic CHF (congestive heart failure) (HCC)   Hypokalemia   COVID-19 virus infection   Hypomagnesemia   CHF (congestive heart failure) (HCC)    Ceola Para A,  MD, [facility] 06/20/2021 9:35 AM

## 2021-06-20 NOTE — Plan of Care (Signed)

## 2021-06-20 NOTE — Progress Notes (Signed)
[facility] PROGRESS NOTE    Mario Beck  KZS:[id number] DOB: September 28, 1981 DOA: 06/17/2021 PCP: Pcp, No      Brief Narrative:  Mario Beck is a 40 y.o. 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  Actively drinking, last drink 24 hours prior to admission.  Vaccinated 1 dose.  COVID-positive in the ER.  Chest imaging ruled out PE, shows no airspace disease, only effusions, BNP >3000.  K 3.1.        Assessment & Plan:  Acute on chronic systolic and diastolic CHF Alcoholic cardiomyopathy Lsix reduced yesterday, only net out 700cc, 5.0L on admission.  Still feels swollen and short of breath.   Creatinine and K normal   Echocardiogram showed EF now reduced less than 20%.  -Consult cardiology - Continue furosemide 40 IV twice daily -Continue K - Strict ins and outs, daily weights, telemetry - Daily BMP - Continue BiDil, spiro, metoprolol   -Strict alcohol cessation was recommended   Incidental COVID Mild fatigue, mild cough are only symptoms.  - Continue paxlovid -Continue cough suppressants   Recurrent alcoholic pancreatitis Lipase normal, no epigastric pain  Alcohol use disorder No evidence of alcohol withdrawal delirium.  CIWAs mild.  - Continue thiamine and folate - Continue CIWA scoring with on-demand lorazepam    Asthma, mild intermittent No active disease - Albuterol as needed  Hypokalemia Repleted  Hypomagnesemia Magnesium supplemented              Disposition: Status is: Inpatient  Patient still swollen, still qrequires ongonign IV diuresis.  Dispo: The patient is from: Home              Anticipated d/c is to: Home              Patient currently is not medically stable to d/c.   Difficult to place patient No   Level of care: Med-Surg    Patient  was admitted with congestive heart failure symptoms, found to have a EF reduced down to 20%.  Cardiology consulted, he is still symptomatic we will continue Lasix       MDM: The below labs and imaging reports were reviewed and summarized above.  Medication management as above.     DVT prophylaxis: enoxaparin (LOVENOX) injection 40 mg Start: 06/17/21 2300  Code Status: full Family Communication: Fiance at the bedside    Consultants:  Cardiology  Procedures:  9/2 echocardiogram: EF less than 20%, no significant valvular disease  Antimicrobials:     Culture data:             Subjective: Orthopnea and shortness of breath slightly better, but still significant.  Cough with some post tussive chest pain.  No exertional symptoms, no fever, chills, runny nose    Objective: Vitals:   06/19/21 1711 06/19/21 2026 06/20/21 0430 06/20/21 0811  BP: (!) 120/92 118/88 95/73 103/73  Pulse: 87 94 92 93  Resp:  18 16 18   Temp:  98.3 F (36.8 C) 98.3 F (36.8 C) 98.3 F (36.8 C)  TempSrc:   Oral   SpO2:  95% 98% 97%  Weight:      Height:        Intake/Output Summary (Last 24 hours) at 06/20/2021 1353 Last data filed at 06/20/2021 0534 Gross per 24 hour  Intake --  Output 200 ml  Net -200 ml   Filed Weights   06/17/21 1351  Weight: 82.6 kg    Examination: General appearance: Adult male, lying in bed, no acute distress     HEENT: Anicteric, conjunctive are pink, lids and lashes normal.  No nasal deformity, discharge, or epistaxis. Skin: No suspicious rashes or lesions, skin warm and dry Cardiac: RRR, no murmurs, no lower extremity edema, JVP normal, abdomen feels "full " Respiratory: Respiratory rate and rhythm, lungs clear without rales or wheezes Abdomen: Abdomen soft without tenderness palpation or guarding, no ascites or distention MSK: Normal muscle bulk and tone Neuro: Awake and alert, extraocular movements intact, moves all extremities with normal strength  and coordination, speech fluent Psych: Attention normal, affect blunted, judgment insight appear normal     Data Reviewed: I have personally reviewed following labs and imaging studies:  CBC: Recent Labs  Lab 06/17/21 1406 06/18/21 0153 06/19/21 0427 06/20/21 0411  WBC 6.7 6.3 6.2 7.9  NEUTROABS  --  3.3 3.4 4.3  HGB 12.3* 11.8* 11.8* 11.3*  HCT 34.8* 33.6* 33.2* 32.5*  MCV 85.1 85.5 85.3 85.5  PLT 201 212 218 230   Basic Metabolic Panel: Recent Labs  Lab 06/17/21 1406 06/18/21 0153 06/19/21 0427 06/20/21 0411  NA 136 137 139 137  K 3.1* 3.4* 3.1* 3.8  CL 100 102 100 104  CO2 24 25 27 22   GLUCOSE 120* 118* 119* 126*  BUN 21* 21* 19 22*  CREATININE 1.17 1.07 1.02 1.16  CALCIUM 8.7* 8.4* 8.9 9.1  MG 1.7 1.9  --   --   PHOS  --  3.7  --   --    GFR: Estimated Creatinine Clearance: 100.9 mL/min (by C-G formula based on SCr of 1.16 mg/dL). Liver Function Tests: Recent Labs  Lab 06/17/21 1406 06/18/21 0153  AST 50* 36  ALT 35 29  ALKPHOS 172* 149*  BILITOT 1.0 0.8  PROT 6.8 6.2*  ALBUMIN 3.5 3.3*   Recent Labs  Lab 06/17/21 1406  LIPASE 18   No results for input(s): AMMONIA in the last 168 hours. Coagulation Profile: Recent Labs  Lab 06/18/21 0153  INR 1.2   Cardiac Enzymes: No results for input(s): CKTOTAL, CKMB, CKMBINDEX, TROPONINI in the last 168 hours. BNP (last 3 results) No results for input(s): PROBNP in the last 8760 hours. HbA1C: No results for input(s): HGBA1C in the last 72 hours. CBG: No results for input(s): GLUCAP in the last 168 hours. Lipid Profile: No results for input(s): CHOL, HDL, LDLCALC, TRIG, CHOLHDL, LDLDIRECT in the last 72 hours. Thyroid Function Tests: Recent Labs    06/18/21 0153  TSH 1.402   Anemia Panel: Recent Labs    06/18/21 0153  FERRITIN 34   Urine analysis:    Component Value Date/Time   COLORURINE YELLOW (A) 05/27/2021 2244   APPEARANCEUR HAZY (A) 05/27/2021 2244   LABSPEC 1.018 05/27/2021 2244    PHURINE 5.0 05/27/2021 2244   GLUCOSEU 50 (A) 05/27/2021 2244   HGBUR SMALL (A) 05/27/2021 2244   BILIRUBINUR NEGATIVE 05/27/2021 2244   KETONESUR NEGATIVE 05/27/2021 2244   PROTEINUR 100 (A) 05/27/2021 2244   NITRITE NEGATIVE 05/27/2021 2244   LEUKOCYTESUR TRACE (A) 05/27/2021 2244   Sepsis Labs: @LABRCNTIP (procalcitonin:4,lacticacidven:4)  ) Recent Results (from the past 240 hour(s))  Resp Panel by RT-PCR (Flu A&B, Covid) Nasopharyngeal Swab     Status: Abnormal   Collection Time: 06/17/21  5:18 PM   Specimen: Nasopharyngeal Swab; Nasopharyngeal(NP) swabs in vial transport medium  Result  Value Ref Range Status   SARS Coronavirus 2 by RT PCR POSITIVE (A) NEGATIVE Final    Comment: RESULT CALLED TO, READ BACK BY AND VERIFIED WITH: JENNIFER GREGORY @1829  06/17/21 MJU (NOTE) SARS-CoV-2 target nucleic acids are DETECTED.  The SARS-CoV-2 RNA is generally detectable in upper respiratory specimens during the acute phase of infection. Positive results are indicative of the presence of the identified virus, but do not rule out bacterial infection or co-infection with other pathogens not detected by the test. Clinical correlation with patient history and other diagnostic information is necessary to determine patient infection status. The expected result is Negative.  Fact Sheet for Patients: BloggerCourse.com  Fact Sheet for Healthcare Providers: SeriousBroker.it  This test is not yet approved or cleared by the [place] FDA and  has been authorized for detection and/or diagnosis of SARS-CoV-2 by FDA under an Emergency Use Authorization (EUA).  This EUA will remain in effect (meaning this test can be  used) for the duration of  the COVID-19 declaration under Section 564(b)(1) of the Act, 21 U.S.C. section 360bbb-3(b)(1), unless the authorization is terminated or revoked sooner.     Influenza A by PCR NEGATIVE NEGATIVE Final    Influenza B by PCR NEGATIVE NEGATIVE Final    Comment: (NOTE) The Xpert Xpress SARS-CoV-2/FLU/RSV plus assay is intended as an aid in the diagnosis of influenza from Nasopharyngeal swab specimens and should not be used as a sole basis for treatment. Nasal washings and aspirates are unacceptable for Xpert Xpress SARS-CoV-2/FLU/RSV testing.  Fact Sheet for Patients: BloggerCourse.com  Fact Sheet for Healthcare Providers: SeriousBroker.it  This test is not yet approved or cleared by the [place] FDA and has been authorized for detection and/or diagnosis of SARS-CoV-2 by FDA under an Emergency Use Authorization (EUA). This EUA will remain in effect (meaning this test can be used) for the duration of the COVID-19 declaration under Section 564(b)(1) of the Act, 21 U.S.C. section 360bbb-3(b)(1), unless the authorization is terminated or revoked.  Performed at [facility], [address] [place], [place] [address]          Radiology Studies: ECHOCARDIOGRAM COMPLETE  Result Date: 06/18/2021    ECHOCARDIOGRAM REPORT   Patient Name:   Mario Beck Date of Exam: 06/18/2021 Medical Rec #:  [id number]     Height:       75.0 in Accession #:    [id number]    Weight:       182.0 lb Date of Birth:  July 01, 1981     BSA:          2.108 m Patient Age:    40 years      BP:           122/103 mmHg Patient Gender: M             HR:           97 bpm. Exam Location:  [facility] Procedure: 2D Echo, Cardiac Doppler and Color Doppler Indications:     CHF-acute systolic I50.21  History:         Patient has prior history of Echocardiogram examinations, most                  recent 09/16/2020. Risk Factors:Hypertension.  Sonographer:     Cristela Blue Referring Phys:  Kenn File DOUTOVA Diagnosing Phys: [name] MD IMPRESSIONS  1. Left ventricular ejection fraction, by estimation, is <20%. The left ventricle has severely decreased function. The left  ventricle  demonstrates global hypokinesis. The left ventricular internal cavity size was moderately to severely dilated. There is  mild left ventricular hypertrophy. Left ventricular diastolic parameters are consistent with Grade II diastolic dysfunction (pseudonormalization).  2. Right ventricular systolic function is moderately reduced. The right ventricular size is mildly enlarged. There is normal pulmonary artery systolic pressure.  3. Left atrial size was severely dilated.  4. Right atrial size was moderately dilated.  5. The mitral valve is normal in structure. Moderate to severe mitral valve regurgitation. No evidence of mitral stenosis.  6. Tricuspid valve regurgitation is moderate.  7. The aortic valve is normal in structure. Aortic valve regurgitation is not visualized. No aortic stenosis is present.  8. The inferior vena cava is normal in size with greater than 50% respiratory variability, suggesting right atrial pressure of 3 mmHg. FINDINGS  Left Ventricle: Left ventricular ejection fraction, by estimation, is <20%. The left ventricle has severely decreased function. The left ventricle demonstrates global hypokinesis. The left ventricular internal cavity size was moderately to severely dilated. There is mild left ventricular hypertrophy. Left ventricular diastolic parameters are consistent with Grade II diastolic dysfunction (pseudonormalization). Right Ventricle: The right ventricular size is mildly enlarged. No increase in right ventricular wall thickness. Right ventricular systolic function is moderately reduced. There is normal pulmonary artery systolic pressure. The tricuspid regurgitant velocity is 2.38 m/s, and with an assumed right atrial pressure of 3 mmHg, the estimated right ventricular systolic pressure is 25.7 mmHg. Left Atrium: Left atrial size was severely dilated. Right Atrium: Right atrial size was moderately dilated. Pericardium: There is no evidence of pericardial effusion. Mitral  Valve: The mitral valve is normal in structure. Moderate to severe mitral valve regurgitation. No evidence of mitral valve stenosis. Tricuspid Valve: The tricuspid valve is normal in structure. Tricuspid valve regurgitation is moderate . No evidence of tricuspid stenosis. Aortic Valve: The aortic valve is normal in structure. Aortic valve regurgitation is not visualized. No aortic stenosis is present. Aortic valve mean gradient measures 1.3 mmHg. Aortic valve peak gradient measures 2.6 mmHg. Aortic valve area, by VTI measures 1.69 cm. Pulmonic Valve: The pulmonic valve was normal in structure. Pulmonic valve regurgitation is mild. No evidence of pulmonic stenosis. Aorta: The aortic root is normal in size and structure. Venous: The inferior vena cava is normal in size with greater than 50% respiratory variability, suggesting right atrial pressure of 3 mmHg. IAS/Shunts: No atrial level shunt detected by color flow Doppler.  LEFT VENTRICLE PLAX 2D LVIDd:         6.90 cm      Diastology LVIDs:         6.40 cm      LV e' medial:    5.11 cm/s LV PW:         1.30 cm      LV E/e' medial:  18.3 LV IVS:        1.10 cm      LV e' lateral:   6.96 cm/s LVOT diam:     2.30 cm      LV E/e' lateral: 13.4 LV SV:         16 LV SV Index:   8 LVOT Area:     4.15 cm  LV Volumes (MOD) LV vol d, MOD A2C: 308.0 ml LV vol d, MOD A4C: 299.0 ml LV vol s, MOD A2C: 257.0 ml LV vol s, MOD A4C: 238.0 ml LV SV MOD A2C:     51.0 ml LV SV MOD A4C:  299.0 ml LV SV MOD BP:      47.9 ml RIGHT VENTRICLE RV Basal diam:  5.40 cm RV S prime:     8.05 cm/s TAPSE (M-mode): 5.3 cm LEFT ATRIUM              Index       RIGHT ATRIUM           Index LA diam:        5.20 cm  2.47 cm/m  RA Area:     25.50 cm LA Vol (A2C):   133.0 ml 63.09 ml/m RA Volume:   73.00 ml  34.63 ml/m LA Vol (A4C):   127.0 ml 60.24 ml/m LA Biplane Vol: 131.0 ml 62.14 ml/m  AORTIC VALVE                   PULMONIC VALVE AV Area (Vmax):    1.89 cm    PV Vmax:        0.37 m/s AV  Area (Vmean):   1.65 cm    PV Peak grad:   0.6 mmHg AV Area (VTI):     1.69 cm    RVOT Peak grad: 1 mmHg AV Vmax:           80.33 cm/s AV Vmean:          53.300 cm/s AV VTI:            0.098 m AV Peak Grad:      2.6 mmHg AV Mean Grad:      1.3 mmHg LVOT Vmax:         36.60 cm/s LVOT Vmean:        21.200 cm/s LVOT VTI:          0.040 m LVOT/AV VTI ratio: 0.41  AORTA Ao Root diam: 3.00 cm MITRAL VALVE               TRICUSPID VALVE MV Area (PHT): 6.02 cm    TR Peak grad:   22.7 mmHg MV Decel Time: 126 msec    TR Vmax:        238.00 cm/s MV E velocity: 93.40 cm/s                            SHUNTS                            Systemic VTI:  0.04 m                            Systemic Diam: 2.30 cm Lorine BearsMuhammad [name] MD Electronically signed by Lorine BearsMuhammad [name] MD Signature Date/Time: 06/18/2021/3:51:01 PM    Final         Scheduled Meds:  albuterol  2 puff Inhalation Q6H   dapagliflozin propanediol  10 mg Oral Daily   enoxaparin (LOVENOX) injection  40 mg Subcutaneous Q24H   feeding supplement  1 Container Oral TID BM   folic acid  1 mg Oral Daily   furosemide  40 mg Intravenous BID   isosorbide-hydrALAZINE  1 tablet Oral TID   metoprolol succinate  100 mg Oral Daily   multivitamin with minerals  1 tablet Oral Daily   nicotine  21 mg Transdermal Daily   nirmatrelvir/ritonavir EUA  3 tablet Oral BID   potassium chloride  40 mEq Oral BID   sodium chloride  flush  3 mL Intravenous Q12H   spironolactone  12.5 mg Oral Daily   thiamine  100 mg Oral Daily   Or   thiamine  100 mg Intravenous Daily   Continuous Infusions:  sodium chloride       LOS: 2 days    Time spent: 25 minutes    Alberteen Sam, MD [facility] 06/20/2021, 1:53 PM     Please page though AMION or Epic secure chat:  For [employer], Higher education careers adviser

## 2021-06-21 LAB — BASIC METABOLIC PANEL
Anion gap: 10 (ref 5–15)
BUN: 26 mg/dL — ABNORMAL HIGH (ref 6–20)
CO2: 23 mmol/L (ref 22–32)
Calcium: 9.3 mg/dL (ref 8.9–10.3)
Chloride: 104 mmol/L (ref 98–111)
Creatinine, Ser: 1.38 mg/dL — ABNORMAL HIGH (ref 0.61–1.24)
GFR, Estimated: >60 mL/min (ref >60)
Glucose, Bld: 110 mg/dL — ABNORMAL HIGH (ref 70–99)
Potassium: 4.1 mmol/L (ref 3.5–5.1)
Sodium: 137 mmol/L (ref 135–145)

## 2021-06-21 LAB — CBC WITH DIFFERENTIAL/PLATELET
Abs Immature Granulocytes: 0.02 10*3/uL (ref 0.00–0.07)
Basophils Absolute: 0.1 10*3/uL (ref 0.0–0.1)
Basophils Relative: 1 %
Eosinophils Absolute: 0.1 10*3/uL (ref 0.0–0.5)
Eosinophils Relative: 1 %
HCT: 32.5 % — ABNORMAL LOW (ref 39.0–52.0)
Hemoglobin: 11.3 g/dL — ABNORMAL LOW (ref 13.0–17.0)
Immature Granulocytes: 0 %
Lymphocytes Relative: 40 %
Lymphs Abs: 2.9 10*3/uL (ref 0.7–4.0)
MCH: 29 pg (ref 26.0–34.0)
MCHC: 34.8 g/dL (ref 30.0–36.0)
MCV: 83.5 fL (ref 80.0–100.0)
Monocytes Absolute: 0.7 10*3/uL (ref 0.1–1.0)
Monocytes Relative: 9 %
Neutro Abs: 3.6 10*3/uL (ref 1.7–7.7)
Neutrophils Relative %: 49 %
Platelets: 247 10*3/uL (ref 150–400)
RBC: 3.89 MIL/uL — ABNORMAL LOW (ref 4.22–5.81)
RDW: 14.8 % (ref 11.5–15.5)
WBC: 7.3 10*3/uL (ref 4.0–10.5)
nRBC: 0.4 % — ABNORMAL HIGH (ref 0.0–0.2)

## 2021-06-21 LAB — C-REACTIVE PROTEIN: CRP: 1.1 mg/dL — ABNORMAL HIGH (ref <1.0)

## 2021-06-21 MED ORDER — NIRMATRELVIR/RITONAVIR (PAXLOVID)TABLET: 3.0000 | ORAL_TABLET | Freq: Two times a day (BID) | ORAL | 0 refills | Status: AC

## 2021-06-21 MED ORDER — FUROSEMIDE 40 MG PO TABS: 40.0000 mg | ORAL_TABLET | Freq: Every day | ORAL | 0 refills | Status: DC

## 2021-06-21 MED ORDER — FUROSEMIDE 40 MG PO TABS
40.0000 mg | ORAL_TABLET | Freq: Every day | ORAL | 11 refills | Status: DC
  Filled 2021-06-21: qty 30, 30d supply, fill #0
  Filled 2021-07-26: qty 30, 30d supply, fill #1
  Filled 2021-07-26: qty 14, 14d supply, fill #1

## 2021-06-21 MED ORDER — METOPROLOL SUCCINATE ER 100 MG PO TB24: 100.0000 mg | ORAL_TABLET | Freq: Every day | ORAL | 0 refills | Status: DC

## 2021-06-21 MED ORDER — BENZONATATE 100 MG PO CAPS
200.0000 mg | ORAL_CAPSULE | Freq: Three times a day (TID) | ORAL | 0 refills | Status: DC | PRN
  Filled 2021-06-21: qty 60, 10d supply, fill #0

## 2021-06-21 MED ORDER — ISOSORBIDE DINITRATE 30 MG PO TABS
30.0000 mg | ORAL_TABLET | Freq: Three times a day (TID) | ORAL | 0 refills | Status: DC
Start: 2021-06-21 — End: 2022-01-18

## 2021-06-21 MED ORDER — POTASSIUM CHLORIDE CRYS ER 20 MEQ PO TBCR
40.0000 meq | EXTENDED_RELEASE_TABLET | Freq: Every day | ORAL | 3 refills | Status: DC
  Filled 2021-06-21: qty 60, 30d supply, fill #0

## 2021-06-21 MED ORDER — ISOSORBIDE DINITRATE 30 MG PO TABS
30.0000 mg | ORAL_TABLET | Freq: Three times a day (TID) | ORAL | 3 refills | Status: DC
  Filled 2021-06-21: qty 90, 30d supply, fill #0

## 2021-06-21 MED ORDER — METOPROLOL SUCCINATE ER 100 MG PO TB24
100.0000 mg | ORAL_TABLET | Freq: Every day | ORAL | 3 refills | Status: DC
  Filled 2021-06-21: qty 30, 30d supply, fill #0

## 2021-06-21 MED ORDER — HYDRALAZINE HCL 25 MG PO TABS: 25.0000 mg | ORAL_TABLET | Freq: Three times a day (TID) | ORAL | 0 refills | Status: DC

## 2021-06-21 MED ORDER — HYDRALAZINE HCL 25 MG PO TABS
25.0000 mg | ORAL_TABLET | Freq: Three times a day (TID) | ORAL | 3 refills | Status: DC
  Filled 2021-06-21: qty 90, 30d supply, fill #0

## 2021-06-21 MED ORDER — ONDANSETRON HCL 4 MG PO TABS: 4.0000 mg | ORAL_TABLET | Freq: Three times a day (TID) | ORAL | 0 refills | Status: DC | PRN

## 2021-06-21 MED ORDER — ALBUTEROL SULFATE HFA 108 (90 BASE) MCG/ACT IN AERS
2.0000 | INHALATION_SPRAY | Freq: Four times a day (QID) | RESPIRATORY_TRACT | 0 refills | Status: DC | PRN
Start: 2021-06-21 — End: 2022-01-28
  Filled 2021-06-21: qty 6.7, 25d supply, fill #0

## 2021-06-21 NOTE — Discharge Summary (Signed)
Physician Discharge Summary  Mario Beck ZOX:[id number] DOB: [birth year] DOA: 06/17/2021  PCP: Pcp, No  Admit date: 06/17/2021 Discharge date: 06/21/2021  Admitted From: Home  Disposition:  Home   Recommendations for Outpatient Follow-up:  Follow up with Dr. Welton Flakes in 1 week Dr. Welton Flakes: Please obtain BMP in one week     Home Health: None  Equipment/Devices: None new  Discharge Condition: Good  CODE STATUS: FULL Diet recommendation: Cardiac, low sodium  Brief/Interim Summary: Mario Beck is a 40 y.o. 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   Actively drinking, last drink 24 hours prior to admission.  Vaccinated 1 dose.  COVID-positive in the ER.  Chest imaging ruled out PE, shows no airspace disease, only effusions, BNP >3000.  K 3.1.     PRINCIPAL HOSPITAL DIAGNOSIS: Acute on chronic systolic CHF    Discharge Diagnoses:   Acute on chronic systolic and diastolic CHF Alcoholic cardiomyopathy Patient admitted with dyspnea on exertion, orthopnea, CXR showing bilateral effusions and BNP >3000.  Started on IV Lasix.    Echo obtained and showed EF <20%.    Cardiology were consulted.  They continued BB, added BiDil and spironolactone.    Patient with ACEi induced angioedema, and so Sherryll Burger was deferred.  Marcelline Deist caused vomiting and was held.        Incidental COVID Mild fatigue, mild cough are only symptoms. Treated with five days Paxlovid, given CHF.    Recurrent alcoholic pancreatitis Lipase normal, no epigastric pain   Alcohol use disorder No evidence of alcohol withdrawal delirium.  CIWAs mild.    Asthma, mild intermittent No active disease   Hypokalemia Repleted  Hypomagnesemia Magnesium supplemented                 Discharge Instructions  Discharge Instructions      (HEART FAILURE PATIENTS) Call MD:  Anytime you have any of the following symptoms: 1) 3 pound weight gain in 24 hours or 5 pounds in 1 week 2) shortness of breath, with or without a dry hacking cough 3) swelling in the hands, feet or stomach 4) if you have to sleep on extra pillows at night in order to breathe.   Complete by: As directed    Avoid straining   Complete by: As directed    Diet - low sodium heart healthy   Complete by: As directed    Discharge instructions   Complete by: As directed    You were admitted for congestive heart failure and COVID  For the COVID: Isolate for 10 days from your first day of cough or fever. Finish the prescription for Paxlovid (3 tabs twice daily) This is a five day course   For the heart failure: Weigh yourself daily As it says on here, measure your weight today when you get home, this is your "dry weight" If your weight is ever 5 lbs over your dry weight, call Dr. Welton Flakes   Take Lasix once daily Take isosorbide mononitrate and hydralazine three times a day (morning, noon and evening) Take the metoprolol once daily  Go see Dr. Welton Flakes Let him know the dapagliflozin made you nauseated and you didn't get a prescription   Heart Failure patients record your daily weight using the same scale at the same time of day   Complete by: As directed    Increase activity  slowly   Complete by: As directed    Increase activity slowly   Complete by: As directed    STOP any activity that causes chest pain, shortness of breath, dizziness, sweating, or exessive weakness   Complete by: As directed       Allergies as of 06/21/2021       Reactions   Lisinopril Swelling   Angioedema   Shellfish Allergy Swelling        Medication List     STOP taking these medications    LORazepam 1 MG tablet Commonly known as: ATIVAN   oxyCODONE-acetaminophen 5-325 MG tablet Commonly known as: PERCOCET/ROXICET   thiamine 100 MG tablet       TAKE these  medications    albuterol 108 (90 Base) MCG/ACT inhaler Commonly known as: VENTOLIN HFA Inhale 2 puffs into the lungs every 6 (six) hours as needed for wheezing or shortness of breath.   benzonatate 200 MG capsule Commonly known as: TESSALON Take 1 capsule (200 mg total) by mouth 3 (three) times daily as needed for cough.   furosemide 40 MG tablet Commonly known as: Lasix Take 1 tablet (40 mg total) by mouth daily for 2 days. What changed: how much to take   furosemide 40 MG tablet Commonly known as: Lasix Take 1 tablet (40 mg total) by mouth daily. What changed: You were already taking a medication with the same name, and this prescription was added. Make sure you understand how and when to take each.   hydrALAZINE 25 MG tablet Commonly known as: APRESOLINE Take 1 tablet (25 mg total) by mouth 3 (three) times daily for 2 days.   hydrALAZINE 25 MG tablet Commonly known as: APRESOLINE Take 1 tablet (25 mg total) by mouth 3 (three) times daily.   isosorbide dinitrate 30 MG tablet Commonly known as: ISORDIL Take 1 tablet (30 mg total) by mouth 3 (three) times daily.   isosorbide dinitrate 30 MG tablet Commonly known as: ISORDIL Take 1 tablet (30 mg total) by mouth 3 (three) times daily.   metoprolol succinate 100 MG 24 hr tablet Commonly known as: Toprol XL Take 1 tablet (100 mg total) by mouth daily for 2 days. Take with or immediately following a meal. What changed: You were already taking a medication with the same name, and this prescription was added. Make sure you understand how and when to take each.   metoprolol succinate 100 MG 24 hr tablet Commonly known as: TOPROL-XL Take 1 tablet (100 mg total) by mouth daily. Take with or immediately following a meal. Start taking on: June 22, 2021 What changed:  medication strength how much to take additional instructions   multivitamin with minerals Tabs tablet Take 1 tablet by mouth daily.   nirmatrelvir/ritonavir  EUA 20 x 150 MG & 10 x 100MG  Tabs Commonly known as: PAXLOVID Take 3 tablets by mouth 2 (two) times daily for 5 days. Patient GFR is 85.   ondansetron 4 MG tablet Commonly known as: ZOFRAN Take 1 tablet (4 mg total) by mouth every 8 (eight) hours as needed.   potassium chloride SA 20 MEQ tablet Commonly known as: KLOR-CON Take 2 tablets (40 mEq total) by mouth daily.        Follow-up Information     Laurier Nancy, MD. Schedule an appointment as soon as possible for a visit in 3 day(s).   Specialty: Cardiology Contact information: [address] [place] [address] [phone]  Allergies  Allergen Reactions   Lisinopril Swelling    Angioedema    Shellfish Allergy Swelling    Consultations: Cardiology   Procedures/Studies: DG Chest 2 View  Result Date: 06/17/2021 CLINICAL DATA:  Shortness of breath, abdominal distension for 3 days, difficulty taking a deep breath, hypertension, asthma EXAM: CHEST - 2 VIEW COMPARISON:  05/04/2021 FINDINGS: Enlargement of cardiac silhouette with pulmonary vascular congestion. Mediastinal contours normal. Minimal RIGHT basilar atelectasis. Atelectasis versus consolidation at retrocardiac LEFT lower lobe with associated small LEFT pleural effusion. Remaining lungs clear. No pneumothorax or acute osseous findings. IMPRESSION: Enlargement of cardiac silhouette with pulmonary vascular congestion. Minimal RIGHT basilar atelectasis. LEFT lower lobe atelectasis versus consolidation with small LEFT pleural effusion. Electronically Signed   By: Ulyses Southward M.D.   On: 06/17/2021 18:21   CT Angio Chest PE W and/or Wo Contrast  Result Date: 06/17/2021 EXAM: CT ANGIOGRAPHY CHEST WITH CONTRAST TECHNIQUE: Multidetector CT imaging of the chest was performed using the standard protocol during bolus administration of intravenous contrast. Multiplanar CT image reconstructions and MIPs were obtained to evaluate the vascular anatomy.  CONTRAST:  OMNIPAQUE IOHEXOL 350 MG/ML SOLN COMPARISON:  None. FINDINGS: Cardiovascular: Fairly satisfactory opacification of the pulmonary arteries to the segmental level. Limited evaluation of the subsegmental level due to timing of contrast. No evidence of pulmonary embolism. The main pulmonary artery is normal in caliber. Enlarged left ventricle. No significant pericardial effusion. The thoracic aorta is normal in caliber. No atherosclerotic plaque of the thoracic aorta. No coronary artery calcifications. Mediastinum/Nodes: No enlarged mediastinal, hilar, or axillary lymph nodes. Thyroid gland, trachea, and esophagus demonstrate no significant findings. Lungs/Pleura: Paraseptal emphysematous changes. No focal consolidation. No pulmonary nodule. No pulmonary mass. Trace bilateral pleural effusions, left greater than right. No pneumothorax. Upper Abdomen: No acute abnormality. Musculoskeletal: No chest wall abnormality. No acute or significant osseous findings. Review of the MIP images confirms the above findings. IMPRESSION: 1. No central or segmental pulmonary embolus. Limited evaluation of the subsegmental level. 2. Trace bilateral pleural effusions. 3. Cardiomegaly. 4.  Emphysema (ICD10-J43.9). Electronically Signed   By: Tish Frederickson M.D.   On: 06/17/2021 22:01   CT ABDOMEN PELVIS W CONTRAST  Result Date: 05/26/2021 CLINICAL DATA:  Acute pancreatitis, evaluate complication. Biliary stone. EXAM: CT ABDOMEN AND PELVIS WITH CONTRAST TECHNIQUE: Multidetector CT imaging of the abdomen and pelvis was performed using the standard protocol following bolus administration of intravenous contrast. CONTRAST:  OMNIPAQUE IOHEXOL 350 MG/ML SOLN COMPARISON:  CT May 04, 2021. FINDINGS: Lower chest: Mild four-chamber cardiac enlargement. No acute abnormality. Hepatobiliary: Diffuse hepatic steatosis. No suspicious hepatic lesion. Gallbladder is dilated without wall thickening or pericholecystic fluid. No  biliary ductal dilation. Pancreas: Edematous appearance of the pancreatic head with peripancreatic fluid. No pancreatic ductal dilation. Spleen: Within normal limits. Adrenals/Urinary Tract: 1.4 cm right adrenal nodule and left thickening with a few small adrenal nodules measuring up to 1 cm, stable dating back to CT chest December 04, 2017 and likely representing hyperplasia and small benign adrenal adenomas. Kidneys are normal, without renal calculi, solid enhancing lesion, or hydronephrosis. Bladder is unremarkable. Stomach/Bowel: Stomach is within normal limits. Appendix appears normal. No evidence of bowel wall thickening, distention, or inflammatory changes. Vascular/Lymphatic: The portal, splenic and superior mesenteric veins are patent. No abdominal aortic aneurysm. No pathologically enlarged abdominal or pelvic lymph nodes. Reproductive: Prostate is unremarkable. Other: No walled off fluid collections. No pneumoperitoneum. No portal venous gas. Musculoskeletal: No acute or significant osseous findings. IMPRESSION: 1. Acute  uncomplicated interstitial pancreatitis. No evidence of pancreatic necrosis or walled off fluid collection. 2. Distended gallbladder without wall thickening or pericholecystic fluid, likely reflecting fasting state. Correlation with laboratory values and tenderness to palpation is suggested. 3. Hepatic steatosis. Electronically Signed   By: Maudry MayhewJeffrey  Waltz MD   On: 05/26/2021 23:24   ECHOCARDIOGRAM COMPLETE  Result Date: 06/18/2021    ECHOCARDIOGRAM REPORT   Patient Name:   Mario Beck Date of Exam: 06/18/2021 Medical Rec #:  [id number]     Height:       75.0 in Accession #:    [id number]    Weight:       182.0 lb Date of Birth:  1981-08-28     BSA:          2.108 m Patient Age:    40 years      BP:           122/103 mmHg Patient Gender: M             HR:           97 bpm. Exam Location:  [facility] Procedure: 2D Echo, Cardiac Doppler and Color Doppler Indications:     CHF-acute systolic  I50.21  History:         Patient has prior history of Echocardiogram examinations, most                  recent 09/16/2020. Risk Factors:Hypertension.  Sonographer:     Cristela BlueJerry Hege Referring Phys:  [id number] ANASTASSIA DOUTOVA Diagnosing Phys: Lorine BearsMuhammad [name] MD IMPRESSIONS  1. Left ventricular ejection fraction, by estimation, is <20%. The left ventricle has severely decreased function. The left ventricle demonstrates global hypokinesis. The left ventricular internal cavity size was moderately to severely dilated. There is  mild left ventricular hypertrophy. Left ventricular diastolic parameters are consistent with Grade II diastolic dysfunction (pseudonormalization).  2. Right ventricular systolic function is moderately reduced. The right ventricular size is mildly enlarged. There is normal pulmonary artery systolic pressure.  3. Left atrial size was severely dilated.  4. Right atrial size was moderately dilated.  5. The mitral valve is normal in structure. Moderate to severe mitral valve regurgitation. No evidence of mitral stenosis.  6. Tricuspid valve regurgitation is moderate.  7. The aortic valve is normal in structure. Aortic valve regurgitation is not visualized. No aortic stenosis is present.  8. The inferior vena cava is normal in size with greater than 50% respiratory variability, suggesting right atrial pressure of 3 mmHg. FINDINGS  Left Ventricle: Left ventricular ejection fraction, by estimation, is <20%. The left ventricle has severely decreased function. The left ventricle demonstrates global hypokinesis. The left ventricular internal cavity size was moderately to severely dilated. There is mild left ventricular hypertrophy. Left ventricular diastolic parameters are consistent with Grade II diastolic dysfunction (pseudonormalization). Right Ventricle: The right ventricular size is mildly enlarged. No increase in right ventricular wall thickness. Right ventricular systolic function is moderately reduced.  There is normal pulmonary artery systolic pressure. The tricuspid regurgitant velocity is 2.38 m/s, and with an assumed right atrial pressure of 3 mmHg, the estimated right ventricular systolic pressure is 25.7 mmHg. Left Atrium: Left atrial size was severely dilated. Right Atrium: Right atrial size was moderately dilated. Pericardium: There is no evidence of pericardial effusion. Mitral Valve: The mitral valve is normal in structure. Moderate to severe mitral valve regurgitation. No evidence of mitral valve stenosis. Tricuspid Valve: The tricuspid valve is normal in structure. Tricuspid valve regurgitation is  moderate . No evidence of tricuspid stenosis. Aortic Valve: The aortic valve is normal in structure. Aortic valve regurgitation is not visualized. No aortic stenosis is present. Aortic valve mean gradient measures 1.3 mmHg. Aortic valve peak gradient measures 2.6 mmHg. Aortic valve area, by VTI measures 1.69 cm. Pulmonic Valve: The pulmonic valve was normal in structure. Pulmonic valve regurgitation is mild. No evidence of pulmonic stenosis. Aorta: The aortic root is normal in size and structure. Venous: The inferior vena cava is normal in size with greater than 50% respiratory variability, suggesting right atrial pressure of 3 mmHg. IAS/Shunts: No atrial level shunt detected by color flow Doppler.  LEFT VENTRICLE PLAX 2D LVIDd:         6.90 cm      Diastology LVIDs:         6.40 cm      LV e' medial:    5.11 cm/s LV PW:         1.30 cm      LV E/e' medial:  18.3 LV IVS:        1.10 cm      LV e' lateral:   6.96 cm/s LVOT diam:     2.30 cm      LV E/e' lateral: 13.4 LV SV:         16 LV SV Index:   8 LVOT Area:     4.15 cm  LV Volumes (MOD) LV vol d, MOD A2C: 308.0 ml LV vol d, MOD A4C: 299.0 ml LV vol s, MOD A2C: 257.0 ml LV vol s, MOD A4C: 238.0 ml LV SV MOD A2C:     51.0 ml LV SV MOD A4C:     299.0 ml LV SV MOD BP:      47.9 ml RIGHT VENTRICLE RV Basal diam:  5.40 cm RV S prime:     8.05 cm/s TAPSE  (M-mode): 5.3 cm LEFT ATRIUM              Index       RIGHT ATRIUM           Index LA diam:        5.20 cm  2.47 cm/m  RA Area:     25.50 cm LA Vol (A2C):   133.0 ml 63.09 ml/m RA Volume:   73.00 ml  34.63 ml/m LA Vol (A4C):   127.0 ml 60.24 ml/m LA Biplane Vol: 131.0 ml 62.14 ml/m  AORTIC VALVE                   PULMONIC VALVE AV Area (Vmax):    1.89 cm    PV Vmax:        0.37 m/s AV Area (Vmean):   1.65 cm    PV Peak grad:   0.6 mmHg AV Area (VTI):     1.69 cm    RVOT Peak grad: 1 mmHg AV Vmax:           80.33 cm/s AV Vmean:          53.300 cm/s AV VTI:            0.098 m AV Peak Grad:      2.6 mmHg AV Mean Grad:      1.3 mmHg LVOT Vmax:         36.60 cm/s LVOT Vmean:        21.200 cm/s LVOT VTI:          0.040 m LVOT/AV VTI ratio:  0.41  AORTA Ao Root diam: 3.00 cm MITRAL VALVE               TRICUSPID VALVE MV Area (PHT): 6.02 cm    TR Peak grad:   22.7 mmHg MV Decel Time: 126 msec    TR Vmax:        238.00 cm/s MV E velocity: 93.40 cm/s                            SHUNTS                            Systemic VTI:  0.04 m                            Systemic Diam: 2.30 cm [name] MD Electronically signed by [name] MD Signature Date/Time: 06/18/2021/3:51:01 PM    Final    US ABDOMEN LIMITED RUQ (LIVER/GB)  Result Date: 05/28/2021 CLINICAL DATA:  Epigastric pain EXAM: ULTRASOUND ABDOMEN LIMITED RIGHT UPPER QUADRANT COMPARISON:  CT 05/26/2021 FINDINGS: Gallbladder: Moderate gallbladder distension (diameter 4.7 cm, length 10.2 cm). No gallbladder wall thickening or pericholecystic fluid. No visible echogenic shadowing gallstones or biliary sludge. Sonographic Eulah Pont sign is reportedly negative. Common bile duct: Diameter: 3.8 mm, nondilated. Liver: Echogenic 1.4 x 1 x 2.2 cm focus adjacent the porta hepatis corresponding to a geographic region of hypoattenuation on narrow windows CT imaging may reflect more focal fatty infiltration in this location or hemangioma. No other focal liver lesion.  Otherwise normal hepatic echogenicity. Smooth surface contour. Portal vein is patent on color Doppler imaging with normal direction of blood flow towards the liver. Other: None. IMPRESSION: Isolated gallbladder distension without supportive sonographic features of acute cholecystitis or biliary ductal dilatation. Can be related to a fasting state however there is persisting concern, HIDA could be obtained. Echogenic 2.2 cm focus adjacent the porta hepatis most likely to reflect an area of focal fatty infiltration versus less likely hepatic hemangioma or other primary hepatic lesion particularly given a geographic hypoattenuation seen in this vicinity on comparison CT (CT series 2, image 25). However, if further characterization is clinically warranted based on patient risk factors and demographics, outpatient nonemergent hepatic MRI with contrast could be obtained when patient is better able to tolerate. Electronically Signed   By: Kreg Shropshire M.D.   On: 05/28/2021 03:40      Subjective: Has some bloating and nausea today.  No orthopnea, no leg swelling.  Breathing is better.  No chest pain.    Discharge Exam: Vitals:   06/21/21 0429 06/21/21 0802  BP: (!) 123/98 (!) 132/95  Pulse: 87 93  Resp: 16   Temp: 98.1 F (36.7 C) 97.9 F (36.6 C)  SpO2: 100% 98%   Vitals:   06/20/21 1615 06/20/21 2317 06/21/21 0429 06/21/21 0802  BP: 116/87 (!) 116/91 (!) 123/98 (!) 132/95  Pulse: 86 91 87 93  Resp: 18 16 16    Temp: 97.7 F (36.5 C) (!) 97.5 F (36.4 C) 98.1 F (36.7 C) 97.9 F (36.6 C)  TempSrc: Oral Oral Oral   SpO2: 99% 100% 100% 98%  Weight:   89 kg   Height:        General: Pt is alert, awake, not in acute distress Cardiovascular: RRR, nl S1-S2, no murmurs appreciated.   No LE edema.   Respiratory: Normal  respiratory rate and rhythm.  CTAB without rales or wheezes. Abdominal: Abdomen soft and non-tender.  No distension or HSM.   Neuro/Psych: Strength symmetric in upper and lower  extremities.  Judgment and insight appear normal.   The results of significant diagnostics from this hospitalization (including imaging, microbiology, ancillary and laboratory) are listed below for reference.     Microbiology: Recent Results (from the past 240 hour(s))  Resp Panel by RT-PCR (Flu A&B, Covid) Nasopharyngeal Swab     Status: Abnormal   Collection Time: 06/17/21  5:18 PM   Specimen: Nasopharyngeal Swab; Nasopharyngeal(NP) swabs in vial transport medium  Result Value Ref Range Status   SARS Coronavirus 2 by RT PCR POSITIVE (A) NEGATIVE Final    Comment: RESULT CALLED TO, READ BACK BY AND VERIFIED WITH: JENNIFER GREGORY @1829  06/17/21 MJU (NOTE) SARS-CoV-2 target nucleic acids are DETECTED.  The SARS-CoV-2 RNA is generally detectable in upper respiratory specimens during the acute phase of infection. Positive results are indicative of the presence of the identified virus, but do not rule out bacterial infection or co-infection with other pathogens not detected by the test. Clinical correlation with patient history and other diagnostic information is necessary to determine patient infection status. The expected result is Negative.  Fact Sheet for Patients: 08/17/21  Fact Sheet for Healthcare Providers: BloggerCourse.com  This test is not yet approved or cleared by the [employer] FDA and  has been authorized for detection and/or diagnosis of SARS-CoV-2 by FDA under an Emergency Use Authorization (EUA).  This EUA will remain in effect (meaning this test can be  used) for the duration of  the COVID-19 declaration under Section 564(b)(1) of the Act, 21 U.S.C. section 360bbb-3(b)(1), unless the authorization is terminated or revoked sooner.     Influenza A by PCR NEGATIVE NEGATIVE Final   Influenza B by PCR NEGATIVE NEGATIVE Final    Comment: (NOTE) The Xpert Xpress SARS-CoV-2/FLU/RSV plus assay is intended as  an aid in the diagnosis of influenza from Nasopharyngeal swab specimens and should not be used as a sole basis for treatment. Nasal washings and aspirates are unacceptable for Xpert Xpress SARS-CoV-2/FLU/RSV testing.  Fact Sheet for Patients: [place]  Fact Sheet for Healthcare Providers: BloggerCourse.com  This test is not yet approved or cleared by the [employer] FDA and has been authorized for detection and/or diagnosis of SARS-CoV-2 by FDA under an Emergency Use Authorization (EUA). This EUA will remain in effect (meaning this test can be used) for the duration of the COVID-19 declaration under Section 564(b)(1) of the Act, 21 U.S.C. section 360bbb-3(b)(1), unless the authorization is terminated or revoked.  Performed at [facility], [address] [place], [place]      Labs: BNP (last 3 results) Recent Labs    12/11/20 0315 06/17/21 1406  BNP 1,843.9* 3,688.9*   Basic Metabolic Panel: Recent Labs  Lab 06/17/21 1406 06/18/21 0153 06/19/21 0427 06/20/21 0411 06/21/21 0435  NA 136 137 139 137 137  K 3.1* 3.4* 3.1* 3.8 4.1  CL 100 102 100 104 104  CO2 24 25 27 22 23   GLUCOSE 120* 118* 119* 126* 110*  BUN 21* 21* 19 22* 26*  CREATININE 1.17 1.07 1.02 1.16 1.38*  CALCIUM 8.7* 8.4* 8.9 9.1 9.3  MG 1.7 1.9  --   --   --   PHOS  --  3.7  --   --   --    Liver Function Tests: Recent Labs  Lab 06/17/21 1406 06/18/21  0153  AST 50* 36  ALT 35 29  ALKPHOS 172* 149*  BILITOT 1.0 0.8  PROT 6.8 6.2*  ALBUMIN 3.5 3.3*   Recent Labs  Lab 06/17/21 1406  LIPASE 18   No results for input(s): AMMONIA in the last 168 hours. CBC: Recent Labs  Lab 06/17/21 1406 06/18/21 0153 06/19/21 0427 06/20/21 0411 06/21/21 0435  WBC 6.7 6.3 6.2 7.9 7.3  NEUTROABS  --  3.3 3.4 4.3 3.6  HGB 12.3* 11.8* 11.8* 11.3* 11.3*  HCT 34.8* 33.6* 33.2* 32.5* 32.5*  MCV 85.1 85.5 85.3 85.5 83.5  PLT  201 212 218 230 247   Cardiac Enzymes: No results for input(s): CKTOTAL, CKMB, CKMBINDEX, TROPONINI in the last 168 hours. BNP: Invalid input(s): POCBNP CBG: No results for input(s): GLUCAP in the last 168 hours. D-Dimer Recent Labs    06/19/21 0427 06/20/21 0411  DDIMER 1.20* 1.02*   Hgb A1c No results for input(s): HGBA1C in the last 72 hours. Lipid Profile No results for input(s): CHOL, HDL, LDLCALC, TRIG, CHOLHDL, LDLDIRECT in the last 72 hours. Thyroid function studies No results for input(s): TSH, T4TOTAL, T3FREE, THYROIDAB in the last 72 hours.  Invalid input(s): FREET3 Anemia work up No results for input(s): VITAMINB12, FOLATE, FERRITIN, TIBC, IRON, RETICCTPCT in the last 72 hours. Urinalysis    Component Value Date/Time   COLORURINE YELLOW (A) 05/27/2021 2244   APPEARANCEUR HAZY (A) 05/27/2021 2244   LABSPEC 1.018 05/27/2021 2244   PHURINE 5.0 05/27/2021 2244   GLUCOSEU 50 (A) 05/27/2021 2244   HGBUR SMALL (A) 05/27/2021 2244   BILIRUBINUR NEGATIVE 05/27/2021 2244   KETONESUR NEGATIVE 05/27/2021 2244   PROTEINUR 100 (A) 05/27/2021 2244   NITRITE NEGATIVE 05/27/2021 2244   LEUKOCYTESUR TRACE (A) 05/27/2021 2244   Sepsis Labs Invalid input(s): PROCALCITONIN,  WBC,  LACTICIDVEN Microbiology Recent Results (from the past 240 hour(s))  Resp Panel by RT-PCR (Flu A&B, Covid) Nasopharyngeal Swab     Status: Abnormal   Collection Time: 06/17/21  5:18 PM   Specimen: Nasopharyngeal Swab; Nasopharyngeal(NP) swabs in vial transport medium  Result Value Ref Range Status   SARS Coronavirus 2 by RT PCR POSITIVE (A) NEGATIVE Final    Comment: RESULT CALLED TO, READ BACK BY AND VERIFIED WITH: JENNIFER GREGORY @1829  06/17/21 MJU (NOTE) SARS-CoV-2 target nucleic acids are DETECTED.  The SARS-CoV-2 RNA is generally detectable in upper respiratory specimens during the acute phase of infection. Positive results are indicative of the presence of the identified virus, but do not  rule out bacterial infection or co-infection with other pathogens not detected by the test. Clinical correlation with patient history and other diagnostic information is necessary to determine patient infection status. The expected result is Negative.  Fact Sheet for Patients: 08/17/21  Fact Sheet for Healthcare Providers: BloggerCourse.com  This test is not yet approved or cleared by the [place] FDA and  has been authorized for detection and/or diagnosis of SARS-CoV-2 by FDA under an Emergency Use Authorization (EUA).  This EUA will remain in effect (meaning this test can be  used) for the duration of  the COVID-19 declaration under Section 564(b)(1) of the Act, 21 U.S.C. section 360bbb-3(b)(1), unless the authorization is terminated or revoked sooner.     Influenza A by PCR NEGATIVE NEGATIVE Final   Influenza B by PCR NEGATIVE NEGATIVE Final    Comment: (NOTE) The Xpert Xpress SARS-CoV-2/FLU/RSV plus assay is intended as an aid in the diagnosis of influenza from Nasopharyngeal swab specimens and should not  be used as a sole basis for treatment. Nasal washings and aspirates are unacceptable for Xpert Xpress SARS-CoV-2/FLU/RSV testing.  Fact Sheet for Patients: BloggerCourse.com  Fact Sheet for Healthcare Providers: SeriousBroker.it  This test is not yet approved or cleared by the [place] FDA and has been authorized for detection and/or diagnosis of SARS-CoV-2 by FDA under an Emergency Use Authorization (EUA). This EUA will remain in effect (meaning this test can be used) for the duration of the COVID-19 declaration under Section 564(b)(1) of the Act, 21 U.S.C. section 360bbb-3(b)(1), unless the authorization is terminated or revoked.  Performed at [facility], [address] [place], [place] [address]      Time coordinating discharge: 25  minutes The [place] controlled substances registry was reviewed for this patient    30 Day Unplanned Readmission Risk Score    Flowsheet Row ED to Hosp-Admission (Discharged) from 06/17/2021 in [facility]  30 Day Unplanned Readmission Risk Score (%) 24.65 Filed at 06/21/2021 1200       This score is the patient's risk of an unplanned readmission within 30 days of being discharged (0 -100%). The score is based on dignosis, age, lab data, medications, orders, and past utilization.   Low:  0-14.9   Medium: 15-21.9   High: 22-29.9   Extreme: 30 and above            SIGNED:   Alberteen Sam, MD  [facility] 06/21/2021, 3:25 PM

## 2021-06-21 NOTE — Progress Notes (Signed)
SUBJECTIVE: still SOB   Vitals:   06/20/21 1615 06/20/21 2317 06/21/21 0429 06/21/21 0802  BP: 116/87 (!) 116/91 (!) 123/98 (!) 132/95  Pulse: 86 91 87 93  Resp: 18 16 16    Temp: 97.7 F (36.5 C) (!) 97.5 F (36.4 C) 98.1 F (36.7 C) 97.9 F (36.6 C)  TempSrc: Oral Oral Oral   SpO2: 99% 100% 100% 98%  Weight:   89 kg   Height:        Intake/Output Summary (Last 24 hours) at 06/21/2021 1002 Last data filed at 06/21/2021 0843 Gross per 24 hour  Intake 720 ml  Output 2300 ml  Net -1580 ml    LABS: Basic Metabolic Panel: Recent Labs    06/20/21 0411 06/21/21 0435  NA 137 137  K 3.8 4.1  CL 104 104  CO2 22 23  GLUCOSE 126* 110*  BUN 22* 26*  CREATININE 1.16 1.38*  CALCIUM 9.1 9.3   Liver Function Tests: No results for input(s): AST, ALT, ALKPHOS, BILITOT, PROT, ALBUMIN in the last 72 hours. No results for input(s): LIPASE, AMYLASE in the last 72 hours. CBC: Recent Labs    06/20/21 0411 06/21/21 0435  WBC 7.9 7.3  NEUTROABS 4.3 3.6  HGB 11.3* 11.3*  HCT 32.5* 32.5*  MCV 85.5 83.5  PLT 230 247   Cardiac Enzymes: No results for input(s): CKTOTAL, CKMB, CKMBINDEX, TROPONINI in the last 72 hours. BNP: Invalid input(s): POCBNP D-Dimer: Recent Labs    06/19/21 0427 06/20/21 0411  DDIMER 1.20* 1.02*   Hemoglobin A1C: No results for input(s): HGBA1C in the last 72 hours. Fasting Lipid Panel: No results for input(s): CHOL, HDL, LDLCALC, TRIG, CHOLHDL, LDLDIRECT in the last 72 hours. Thyroid Function Tests: No results for input(s): TSH, T4TOTAL, T3FREE, THYROIDAB in the last 72 hours.  Invalid input(s): FREET3 Anemia Panel: No results for input(s): VITAMINB12, FOLATE, FERRITIN, TIBC, IRON, RETICCTPCT in the last 72 hours.   PHYSICAL EXAM General: Well developed, well nourished, in no acute distress HEENT:  Normocephalic and atramatic Neck:  No JVD.  Lungs: Clear bilaterally to auscultation and percussion. Heart: HRRR . Normal S1 and S2 without gallops  or murmurs.  Abdomen: Bowel sounds are positive, abdomen soft and non-tender  Msk:  Back normal, normal gait. Normal strength and tone for age. Extremities: No clubbing, cyanosis or edema.   Neuro: Alert and oriented X 3. Psych:  Good affect, responds appropriately  TELEMETRY: NSR  ASSESSMENT AND PLAN:CHF/EF 20%. May go home f/u Monday next week 1 pm  Active Problems:   HTN (hypertension), malignant   Tobacco abuse   GERD (gastroesophageal reflux disease)   Elevated troponin   Acute on chronic systolic CHF (congestive heart failure) (HCC)   Acute on chronic combined systolic and diastolic CHF (congestive heart failure) (HCC)   Hypokalemia   COVID-19 virus infection   Hypomagnesemia   CHF (congestive heart failure) (HCC)    Mario Kawa A, MD, [facility] 06/21/2021 10:02 AM

## 2021-06-22 ENCOUNTER — Other Ambulatory Visit: Payer: Self-pay

## 2021-06-23 ENCOUNTER — Other Ambulatory Visit: Payer: Self-pay

## 2021-06-24 ENCOUNTER — Other Ambulatory Visit: Payer: Self-pay

## 2021-07-01 ENCOUNTER — Telehealth: Payer: Self-pay | Admitting: Gerontology

## 2021-07-01 NOTE — Progress Notes (Deleted)
   Patient ID: Mario Beck, male    DOB: 1981/07/19, 40 y.o.   MRN: [id number]  HPI  Mr Leino is a 40 y/o male with a history of  Echo report from 06/18/21 reviewed and showed an EF of < 20% with severe LAE, moderate/severe MR and moderate TR.   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 Cardiology consult obtained. Has angioedema with ACE-I. Marcelline Deist held due to vomiting. Initially given IV lasix with transition to oral medications. Found to be covid +. Discharged after 4 days.   He presents today for his initial visit with a chief complaint of   Review of Systems    Physical Exam    Assessment & Plan:  1: Chronic heart failure with reduced ejection fraction- - NYHA class - BNP 06/17/21 was 3688.9  2: HTN- - BP - BMP 06/21/21 reviewed and showed sodium 137, potassium 4.1, creatinine 1.38 and GFR >60  3: Alcohol use-  4: Tobacco use-

## 2021-07-01 NOTE — Telephone Encounter (Signed)
Called but did not answer. Left a voicemail explaining the application process.

## 2021-07-01 NOTE — Telephone Encounter (Signed)
Lvm asking if they need any assistance with the application process

## 2021-07-02 ENCOUNTER — Telehealth: Payer: Self-pay | Admitting: Family

## 2021-07-02 ENCOUNTER — Ambulatory Visit: Payer: Self-pay | Admitting: Family

## 2021-07-02 NOTE — Telephone Encounter (Signed)
Patient did not show for his Heart Failure Clinic appointment on 07/02/21 and we were unable to reach him to confirm appt. Will attempt to reschedule.

## 2021-07-06 NOTE — Telephone Encounter (Signed)
lvm

## 2021-07-26 ENCOUNTER — Other Ambulatory Visit: Payer: Self-pay

## 2021-11-19 ENCOUNTER — Telehealth: Payer: Self-pay | Admitting: Pharmacist

## 2021-11-19 NOTE — Telephone Encounter (Signed)
Patient failed to provide requested 2023 financial documentation. No additional medication assistance will be provided by [facility] without the required proof of income documentation. Patient notified by letter. ? ?[name] ?Medication Management ?

## 2021-11-24 ENCOUNTER — Other Ambulatory Visit: Payer: Self-pay

## 2021-12-03 ENCOUNTER — Telehealth: Payer: Self-pay | Admitting: Pharmacy Technician

## 2021-12-03 NOTE — Telephone Encounter (Signed)
Patient failed to provide 2023 proof of income.  No additional medication assistance will be provided by [facility] without the required proof of income documentation.  Patient notified by letter.  Mario Beck Care Manager [facility]    [address] [place], [place]  [address]  November 19, 2021    Dear Mario Beck:  This is to inform you that you are no longer eligible to receive medication assistance at [facility].  The reason(s) are:    _____Your total gross monthly household income exceeds 300% of the Federal Poverty Level.   _____Tangible assets (savings, checking, stocks/bonds, pension, retirement, etc.) exceeds our limit  _____You are eligible to receive benefits from [facility], [facility] or HIV Medication              Assistance Program _____You are eligible to receive benefits from a Medicare Part D plan _____You have prescription insurance  _____You are not an [facility] resident __X__Failure to provide all requested documentation (proof of income for 2023, and/or Patient Intake Application, DOH Attestation, Contract, etc).    Medication assistance will resume once all requested documentation has been returned to our clinic.  If you have questions, please contact our clinic at [phone].    Thank you,  [facility]

## 2021-12-08 ENCOUNTER — Other Ambulatory Visit: Payer: Self-pay

## 2021-12-08 ENCOUNTER — Emergency Department
Admission: EM | Admit: 2021-12-08 | Discharge: 2021-12-08 | Disposition: A | Discharge status: Home / Self Care | Payer: Medicaid Other | Attending: Emergency Medicine | Admitting: Emergency Medicine

## 2021-12-08 ENCOUNTER — Emergency Department: Payer: Medicaid Other

## 2021-12-08 DIAGNOSIS — D72829 Elevated white blood cell count, unspecified: Secondary | ICD-10-CM | POA: Diagnosis not present

## 2021-12-08 DIAGNOSIS — K7030 Alcoholic cirrhosis of liver without ascites: Secondary | ICD-10-CM | POA: Diagnosis not present

## 2021-12-08 DIAGNOSIS — K2980 Duodenitis without bleeding: Secondary | ICD-10-CM

## 2021-12-08 DIAGNOSIS — E876 Hypokalemia: Secondary | ICD-10-CM

## 2021-12-08 DIAGNOSIS — I110 Hypertensive heart disease with heart failure: Secondary | ICD-10-CM | POA: Diagnosis not present

## 2021-12-08 DIAGNOSIS — R7989 Other specified abnormal findings of blood chemistry: Secondary | ICD-10-CM

## 2021-12-08 DIAGNOSIS — I2510 Atherosclerotic heart disease of native coronary artery without angina pectoris: Secondary | ICD-10-CM

## 2021-12-08 DIAGNOSIS — Z20822 Contact with and (suspected) exposure to covid-19: Secondary | ICD-10-CM | POA: Insufficient documentation

## 2021-12-08 DIAGNOSIS — F1010 Alcohol abuse, uncomplicated: Secondary | ICD-10-CM

## 2021-12-08 DIAGNOSIS — I700 Atherosclerosis of aorta: Secondary | ICD-10-CM

## 2021-12-08 DIAGNOSIS — I509 Heart failure, unspecified: Secondary | ICD-10-CM

## 2021-12-08 DIAGNOSIS — R0602 Shortness of breath: Secondary | ICD-10-CM | POA: Diagnosis present

## 2021-12-08 DIAGNOSIS — R748 Abnormal levels of other serum enzymes: Secondary | ICD-10-CM | POA: Insufficient documentation

## 2021-12-08 DIAGNOSIS — R778 Other specified abnormalities of plasma proteins: Secondary | ICD-10-CM

## 2021-12-08 DIAGNOSIS — R1013 Epigastric pain: Secondary | ICD-10-CM

## 2021-12-08 LAB — COMPREHENSIVE METABOLIC PANEL
ALT: 34 U/L (ref 0–44)
AST: 65 U/L — ABNORMAL HIGH (ref 15–41)
Albumin: 3.6 g/dL (ref 3.5–5.0)
Alkaline Phosphatase: 170 U/L — ABNORMAL HIGH (ref 38–126)
Anion gap: 20 — ABNORMAL HIGH (ref 5–15)
BUN: 19 mg/dL (ref 6–20)
CO2: 22 mmol/L (ref 22–32)
Calcium: 8.9 mg/dL (ref 8.9–10.3)
Chloride: 99 mmol/L (ref 98–111)
Creatinine, Ser: 1.03 mg/dL (ref 0.61–1.24)
GFR, Estimated: >60 mL/min (ref >60)
Glucose, Bld: 95 mg/dL (ref 70–99)
Potassium: 3.3 mmol/L — ABNORMAL LOW (ref 3.5–5.1)
Sodium: 141 mmol/L (ref 135–145)
Total Bilirubin: 0.9 mg/dL (ref 0.3–1.2)
Total Protein: 6.9 g/dL (ref 6.5–8.1)

## 2021-12-08 LAB — MAGNESIUM: Magnesium: 1.7 mg/dL (ref 1.7–2.4)

## 2021-12-08 LAB — CBC
HCT: 34.5 % — ABNORMAL LOW (ref 39.0–52.0)
Hemoglobin: 11.5 g/dL — ABNORMAL LOW (ref 13.0–17.0)
MCH: 27.6 pg (ref 26.0–34.0)
MCHC: 33.3 g/dL (ref 30.0–36.0)
MCV: 82.9 fL (ref 80.0–100.0)
Platelets: 193 10*3/uL (ref 150–400)
RBC: 4.16 MIL/uL — ABNORMAL LOW (ref 4.22–5.81)
RDW: 18.7 % — ABNORMAL HIGH (ref 11.5–15.5)
WBC: 5.7 10*3/uL (ref 4.0–10.5)
nRBC: 0.5 % — ABNORMAL HIGH (ref 0.0–0.2)

## 2021-12-08 LAB — BRAIN NATRIURETIC PEPTIDE: B Natriuretic Peptide: 3958.7 pg/mL — ABNORMAL HIGH (ref 0.0–100.0)

## 2021-12-08 LAB — URINALYSIS, ROUTINE W REFLEX MICROSCOPIC
Bacteria, UA: NONE SEEN
Bilirubin Urine: NEGATIVE
Glucose, UA: NEGATIVE mg/dL
Hgb urine dipstick: NEGATIVE
Ketones, ur: 20 mg/dL — AB
Leukocytes,Ua: NEGATIVE
Nitrite: NEGATIVE
Protein, ur: 100 mg/dL — AB
Specific Gravity, Urine: 1.018 (ref 1.005–1.030)
pH: 5 (ref 5.0–8.0)

## 2021-12-08 LAB — RESP PANEL BY RT-PCR (FLU A&B, COVID) ARPGX2
Influenza A by PCR: NEGATIVE
Influenza B by PCR: NEGATIVE
SARS Coronavirus 2 by RT PCR: NEGATIVE

## 2021-12-08 LAB — TROPONIN I (HIGH SENSITIVITY)
Troponin I (High Sensitivity): 50 ng/L — ABNORMAL HIGH (ref <18)
Troponin I (High Sensitivity): 52 ng/L — ABNORMAL HIGH (ref <18)

## 2021-12-08 LAB — LIPASE, BLOOD: Lipase: 22 U/L (ref 11–51)

## 2021-12-08 MED ORDER — SUCRALFATE 1 GM/10ML PO SUSP: 1.0000 g | Freq: Four times a day (QID) | ORAL | 0 refills | Status: DC

## 2021-12-08 MED ORDER — IOHEXOL 350 MG/ML SOLN
100.0000 mL | Freq: Once | INTRAVENOUS | Status: AC | PRN
  Administered 2021-12-08: 100 mL via INTRAVENOUS

## 2021-12-08 MED ORDER — PANTOPRAZOLE SODIUM 40 MG IV SOLR
40.0000 mg | Freq: Once | INTRAVENOUS | Status: AC
  Administered 2021-12-08: 40 mg via INTRAVENOUS
  Filled 2021-12-08: qty 10

## 2021-12-08 MED ORDER — ONDANSETRON 4 MG PO TBDP: 4.0000 mg | ORAL_TABLET | Freq: Three times a day (TID) | ORAL | 0 refills | Status: AC | PRN

## 2021-12-08 MED ORDER — OXYCODONE-ACETAMINOPHEN 5-325 MG PO TABS
1.0000 | ORAL_TABLET | Freq: Once | ORAL | Status: AC
  Administered 2021-12-08: 1 via ORAL
  Filled 2021-12-08: qty 1

## 2021-12-08 MED ORDER — CHLORDIAZEPOXIDE HCL 25 MG PO CAPS: ORAL_CAPSULE | ORAL | 0 refills | Status: AC

## 2021-12-08 MED ORDER — POTASSIUM CHLORIDE CRYS ER 20 MEQ PO TBCR: 40.0000 meq | EXTENDED_RELEASE_TABLET | Freq: Every day | ORAL | 3 refills | Status: DC

## 2021-12-08 MED ORDER — SUCRALFATE 1 G PO TABS
1.0000 g | ORAL_TABLET | Freq: Once | ORAL | Status: AC
  Administered 2021-12-08: 1 g via ORAL
  Filled 2021-12-08: qty 1

## 2021-12-08 MED ORDER — THIAMINE HCL 100 MG PO TABS: 100.0000 mg | ORAL_TABLET | Freq: Every day | ORAL | Status: DC

## 2021-12-08 MED ORDER — LORAZEPAM 2 MG/ML IJ SOLN: 1.0000 mg | INTRAMUSCULAR | Status: DC | PRN

## 2021-12-08 MED ORDER — ALUM & MAG HYDROXIDE-SIMETH 200-200-20 MG/5ML PO SUSP
30.0000 mL | Freq: Once | ORAL | Status: AC
  Administered 2021-12-08: 30 mL via ORAL
  Filled 2021-12-08: qty 30

## 2021-12-08 MED ORDER — MAGNESIUM SULFATE 2 GM/50ML IV SOLN
2.0000 g | Freq: Once | INTRAVENOUS | Status: AC
  Administered 2021-12-08: 2 g via INTRAVENOUS
  Filled 2021-12-08: qty 50

## 2021-12-08 MED ORDER — PANTOPRAZOLE SODIUM 40 MG PO TBEC: 40.0000 mg | DELAYED_RELEASE_TABLET | Freq: Every day | ORAL | 0 refills | Status: DC

## 2021-12-08 MED ORDER — ADULT MULTIVITAMIN W/MINERALS CH: 1.0000 | ORAL_TABLET | Freq: Every day | ORAL | Status: DC

## 2021-12-08 MED ORDER — FUROSEMIDE 40 MG PO TABS: 40.0000 mg | ORAL_TABLET | Freq: Every day | ORAL | 0 refills | Status: DC

## 2021-12-08 MED ORDER — POTASSIUM CHLORIDE CRYS ER 20 MEQ PO TBCR
40.0000 meq | EXTENDED_RELEASE_TABLET | Freq: Once | ORAL | Status: AC
  Administered 2021-12-08: 40 meq via ORAL
  Filled 2021-12-08: qty 2

## 2021-12-08 MED ORDER — FUROSEMIDE 10 MG/ML IJ SOLN
80.0000 mg | Freq: Once | INTRAMUSCULAR | Status: AC
  Administered 2021-12-08: 80 mg via INTRAVENOUS
  Filled 2021-12-08: qty 8

## 2021-12-08 MED ORDER — LORAZEPAM 1 MG PO TABS: 1.0000 mg | ORAL_TABLET | ORAL | Status: DC | PRN

## 2021-12-08 MED ORDER — ONDANSETRON HCL 4 MG/2ML IJ SOLN
4.0000 mg | Freq: Once | INTRAMUSCULAR | Status: AC
  Administered 2021-12-08: 4 mg via INTRAVENOUS
  Filled 2021-12-08: qty 2

## 2021-12-08 MED ORDER — FOLIC ACID 1 MG PO TABS: 1.0000 mg | ORAL_TABLET | Freq: Every day | ORAL | Status: DC

## 2021-12-08 MED ORDER — DIPHENHYDRAMINE HCL 25 MG PO CAPS
25.0000 mg | ORAL_CAPSULE | Freq: Once | ORAL | Status: AC
  Administered 2021-12-08: 25 mg via ORAL
  Filled 2021-12-08: qty 1

## 2021-12-08 MED ORDER — THIAMINE HCL 100 MG/ML IJ SOLN: 100.0000 mg | Freq: Every day | INTRAMUSCULAR | Status: DC

## 2021-12-08 NOTE — ED Provider Notes (Signed)
[facility] Provider Note    Event Date/Time   First MD Initiated Contact with Patient 12/08/21 [phone]     (approximate)   History   Abdominal Pain and Shortness of Breath   HPI  Mario Beck is a 41 y.o. male with a past medical history of alcohol use disorder currently still drinking daily approximately 3-4 beverages, alcoholic cardiomyopathy with most recent echocardiogram from September 2022 showing an EF estimated less than 20% with severely decreased left ventricular function and global hypokinesis as well as a history of HTN and recurrent pancreatitis who presents for evaluation of primarily some epigastric abdominal pain.  Patient states this has been ongoing for last 2 to 3 days.  He has been dry heaving but has not had any significant vomiting.  No diarrhea or urinary symptoms.  He denies any other back pain or lower abdominal pain.  He states he has had a cough and some shortness of breath that he has feels has been getting worse over the last couple of weeks.  He denies any fevers or associated chest pain.  No earache, rash or extremity pain.  He denies any other illicit drug use.  He has been taking ibuprofen for his abdominal pain but does not feel this is helped much.  He denies any known history of ulcers.  No other acute concerns at this time.  He does state he is compliant with his Lasix.       Physical Exam  Triage Vital Signs: ED Triage Vitals  Enc Vitals Group     BP 12/08/21 0304 (!) 133/100     Pulse Rate 12/08/21 0304 (!) 105     Resp 12/08/21 0304 18     Temp 12/08/21 0304 97.6 F (36.4 C)     Temp Source 12/08/21 0304 Oral     SpO2 12/08/21 0304 98 %     Weight 12/08/21 0307 190 lb (86.2 kg)     Height 12/08/21 0307 '6\' 3"'  (1.905 m)     Head Circumference --      Peak Flow --      Pain Score 12/08/21 0307 7     Pain Loc --      Pain Edu? --      Excl. in [place]? --     Most recent vital signs: Vitals:   12/08/21 0645 12/08/21  0653  BP:    Pulse: 100 99  Resp: 19 (!) 25  Temp:    SpO2: 100% 100%    General: Awake, appears mildly uncomfortable CV:  Systolic murmur.  Slightly tachycardic.  2+ radial pulse. Resp:  Normal effort.  Clear bilaterally Abd:  Tenderness in the epigastrium.  No lower quadrant tenderness.  No significant CVA tenderness. Other:  No significant lower extremity tenderness.     ED Results / Procedures / Treatments  Labs (all labs ordered are listed, but only abnormal results are displayed) Labs Reviewed  COMPREHENSIVE METABOLIC PANEL - Abnormal; Notable for the following components:      Result Value   Potassium 3.3 (*)    AST 65 (*)    Alkaline Phosphatase 170 (*)    Anion gap 20 (*)    All other components within normal limits  CBC - Abnormal; Notable for the following components:   RBC 4.16 (*)    Hemoglobin 11.5 (*)    HCT 34.5 (*)    RDW 18.7 (*)    nRBC 0.5 (*)    All  other components within normal limits  URINALYSIS, ROUTINE W REFLEX MICROSCOPIC - Abnormal; Notable for the following components:   Color, Urine YELLOW (*)    APPearance CLEAR (*)    Ketones, ur 20 (*)    Protein, ur 100 (*)    All other components within normal limits  BRAIN NATRIURETIC PEPTIDE - Abnormal; Notable for the following components:   B Natriuretic Peptide 3,958.7 (*)    All other components within normal limits  TROPONIN I (HIGH SENSITIVITY) - Abnormal; Notable for the following components:   Troponin I (High Sensitivity) 50 (*)    All other components within normal limits  TROPONIN I (HIGH SENSITIVITY) - Abnormal; Notable for the following components:   Troponin I (High Sensitivity) 52 (*)    All other components within normal limits  RESP PANEL BY RT-PCR (FLU A&B, COVID) ARPGX2  LIPASE, BLOOD  MAGNESIUM     EKG  EKG shows sinus tachycardia with a ventricular rate of 106, normal axis, unremarkable intervals with evidence of left ventricular hypertrophy and fairly diffuse  nonspecific ST changes in anterior lateral leads.   RADIOLOGY Chest x-ray on my interpretation shows stable cardiomegaly without overt edema, consolidation, pneumothorax or large effusion.  No other clear process.  I also reviewed radiology interpretation and agree with their findings of no acute thoracic process on chest x-ray.  CT chest abdomen pelvis my interpretation shows no large PE, pneumonia, overt edema, evidence of acute pancreatitis, SBO, kidney stone or other clear process of the chest abdomen pelvis, interpretation.  I also reviewed radiology's findings and agree with their interpretation of no evidence of acute pancreatitis or PE but some possible duodenitis as well as evidence of cirrhosis and cardiomegaly with some left ventricular dilation, aortic atherosclerosis and evidence of CAD.  No other acute process on their interpretation of CT.   PROCEDURES:  Critical Care performed: No  Procedures  The patient is on the cardiac monitor to evaluate for evidence of arrhythmia and/or significant heart rate changes.   MEDICATIONS ORDERED IN ED: Medications  LORazepam (ATIVAN) tablet 1-4 mg (has no administration in time range)    Or  LORazepam (ATIVAN) injection 1-4 mg (has no administration in time range)  thiamine tablet 100 mg (has no administration in time range)    Or  thiamine (B-1) injection 100 mg (has no administration in time range)  folic acid (FOLVITE) tablet 1 mg (has no administration in time range)  multivitamin with minerals tablet 1 tablet (has no administration in time range)  magnesium sulfate IVPB 2 g 50 mL (has no administration in time range)  furosemide (LASIX) injection 80 mg (has no administration in time range)  pantoprazole (PROTONIX) injection 40 mg (40 mg Intravenous Given 12/08/21 0507)  oxyCODONE-acetaminophen (PERCOCET/ROXICET) 5-325 MG per tablet 1 tablet (1 tablet Oral Given 12/08/21 0501)  alum & mag hydroxide-simeth (MAALOX/MYLANTA) 200-200-20  MG/5ML suspension 30 mL (30 mLs Oral Given 12/08/21 0501)  sucralfate (CARAFATE) tablet 1 g (1 g Oral Given 12/08/21 0501)  potassium chloride SA (KLOR-CON M) CR tablet 40 mEq (40 mEq Oral Given 12/08/21 0502)  ondansetron (ZOFRAN) injection 4 mg (4 mg Intravenous Given 12/08/21 0516)  iohexol (OMNIPAQUE) 350 MG/ML injection 100 mL (100 mLs Intravenous Contrast Given 12/08/21 0537)     IMPRESSION / MDM / ASSESSMENT AND PLAN / ED COURSE  I reviewed the triage vital signs and the nursing notes.  With regard to patient's shortness of breath and cough differential considerations include decompensated heart failure, ACS, arrhythmia, anemia, PE, bronchitis and symptomatic pleural effusion.  With regard to the epigastric abdominal pain differential considerations include recurrent pancreatitis in the setting of ongoing alcohol abuse, alcoholic gastritis, peptic ulcer disease with lower suspicion for diverticulitis, appendicitis, kidney stone or ruptured AAA at this time based on patient's history and exam.  EKG shows sinus tachycardia with a ventricular rate of 106, normal axis, unremarkable intervals with evidence of left ventricular hypertrophy and fairly diffuse nonspecific ST changes in anterior lateral leads.  Troponin is elevated but stable and close to baseline at 50 and 52.  It seems patient ranges between 40 and 60 over the last couple months.  He has no associated chest pain and overall aspects of mild demand ischemia in the setting of some volume overload but lower suspicion for occlusion MI at this time.  Chest x-ray on my interpretation shows stable cardiomegaly without overt edema, consolidation, pneumothorax or large effusion.  No other clear process.  I also reviewed radiology interpretation and agree with their findings of no acute thoracic process on chest x-ray.  CMP is remarkable for potassium of 3.3, AST of 65 suspect related to alcohol abuse and alk phos of  170.  Anion gap is 20 but bicarb is 22.  CBC shows no leukocytosis and hemoglobin of 11.5 compared to 11.35 months ago.  UA has some ketones and protein but no evidence of infection or blood.  Magnesium 1.7.  BNP is quite elevated at 3958.7 compared to 3688 5 months ago.  CT chest abdomen pelvis my interpretation shows no large PE, pneumonia, overt edema, evidence of acute pancreatitis, SBO, kidney stone or other clear process of the chest abdomen pelvis, interpretation.  I also reviewed radiology's findings and agree with their interpretation of no evidence of acute pancreatitis or PE but some possible duodenitis as well as evidence of cirrhosis and cardiomegaly with some left ventricular dilation, aortic atherosclerosis and evidence of CAD.  No other acute process on their interpretation of CT.  Overall my impression is for mild volume overload as evidenced by elevated BNP and left ventricular dilation of the patient is not hypoxic or in any respiratory distress.  We will give a dose of IV Lasix here and a refill for his Lasix as he states he needs a prescription renewal for this.  With regard to his abdominal pain I suspect some gastritis and duodenitis likely related to ongoing alcohol use possibly exacerbated by NSAID use.  Discussed cessation of NSAIDs.  Patient given Protonix Carafate and Maalox in the emergency room.  Had extensive discussion with the patient regarding risks of ongoing alcohol use.  Patient states he thinks he was willing to attempt to cut down completely and is amenable to trial of Librium.  Discussed dangers of any ongoing alcohol use while taking Librium as this can lead to respiratory depression and death.  He states he understands this and will not drink any alcohol if he takes the Librium.  Librium taper prescribed.  Recommended follow-up with [facility] for further assistance with his alcohol use.  We will also prescribe refill for his potassium and a course of Carafate and  Protonix.  Discussed my recommendation for him to follow-up in heart failure clinic and referral was also placed for this.  I will suspicion for autoimmune life-threatening process I think at this point patient is stable for discharge with outpatient follow-up.  Discharged in  stable condition.     FINAL CLINICAL IMPRESSION(S) / ED DIAGNOSES   Final diagnoses:  Hypokalemia  ETOH abuse  Troponin I above reference range  Elevated brain natriuretic peptide (BNP) level  Acute on chronic congestive heart failure, unspecified heart failure type (HCC)  Epigastric abdominal pain  Aortic atherosclerosis (HCC)  Duodenitis  Alcoholic cirrhosis, unspecified whether ascites present ([place])  Coronary artery disease involving native heart without angina pectoris, unspecified vessel or lesion type     Rx / DC Orders   ED Discharge Orders          Ordered    chlordiazePOXIDE (LIBRIUM) 25 MG capsule        12/08/21 0645    furosemide (LASIX) 40 MG tablet  Daily        12/08/21 0645    ondansetron (ZOFRAN-ODT) 4 MG disintegrating tablet  Every 8 hours PRN        12/08/21 0645    potassium chloride SA (KLOR-CON M) 20 MEQ tablet  Daily        12/08/21 0645    pantoprazole (PROTONIX) 40 MG tablet  Daily        12/08/21 0646    AMB referral to CHF clinic        12/08/21 0649    sucralfate (CARAFATE) 1 GM/10ML suspension  4 times daily        12/08/21 8003             Note:  This document was prepared using Dragon voice recognition software and may include unintentional dictation errors.   Lucrezia Starch, MD 12/08/21 [phone]

## 2021-12-08 NOTE — ED Notes (Signed)
RN went into room. Pt in bed resting comfortably. Magnesium still infusing. Pt informed of plan to DC after infusion is complete. Pt requesting diet ginger ale and graham crackers, which were provided by this RN.

## 2021-12-08 NOTE — ED Notes (Signed)
RN in room to DC pt. Pt c/o itching. RN spoke with MD and EDP ordered PO benadryl.

## 2021-12-08 NOTE — Discharge Instructions (Addendum)
Your CT today showed: 1. No evidence of pulmonary embolism. 2. No imaging findings of acute pancreatitis. 3. Subtle mural thickening of the descending portion of the duodenum with surrounding inflammatory changes, which could indicate an acute duodenitis. 4. Morphologic changes in the liver indicative of underlying cirrhosis. 5. Cardiomegaly with left ventricular dilatation. 6. Aortic atherosclerosis, in addition to left anterior descending coronary artery disease. Please note that although the presence of coronary artery calcium documents the presence of coronary artery disease, the severity of this disease and any potential stenosis cannot be assessed on this non-gated CT examination. Assessment for potential risk factor modification, dietary therapy or pharmacologic therapy may be warranted, if clinically indicated.

## 2021-12-08 NOTE — ED Triage Notes (Signed)
Pt arrived via POV with reports of generalized abd pain x 2 days along with shortness of breath and vomiting and dry heaving. Pt states he feels like it is his pancreas with hx of pancreatitis.

## 2021-12-17 ENCOUNTER — Ambulatory Visit: Payer: Self-pay | Admitting: Family

## 2021-12-27 NOTE — Progress Notes (Deleted)
?   Patient ID: Mario Beck, male    DOB: [birth year], 41 y.o.   MRN: [id number] ? ?HPI ? ?Mr Skufca is a 41 y/o male with a history of ? ?Echo report from 06/17/21 reviewed and showed an EF of <20% along with mild LVH, severe LAE, moderate/ severe MR and moderate TR.  ? ?Was in the ED 12/08/21 due to abdominal pain and SOB. CT chest abdomen pelvis were negative for acute process. Libruim started due to alcohol use with assurances that he will not drink and he was released.  ? ?He presents today for his initial visit with a chief complaint of  ? ?Review of Systems ? ? ? ?Physical Exam ? ?  ?Assessment & Plan: ? ?1: Chronic heart failure with reduced ejection fraction- ?- NYHA class ?- on GDMT of metoprolol succinate ?- angioedema with lisinopril ?- BNP 12/08/21 was 3958.7 ? ?2: HTN- ?- BP  ?- BMP 12/08/21 reviewed and showed sodium 141, potassium 3.3, creatinine 1.03 and GFR >60 ?- recheck BMP today due to low potassium ? ?3: Pancreatitis- ?- lipase 12/08/21 was 22 and AST elevated at 65 ? ?4: Alcohol use- ?- ? [facility] ? ?

## 2021-12-28 ENCOUNTER — Ambulatory Visit: Payer: Self-pay | Admitting: Family

## 2021-12-28 ENCOUNTER — Telehealth: Payer: Self-pay | Admitting: Family

## 2021-12-28 NOTE — Telephone Encounter (Signed)
Patient did not show for his Heart Failure Clinic appointment on 12/28/21. Will attempt to reschedule.   ?

## 2022-01-06 IMAGING — US US ABDOMEN LIMITED
1 series · 14 of 25 positions shown · non-contrast
Comparison: Abdomen CT 02/17/2020

CLINICAL DATA: Elevated LFTs.  Abdominal pain with nausea vomiting.

EXAM:
ULTRASOUND ABDOMEN LIMITED RIGHT UPPER QUADRANT

[Series 1: us abdomen limited ruq · 14 of 49 slices shown]
[im 1/49]
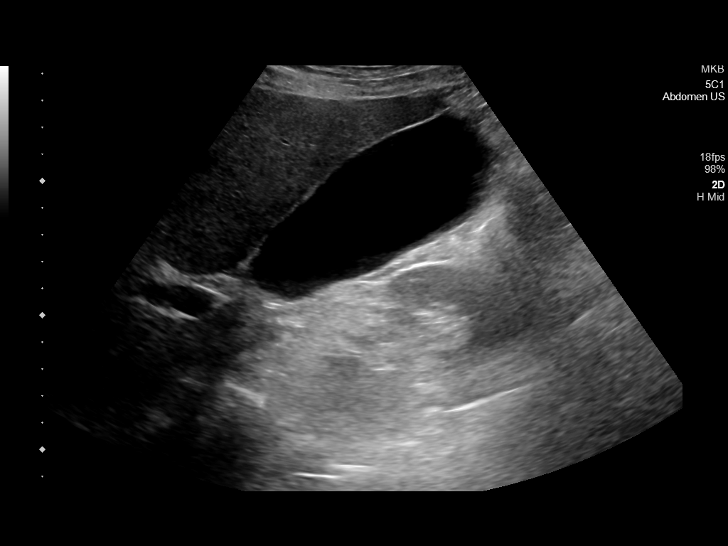
[im 5/49]
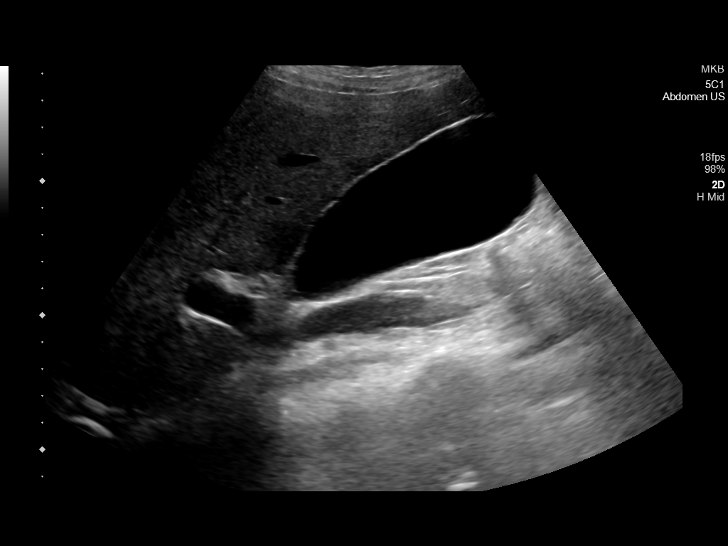
[im 9/49]
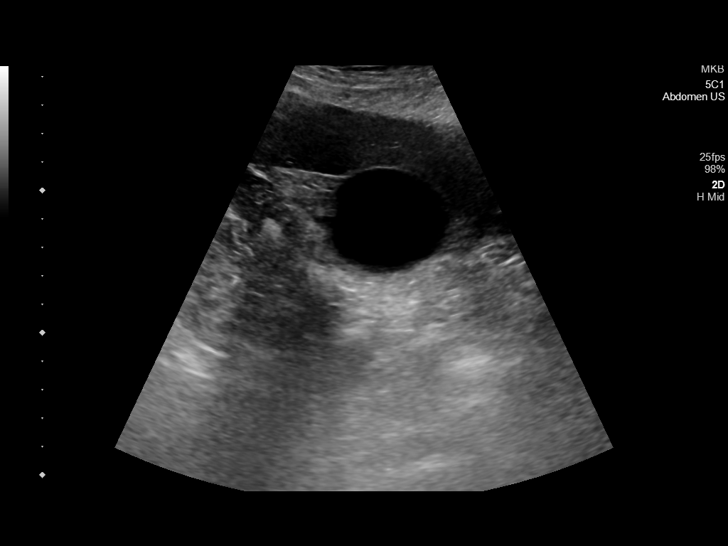
[im 13/49]
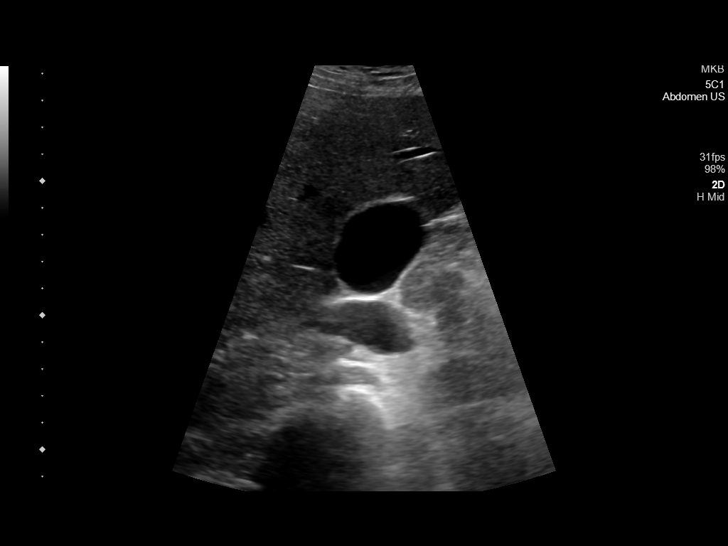
[im 17/49]
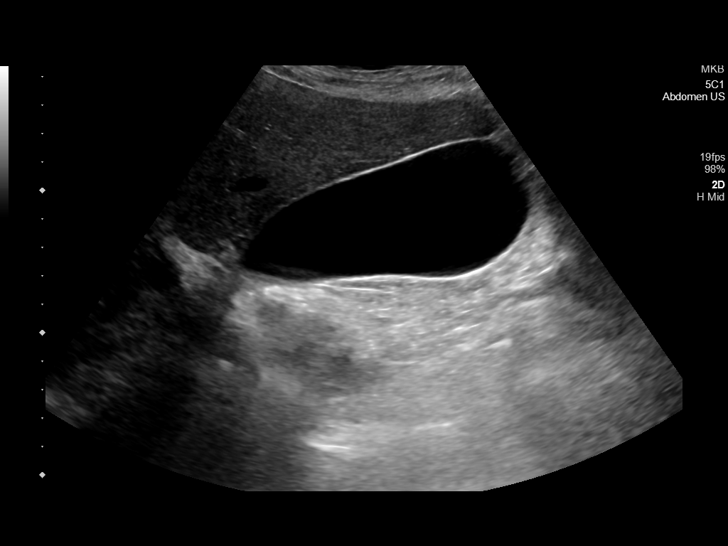
[im 19/49]
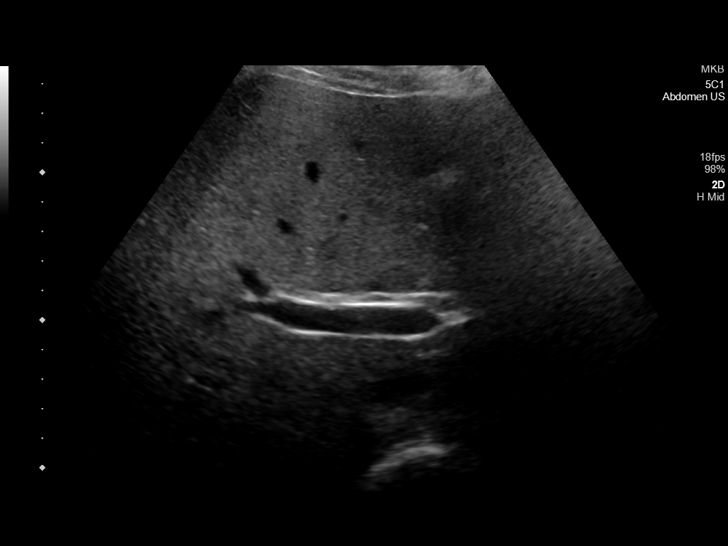
[im 23/49]
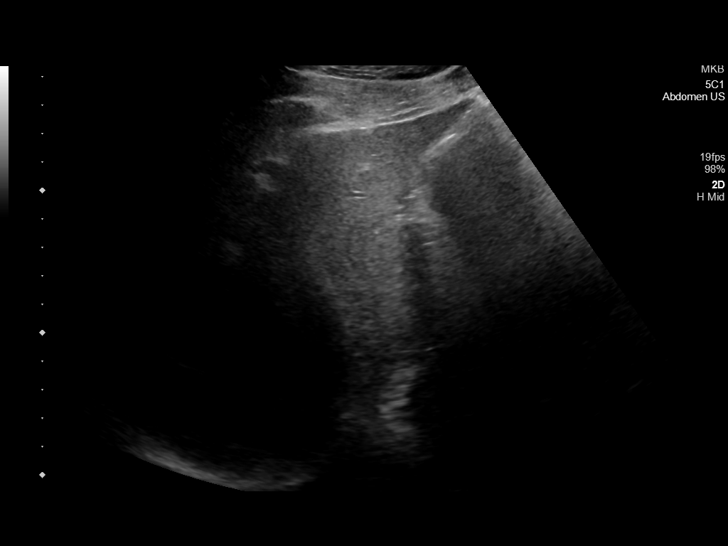
[im 27/49]
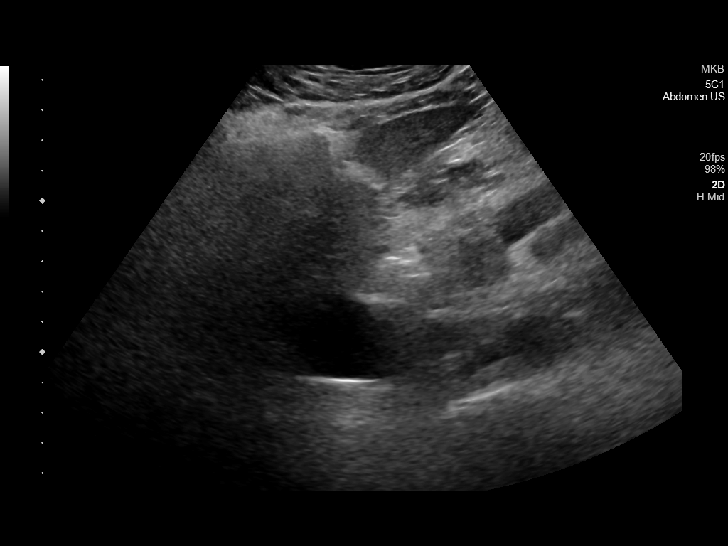
[im 31/49]
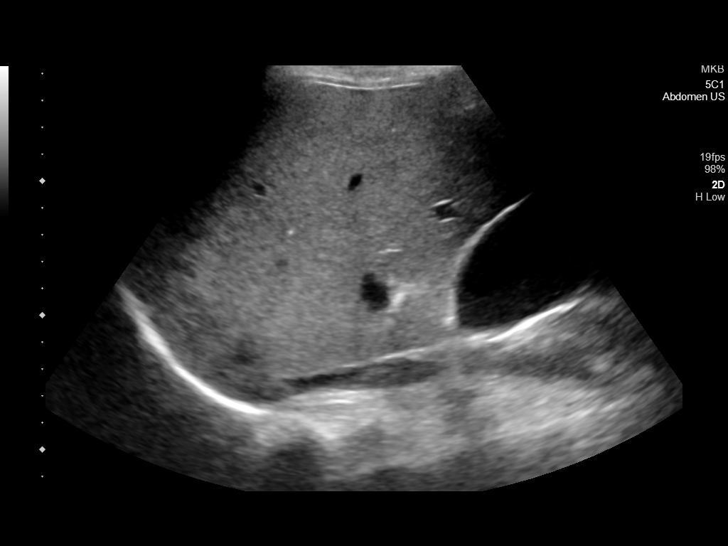
[im 33/49]
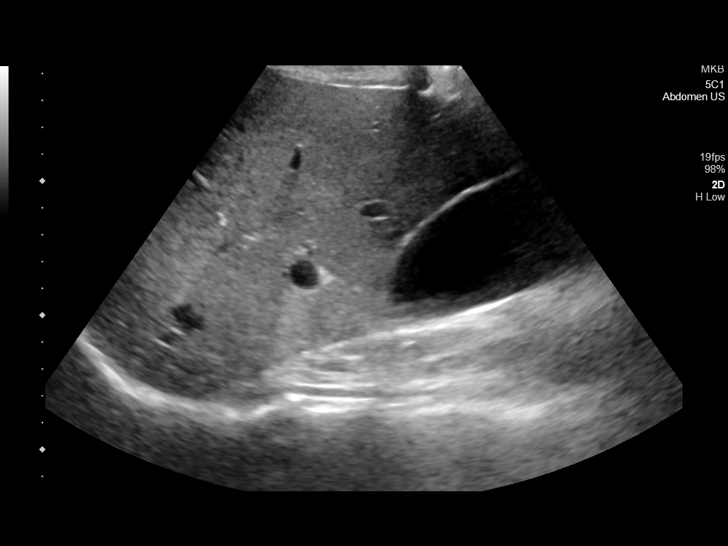
[im 37/49]
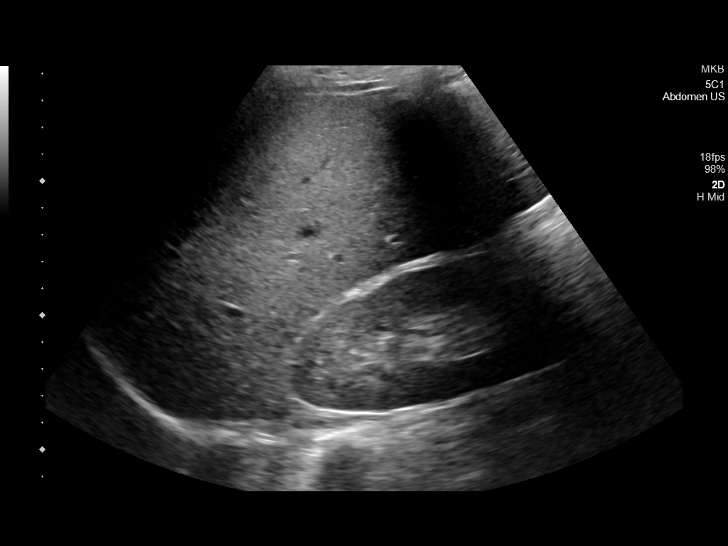
[im 41/49]
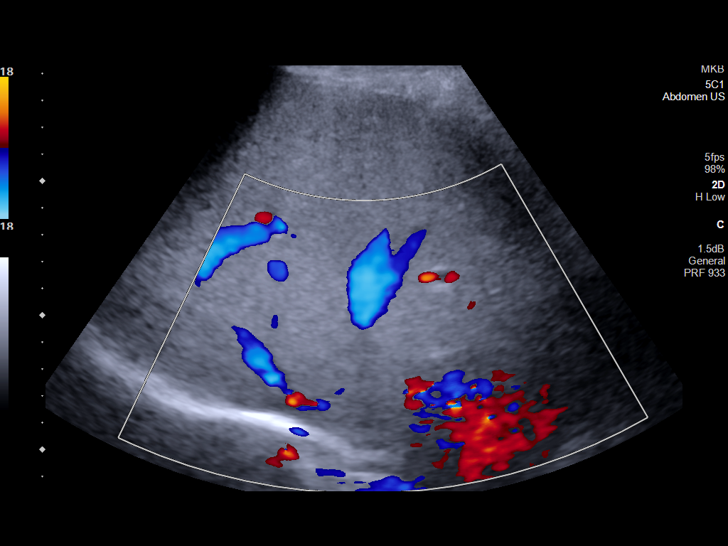
[im 45/49]
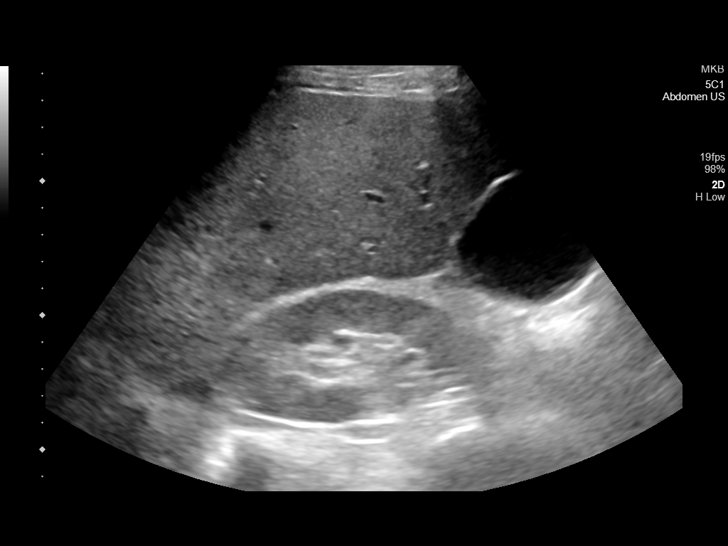
[im 49/49]
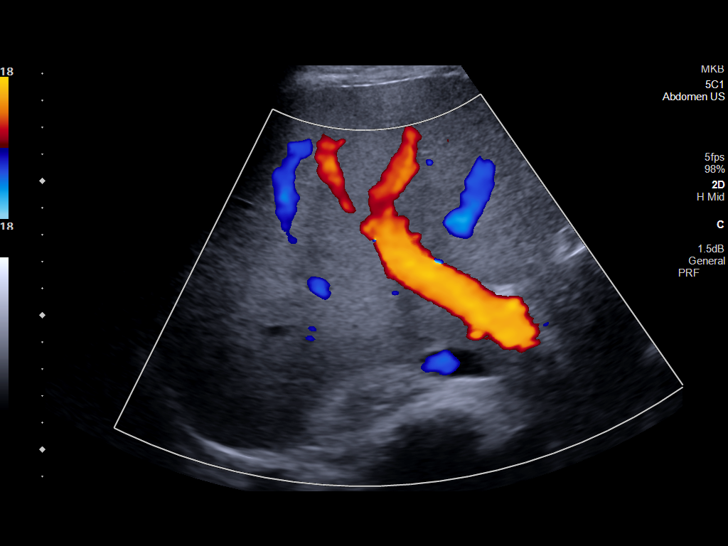

[14 of 25 positions shown; findings below may reference images not displayed]

FINDINGS: Gallbladder:

Gallbladder is distended without wall thickening or gallstones. No
pericholecystic fluid. Sonographer reports no sonographic Murphy
sign.

Common bile duct:

Diameter: 3 mm

Liver:

No focal lesion identified. Within normal limits in parenchymal
echogenicity. Portal vein is patent on color Doppler imaging with
normal direction of blood flow towards the liver.

Other: None.
IMPRESSION: No acute findings on right upper quadrant ultrasound.

## 2022-01-17 ENCOUNTER — Other Ambulatory Visit: Payer: Self-pay

## 2022-01-17 ENCOUNTER — Emergency Department: Payer: Medicaid Other

## 2022-01-17 ENCOUNTER — Inpatient Hospital Stay: Payer: Medicaid Other

## 2022-01-17 ENCOUNTER — Inpatient Hospital Stay
Admission: EM | Admit: 2022-01-17 | Discharge: 2022-01-28 | DRG: 286 | Payer: Medicaid Other | Attending: Internal Medicine | Admitting: Internal Medicine

## 2022-01-17 DIAGNOSIS — I10 Essential (primary) hypertension: Secondary | ICD-10-CM | POA: Diagnosis not present

## 2022-01-17 DIAGNOSIS — E875 Hyperkalemia: Secondary | ICD-10-CM | POA: Diagnosis present

## 2022-01-17 DIAGNOSIS — Z79899 Other long term (current) drug therapy: Secondary | ICD-10-CM | POA: Diagnosis not present

## 2022-01-17 DIAGNOSIS — J438 Other emphysema: Secondary | ICD-10-CM | POA: Diagnosis present

## 2022-01-17 DIAGNOSIS — J869 Pyothorax without fistula: Secondary | ICD-10-CM | POA: Diagnosis present

## 2022-01-17 DIAGNOSIS — F32A Depression, unspecified: Secondary | ICD-10-CM | POA: Diagnosis present

## 2022-01-17 DIAGNOSIS — J189 Pneumonia, unspecified organism: Principal | ICD-10-CM | POA: Diagnosis present

## 2022-01-17 DIAGNOSIS — I5023 Acute on chronic systolic (congestive) heart failure: Secondary | ICD-10-CM | POA: Diagnosis present

## 2022-01-17 DIAGNOSIS — D72829 Elevated white blood cell count, unspecified: Secondary | ICD-10-CM

## 2022-01-17 DIAGNOSIS — J90 Pleural effusion, not elsewhere classified: Secondary | ICD-10-CM | POA: Diagnosis not present

## 2022-01-17 DIAGNOSIS — I428 Other cardiomyopathies: Secondary | ICD-10-CM | POA: Diagnosis not present

## 2022-01-17 DIAGNOSIS — F1010 Alcohol abuse, uncomplicated: Secondary | ICD-10-CM | POA: Diagnosis not present

## 2022-01-17 DIAGNOSIS — Z8616 Personal history of COVID-19: Secondary | ICD-10-CM | POA: Diagnosis not present

## 2022-01-17 DIAGNOSIS — I340 Nonrheumatic mitral (valve) insufficiency: Secondary | ICD-10-CM | POA: Diagnosis present

## 2022-01-17 DIAGNOSIS — N179 Acute kidney failure, unspecified: Secondary | ICD-10-CM

## 2022-01-17 DIAGNOSIS — D649 Anemia, unspecified: Secondary | ICD-10-CM

## 2022-01-17 DIAGNOSIS — Z888 Allergy status to other drugs, medicaments and biological substances status: Secondary | ICD-10-CM | POA: Diagnosis not present

## 2022-01-17 DIAGNOSIS — I420 Dilated cardiomyopathy: Secondary | ICD-10-CM | POA: Diagnosis present

## 2022-01-17 DIAGNOSIS — E872 Acidosis, unspecified: Secondary | ICD-10-CM | POA: Diagnosis present

## 2022-01-17 DIAGNOSIS — Z9889 Other specified postprocedural states: Secondary | ICD-10-CM

## 2022-01-17 DIAGNOSIS — Z7189 Other specified counseling: Secondary | ICD-10-CM | POA: Diagnosis not present

## 2022-01-17 DIAGNOSIS — I959 Hypotension, unspecified: Secondary | ICD-10-CM | POA: Diagnosis not present

## 2022-01-17 DIAGNOSIS — I130 Hypertensive heart and chronic kidney disease with heart failure and stage 1 through stage 4 chronic kidney disease, or unspecified chronic kidney disease: Secondary | ICD-10-CM | POA: Diagnosis present

## 2022-01-17 DIAGNOSIS — F17210 Nicotine dependence, cigarettes, uncomplicated: Secondary | ICD-10-CM | POA: Diagnosis present

## 2022-01-17 DIAGNOSIS — Z91013 Allergy to seafood: Secondary | ICD-10-CM

## 2022-01-17 DIAGNOSIS — I272 Pulmonary hypertension, unspecified: Secondary | ICD-10-CM | POA: Diagnosis present

## 2022-01-17 DIAGNOSIS — Z20822 Contact with and (suspected) exposure to covid-19: Secondary | ICD-10-CM | POA: Diagnosis present

## 2022-01-17 DIAGNOSIS — R0781 Pleurodynia: Secondary | ICD-10-CM | POA: Clinically undetermined

## 2022-01-17 DIAGNOSIS — R570 Cardiogenic shock: Secondary | ICD-10-CM | POA: Diagnosis present

## 2022-01-17 DIAGNOSIS — R0609 Other forms of dyspnea: Secondary | ICD-10-CM | POA: Diagnosis not present

## 2022-01-17 DIAGNOSIS — F1020 Alcohol dependence, uncomplicated: Secondary | ICD-10-CM | POA: Diagnosis present

## 2022-01-17 DIAGNOSIS — I248 Other forms of acute ischemic heart disease: Secondary | ICD-10-CM | POA: Diagnosis present

## 2022-01-17 DIAGNOSIS — Z716 Tobacco abuse counseling: Secondary | ICD-10-CM

## 2022-01-17 DIAGNOSIS — J188 Other pneumonia, unspecified organism: Secondary | ICD-10-CM

## 2022-01-17 DIAGNOSIS — Z91199 Patient's noncompliance with other medical treatment and regimen due to unspecified reason: Secondary | ICD-10-CM

## 2022-01-17 DIAGNOSIS — Z515 Encounter for palliative care: Secondary | ICD-10-CM | POA: Diagnosis not present

## 2022-01-17 DIAGNOSIS — I2602 Saddle embolus of pulmonary artery with acute cor pulmonale: Secondary | ICD-10-CM | POA: Diagnosis not present

## 2022-01-17 DIAGNOSIS — I2699 Other pulmonary embolism without acute cor pulmonale: Secondary | ICD-10-CM | POA: Diagnosis present

## 2022-01-17 DIAGNOSIS — I2690 Septic pulmonary embolism without acute cor pulmonale: Secondary | ICD-10-CM | POA: Diagnosis not present

## 2022-01-17 DIAGNOSIS — I2510 Atherosclerotic heart disease of native coronary artery without angina pectoris: Secondary | ICD-10-CM | POA: Diagnosis present

## 2022-01-17 DIAGNOSIS — J918 Pleural effusion in other conditions classified elsewhere: Secondary | ICD-10-CM | POA: Diagnosis present

## 2022-01-17 DIAGNOSIS — R55 Syncope and collapse: Secondary | ICD-10-CM | POA: Diagnosis present

## 2022-01-17 DIAGNOSIS — K219 Gastro-esophageal reflux disease without esophagitis: Secondary | ICD-10-CM | POA: Diagnosis present

## 2022-01-17 DIAGNOSIS — F1021 Alcohol dependence, in remission: Secondary | ICD-10-CM | POA: Diagnosis present

## 2022-01-17 DIAGNOSIS — I509 Heart failure, unspecified: Secondary | ICD-10-CM | POA: Insufficient documentation

## 2022-01-17 DIAGNOSIS — Z91148 Patient's other noncompliance with medication regimen for other reason: Secondary | ICD-10-CM

## 2022-01-17 DIAGNOSIS — Z720 Tobacco use: Secondary | ICD-10-CM | POA: Diagnosis not present

## 2022-01-17 HISTORY — DX: Heart failure, unspecified: I50.9

## 2022-01-17 LAB — CBC
HCT: 28.7 % — ABNORMAL LOW (ref 39.0–52.0)
Hemoglobin: 9.4 g/dL — ABNORMAL LOW (ref 13.0–17.0)
MCH: 28.1 pg (ref 26.0–34.0)
MCHC: 32.8 g/dL (ref 30.0–36.0)
MCV: 85.9 fL (ref 80.0–100.0)
Platelets: 168 10*3/uL (ref 150–400)
RBC: 3.34 MIL/uL — ABNORMAL LOW (ref 4.22–5.81)
RDW: 18.5 % — ABNORMAL HIGH (ref 11.5–15.5)
WBC: 8.1 10*3/uL (ref 4.0–10.5)
nRBC: 1.4 % — ABNORMAL HIGH (ref 0.0–0.2)

## 2022-01-17 LAB — COOXEMETRY PANEL
Carboxyhemoglobin: 0.8 % (ref 0.5–1.5)
Methemoglobin: 0.9 % (ref 0.0–1.5)
O2 Saturation: 41.4 %
Total hemoglobin: 9.3 g/dL — ABNORMAL LOW (ref 12.0–16.0)
Total oxygen content: 40.7 mL/dL

## 2022-01-17 LAB — LACTIC ACID, PLASMA: Lactic Acid, Venous: 4.7 mmol/L (ref 0.5–1.9)

## 2022-01-17 LAB — BASIC METABOLIC PANEL
Anion gap: 12 (ref 5–15)
Anion gap: 18 — ABNORMAL HIGH (ref 5–15)
BUN: 30 mg/dL — ABNORMAL HIGH (ref 6–20)
BUN: 30 mg/dL — ABNORMAL HIGH (ref 6–20)
CO2: 23 mmol/L (ref 22–32)
CO2: 18 mmol/L — ABNORMAL LOW (ref 22–32)
Calcium: 8.6 mg/dL — ABNORMAL LOW (ref 8.9–10.3)
Calcium: 8.5 mg/dL — ABNORMAL LOW (ref 8.9–10.3)
Chloride: 101 mmol/L (ref 98–111)
Chloride: 100 mmol/L (ref 98–111)
Creatinine, Ser: 2.53 mg/dL — ABNORMAL HIGH (ref 0.61–1.24)
Creatinine, Ser: 2.4 mg/dL — ABNORMAL HIGH (ref 0.61–1.24)
GFR, Estimated: 32 mL/min — ABNORMAL LOW (ref >60)
GFR, Estimated: 34 mL/min — ABNORMAL LOW (ref >60)
Glucose, Bld: 115 mg/dL — ABNORMAL HIGH (ref 70–99)
Glucose, Bld: 124 mg/dL — ABNORMAL HIGH (ref 70–99)
Potassium: 6.1 mmol/L — ABNORMAL HIGH (ref 3.5–5.1)
Potassium: 6 mmol/L — ABNORMAL HIGH (ref 3.5–5.1)
Sodium: 136 mmol/L (ref 135–145)
Sodium: 136 mmol/L (ref 135–145)

## 2022-01-17 LAB — LIPASE, BLOOD: Lipase: 19 U/L (ref 11–51)

## 2022-01-17 LAB — RESPIRATORY PANEL BY PCR

## 2022-01-17 LAB — HEPATIC FUNCTION PANEL
ALT: 42 U/L (ref 0–44)
AST: 94 U/L — ABNORMAL HIGH (ref 15–41)
Albumin: 3.3 g/dL — ABNORMAL LOW (ref 3.5–5.0)
Alkaline Phosphatase: 314 U/L — ABNORMAL HIGH (ref 38–126)
Bilirubin, Direct: 1 mg/dL — ABNORMAL HIGH (ref 0.0–0.2)
Indirect Bilirubin: 0.8 mg/dL (ref 0.3–0.9)
Total Bilirubin: 1.8 mg/dL — ABNORMAL HIGH (ref 0.3–1.2)
Total Protein: 7.3 g/dL (ref 6.5–8.1)

## 2022-01-17 LAB — TROPONIN I (HIGH SENSITIVITY)
Troponin I (High Sensitivity): 28 ng/L — ABNORMAL HIGH (ref <18)
Troponin I (High Sensitivity): 29 ng/L — ABNORMAL HIGH (ref <18)

## 2022-01-17 LAB — MRSA NEXT GEN BY PCR, NASAL: MRSA by PCR Next Gen: NOT DETECTED

## 2022-01-17 LAB — RESP PANEL BY RT-PCR (FLU A&B, COVID) ARPGX2
Influenza A by PCR: NEGATIVE
Influenza B by PCR: NEGATIVE
SARS Coronavirus 2 by RT PCR: NEGATIVE

## 2022-01-17 LAB — MAGNESIUM: Magnesium: 2.3 mg/dL (ref 1.7–2.4)

## 2022-01-17 LAB — BRAIN NATRIURETIC PEPTIDE: B Natriuretic Peptide: 2253.2 pg/mL — ABNORMAL HIGH (ref 0.0–100.0)

## 2022-01-17 LAB — CREATININE, SERUM
Creatinine, Ser: 2.32 mg/dL — ABNORMAL HIGH (ref 0.61–1.24)
GFR, Estimated: 36 mL/min — ABNORMAL LOW (ref >60)

## 2022-01-17 LAB — PROCALCITONIN: Procalcitonin: 0.32 ng/mL

## 2022-01-17 MED ORDER — ACETAMINOPHEN 325 MG PO TABS
650.0000 mg | ORAL_TABLET | Freq: Four times a day (QID) | ORAL | Status: DC | PRN
  Administered 2022-01-18 – 2022-01-25 (×12): 650 mg via ORAL
  Filled 2022-01-17 (×12): qty 2

## 2022-01-17 MED ORDER — SODIUM CHLORIDE 0.9 % IV SOLN
500.0000 mg | Freq: Once | INTRAVENOUS | Status: AC
  Administered 2022-01-17: 500 mg via INTRAVENOUS
  Filled 2022-01-17: qty 5

## 2022-01-17 MED ORDER — HEPARIN SODIUM (PORCINE) 5000 UNIT/ML IJ SOLN
5000.0000 [IU] | Freq: Three times a day (TID) | INTRAMUSCULAR | Status: DC
Start: 2022-01-17 — End: 2022-01-19
  Administered 2022-01-17 – 2022-01-19 (×6): 5000 [IU] via SUBCUTANEOUS
  Filled 2022-01-17 (×6): qty 1

## 2022-01-17 MED ORDER — SODIUM ZIRCONIUM CYCLOSILICATE 10 G PO PACK
10.0000 g | PACK | ORAL | Status: AC
  Administered 2022-01-17: 10 g via ORAL
  Filled 2022-01-17: qty 1

## 2022-01-17 MED ORDER — THIAMINE HCL 100 MG PO TABS
100.0000 mg | ORAL_TABLET | Freq: Every day | ORAL | Status: DC
  Administered 2022-01-18 – 2022-01-28 (×10): 100 mg via ORAL
  Filled 2022-01-17 (×10): qty 1

## 2022-01-17 MED ORDER — NICOTINE 21 MG/24HR TD PT24
21.0000 mg | MEDICATED_PATCH | Freq: Every day | TRANSDERMAL | Status: DC
  Administered 2022-01-18 – 2022-01-25 (×8): 21 mg via TRANSDERMAL
  Filled 2022-01-17 (×10): qty 1

## 2022-01-17 MED ORDER — ALUM & MAG HYDROXIDE-SIMETH 200-200-20 MG/5ML PO SUSP
30.0000 mL | Freq: Once | ORAL | Status: AC
  Administered 2022-01-17: 30 mL via ORAL
  Filled 2022-01-17: qty 30

## 2022-01-17 MED ORDER — MILRINONE LACTATE IN DEXTROSE 20-5 MG/100ML-% IV SOLN
0.1250 ug/kg/min | INTRAVENOUS | Status: DC
  Filled 2022-01-17: qty 100

## 2022-01-17 MED ORDER — MORPHINE SULFATE (PF) 4 MG/ML IV SOLN
4.0000 mg | Freq: Once | INTRAVENOUS | Status: AC
  Administered 2022-01-17: 4 mg via INTRAVENOUS
  Filled 2022-01-17: qty 1

## 2022-01-17 MED ORDER — SODIUM CHLORIDE 0.9 % IV SOLN
2.0000 g | Freq: Once | INTRAVENOUS | Status: AC
  Administered 2022-01-17: 2 g via INTRAVENOUS
  Filled 2022-01-17: qty 20

## 2022-01-17 MED ORDER — SODIUM BICARBONATE 8.4 % IV SOLN
50.0000 meq | Freq: Once | INTRAVENOUS | Status: AC
  Administered 2022-01-17: 50 meq via INTRAVENOUS
  Filled 2022-01-17: qty 50

## 2022-01-17 MED ORDER — ACETAMINOPHEN 650 MG RE SUPP: 650.0000 mg | Freq: Four times a day (QID) | RECTAL | Status: DC | PRN

## 2022-01-17 MED ORDER — NOREPINEPHRINE 4 MG/250ML-% IV SOLN
0.0000 ug/min | INTRAVENOUS | Status: DC
  Administered 2022-01-17: 5 ug/min via INTRAVENOUS
  Filled 2022-01-17: qty 250

## 2022-01-17 MED ORDER — MILRINONE LACTATE IN DEXTROSE 20-5 MG/100ML-% IV SOLN
0.1250 ug/kg/min | INTRAVENOUS | Status: DC
  Administered 2022-01-17 – 2022-01-18 (×2): 0.25 ug/kg/min via INTRAVENOUS
  Administered 2022-01-19 – 2022-01-22 (×3): 0.125 ug/kg/min via INTRAVENOUS
  Filled 2022-01-17 (×5): qty 100

## 2022-01-17 MED ORDER — NOREPINEPHRINE 4 MG/250ML-% IV SOLN: 0.0000 ug/min | INTRAVENOUS | Status: DC

## 2022-01-17 MED ORDER — NOREPINEPHRINE 4 MG/250ML-% IV SOLN: 2.0000 ug/min | INTRAVENOUS | Status: DC

## 2022-01-17 MED ORDER — FUROSEMIDE 10 MG/ML IJ SOLN
80.0000 mg | Freq: Once | INTRAMUSCULAR | Status: AC
  Administered 2022-01-17: 80 mg via INTRAVENOUS
  Filled 2022-01-17: qty 8

## 2022-01-17 MED ORDER — SODIUM CHLORIDE 0.9% FLUSH
3.0000 mL | Freq: Two times a day (BID) | INTRAVENOUS | Status: DC
  Administered 2022-01-17 – 2022-01-28 (×19): 3 mL via INTRAVENOUS

## 2022-01-17 MED ORDER — ONDANSETRON HCL 4 MG/2ML IJ SOLN
4.0000 mg | Freq: Four times a day (QID) | INTRAMUSCULAR | Status: DC | PRN
  Administered 2022-01-17: 4 mg via INTRAVENOUS
  Filled 2022-01-17: qty 2

## 2022-01-17 MED ORDER — FUROSEMIDE 10 MG/ML IJ SOLN
80.0000 mg | Freq: Once | INTRAMUSCULAR | Status: DC
Start: 2022-01-17 — End: 2022-01-17
  Filled 2022-01-17: qty 8

## 2022-01-17 MED ORDER — SODIUM CHLORIDE 0.9 % IV SOLN
250.0000 mL | INTRAVENOUS | Status: DC
  Administered 2022-01-17 – 2022-01-23 (×4): 250 mL via INTRAVENOUS

## 2022-01-17 MED ORDER — DEXTROSE 50 % IV SOLN
2.0000 | Freq: Once | INTRAVENOUS | Status: AC
  Administered 2022-01-17: 100 mL via INTRAVENOUS
  Filled 2022-01-17: qty 100

## 2022-01-17 MED ORDER — FENTANYL CITRATE PF 50 MCG/ML IJ SOSY
25.0000 ug | PREFILLED_SYRINGE | INTRAMUSCULAR | Status: AC | PRN
  Administered 2022-01-17: 25 ug via INTRAVENOUS
  Administered 2022-01-18: 50 ug via INTRAVENOUS
  Administered 2022-01-18: 25 ug via INTRAVENOUS
  Filled 2022-01-17 (×3): qty 1

## 2022-01-17 MED ORDER — CHLORHEXIDINE GLUCONATE CLOTH 2 % EX PADS
6.0000 | MEDICATED_PAD | Freq: Every day | CUTANEOUS | Status: DC
  Administered 2022-01-19 – 2022-01-21 (×2): 6 via TOPICAL

## 2022-01-17 MED ORDER — POLYETHYLENE GLYCOL 3350 17 G PO PACK: 17.0000 g | PACK | Freq: Every day | ORAL | Status: DC | PRN

## 2022-01-17 MED ORDER — INSULIN ASPART 100 UNIT/ML IJ SOLN
10.0000 [IU] | Freq: Once | INTRAMUSCULAR | Status: AC
Start: 2022-01-17 — End: 2022-01-17
  Administered 2022-01-17: 10 [IU] via INTRAVENOUS
  Filled 2022-01-17: qty 1

## 2022-01-17 MED ORDER — SODIUM CHLORIDE 0.9 % IV SOLN: INTRAVENOUS | Status: AC

## 2022-01-17 MED ORDER — CALCIUM GLUCONATE 10 % IV SOLN
1.0000 g | Freq: Once | INTRAVENOUS | Status: AC
Start: 2022-01-17 — End: 2022-01-17
  Administered 2022-01-17: 1 g via INTRAVENOUS
  Filled 2022-01-17: qty 10

## 2022-01-17 MED ORDER — ENOXAPARIN SODIUM 40 MG/0.4ML IJ SOSY: 40.0000 mg | PREFILLED_SYRINGE | INTRAMUSCULAR | Status: DC

## 2022-01-17 NOTE — ED Notes (Signed)
Pt reports nausea has improved with medication. Pt resting comfortably in bed, NAD. No needs verbalized at this time. Bed low & locked; call light & personal items within reach. ?

## 2022-01-17 NOTE — Progress Notes (Addendum)
? ?    CROSS COVER NOTE ? ?NAME: Mario Beck ?MRN: [id number] ?DOB : 12-27-1980 ? ? ?Subjective: Secure chat received from nursing reporting progressive hypotension on day shift.  ? ?Objective: ? ?Today's Vitals  ? 01/17/22 2045 01/17/22 2100 01/17/22 2115 01/17/22 2200  ?BP: 96/72 93/81 97/77  100/73  ?Pulse:  94 93 93  ?Resp: (!) 26 (!) 24 (!) 25 (!) 26  ?Temp:    (!) 96.8 ?F (36 ?C)  ?TempSrc:    Oral  ?SpO2:  97% 96% 100%  ?Weight:    95.7 kg  ?Height:    6\' 3"  (1.905 m)  ?PainSc:    0-No pain  ? ?Body mass index is 26.37 kg/m?.  ? ?DG Chest 2 View ? ?Result Date: 01/17/2022 ?CLINICAL DATA:  41 year old male with history of shortness of breath and chest pain. EXAM: CHEST - 2 VIEW COMPARISON:  Chest x-ray 12/08/2021. FINDINGS: New moderate left pleural effusion. Atelectasis and/or consolidation in the base of the left lung. Right lung is clear. No right pleural effusion. No pneumothorax. No evidence of pulmonary edema. Heart size is normal. Upper mediastinal contours are within normal limits. IMPRESSION: 1. New moderate left pleural effusion with atelectasis and/or consolidation in the left lung base. Electronically Signed   By: Vinnie Langton M.D.   On: 01/17/2022 09:43  ? ?CT Chest Wo Contrast ? ?Result Date: 01/17/2022 ?CLINICAL DATA:  Abnormal chest x-ray EXAM: CT CHEST WITHOUT CONTRAST TECHNIQUE: Multidetector CT imaging of the chest was performed following the standard protocol without IV contrast. RADIATION DOSE REDUCTION: This exam was performed according to the departmental dose-optimization program which includes automated exposure control, adjustment of the mA and/or kV according to patient size and/or use of iterative reconstruction technique. COMPARISON:  CT chest angio dated December 08, 2021 FINDINGS: Cardiovascular: Marked cardiomegaly. No pericardial effusion. Coronary artery calcifications of the LAD. Minimal calcified plaque of the thoracic aorta. Mediastinum/Nodes: Esophagus and thyroid are  unremarkable. New mildly enlarged mediastinal lymph nodes, likely reactive. Reference pre-vascular lymph node measuring 1.1 cm in short axis on series 3, image 57. Lungs/Pleura: Central airways are patent. Mild paraseptal emphysema. Peripheral predominant ground-glass opacities of the lower lungs. Small left pleural effusion with atelectasis. Bilateral pulmonary nodules-stable in size when compared with prior exam dating back to June 17, 2021. Largest solid nodule measures 7.8 mm in mean diameter and is located in the right upper lobe on series 4 image 46 and is part of a linear cluster of additional nodules Upper Abdomen: No acute abnormality. Musculoskeletal: No chest wall mass or suspicious bone lesions identified. IMPRESSION: 1. New peripheral and lower lung predominant patchy ground-glass opacities, findings can be seen in the setting of multifocal infection, including COVID-19. 2. New small left pleural effusion with atelectasis. 3. New mildly enlarged mediastinal lymph nodes, likely reactive. 4. Bilateral solid pulmonary nodules, largest measures 7.8 mm in mean diameter and is located in the right upper lobe. Nodules have been stable when compared with prior exam dated back to June 17, 2021, but are new when compared with more remote priors. Additional CT follow-up at 18-24 months (from today's scan) is considered optional for low-risk patients, but is recommended for high-risk patients. This recommendation follows the consensus statement: Guidelines for Management of Incidental Pulmonary Nodules Detected on CT Images: From the Fleischner Society 2017; Radiology 2017; 284:228-243. 5. Marked cardiomegaly and Emphysema (ICD10-J43.9). Electronically Signed   By: Yetta Glassman M.D.   On: 01/17/2022 12:09  ? ?US RENAL ? ?Result  Date: 01/17/2022 ?CLINICAL DATA:  A 41 year old male presents with history of acute kidney injury. EXAM: RENAL / URINARY TRACT ULTRASOUND COMPLETE COMPARISON:  CT of the abdomen  and pelvis from December 08, 2021. FINDINGS: Right Kidney: Renal measurements: 11.3 x 5.1 x 4.5 cm = volume: 136 mL. Trace perinephric fluid or edema adjacent to the lower pole the RIGHT kidney. No hydronephrosis. No parenchymal thinning. Mild increased cortical echogenicity and decreased corticomedullary differentiation. Left Kidney: Renal measurements: 11.7 x 5.8 x 5.3 cm = volume: 189 mL. Decreased corticomedullary differentiation. Question of mild increased cortical echogenicity. No hydronephrosis. No perinephric fluid. Bladder: Urinary bladder is under distended limiting assessment. Other: Gallbladder under distended. The wall thickening up to 9 mm. Trace pericholecystic fluid. Liver contour mildly nodular, parenchymal pattern mildly heterogeneous. LEFT pleural effusion. Adrenal nodules better seen on recent CT imaging. IMPRESSION: No hydronephrosis. Decreased corticomedullary differentiation and increased cortical echogenicity in keeping with diagnosis of medical renal disease. Trace perinephric fluid on the RIGHT also likely related to this process. Gallbladder wall thickening and small amount of pericholecystic fluid. This may relate to cardiogenic edema or underlying liver disease. Correlate with any pain in this area with further imaging as warranted. Areas not fully assessed. LEFT effusion further supporting the possibility of heart failure or volume overload. Parenchymal changes of the liver that suggests underlying liver disease, perhaps cardiogenic. 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 Could consider 1 year follow-up with CT utilizing adrenal protocol. Electronically Signed   By: Zetta Bills M.D.   On: 01/17/2022 16:19  ? ?DG Chest Port 1 View ? ?Result Date: 01/17/2022 ?CLINICAL DATA:  Central line EXAM: PORTABLE CHEST 1 VIEW COMPARISON:  01/17/2022 chest x-ray and CT FINDINGS: Right  IJ central venous catheter tip over the SVC origin. No pneumothorax. Mild cardiomegaly with small moderate left effusion and airspace disease at the left base IMPRESSION: 1. Right IJ central venous catheter tip over the SVC origin. No pneumothorax 2. Mild cardiomegaly with left effusion and left basilar airspace disease. Electronically Signed   By: Donavan Foil M.D.   On: 01/17/2022 23:11    ? ?CBC ?   ?Component Value Date/Time  ? WBC 8.1 01/17/2022 0925  ? RBC 3.34 (L) 01/17/2022 0925  ? HGB 9.4 (L) 01/17/2022 0925  ? HCT 28.7 (L) 01/17/2022 0925  ? PLT 168 01/17/2022 0925  ? MCV 85.9 01/17/2022 0925  ? MCH 28.1 01/17/2022 0925  ? MCHC 32.8 01/17/2022 0925  ? RDW 18.5 (H) 01/17/2022 0925  ? LYMPHSABS 2.9 06/21/2021 0435  ? MONOABS 0.7 06/21/2021 0435  ? EOSABS 0.1 06/21/2021 0435  ? BASOSABS 0.1 06/21/2021 0435  ? ? ?  Latest Ref Rng & Units 01/17/2022  ?  7:32 PM 01/17/2022  ?  3:06 PM 01/17/2022  ?  9:25 AM  ?BMP  ?Glucose 70 - 99 mg/dL 124    115    ?BUN 6 - 20 mg/dL 30    30    ?Creatinine 0.61 - 1.24 mg/dL 2.40   2.32   2.53    ?Sodium 135 - 145 mmol/L 136    136    ?Potassium 3.5 - 5.1 mmol/L 6.0    6.1    ?Chloride 98 - 111 mmol/L 100    101    ?CO2 22 - 32 mmol/L 18    23    ?Calcium 8.9 - 10.3 mg/dL 8.5  8.6    ? ?Lactic Acid, Venous ?   ?Component Value Date/Time  ? LATICACIDVEN 4.7 ([place]) 01/17/2022 1506  ?  ? ?Physical Assessment:  ? ?General: Adult male, lying in bed, NAD ?HEENT: MM pink/moist, elevated JVP ?Neuro: A&O x 4, follows commands, PERRL  ?CV: s1s2 ,RRR, NSR on monitor, no r/m/g ?Pulm: Regular, non labored on room air, breath sounds clear-BUL & fine crackles-BLL ?GI: soft, non-distended, non-tender, bs active x 4 ?Skin: no rashes/lesions noted ?Extremities: cool, pulses + 2 R/P, trace pedal edema ? ?Plan: ?Cardiogenic Shock ?- Lactic 4.7, Creatinine 2.40, EF 06/2021 <20% ?- Stop IVF ?- Cardiology consulted, Dr Fletcher Anon recommends initiating Milrinone 0.19mcg/kg/min, low dose levophed, and ICU  admission ?- PCCM consulted and will take over care of patient ? ? ?Neomia Glass MHA, MSN, FNP-BC ?Nurse Practitioner ?[facility] ?[place] ?Pager [phone] ? ?

## 2022-01-17 NOTE — ED Notes (Signed)
Secure msg sent to Bishop Limbo, NP re: BP's trending down, MAP decreasing & previous shift secure chat conversation. Charge RN, Erie Noe made aware in person. ?

## 2022-01-17 NOTE — Assessment & Plan Note (Addendum)
Last drink 8 days prior to admission, recently detoxed in [place] and had been at a rehab facility as well.   ?Denies any alcohol consumption since.  No signs of withdrawal.   ?Stop CIWA.  Stable. ? ?

## 2022-01-17 NOTE — ED Notes (Signed)
Secure msg sent to Dr. Nelson Chimes re: pt's BP's trending down. ?

## 2022-01-17 NOTE — ED Notes (Signed)
See triage note  presents with some SOB and weakness  states  he was told to increase his Lasix for 3 days  now he feels dizzy and SOB on exertion ?

## 2022-01-17 NOTE — Assessment & Plan Note (Addendum)
Admitted with cardiogenic shock, required Levophed. ?Home regimen metoprolol 100 mg daily, isosorbide, hydralazine 25 mg 3 times a day and Lasix 40 mg twice daily at home. ?--Now off milrinone ?-- On Coreg, Lasix, losartan, spiro ?-- Cardiology following ? ?

## 2022-01-17 NOTE — ED Notes (Signed)
Dinner tray delivered to pt by dietary services. °

## 2022-01-17 NOTE — ED Notes (Signed)
Lavender & light green top tubes sent to lab. 

## 2022-01-17 NOTE — ED Notes (Addendum)
Per Dr. Reesa Chew, "Just monitor, Dr. Fletcher Anon was supposed to see him, you can also ask him if any concerns". Dr. Fletcher Anon added to secure chat conversation. ? ?

## 2022-01-17 NOTE — ED Triage Notes (Signed)
Pt c/o SOB with "fluid retention" for the past several days and seen and given lasix, seen yesterday in [place], states he had 2 episodes of syncope.  ?

## 2022-01-17 NOTE — ED Notes (Signed)
Per secure chat, Bishop Limbo, NP to come evaluate pt. ?

## 2022-01-17 NOTE — Assessment & Plan Note (Addendum)
Resolved.  POA with potassium 6.1 in the setting of worsening renal function and potassium supplementation at home with his diuretic.  No EKG changes.   ?K today 4.3 ?-- Monitor BMP ?

## 2022-01-17 NOTE — ED Notes (Addendum)
Secure msg sent to Lendon Ka, RN for ED to IP SBAR. ?

## 2022-01-17 NOTE — ED Notes (Signed)
COVID swab sent to lab. Diet ginger ale given to pt, per request. ? ?Still awaiting orders for nausea medication. ?

## 2022-01-17 NOTE — H&P (Signed)
? ?NAME:  Mario Beck, MRN:  [id number], DOB:  April 03, 1981, LOS: 0 ?ADMISSION DATE:  01/17/2022, CONSULTATION DATE:  4/3 ?REFERRING MD:  Nelson Chimes, CHIEF COMPLAINT:  Dyspnea  ? ?History of Present Illness:  ?This is a pleasant 41 yo male with a history of cardiomyopathy presented to the [facility] ER on 4/3 complaining of 3-4 days of dyspnea, fatigue and weight gain. He says that he has been compliant with his medications which include furosemide, metoprolol, nexium, potassium and sertraline.  However he says that the lasix has not been working for him in the last week or so and his urine output has become very minimal and dark.  He has noted increasing dyspnea when he climbs a flight of stairs in his apartment and yesterday passed out twice when he was walking across the parking lot.   ?He was admitted to [facility] for alcohol detox on 3/21, treated with lithium, and discharged on 3/24.  He says that he has been compliant with his medications. ?He was supposed to see Dr. Welton Flakes with cardiology after his last discharge for alcoholic pancreatitis, acute decompensated CHF in 06/2021, but he missed his appointments and has not followed up.  ?He smokes 1 pack of cigarettes daily.  He quit drinking on 3/21 and has not had alcohol since then.  He denies illicit drug use. ? 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 ?Significant Hospital Events: ?Including procedures, antibiotic start and stop dates in addition to other pertinent events   ?4/3 admission for multi-organ failure from presumed cardiogenic shock ? ?Interim History / Subjective:  ?As above ? ?Objective   ?Blood pressure (!) 89/54, pulse 66, temperature 97.8 ?F (36.6 ?C), temperature source Oral, resp. rate (!) 30, height 6\' 3"  (1.905 m), weight 88.5 kg, SpO2 100 %. ?    ?   ? ?Intake/Output Summary (Last 24 hours) at 01/17/2022 2021 ?Last data filed at 01/17/2022 1637 ?Gross per 24 hour  ?Intake 254.44 ml  ?Output --  ?Net 254.44 ml  ? ?Filed Weights  ? 01/17/22 0849  ?Weight: 88.5 kg  ? ? ?Examination: ? ?General:  Resting comfortably in bed ?HENT: NCAT OP clear, JVP elevated ?PULM: Scant crackles in bases bilaterally, normal effort ?CV: RRR, no mgr ?GI: BS+, soft, nontender ?MSK: normal bulk and tone ?Derm: edema in ankles, cool to the touch around ankles/hands ?Neuro: awake, alert, no distress, MAEW ? ? ?Resolved Hospital Problem list   ? ? ?Assessment & Plan:  ?Cardiogenic shock due to acute decompensated systolic heart failure; cause is uncertain but most likely inadequate medical management given history of non-compliance ?Admit to ICU ?Place central line ?Check Coox ?Start levophed and milrinone after checking coox ?Repeat coox in AM ?Check CVP ?Stop IV fluids ?Start IV lasix ?Depending on clinical response to vasopressors ?Cardiology consult ?Echo/heart catheterization decision for cardiology ?Repeat lactic acid in AM ? ?AKI due to cardiogenic shock ?As above ?Monitor BMET and UOP ?Replace electrolytes as needed ? ?Alcohol abuse, in remission ?Monitor for withdrawal ? ?Depression ?Zoloft  ? ?Cigarette smoker ?Counsel to quit ? ?Best Practice (right click and "Reselect all SmartList Selections" daily)  ? ?Diet/type: Regular consistency (see orders) ?DVT prophylaxis: prophylactic heparin  ?GI prophylaxis: N/A ?Lines: Central line and yes and it is still needed ?Foley:  N/A ?Code Status:  full code ?Last date of multidisciplinary goals  of care discussion [4/3] ? ?Labs   ?CBC: ?Recent Labs  ?Lab 01/17/22 ?0925  ?WBC 8.1  ?HGB 9.4*  ?HCT 28.7*  ?MCV 85.9  ?PLT 168  ? ? ?Basic Metabolic Panel: ?Recent Labs  ?Lab 01/17/22 ?0925 01/17/22 ?1506 01/17/22 ?1932  ?NA 136  --  136  ?K 6.1*  --  6.0*  ?CL 101  --  100  ?CO2 23  --  18*  ?GLUCOSE 115*  --  124*  ?BUN 30*  --  30*   ?CREATININE 2.53* 2.32* 2.40*  ?CALCIUM 8.6*  --  8.5*  ?MG 2.3  --   --   ? ?GFR: ?Estimated Creatinine Clearance: 49.4 mL/min (A) (by C-G formula based on SCr of 2.4 mg/dL (H)). ?Recent Labs  ?Lab 01/17/22 ?0925 01/17/22 ?1506  ?WBC 8.1  --   ?LATICACIDVEN  --  4.7*  ? ? ?Liver Function Tests: ?Recent Labs  ?Lab 01/17/22 ?1932  ?AST 94*  ?ALT 42  ?ALKPHOS 314*  ?BILITOT 1.8*  ?PROT 7.3  ?ALBUMIN 3.3*  ? ?Recent Labs  ?Lab 01/17/22 ?0925  ?LIPASE 19  ? ?No results for input(s): AMMONIA in the last 168 hours. ? ?ABG ?No results found for: PHART, PCO2ART, PO2ART, HCO3, TCO2, ACIDBASEDEF, O2SAT  ? ?Coagulation Profile: ?No results for input(s): INR, PROTIME in the last 168 hours. ? ?Cardiac Enzymes: ?No results for input(s): CKTOTAL, CKMB, CKMBINDEX, TROPONINI in the last 168 hours. ? ?HbA1C: ?Hgb A1c MFr Bld  ?Date/Time Value Ref Range Status  ?09/17/2020 05:14 AM 6.0 (H) 4.8 - 5.6 % Final  ?  Comment:  ?  (NOTE) ?Pre diabetes:          5.7%-6.4% ? ?Diabetes:              >6.4% ? ?Glycemic control for   <7.0% ?adults with diabetes ?  ? ? ?CBG: ?No results for input(s): GLUCAP in the last 168 hours. ? ?Review of Systems:   ?Gen: Denies fever, chills, weight change, fatigue, night sweats ?HEENT: Denies blurred vision, double vision, hearing loss, tinnitus, sinus congestion, rhinorrhea, sore throat, neck stiffness, dysphagia ?PULM: per HPI ?CV: per HPI ?GI: Denies abdominal pain, nausea, vomiting, diarrhea, hematochezia, melena, constipation, change in bowel habits ?GU: Denies dysuria, hematuria, polyuria, oliguria, urethral discharge ?Endocrine: Denies hot or cold intolerance, polyuria, polyphagia or appetite change ?Derm: Denies rash, dry skin, scaling or peeling skin change ?Heme: Denies easy bruising, bleeding, bleeding gums ?Neuro: Denies headache, numbness, weakness, slurred speech, loss of memory or consciousness ? ? ?Past Medical History:  ?He,  has a past medical history of Alcohol abuse, Asthma, CHF  (congestive heart failure) (HCC), Depression, and Hypertension.  ? ?Surgical History:  ? ?Past Surgical History:  ?Procedure Laterality Date  ? FRACTURE SURGERY    ? Left leg as a kid  ?  ? ?Social History:  ? reports that he has been smoking cigarettes. He has been smoking an average of 1 pack per day. His smokeless tobacco use includes snuff. He reports that he does not currently use alcohol after a past usage of about 7.0 standard drinks per week. He reports that he does not use drugs.  ? ?Family History:  ?His family history is negative for Colon cancer and Esophageal cancer.  ? ?Allergies ?Allergies  ?Allergen Reactions  ? Lisinopril Swelling  ?  Angioedema ?  ? Shellfish Allergy Swelling  ?  ? ?Home Medications  ?Prior to Admission medications   ?Medication Sig Start Date End Date Taking? Authorizing  Provider  ?furosemide (LASIX) 40 MG tablet Take 1 tablet (40 mg total) by mouth once daily. 12/08/21 03/08/22 Yes Gilles Chiquito, MD  ?metoprolol succinate (TOPROL-XL) 100 MG 24 hr tablet Take 1 tablet (100 mg total) by mouth once daily. (Take with or immediately following a meal). 06/22/21  Yes Danford, Earl Lites, MD  ?Multiple Vitamin (MULTIVITAMIN WITH MINERALS) TABS tablet Take 1 tablet by mouth daily. 05/30/21  Yes Arnetha Courser, MD  ?potassium chloride SA (KLOR-CON M) 20 MEQ tablet Take 2 tablets (40 mEq total) by mouth once daily. 12/08/21  Yes Gilles Chiquito, MD  ?albuterol (VENTOLIN HFA) 108 (90 Base) MCG/ACT inhaler Inhale 2 puffs into the lungs once every 6 (six) hours as needed for wheezing or shortness of breath. ?Patient not taking: Reported on 01/17/2022 06/21/21   Alberteen Sam, MD  ?benzonatate (TESSALON) 100 MG capsule Take 2 capsules (200 mg total) by mouth 3 (three) times daily as needed for cough for 10 days. ?Patient not taking: Reported on 01/17/2022 06/21/21   Alberteen Sam, MD  ?hydrALAZINE (APRESOLINE) 25 MG tablet Take 1 tablet (25 mg total) by mouth 3 (three) times daily for  2 days. 06/21/21 06/23/21  DanfordEarl Lites, MD  ?hydrALAZINE (APRESOLINE) 25 MG tablet Take 1 tablet (25 mg total) by mouth 3 (three) times daily. ?Patient not taking: Reported on 01/17/2022 06/21/21 06/21/22  Danford, Salena Saner

## 2022-01-17 NOTE — Assessment & Plan Note (Addendum)
On admission, patient reported feeling dizzy and someone was able to hold him before falling down.  No chest pain.  Recently increased Lasix.  Patient has very low EF.  Due to poor cardiac output and hypotension.  No recurrence reported since admission and BP improved. ?

## 2022-01-17 NOTE — Assessment & Plan Note (Addendum)
Counseled regarding importance of cessation.  Nicotine patch ordered. ?

## 2022-01-17 NOTE — ED Notes (Signed)
Per Dr. [name], cardiology team will be seeing pt tomorrow. ? ?Per Dr. Nelson Chimes, "If he has persistent hypotension, please contact night on call as he might need pressors" ?

## 2022-01-17 NOTE — H&P (Signed)
?History and Physical  ? ? ?Patient: Mario Beck DOB: 19-Jan-1981 ?DOA: 01/17/2022 ?DOS: the patient was seen and examined on 01/17/2022 ?PCP: Pcp, No  ?Patient coming from: Home ? ?Chief Complaint:  ?Chief Complaint  ?Patient presents with  ? Shortness of Breath  ? ?HPI: Mario Beck is a 41 y.o. male with medical history significant of dilated cardiomyopathy with last EF of less than 20%, hypertension, asthma, alcohol abuse and depression came to ED with complaint of worsening shortness of breath and dizziness. ?Patient had 3 syncopal episode yesterday, feeling dizzy/lightheaded before falling, no injuries as somebody was nearby and able to help him before hitting the ground. ? ?Patient was recently seen in [facility] ED for worsening shortness of breath and was told to increase the home dose of Lasix, patient was taking twice daily dose for the past 2 days.  Patient was also having some cough for the past few days but denies any fever or chills.  Denies any recent illnesses.  Denies any sick contact.  No chest pain, orthopnea or PND.  Per patient his weight is up about 5 pounds. ? ?Patient was seen multiple times in ED for different reasons which include shortness of breath.  He was diagnosed with HFrEF since 2021.  No cardiology work-up or follow-up. ?Per patient he is very busy with his job so never followed up. ? ?No recent change in his appetite or bowel habits. ? ?No urinary symptoms. ? ?ED course.  Hemodynamically stable, afebrile, no leukocytosis, labs pertinent for potassium of 6.1, creatinine of 2.32 with baseline around 1-1.1.  Lactic acid of 4.4. chest x-ray with a new left pleural effusion with atelectasis and or consolidation in the left lung base. ?CT chest without contrast with new peripheral and lower lung predominantly patchy groundglass opacities, concerning for multifocal infection, including COVID-19.  COVID-19 PCR pending.  There was also a new small left pleural effusion with  atelectasis and new mildly enlarged mediastinal lymph nodes likely reactive. ?There was also noted bilateral solid pulmonary nodules which seems stable as compared to the prior examinations in September.  Recommending follow-up CT in 18 to 24 months. ?EKG without any significant changes. ?Patient received calcium gluconate, insulin and Lokelma in ED. ?He was given a dose of ceftriaxone and Zithromax for concern of CAP in ED ? ? ?Review of Systems: As mentioned in the history of present illness. All other systems reviewed and are negative. ?Past Medical History:  ?Diagnosis Date  ? Alcohol abuse   ? Asthma   ? CHF (congestive heart failure) ([place])   ? Depression   ? Hypertension   ? ?Past Surgical History:  ?Procedure Laterality Date  ? FRACTURE SURGERY    ? Left leg as a kid  ? ?Social History:  reports that he has been smoking cigarettes. He has been smoking an average of 1 pack per day. His smokeless tobacco use includes snuff. He reports that he does not currently use alcohol after a past usage of about 7.0 standard drinks per week. He reports that he does not use drugs. ? ?Allergies  ?Allergen Reactions  ? Lisinopril Swelling  ?  Angioedema ?  ? Shellfish Allergy Swelling  ? ? ?Family History  ?Problem Relation Age of Onset  ? Colon cancer Neg Hx   ? Esophageal cancer Neg Hx   ? ? ?Prior to Admission medications   ?Medication Sig Start Date End Date Taking? Authorizing Provider  ?furosemide (LASIX) 40 MG tablet Take 1 tablet (  40 mg total) by mouth once daily. 12/08/21 03/08/22 Yes Lucrezia Starch, MD  ?metoprolol succinate (TOPROL-XL) 100 MG 24 hr tablet Take 1 tablet (100 mg total) by mouth once daily. (Take with or immediately following a meal). 06/22/21  Yes Danford, Suann Larry, MD  ?Multiple Vitamin (MULTIVITAMIN WITH MINERALS) TABS tablet Take 1 tablet by mouth daily. 05/30/21  Yes Lorella Nimrod, MD  ?potassium chloride SA (KLOR-CON M) 20 MEQ tablet Take 2 tablets (40 mEq total) by mouth once daily. 12/08/21   Yes Lucrezia Starch, MD  ?albuterol (VENTOLIN HFA) 108 (90 Base) MCG/ACT inhaler Inhale 2 puffs into the lungs once every 6 (six) hours as needed for wheezing or shortness of breath. ?Patient not taking: Reported on 01/17/2022 06/21/21   Edwin Dada, MD  ?benzonatate (TESSALON) 100 MG capsule Take 2 capsules (200 mg total) by mouth 3 (three) times daily as needed for cough for 10 days. ?Patient not taking: Reported on 01/17/2022 06/21/21   Edwin Dada, MD  ?hydrALAZINE (APRESOLINE) 25 MG tablet Take 1 tablet (25 mg total) by mouth 3 (three) times daily for 2 days. 06/21/21 06/23/21  DanfordSuann Larry, MD  ?hydrALAZINE (APRESOLINE) 25 MG tablet Take 1 tablet (25 mg total) by mouth 3 (three) times daily. ?Patient not taking: Reported on 01/17/2022 06/21/21 06/21/22  Edwin Dada, MD  ?isosorbide dinitrate (ISORDIL) 30 MG tablet Take 1 tablet (30 mg total) by mouth 3 (three) times daily. ?Patient not taking: Reported on 01/17/2022 06/21/21   Edwin Dada, MD  ?isosorbide dinitrate (ISORDIL) 30 MG tablet Take 1 tablet (30 mg total) by mouth 3 (three) times daily. ?Patient not taking: Reported on 01/17/2022 06/21/21   Edwin Dada, MD  ?metoprolol succinate (TOPROL XL) 100 MG 24 hr tablet Take 1 tablet (100 mg total) by mouth daily for 2 days. Take with or immediately following a meal. 06/21/21 06/23/21  Danford, Suann Larry, MD  ?pantoprazole (PROTONIX) 40 MG tablet Take 1 tablet (40 mg total) by mouth daily. 12/08/21 01/07/22  Lucrezia Starch, MD  ?sucralfate (CARAFATE) 1 GM/10ML suspension Take 10 mLs (1 g total) by mouth 4 (four) times daily for 5 days. 12/08/21 12/13/21  Lucrezia Starch, MD  ? ? ?Physical Exam: ?Vitals:  ? 01/17/22 1026 01/17/22 1230 01/17/22 1320 01/17/22 1332  ?BP: 100/80  102/78 91/72  ?Pulse: 86 87 80 87  ?Resp: 18 (!) 30 (!) 28 (!) 22  ?Temp:      ?TempSrc:      ?SpO2: 100% 100% 100% 97%  ?Weight:      ?Height:      ? ?Vitals:  ? 01/17/22 1026 01/17/22 1230 01/17/22  1320 01/17/22 1332  ?BP: 100/80  102/78 91/72  ?Pulse: 86 87 80 87  ?Resp: 18 (!) 30 (!) 28 (!) 22  ?Temp:      ?TempSrc:      ?SpO2: 100% 100% 100% 97%  ?Weight:      ?Height:      ? ?General: Vital signs reviewed.  Patient is well-developed and well-nourished, in no acute distress and cooperative with exam.  ?Head: Normocephalic and atraumatic. ?Eyes: EOMI, conjunctivae normal, no scleral icterus.  ?Neck: Supple, trachea midline, normal ROM,  ?Cardiovascular: RRR, S1 normal, S2 normal, no murmurs, gallops, or rubs. ?Pulmonary/Chest: Clear to auscultation bilaterally, no wheezes, rales, or rhonchi. ?Abdominal: Soft, non-tender, non-distended, BS +,  ?Extremities: No lower extremity edema bilaterally,  pulses symmetric and intact bilaterally. No cyanosis or clubbing. ?Neurological: A&O  x3, Strength is normal and symmetric bilaterally, cranial nerve II-XII are grossly intact, no focal motor deficit, sensory intact to light touch bilaterally.  ?Skin: Warm, dry and intact. No rashes or erythema. ?Psychiatric: Normal mood and affect.  ? ?Data Reviewed: ?Prior data available on epic and Care Everywhere was reviewed. ? ?Assessment and Plan: ?* Acute on chronic HFrEF (heart failure with reduced ejection fraction) ([place]) ?Patient seems to have dilated cardiomyopathy.  No ischemic work-up seen in chart.  Patient is not aware of any cardiac catheterization.  Prior echocardiogram with EF of less than 20% and moderate to severe mitral regurgitation. ?He was referred for heart failure clinic but never followed up. ?Clinically seems euvolemic, but weight is up per patient.  Per patient dry weight is 190 pound. ?Clinically appears euvolemic.  BNP at 2253, decreased from prior reading of 3958 a month ago. ?IV Lasix was ordered in ED. ?Worsening renal function after increasing the home dose of Lasix for the past 2 days. ?-Admit to progressive care unit ?-Repeat echocardiogram. ?-Hold Lasix ?-Monitor renal function ?-Daily weight and  BMP ?-Strict intake and output ?-Cardiology consult ? ? ?Syncope ?Patient felt dizzy and someone was able to hold him before falling down.  No chest pain. ?Recently increased Lasix. ?Patient has very low

## 2022-01-17 NOTE — ED Notes (Signed)
Katy Foust, NP at bedside ?

## 2022-01-17 NOTE — Assessment & Plan Note (Addendum)
Admitted to ICU with cardiogenic shock 4/3.  Presented with worsening dyspnea and orthopnea, weight gain, elevated BNP 2253, and bilateral pleural effusions. ?Most recent echo with EF less than 20% with moderate to severe MR. ?Right and left cardiac cath on 4/7 without significant CAD, findings consistent with nonischemic cardiomyopathy (see report, or conclusion below). ?-- Management per cardiology ?-- Taken off milrinone 4/8 ?-- Diuresed with IV Lasix 40 mg BID ?-- Transitioned to PO Lasix 40 mg daily ?-- Started on low-dose Coreg 3.125 mg BID ?-- Spironolactone, increased to 25 mg ?-- Started on Digoxin ?-- Weaned off Levophed ?-- Strict I/O's, Daily weights ? ?Net IO Since Admission: -12,918.88 mL [01/25/22 1439] ? ? ? ?

## 2022-01-17 NOTE — ED Provider Notes (Addendum)
? ?[facility] ?Provider Note ? ? ? Event Date/Time  ? First MD Initiated Contact with Patient 01/17/22 0900   ?  (approximate) ? ? ?History  ? ?Shortness of Breath ? ? ?HPI ? ?Mario Beck is a 41 y.o. male   presents to the ED after being seen in the emergency department [place] for "fluid retention".  Patient also reports 2 episodes of syncope.  But in talking with him further he states that he got "extremely weak and went down".  He was told by the ED in [facility] to take Lasix twice a day which she has continued to comply with.  Patient states that he has not drank alcohol in the last 8 days.  Last bowel movement was today and he states there was no blood or tarry stools noted in the last several days.  He denies any nausea or vomiting.  Patient does have a history of congestive heart failure, hypertension, asthma, alcohol abuse, alcoholic pancreatitis, GERD, hypokalemia, hypomagnesia. ? ?  ? ? ?Physical Exam  ? ?Triage Vital Signs: ?ED Triage Vitals  ?Enc Vitals Group  ?   BP 01/17/22 0848 102/82  ?   Pulse Rate 01/17/22 0848 88  ?   Resp 01/17/22 0848 18  ?   Temp 01/17/22 0851 97.8 ?F (36.6 ?C)  ?   Temp Source 01/17/22 0848 Oral  ?   SpO2 01/17/22 0848 100 %  ?   Weight 01/17/22 0849 195 lb (88.5 kg)  ?   Height 01/17/22 0849 6\' 3"  (1.905 m)  ?   Head Circumference --   ?   Peak Flow --   ?   Pain Score 01/17/22 0848 0  ?   Pain Loc --   ?   Pain Edu? --   ?   Excl. in GC? --   ? ? ?Most recent vital signs: ?Vitals:  ? 01/17/22 1320 01/17/22 1332  ?BP: 102/78 91/72  ?Pulse: 80 87  ?Resp: (!) 28 (!) 22  ?Temp:    ?SpO2: 100% 97%  ? ? ? ?General: Awake, no distress.  Able to talk in complete sentences without any shortness of breath. ?CV:  Good peripheral perfusion.  Heart regular rate and rhythm. ?Resp:  Normal effort.  Lungs are clear, no rales or rhonchi noted. ?Abd:  No distention.  Abdomen soft ?Other:   ? ? ?ED Results / Procedures / Treatments  ? ?Labs ?(all labs ordered are  listed, but only abnormal results are displayed) ?Labs Reviewed  ?BASIC METABOLIC PANEL - Abnormal; Notable for the following components:  ?    Result Value  ? Potassium 6.1 (*)   ? Glucose, Bld 115 (*)   ? BUN 30 (*)   ? Creatinine, Ser 2.53 (*)   ? Calcium 8.6 (*)   ? GFR, Estimated 32 (*)   ? All other components within normal limits  ?CBC - Abnormal; Notable for the following components:  ? RBC 3.34 (*)   ? Hemoglobin 9.4 (*)   ? HCT 28.7 (*)   ? RDW 18.5 (*)   ? nRBC 1.4 (*)   ? All other components within normal limits  ?BRAIN NATRIURETIC PEPTIDE - Abnormal; Notable for the following components:  ? B Natriuretic Peptide 2,253.2 (*)   ? All other components within normal limits  ?RESP PANEL BY RT-PCR (FLU A&B, COVID) [id number]  ?CULTURE, BLOOD (ROUTINE X 2)  ?CULTURE, BLOOD (ROUTINE X 2)  ?LIPASE, BLOOD  ?MAGNESIUM  ?LACTIC  ACID, PLASMA  ?LACTIC ACID, PLASMA  ?CREATININE, SERUM  ?URINALYSIS, COMPLETE (UACMP) WITH MICROSCOPIC  ? ? ? ?EKG ?Normal sinus rhythm.  Left ventricular hypertrophy. ?Vent. rate 87 BPM ?PR interval 184 ms ?QRS duration 86 ms ?QT/QTcB 396/476 ms ?P-R-T axes 59 74 -56 ? ?RADIOLOGY ?Chest x-ray reviewed by myself independent of the radiologist is suspicious for a left lower lobe pneumonia.  Radiology report is left lower renal effusion with atelectasis versus pneumonia. ? ?CT chest per radiologist shows multifocal pneumonia with patchy groundglass appearance suggestive of COVID.  New mildly enlarged mediastinal lymph nodes.  Heart cardiomegaly with emphysema. Bilateral solid pulmonary nodules ? ?PROCEDURES: ? ?Critical Care performed:  ? ?Procedures ? ? ?MEDICATIONS ORDERED IN ED: ?Medications  ?calcium gluconate inj 10% (1 g) URGENT USE ONLY! (has no administration in time range)  ?sodium bicarbonate injection 50 mEq (has no administration in time range)  ?sodium zirconium cyclosilicate (LOKELMA) packet 10 g (has no administration in time range)  ?furosemide (LASIX) injection 80 mg (80 mg  Intravenous Not Given 01/17/22 1512)  ?insulin aspart (novoLOG) injection 10 Units (has no administration in time range)  ?cefTRIAXone (ROCEPHIN) 2 g in sodium chloride 0.9 % 100 mL IVPB (has no administration in time range)  ?azithromycin (ZITHROMAX) 500 mg in sodium chloride 0.9 % 250 mL IVPB (has no administration in time range)  ?enoxaparin (LOVENOX) injection 40 mg (has no administration in time range)  ?sodium chloride flush (NS) 0.9 % injection 3 mL (has no administration in time range)  ?acetaminophen (TYLENOL) tablet 650 mg (has no administration in time range)  ?  Or  ?acetaminophen (TYLENOL) suppository 650 mg (has no administration in time range)  ?polyethylene glycol (MIRALAX / GLYCOLAX) packet 17 g (has no administration in time range)  ?thiamine tablet 100 mg (has no administration in time range)  ?dextrose 50 % solution 100 mL (100 mLs Intravenous Given 01/17/22 1514)  ?morphine (PF) 4 MG/ML injection 4 mg (4 mg Intravenous Given 01/17/22 1518)  ? ? ? ?IMPRESSION / MDM / ASSESSMENT AND PLAN / ED COURSE  ?I reviewed the triage vital signs and the nursing notes. ? ? ?Differential diagnosis includes, but is not limited to, congestive heart failure, pneumonia, reported syncopal episode unknown etiology, anemia of unknown etiology. ? ?41 year old male presents to the ED with continued feeling weak and not well.  Patient was seen in the emergency department in [facility] on 01/15/2022 and discharged with instructions to increase his Lasix to twice a day with his history of CHF.  Patient has multiple health problems including hypertension, CHF, asthma, alcohol abuse, acute alcoholic pancreatitis, cardiomyopathy, and COVID and 2022.  P shows potassium elevated at 6.1, BUN 30, creatinine 2.5, GFR 32.  CBC showed hemoglobin and hematocrit 9 0.4 and 28.7 respectively.  Lipase was 19.  BMP was 2253.  Chest x-ray showed a area of left lower atelectasis versus pneumonia.  CT scan was obtained and there is a that is  suggestive of groundglass and multifocal pneumonia.  EKG showed normal sinus rhythm at a ventricular rate of 87 with left ventricular hypertrophy.  I spoke with Dr. Scotty Court who is my attending physician who agrees that this patient needs to be admitted.  Hospitalist was contacted and admission orders placed. ? ? ? ? ?FINAL CLINICAL IMPRESSION(S) / ED DIAGNOSES  ? ?Final diagnoses:  ?Multifocal pneumonia  ?Acute renal injury (HCC)  ?Anemia of unknown etiology  ?Hyperkalemia  ? ? ? ?Rx / DC Orders  ? ?ED Discharge Orders   ? ?  None  ? ?  ? ? ? ?Note:  This document was prepared using Dragon voice recognition software and may include unintentional dictation errors. ?  ?Tommi Rumps, PA-C ?01/17/22 1523 ? ?  ?Tommi Rumps, PA-C ?01/17/22 1524 ? ?  ?Sharman Cheek, MD ?01/17/22 1548 ? ?

## 2022-01-17 NOTE — Procedures (Signed)
Central Venous Catheter Insertion Procedure Note ? ?Mario Beck  ?[id number]  ?1981/06/20 ? ?Date:01/17/22  ?Time:10:40 PM  ? ?Provider Performing:Brent Eddith Mentor  ? ?Procedure: Insertion of Non-tunneled Central Venous Catheter(36556) with US guidance (01027)  ? ?Indication(s) ?Medication administration ? ?Consent ?Risks of the procedure as well as the alternatives and risks of each were explained to the patient and/or caregiver.  Consent for the procedure was obtained and is signed in the bedside chart ? ?Anesthesia ?Topical only with 1% lidocaine  ? ?Timeout ?Verified patient identification, verified procedure, site/side was marked, verified correct patient position, special equipment/implants available, medications/allergies/relevant history reviewed, required imaging and test results available. ? ?Sterile Technique ?Maximal sterile technique including full sterile barrier drape, hand hygiene, sterile gown, sterile gloves, mask, hair covering, sterile ultrasound probe cover (if used). ? ?Procedure Description ?Area of catheter insertion was cleaned with chlorhexidine and draped in sterile fashion.  With real-time ultrasound guidance a central venous catheter was placed into the right internal jugular vein. Nonpulsatile blood flow and easy flushing noted in all ports.  The catheter was sutured in place and sterile dressing applied. ? ?Complications/Tolerance ?None; patient tolerated the procedure well. ?Chest X-ray is ordered to verify placement for internal jugular or subclavian cannulation.   Chest x-ray is not ordered for femoral cannulation. ? ?EBL ?Minimal ? ?Specimen(s) ?None ? ?Heber Overland, MD ?[place] [facility] ?Pager: [phone] ?Cell: [phone] ?After 7:00 pm call Elink  [phone] ? ?

## 2022-01-17 NOTE — Assessment & Plan Note (Deleted)
Blood pressure currently on lower normal limit. ?He was on metoprolol 100 mg daily, isosorbide, hydralazine 25 mg 3 times a day and Lasix 40 mg twice daily at home. ?-Holding home antihypertensives ?-Continue to monitor ? ?

## 2022-01-17 NOTE — Assessment & Plan Note (Addendum)
POA, resolved.   ?Most likely secondary to decreased perfusio in the setting of cardiogenic shock n.  No leukocytosis or fever.  CT chest was concerning for multifocal pneumonia, started on empiric antibiotics on admission. ? ?

## 2022-01-17 NOTE — Assessment & Plan Note (Addendum)
POA, resolved.  Likely prerenal etiology given increased home dose Lasix in days prior to admission. ?--Monitor BMP ?

## 2022-01-17 NOTE — ED Notes (Signed)
Pt c/o nausea. Secure msg sent to Dr. Nelson Chimes. ?

## 2022-01-18 ENCOUNTER — Inpatient Hospital Stay: Payer: Medicaid Other

## 2022-01-18 ENCOUNTER — Inpatient Hospital Stay (HOSPITAL_COMMUNITY)
Admit: 2022-01-18 | Discharge: 2022-01-18 | Disposition: A | Discharge status: Home / Self Care | Payer: Medicaid Other | Attending: Internal Medicine | Admitting: Internal Medicine

## 2022-01-18 ENCOUNTER — Other Ambulatory Visit: Payer: Self-pay

## 2022-01-18 DIAGNOSIS — Z720 Tobacco use: Secondary | ICD-10-CM

## 2022-01-18 DIAGNOSIS — R570 Cardiogenic shock: Secondary | ICD-10-CM

## 2022-01-18 DIAGNOSIS — N179 Acute kidney failure, unspecified: Secondary | ICD-10-CM

## 2022-01-18 DIAGNOSIS — I959 Hypotension, unspecified: Secondary | ICD-10-CM

## 2022-01-18 DIAGNOSIS — I5023 Acute on chronic systolic (congestive) heart failure: Secondary | ICD-10-CM

## 2022-01-18 DIAGNOSIS — F1010 Alcohol abuse, uncomplicated: Secondary | ICD-10-CM

## 2022-01-18 DIAGNOSIS — R0609 Other forms of dyspnea: Secondary | ICD-10-CM

## 2022-01-18 DIAGNOSIS — Z7189 Other specified counseling: Secondary | ICD-10-CM

## 2022-01-18 LAB — BASIC METABOLIC PANEL
Anion gap: 12 (ref 5–15)
BUN: 33 mg/dL — ABNORMAL HIGH (ref 6–20)
CO2: 24 mmol/L (ref 22–32)
Calcium: 8.5 mg/dL — ABNORMAL LOW (ref 8.9–10.3)
Chloride: 103 mmol/L (ref 98–111)
Creatinine, Ser: 2.3 mg/dL — ABNORMAL HIGH (ref 0.61–1.24)
GFR, Estimated: 36 mL/min — ABNORMAL LOW (ref >60)
Glucose, Bld: 120 mg/dL — ABNORMAL HIGH (ref 70–99)
Potassium: 4.4 mmol/L (ref 3.5–5.1)
Sodium: 139 mmol/L (ref 135–145)

## 2022-01-18 LAB — GLUCOSE, PLEURAL OR PERITONEAL FLUID: Glucose, Fluid: 139 mg/dL

## 2022-01-18 LAB — ECHOCARDIOGRAM COMPLETE
AR max vel: 3.17 cm2
AV Area VTI: 3.23 cm2
AV Area mean vel: 2.95 cm2
AV Mean grad: 2 mmHg
AV Peak grad: 4.3 mmHg
Ao pk vel: 1.04 m/s
Area-P 1/2: 3.3 cm2
Height: 75 in
MV VTI: 2.66 cm2
S' Lateral: 6.77 cm
Weight: 3379.21 oz

## 2022-01-18 LAB — URINALYSIS, COMPLETE (UACMP) WITH MICROSCOPIC
Bacteria, UA: NONE SEEN
Bilirubin Urine: NEGATIVE
Glucose, UA: NEGATIVE mg/dL
Ketones, ur: NEGATIVE mg/dL
Leukocytes,Ua: NEGATIVE
Nitrite: NEGATIVE
Protein, ur: NEGATIVE mg/dL
Specific Gravity, Urine: 1.006 (ref 1.005–1.030)
pH: 5 (ref 5.0–8.0)

## 2022-01-18 LAB — PHOSPHORUS: Phosphorus: 4.1 mg/dL (ref 2.5–4.6)

## 2022-01-18 LAB — BODY FLUID CELL COUNT WITH DIFFERENTIAL
Eos, Fluid: 0 %
Lymphs, Fluid: 35 %
Monocyte-Macrophage-Serous Fluid: 1 %
Neutrophil Count, Fluid: 64 %
Total Nucleated Cell Count, Fluid: 1987 cu mm

## 2022-01-18 LAB — GLUCOSE, CAPILLARY: Glucose-Capillary: 119 mg/dL — ABNORMAL HIGH (ref 70–99)

## 2022-01-18 LAB — COOXEMETRY PANEL
Carboxyhemoglobin: 2.2 % — ABNORMAL HIGH (ref 0.5–1.5)
Methemoglobin: 1 % (ref 0.0–1.5)
O2 Saturation: 75.2 %
Total hemoglobin: 8.8 g/dL — ABNORMAL LOW (ref 12.0–16.0)
Total oxygen content: 72.7 mL/dL

## 2022-01-18 LAB — MAGNESIUM: Magnesium: 2 mg/dL (ref 1.7–2.4)

## 2022-01-18 LAB — PROTEIN, PLEURAL OR PERITONEAL FLUID: Total protein, fluid: <3 g/dL

## 2022-01-18 LAB — TROPONIN I (HIGH SENSITIVITY)
Troponin I (High Sensitivity): 32 ng/L — ABNORMAL HIGH (ref <18)
Troponin I (High Sensitivity): 39 ng/L — ABNORMAL HIGH (ref <18)

## 2022-01-18 MED ORDER — OXYCODONE HCL 5 MG PO TABS
5.0000 mg | ORAL_TABLET | Freq: Four times a day (QID) | ORAL | Status: DC | PRN
  Administered 2022-01-18 – 2022-01-19 (×4): 5 mg via ORAL
  Filled 2022-01-18 (×4): qty 1

## 2022-01-18 MED ORDER — ADULT MULTIVITAMIN W/MINERALS CH
1.0000 | ORAL_TABLET | Freq: Every day | ORAL | Status: DC
  Administered 2022-01-18 – 2022-01-28 (×11): 1 via ORAL
  Filled 2022-01-18 (×11): qty 1

## 2022-01-18 MED ORDER — HYDROXYZINE HCL 25 MG PO TABS
25.0000 mg | ORAL_TABLET | Freq: Three times a day (TID) | ORAL | Status: DC | PRN
  Administered 2022-01-18 – 2022-01-27 (×3): 25 mg via ORAL
  Filled 2022-01-18 (×4): qty 1

## 2022-01-18 MED ORDER — SODIUM CHLORIDE 0.9 % IV SOLN
2.0000 g | Freq: Once | INTRAVENOUS | Status: DC
  Filled 2022-01-18: qty 20

## 2022-01-18 MED ORDER — SODIUM CHLORIDE 0.9 % IV SOLN
500.0000 mg | INTRAVENOUS | Status: DC
  Administered 2022-01-18 – 2022-01-19 (×2): 500 mg via INTRAVENOUS
  Filled 2022-01-18 (×3): qty 5

## 2022-01-18 MED ORDER — SODIUM CHLORIDE 0.9 % IV SOLN
500.0000 mg | Freq: Once | INTRAVENOUS | Status: DC
  Filled 2022-01-18: qty 5

## 2022-01-18 MED ORDER — HYDROMORPHONE HCL 1 MG/ML IJ SOLN
0.5000 mg | Freq: Once | INTRAMUSCULAR | Status: AC
  Administered 2022-01-18: 0.5 mg via INTRAVENOUS
  Filled 2022-01-18: qty 1

## 2022-01-18 MED ORDER — FOLIC ACID 1 MG PO TABS
1.0000 mg | ORAL_TABLET | Freq: Every day | ORAL | Status: DC
  Administered 2022-01-18 – 2022-01-28 (×11): 1 mg via ORAL
  Filled 2022-01-18 (×11): qty 1

## 2022-01-18 MED ORDER — LIDOCAINE 5 % EX PTCH
1.0000 | MEDICATED_PATCH | CUTANEOUS | Status: DC
  Administered 2022-01-18 – 2022-01-28 (×11): 1 via TRANSDERMAL
  Filled 2022-01-18 (×11): qty 1

## 2022-01-18 MED ORDER — DIPHENHYDRAMINE HCL 25 MG PO CAPS: 25.0000 mg | ORAL_CAPSULE | Freq: Four times a day (QID) | ORAL | Status: DC | PRN

## 2022-01-18 MED ORDER — LORAZEPAM 2 MG/ML IJ SOLN
1.0000 mg | INTRAMUSCULAR | Status: DC | PRN
  Filled 2022-01-18: qty 1

## 2022-01-18 MED ORDER — LORAZEPAM 1 MG PO TABS: 1.0000 mg | ORAL_TABLET | ORAL | Status: DC | PRN

## 2022-01-18 MED ORDER — SODIUM CHLORIDE 0.9 % IV SOLN
2.0000 g | INTRAVENOUS | Status: DC
  Administered 2022-01-18 – 2022-01-20 (×3): 2 g via INTRAVENOUS
  Filled 2022-01-18: qty 2
  Filled 2022-01-18 (×3): qty 20

## 2022-01-18 NOTE — Consult Note (Addendum)
?Cardiology Consultation:  ? ?Patient ID: Mario Beck ?MRN: [id number]; DOB: [birth year] ? ?Admit date: 01/17/2022 ?Date of Consult: 01/18/2022 ? ?PCP:  Pcp, No ?  ?[facility] Providers ?Cardiologist: New ? ?Patient Profile:  ? ?Mario Beck is a 41 y.o. 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 Verlee Monte. ? ?History of Present Illness:  ? ?Mario Beck was previously seen by Dr. Humphrey Rolls. He has multiple admissions/ER visits for alcohol and alcohol related issues.  ? ?Admitted in 09/2020 for acute pancreatitis. Echo showed LVE 30%, was seen by ?cardiology, but never followed up.  ? ?He was admitted 06/2021 with orthopnea, leg swelling and dyspnea. He was COVID positive. BNP was >3000. HE was started on IV lasix. Echo showed LVEF<20%. He was started on BB and Bidil. He reported angioedema with ACE. Farxiga caused vomiting. Patient was lost to follow-up.  ? ?Recent hospital admission in [facility] for alcohol detox. Reports no alcohol since 3/21.  ? ?The patient presented to [facility] 01/17/22 for dyspnea, fatigue, weight gain.Reported compliance with medications, was on lasix.  ? ?In the ED K6.6, Scr 2.32, LA 4.4. BNP 2253. CXR with new L pleural effusion. CT chest with new peripheral lower lung predominately patchy ground glass opacities concerning for multifocal infection, new small L pleural effusion. He was given calcium gluconate, insulin and Lokelma. Also started on abx for PNA. Patient was admitted, however he decompensated overnight and was transferred to the ICU for pressors and milrinone. ? ? ?Past Medical History:  ?Diagnosis Date  ? Alcohol abuse   ? Asthma   ? CHF (congestive heart failure) ([place])   ? Depression   ? Hypertension   ? ? ?Past Surgical History:  ?Procedure Laterality Date  ? FRACTURE SURGERY    ? Left leg as a kid  ?  ? ?Home  Medications:  ?Prior to Admission medications   ?Medication Sig Start Date End Date Taking? Authorizing Provider  ?furosemide (LASIX) 40 MG tablet Take 1 tablet (40 mg total) by mouth once daily. 12/08/21 03/08/22 Yes Lucrezia Starch, MD  ?hydrALAZINE (APRESOLINE) 25 MG tablet Take 1 tablet (25 mg total) by mouth 3 (three) times daily. 06/21/21 06/21/22 Yes Danford, Suann Larry, MD  ?metoprolol succinate (TOPROL-XL) 100 MG 24 hr tablet Take 1 tablet (100 mg total) by mouth once daily. (Take with or immediately following a meal). 06/22/21  Yes Danford, Suann Larry, MD  ?Multiple Vitamin (MULTIVITAMIN WITH MINERALS) TABS tablet Take 1 tablet by mouth daily. 05/30/21  Yes Lorella Nimrod, MD  ?potassium chloride SA (KLOR-CON M) 20 MEQ tablet Take 2 tablets (40 mEq total) by mouth once daily. 12/08/21  Yes Lucrezia Starch, MD  ?albuterol (VENTOLIN HFA) 108 (90 Base) MCG/ACT inhaler Inhale 2 puffs into the lungs once every 6 (six) hours as needed for wheezing or shortness of breath. ?Patient not taking: Reported on 01/17/2022 06/21/21   Edwin Dada, MD  ?benzonatate (TESSALON) 100 MG capsule Take 2 capsules (200 mg total) by mouth 3 (three) times daily as needed for cough for 10 days. ?Patient not taking: Reported on 01/17/2022 06/21/21   Edwin Dada, MD  ?isosorbide dinitrate (ISORDIL) 30 MG tablet Take 1 tablet (30 mg total) by mouth 3 (three) times daily. ?Patient not taking: Reported on 01/17/2022 06/21/21   Edwin Dada, MD  ?pantoprazole (PROTONIX) 40  MG tablet Take 1 tablet (40 mg total) by mouth daily. 12/08/21 01/07/22  Lucrezia Starch, MD  ? ? ?Inpatient Medications: ?Scheduled Meds: ? Chlorhexidine Gluconate Cloth  6 each Topical Daily  ? heparin injection (subcutaneous)  5,000 Units Subcutaneous Q8H  ? nicotine  21 mg Transdermal Daily  ? sodium chloride flush  3 mL Intravenous Q12H  ? thiamine  100 mg Oral Daily  ? ?Continuous Infusions: ? sodium chloride Stopped (01/17/22 2350)  ? milrinone  0.25 mcg/kg/min (01/18/22 0618)  ? norepinephrine (LEVOPHED) Adult infusion Stopped (01/18/22 [id number])  ? ?PRN Meds: ?acetaminophen **OR** acetaminophen, ondansetron (ZOFRAN) IV, polyethylene glycol ? ?Allergies:    ?Allergies  ?Allergen Reactions  ? Lisinopril Swelling  ?  Angioedema ?  ? Shellfish Allergy Swelling  ? ? ?Social History:   ?Social History  ? ?Socioeconomic History  ? Marital status: Legally Separated  ?  Spouse name: Not on file  ? Number of children: 2  ? Years of education: Not on file  ? Highest education level: Not on file  ?Occupational History  ? Occupation: Production Management  ?Tobacco Use  ? Smoking status: Every Day  ?  Packs/day: 1.00  ?  Types: Cigarettes  ? Smokeless tobacco: Current  ?  Types: Snuff  ? Tobacco comments:  ?  Does pouches as welll   ?Vaping Use  ? Vaping Use: Never used  ?Substance and Sexual Activity  ? Alcohol use: Not Currently  ?  Alcohol/week: 7.0 standard drinks  ?  Types: 7 Cans of beer per week  ? Drug use: No  ? Sexual activity: Yes  ?Other Topics Concern  ? Not on file  ?Social History Narrative  ? Not on file  ? ?Social Determinants of Health  ? ?Financial Resource Strain: Not on file  ?Food Insecurity: Not on file  ?Transportation Needs: Not on file  ?Physical Activity: Not on file  ?Stress: Not on file  ?Social Connections: Not on file  ?Intimate Partner Violence: Not on file  ?  ?Family History:   ? ?Family History  ?Problem Relation Age of Onset  ? Colon cancer Neg Hx   ? Esophageal cancer Neg Hx   ?  ? ?ROS:  ?Please see the history of present illness.  ? ?All other ROS reviewed and negative.    ? ?Physical Exam/Data:  ? ?Vitals:  ? 01/18/22 0500 01/18/22 0600 01/18/22 0636 01/18/22 0700  ?BP: (!) 143/98 (!) 136/96  123/82  ?Pulse: 89 (!) 101 99 96  ?Resp: (!) 26 (!) 32 (!) 28 (!) 25  ?Temp:      ?TempSrc:      ?SpO2: 96% 97% 94% 95%  ?Weight: 95.8 kg     ?Height:      ? ? ?Intake/Output Summary (Last 24 hours) at 01/18/2022 [id number] ?Last data filed at 01/18/2022  [id number] ?Gross per 24 hour  ?Intake 489.76 ml  ?Output 1600 ml  ?Net -1110.24 ml  ? ? ?  01/18/2022  ?  5:00 AM 01/17/2022  ? 10:00 PM 01/17/2022  ?  8:49 AM  ?Last 3 Weights  ?Weight (lbs) 211 lb 3.2 oz 210 lb 15.7 oz 195 lb  ?Weight (kg) 95.8 kg 95.7 kg 88.451 kg  ?   ?Body mass index is 26.4 kg/m?.  ?General:  Well nourished, well developed, in no acute distress ?HEENT: normal ?Neck: difficult oto assess JVD ?Vascular: No carotid bruits; Distal pulses 2+ bilaterally ?Cardiac:  normal S1, S2; RRR; no murmur  ?Lungs:  diminished lung sounds at bases ?Abd: soft, nontender, no hepatomegaly  ?Ext: no edema ?Musculoskeletal:  No deformities, BUE and BLE strength normal and equal ?Skin: warm and dry  ?Neuro:  CNs 2-12 intact, no focal abnormalities noted ?Psych:  Normal affect  ? ?EKG:  The EKG was personally reviewed and demonstrates:  NSR, 87bpm, LVH, repol abnormalities, nonspecific T wave changes inf/lat leads ?Telemetry:  Telemetry was personally reviewed and demonstrates:  SR/ST, HR 90-110 ? ?Relevant CV Studies: ? ?Echo ordered ? ?Echo Jul 13, 2021 ? 1. Left ventricular ejection fraction, by estimation, is <20%. The left  ?ventricle has severely decreased function. The left ventricle demonstrates  ?global hypokinesis. The left ventricular internal cavity size was  ?moderately to severely dilated. There is  ? mild left ventricular hypertrophy. Left ventricular diastolic parameters  ?are consistent with Grade II diastolic dysfunction (pseudonormalization).  ? 2. Right ventricular systolic function is moderately reduced. The right  ?ventricular size is mildly enlarged. There is normal pulmonary artery  ?systolic pressure.  ? 3. Left atrial size was severely dilated.  ? 4. Right atrial size was moderately dilated.  ? 5. The mitral valve is normal in structure. Moderate to severe mitral  ?valve regurgitation. No evidence of mitral stenosis.  ? 6. Tricuspid valve regurgitation is moderate.  ? 7. The aortic valve is normal in  structure. Aortic valve regurgitation is  ?not visualized. No aortic stenosis is present.  ? 8. The inferior vena cava is normal in size with greater than 50%  ?respiratory variability, suggesting right atrial pressure of

## 2022-01-18 NOTE — Progress Notes (Addendum)
eLink Physician-Brief Progress Note ?Patient Name: Mario Beck ?DOB: 12/02/1980 ?MRN: [id number] ? ? ?Date of Service ? 01/18/2022  ?HPI/Events of Note ? Patient c/o R sided chest pain. 12 Lead EKG reveals sinus tachycardia. ?Probable left atrial enlargement. LVH with secondary repolarization abnormality.  Cardiac echo already ordered.   ?eICU Interventions ? Plan: ?Cycle Troponin  ? ? ? ?Intervention Category ?Major Interventions: Other: ? ?[name] ?01/18/2022, 6:19 AM ?

## 2022-01-18 NOTE — Progress Notes (Addendum)
? ?NAME:  MABEL UNREIN, MRN:  [id number], DOB:  1981/05/31, LOS: 1 ?ADMISSION DATE:  01/17/2022, CONSULTATION DATE:  4/3 ?REFERRING MD:  Nelson Chimes, CHIEF COMPLAINT:  Dyspnea  ? ?History of Present Illness:  ?This is a pleasant 41 yo male with a history of cardiomyopathy presented to the [facility] ER on 4/3 complaining of 3-4 days of dyspnea, fatigue and weight gain. He says that he has been compliant with his medications which include furosemide, metoprolol, nexium, potassium and sertraline.  However he says that the lasix has not been working for him in the last week or so and his urine output has become very minimal and dark.  He has noted increasing dyspnea when he climbs a flight of stairs in his apartment and yesterday passed out twice when he was walking across the parking lot.   ?He was admitted to [facility] for alcohol detox on 3/21, treated with lithium, and discharged on 3/24.  He says that he has been compliant with his medications. ?He was supposed to see Dr. Welton Flakes with cardiology after his last discharge for alcoholic pancreatitis, acute decompensated CHF in 06/2021, but he missed his appointments and has not followed up.  ?He smokes 1 pack of cigarettes daily.  He quit drinking on 3/21 and has not had alcohol since then.  He denies illicit drug use. ? 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 ?Significant Hospital Events: ?Including procedures, antibiotic start and stop dates in addition to other pertinent events   ?4/3 admission for multi-organ failure from presumed cardiogenic shock ? ?Interim History / Subjective:  ?Complains of a great deal of right sided pleuritic CP, also worse with movement. Levo weaned off overnight.  ? ?Objective   ?Blood pressure 127/89, pulse 96, temperature 98.6 ?F (37 ?C),  temperature source Oral, resp. rate (!) 28, height 6\' 3"  (1.905 m), weight 95.8 kg, SpO2 98 %. ?CVP:  [11 mmHg-27 mmHg] 27 mmHg  ?   ? ?Intake/Output Summary (Last 24 hours) at 01/18/2022 0946 ?Last data filed at 01/18/2022 0900 ?Gross per 24 hour  ?Intake 507.68 ml  ?Output 1600 ml  ?Net -1092.32 ml  ? ?Filed Weights  ? 01/17/22 0849 01/17/22 2200 01/18/22 0500  ?Weight: 88.5 kg 95.7 kg 95.8 kg  ? ? ?Examination: ? ?General:  Resting comfortably in bed ?HENT: NCAT OP clear, JVP elevated ?PULM: diminished on left, normal effort ?CV: RRR, no mgr ?GI: BS+, soft, nontender ?MSK: normal bulk and tone ?Derm: trace edema in ankles, warmer ?Neuro: awake, alert, no distress, MAEW ? ? ?Resolved Hospital Problem list   ? ? ?Assessment & Plan:  ?# Cardiogenic shock due to acute decompensated systolic heart failure; cause is uncertain but most likely inadequate medical management given history of non-compliance ?Milrinone , afterload reduction, diuresis per cardiology ?Cardiology consult ?Echo/heart catheterization decision for cardiology ? ?# AKI due to cardiogenic shock ?As above ?Monitor BMET and UOP ?Replace electrolytes as needed ? ?# Alcohol abuse, in remission ?Monitor for withdrawal ? ?# Depression ?Zoloft  ? ?# Cigarette smoker ?Counsel to quit ? ?# CAP ?# LUL nodule, multiple pulmonary nodules ?# Left pleural effusion ?dx thoracentesis today ?continue ceftriaxone, azithro ?will need repeat CT Chest in 6-8 weeks and clinic follow up ? ?Best Practice (right click and "Reselect all SmartList Selections" daily)  ? ?Diet/type: Regular consistency (see orders) ?DVT prophylaxis:  prophylactic heparin  ?GI prophylaxis: N/A ?Lines: Central line and yes and it is still needed ?Foley:  N/A ?Code Status:  full code ?Last date of multidisciplinary goals of care discussion [4/3] ? ?Critical care time: 35 minutes ?  ? ? ? ? ? ?Judithann Graves  ?[facility] ? ? ?

## 2022-01-18 NOTE — Consult Note (Addendum)
? ?                                                                                ?Consultation Note ?Date: 01/18/2022  ? ?Patient Name: Mario Beck  ?DOB: [birth year]  MRN: [id number]  Age / Sex: 41 y.o., male  ?PCP: Pcp, No ?Referring Physician: Omar Person, MD ? ?Reason for Consultation: Establishing goals of care ? ?HPI/Patient Profile: This is a pleasant 42 yo male with a history of cardiomyopathy presented to the [facility] ER on 4/3 complaining of 3-4 days of dyspnea, fatigue and weight gain. He says that he has been compliant with his medications which include furosemide, metoprolol, nexium, potassium and sertraline.  However he says that the lasix has not been working for him in the last week or so and his urine output has become very minimal and dark.  He has noted increasing dyspnea when he climbs a flight of stairs in his apartment and yesterday passed out twice when he was walking across the parking lot.   ? ?Clinical Assessment and Goals of Care: ?Notes, diagnostics, and labs reviewed. Spoke with CCM.  ?Patient is sitting in bed, no family at bedside.  He states he has a significant other.  He states he has children (under the age of 26), and parents.  He tells me he would want his significant other to be his surrogate decision maker if he was unable to make decisions. Discussed having spiritual care to complete H POA papers with him. ? ?His significant other entered as we were finishing this conversation.  Discussed with her his wishes for her to be his healthcare power of attorney, she consents. He states "I'm not dying am I?"  ? ?We broached his diagnoses. He states that he has been missing his appointments because they do not line up with his work schedule.  Discussed his symptoms, and how this is related to his heart failure.  Discussed diagnoses from previous admission.  Discussed diagnoses during this admission and how this relates to his heart failure. Discussed prognosis and how it is  impacted by attending follow-ups and medication management. He states he was unaware he was so sick, and will start doing what he is asked to do when he goes home.  ? ?Discussed GOC.The difference between an aggressive medical intervention path and a comfort care path was discussed.  Values and goals of care important to patient and family were attempted to be elicited. ? ?Discussed limitations of medical interventions to prolong quality of life in some situations and discussed the concept of human mortality. ? ?Discussed care in the hospital, including CODE STATUS and he states at this time he would want any care necessary.  Significant other states they have been together for around 3 years. She voices disappointment that he does not follow-up and take medications as he should.   ?  ? ?SUMMARY OF RECOMMENDATIONS   ? ?Full code/full scope at this time. ?PMT will continue to follow. ? ? ?Prognosis:  ?Poor ? ? ? ?  ? ?Primary Diagnoses: ?Present on Admission: ? Acute on chronic HFrEF (heart failure with reduced ejection fraction) (HCC) ? Syncope ? AKI (acute  kidney injury) (HCC) ? Hyperkalemia ? HTN (hypertension), malignant ? Alcohol use disorder, moderate, dependence (HCC) ? Lactic acidosis ? ? ?I have reviewed the medical record, interviewed the patient and family, and examined the patient. The following aspects are pertinent. ? ?Past Medical History:  ?Diagnosis Date  ? Alcohol abuse   ? Asthma   ? CHF (congestive heart failure) (HCC)   ? Depression   ? Hypertension   ? ?Social History  ? ?Socioeconomic History  ? Marital status: Legally Separated  ?  Spouse name: Not on file  ? Number of children: 2  ? Years of education: Not on file  ? Highest education level: Not on file  ?Occupational History  ? Occupation: Production Management  ?Tobacco Use  ? Smoking status: Every Day  ?  Packs/day: 1.00  ?  Types: Cigarettes  ? Smokeless tobacco: Current  ?  Types: Snuff  ? Tobacco comments:  ?  Does pouches as welll    ?Vaping Use  ? Vaping Use: Never used  ?Substance and Sexual Activity  ? Alcohol use: Not Currently  ?  Alcohol/week: 7.0 standard drinks  ?  Types: 7 Cans of beer per week  ? Drug use: No  ? Sexual activity: Yes  ?Other Topics Concern  ? Not on file  ?Social History Narrative  ? Not on file  ? ?Social Determinants of Health  ? ?Financial Resource Strain: Not on file  ?Food Insecurity: Not on file  ?Transportation Needs: Not on file  ?Physical Activity: Not on file  ?Stress: Not on file  ?Social Connections: Not on file  ? ?Family History  ?Problem Relation Age of Onset  ? Colon cancer Neg Hx   ? Esophageal cancer Neg Hx   ? ?Scheduled Meds: ? Chlorhexidine Gluconate Cloth  6 each Topical Daily  ? folic acid  1 mg Oral Daily  ? heparin injection (subcutaneous)  5,000 Units Subcutaneous Q8H  ? lidocaine  1 patch Transdermal Q24H  ? multivitamin with minerals  1 tablet Oral Daily  ? nicotine  21 mg Transdermal Daily  ? sodium chloride flush  3 mL Intravenous Q12H  ? thiamine  100 mg Oral Daily  ? ?Continuous Infusions: ? sodium chloride Stopped (01/17/22 2350)  ? azithromycin (ZITHROMAX) 500 MG IVPB (Vial-Mate [employer])    ? cefTRIAXone (ROCEPHIN)  IV    ? milrinone 0.25 mcg/kg/min (01/18/22 1147)  ? ?PRN Meds:.acetaminophen **OR** acetaminophen, LORazepam **OR** LORazepam, ondansetron (ZOFRAN) IV, oxyCODONE, polyethylene glycol ?Medications Prior to Admission:  ?Prior to Admission medications   ?Medication Sig Start Date End Date Taking? Authorizing Provider  ?furosemide (LASIX) 40 MG tablet Take 1 tablet (40 mg total) by mouth once daily. 12/08/21 03/08/22 Yes Gilles Chiquito, MD  ?hydrALAZINE (APRESOLINE) 25 MG tablet Take 1 tablet (25 mg total) by mouth 3 (three) times daily. 06/21/21 06/21/22 Yes Danford, Earl Lites, MD  ?metoprolol succinate (TOPROL-XL) 100 MG 24 hr tablet Take 1 tablet (100 mg total) by mouth once daily. (Take with or immediately following a meal). 06/22/21  Yes Danford, Earl Lites, MD   ?Multiple Vitamin (MULTIVITAMIN WITH MINERALS) TABS tablet Take 1 tablet by mouth daily. 05/30/21  Yes Arnetha Courser, MD  ?potassium chloride SA (KLOR-CON M) 20 MEQ tablet Take 2 tablets (40 mEq total) by mouth once daily. 12/08/21  Yes Gilles Chiquito, MD  ?albuterol (VENTOLIN HFA) 108 (90 Base) MCG/ACT inhaler Inhale 2 puffs into the lungs once every 6 (six) hours as needed for wheezing or shortness  of breath. ?Patient not taking: Reported on 01/17/2022 06/21/21   Alberteen Sam, MD  ?pantoprazole (PROTONIX) 40 MG tablet Take 1 tablet (40 mg total) by mouth daily. 12/08/21 01/07/22  Gilles Chiquito, MD  ? ?Allergies  ?Allergen Reactions  ? Lisinopril Swelling  ?  Angioedema ?  ? Shellfish Allergy Swelling  ? ?Review of Systems  ?All other systems reviewed and are negative. ? ?Physical Exam ?Pulmonary:  ?   Effort: Pulmonary effort is normal.  ?Neurological:  ?   Mental Status: He is alert.  ? ? ?Vital Signs: BP 120/76   Pulse (!) 108   Temp 98.6 ?F (37 ?C) (Oral)   Resp 20   Ht 6\' 3"  (1.905 m)   Wt 95.8 kg   SpO2 98%   BMI 26.40 kg/m?  ?Pain Scale: 0-10 ?  ?Pain Score: 7  ? ? ?SpO2: SpO2: 98 % ?O2 Device:SpO2: 98 % ?O2 Flow Rate: .O2 Flow Rate (L/min): 2 L/min ? ?IO: Intake/output summary:  ?Intake/Output Summary (Last 24 hours) at 01/18/2022 1242 ?Last data filed at 01/18/2022 0900 ?Gross per 24 hour  ?Intake 507.68 ml  ?Output 1600 ml  ?Net -1092.32 ml  ? ? ?LBM: Last BM Date :  (PTA) ?Baseline Weight: Weight: 88.5 kg ?Most recent weight: Weight: 95.8 kg     ? ? ?Signed by: ?Morton Stall, NP ?  ?Please contact Palliative Medicine Team phone at [phone] for questions and concerns.  ?For individual provider: See Amion ? ? ? ? ? ? ? ? ? ? ? ? ? ?

## 2022-01-18 NOTE — Progress Notes (Signed)
?   01/18/22 1600  ?Clinical Encounter Type  ?Visited With Patient and family together  ?Visit Type Initial  ?Referral From Nurse ?(Advance Directive)  ? ?Chaplain provided paperwork and explanation for Advance Directive ?

## 2022-01-18 NOTE — Progress Notes (Signed)
PHARMACY CONSULT NOTE - FOLLOW UP ? ?Pharmacy Consult for Electrolyte Monitoring and Replacement  ? ?Recent Labs: ?Potassium (mmol/L)  ?Date Value  ?01/18/2022 4.4  ? ?Magnesium (mg/dL)  ?Date Value  ?01/17/2022 2.3  ? ?Calcium (mg/dL)  ?Date Value  ?01/18/2022 8.5 (L)  ? ?Albumin (g/dL)  ?Date Value  ?01/17/2022 3.3 (L)  ? ?Phosphorus (mg/dL)  ?Date Value  ?06/18/2021 3.7  ? ?Sodium (mmol/L)  ?Date Value  ?01/18/2022 139  ? ?Corrected Calcium: 9.06 mg/dl ? ?Assessment: ?41YOM presenting with dyspnea, fatigue, and weight gain. In ICU with cardiogenic shock due to acute decompensated systolic heart failure. PMH CHF EF 30%, alcohol dependence, multiple episodes alcoholic pancreatitis, cigarette smoking, GERD, depression. ? ?Milrinone 0.25 mcg/kg/min ? ?Scr 2.40 > 2.30 (BL 1.03) ? ?Goal of Therapy:  ?Lytes WNL ?Mag > 2 ?K 4-5 ? ?Plan:  ?Electrolytes WNL. No replacement indicated at this time. ?Follow up labs tomorrow AM. ? ? ?Jaynie Bream, PharmD ?Pharmacy Resident  ?01/18/2022 ?12:56 PM ? ? ?

## 2022-01-18 NOTE — Procedures (Signed)
Thoracentesis Procedure Note ? ?Mario Beck  ?[id number]  ?1981/02/02 ? ?Date:01/18/22  ?Time:3:59 PM  ? ?Provider Performing:Jaycub Noorani Lemmie Evens  ? ?Procedure: Thoracentesis with imaging guidance (69629) ? ?Indication(s) ?Pleural Effusion ? ?Consent ?Risks of the procedure as well as the alternatives and risks of each were explained to the patient and/or caregiver.  Consent for the procedure was obtained and is signed in the bedside chart ? ?Anesthesia ?Topical only with 1% lidocaine  ? ? ?Time Out ?Verified patient identification, verified procedure, site/side was marked, verified correct patient position, special equipment/implants available, medications/allergies/relevant history reviewed, required imaging and test results available. ? ? ?Sterile Technique ?Maximal sterile technique including full sterile barrier drape, hand hygiene, sterile gown, sterile gloves, mask, hair covering, sterile ultrasound probe cover (if used). ? ?Procedure Description ?Ultrasound was used to identify appropriate pleural anatomy for placement and overlying skin marked.  Area of drainage cleaned and draped in sterile fashion. Lidocaine was used to anesthetize the skin and subcutaneous tissue.  850 cc's of amber appearing fluid was drained from the left pleural space. Catheter then removed and bandaid applied to site. ? ?[place] lost power before image was saved. ? ? ?Complications/Tolerance ?None; patient tolerated the procedure well. ?Chest X-ray is ordered to confirm no post-procedural complication. ? ? ?EBL ?Minimal ? ? ?Specimen(s) ?Pleural fluid sent for micro, cyto ? ? ? ? ? ? ? ? ? ? ?

## 2022-01-19 ENCOUNTER — Inpatient Hospital Stay: Payer: Medicaid Other

## 2022-01-19 ENCOUNTER — Encounter: Payer: Self-pay | Admitting: Pulmonary Disease

## 2022-01-19 ENCOUNTER — Telehealth: Payer: Self-pay | Admitting: Student

## 2022-01-19 DIAGNOSIS — Z716 Tobacco abuse counseling: Secondary | ICD-10-CM

## 2022-01-19 DIAGNOSIS — R570 Cardiogenic shock: Secondary | ICD-10-CM | POA: Diagnosis present

## 2022-01-19 DIAGNOSIS — J189 Pneumonia, unspecified organism: Secondary | ICD-10-CM | POA: Diagnosis present

## 2022-01-19 DIAGNOSIS — R0781 Pleurodynia: Secondary | ICD-10-CM | POA: Clinically undetermined

## 2022-01-19 DIAGNOSIS — D649 Anemia, unspecified: Secondary | ICD-10-CM

## 2022-01-19 DIAGNOSIS — I2699 Other pulmonary embolism without acute cor pulmonale: Secondary | ICD-10-CM | POA: Diagnosis present

## 2022-01-19 DIAGNOSIS — Z7189 Other specified counseling: Secondary | ICD-10-CM

## 2022-01-19 DIAGNOSIS — J90 Pleural effusion, not elsewhere classified: Secondary | ICD-10-CM

## 2022-01-19 LAB — BASIC METABOLIC PANEL
Anion gap: 7 (ref 5–15)
BUN: 19 mg/dL (ref 6–20)
CO2: 27 mmol/L (ref 22–32)
Calcium: 8.3 mg/dL — ABNORMAL LOW (ref 8.9–10.3)
Chloride: 102 mmol/L (ref 98–111)
Creatinine, Ser: 1.2 mg/dL (ref 0.61–1.24)
GFR, Estimated: >60 mL/min (ref >60)
Glucose, Bld: 144 mg/dL — ABNORMAL HIGH (ref 70–99)
Potassium: 3.8 mmol/L (ref 3.5–5.1)
Sodium: 136 mmol/L (ref 135–145)

## 2022-01-19 LAB — COOXEMETRY PANEL
Carboxyhemoglobin: 1.6 % — ABNORMAL HIGH (ref 0.5–1.5)
Methemoglobin: 0.7 % (ref 0.0–1.5)
O2 Saturation: 71.1 %
Total hemoglobin: 9.7 g/dL — ABNORMAL LOW (ref 12.0–16.0)
Total oxygen content: 69.4 mL/dL

## 2022-01-19 LAB — CBC
HCT: 26.7 % — ABNORMAL LOW (ref 39.0–52.0)
Hemoglobin: 8.9 g/dL — ABNORMAL LOW (ref 13.0–17.0)
MCH: 27.7 pg (ref 26.0–34.0)
MCHC: 33.3 g/dL (ref 30.0–36.0)
MCV: 83.2 fL (ref 80.0–100.0)
Platelets: 206 10*3/uL (ref 150–400)
RBC: 3.21 MIL/uL — ABNORMAL LOW (ref 4.22–5.81)
RDW: 17.7 % — ABNORMAL HIGH (ref 11.5–15.5)
WBC: 9.7 10*3/uL (ref 4.0–10.5)
nRBC: 3.3 % — ABNORMAL HIGH (ref 0.0–0.2)

## 2022-01-19 LAB — D-DIMER, QUANTITATIVE: D-Dimer, Quant: 9.39 ug/mL-FEU — ABNORMAL HIGH (ref 0.00–0.50)

## 2022-01-19 LAB — PROTEIN, BODY FLUID (OTHER): Total Protein, Body Fluid Other: 2.9 g/dL

## 2022-01-19 LAB — APTT: aPTT: 39 seconds — ABNORMAL HIGH (ref 24–36)

## 2022-01-19 LAB — LACTIC ACID, PLASMA: Lactic Acid, Venous: 0.8 mmol/L (ref 0.5–1.9)

## 2022-01-19 LAB — LD, BODY FLUID (OTHER): LD, Body Fluid: 139 IU/L

## 2022-01-19 LAB — PROTIME-INR
INR: 1.4 — ABNORMAL HIGH (ref 0.8–1.2)
Prothrombin Time: 16.9 seconds — ABNORMAL HIGH (ref 11.4–15.2)

## 2022-01-19 LAB — GLUCOSE, CAPILLARY: Glucose-Capillary: 145 mg/dL — ABNORMAL HIGH (ref 70–99)

## 2022-01-19 MED ORDER — HEPARIN (PORCINE) 25000 UT/250ML-% IV SOLN
1800.0000 [IU]/h | INTRAVENOUS | Status: AC
  Administered 2022-01-19 – 2022-01-21 (×3): 1600 [IU]/h via INTRAVENOUS
  Filled 2022-01-19 (×3): qty 250

## 2022-01-19 MED ORDER — IOHEXOL 350 MG/ML SOLN
80.0000 mL | Freq: Once | INTRAVENOUS | Status: AC | PRN
  Administered 2022-01-19: 80 mL via INTRAVENOUS

## 2022-01-19 MED ORDER — POTASSIUM CHLORIDE CRYS ER 20 MEQ PO TBCR
20.0000 meq | EXTENDED_RELEASE_TABLET | Freq: Once | ORAL | Status: AC
  Administered 2022-01-19: 20 meq via ORAL
  Filled 2022-01-19: qty 1

## 2022-01-19 MED ORDER — HEPARIN BOLUS VIA INFUSION
6000.0000 [IU] | Freq: Once | INTRAVENOUS | Status: AC
  Administered 2022-01-19: 6000 [IU] via INTRAVENOUS
  Filled 2022-01-19: qty 6000

## 2022-01-19 MED ORDER — OXYCODONE HCL 5 MG PO TABS
5.0000 mg | ORAL_TABLET | ORAL | Status: DC | PRN
  Administered 2022-01-19 – 2022-01-21 (×8): 10 mg via ORAL
  Administered 2022-01-21 – 2022-01-23 (×9): 5 mg via ORAL
  Administered 2022-01-23: 10 mg via ORAL
  Administered 2022-01-23: 5 mg via ORAL
  Administered 2022-01-24 – 2022-01-25 (×6): 10 mg via ORAL
  Administered 2022-01-25: 5 mg via ORAL
  Administered 2022-01-26: 10 mg via ORAL
  Filled 2022-01-19 (×2): qty 1
  Filled 2022-01-19 (×4): qty 2
  Filled 2022-01-19 (×2): qty 1
  Filled 2022-01-19: qty 2
  Filled 2022-01-19: qty 1
  Filled 2022-01-19 (×2): qty 2
  Filled 2022-01-19: qty 1
  Filled 2022-01-19 (×4): qty 2
  Filled 2022-01-19 (×2): qty 1
  Filled 2022-01-19 (×4): qty 2
  Filled 2022-01-19 (×3): qty 1
  Filled 2022-01-19 (×2): qty 2

## 2022-01-19 MED ORDER — FUROSEMIDE 10 MG/ML IJ SOLN
40.0000 mg | Freq: Two times a day (BID) | INTRAMUSCULAR | Status: DC
  Administered 2022-01-19 – 2022-01-21 (×4): 40 mg via INTRAVENOUS
  Filled 2022-01-19 (×4): qty 4

## 2022-01-19 MED ORDER — FUROSEMIDE 10 MG/ML IJ SOLN
40.0000 mg | Freq: Once | INTRAMUSCULAR | Status: AC
  Administered 2022-01-19: 40 mg via INTRAVENOUS
  Filled 2022-01-19: qty 4

## 2022-01-19 NOTE — Telephone Encounter (Signed)
Patient is currently admitted. Was able to get patient scheduled for 03/23/22 at 230pm in 30 min slot with Dr. Thora Lance.  ?

## 2022-01-19 NOTE — Assessment & Plan Note (Addendum)
Started on Rocephin and Zithromax. ?Antibiotic was changed to cefepime due to worsening leukocytosis, add Pseudomonas coverage given his history of alcoholism and just recently in recovery. ?--Continue cefepime. ?--Supportive care. ?--?R parapneumonic effusion ?

## 2022-01-19 NOTE — Progress Notes (Addendum)
? ?                                                                                                                                                     ?                                                   ?Daily Progress Note  ? ?Patient Name: Mario Beck       Date: 01/19/2022 ?DOB: 02-14-1981  Age: 41 y.o. MRN#: [id number] ?Attending Physician: Pennie Banter, DO ?Primary Care Physician: Pcp, No ?Admit Date: 01/17/2022 ? ?Reason for Consultation/Follow-up: Establishing goals of care ? ?Subjective: ?Patient is sitting in bed talking on phone; he does not hang up upon my entry. He states he has pain in his right abdomen. He states it is worse with a deep breath and with movement. He states he is not happy with the pills and wants the IV pain medication.  ? ?Length of Stay: 2 ? ?Current Medications: ?Scheduled Meds:  ? Chlorhexidine Gluconate Cloth  6 each Topical Daily  ? folic acid  1 mg Oral Daily  ? furosemide  40 mg Intravenous BID  ? heparin injection (subcutaneous)  5,000 Units Subcutaneous Q8H  ? lidocaine  1 patch Transdermal Q24H  ? multivitamin with minerals  1 tablet Oral Daily  ? nicotine  21 mg Transdermal Daily  ? sodium chloride flush  3 mL Intravenous Q12H  ? thiamine  100 mg Oral Daily  ? ? ?Continuous Infusions: ? sodium chloride Stopped (01/17/22 2350)  ? azithromycin (ZITHROMAX) 500 MG IVPB (Vial-Mate Adaptor) Stopped (01/18/22 1935)  ? cefTRIAXone (ROCEPHIN)  IV 2 g (01/19/22 1505)  ? milrinone 0.125 mcg/kg/min (01/19/22 1500)  ? ? ?PRN Meds: ?acetaminophen **OR** acetaminophen, hydrOXYzine, ondansetron (ZOFRAN) IV, oxyCODONE, polyethylene glycol ? ?Physical Exam ?Pulmonary:  ?   Effort: Pulmonary effort is normal.  ?Neurological:  ?   Mental Status: He is alert.  ?         ? ?Vital Signs: BP 117/77   Pulse (!) 114   Temp 98.7 ?F (37.1 ?C) (Oral)   Resp (!) 24   Ht 6\' 3"  (1.905 m)   Wt 94.8 kg   SpO2 96%   BMI 26.12 kg/m?  ?SpO2: SpO2: 96 % ?O2 Device: O2 Device: Room Air ?O2 Flow Rate: O2  Flow Rate (L/min): 2 L/min ? ?Intake/output summary:  ?Intake/Output Summary (Last 24 hours) at 01/19/2022 1609 ?Last data filed at 01/19/2022 1500 ?Gross per 24 hour  ?Intake 1167.49 ml  ?Output 5125 ml  ?Net -3957.51 ml  ? ?LBM: Last BM Date :  (PTA) ?Baseline Weight: Weight: 88.5 kg ?Most recent weight:  Weight: 94.8 kg ? ? ?Patient Active Problem List  ? Diagnosis Date Noted  ? Cardiogenic shock (HCC) 01/19/2022  ? Pleuritic chest pain 01/19/2022  ? CAP (community acquired pneumonia) 01/19/2022  ? Pleural effusion on left 01/19/2022  ? Acute on chronic HFrEF (heart failure with reduced ejection fraction) (HCC) 01/17/2022  ? Syncope 01/17/2022  ? AKI (acute kidney injury) (HCC) 01/17/2022  ? Hyperkalemia 01/17/2022  ? Lactic acidosis 01/17/2022  ? Hypokalemia 06/18/2021  ? COVID-19 virus infection 06/18/2021  ? Hypomagnesemia 06/18/2021  ? Acute on chronic systolic CHF (congestive heart failure) (HCC) 06/17/2021  ? Acute on chronic combined systolic and diastolic CHF (congestive heart failure) (HCC) 06/17/2021  ? Epigastric pain   ? Alcohol withdrawal syndrome without complication (HCC)   ? 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 Alcoholic cardiomyopathy (HCC) 38/46/6599  ? Alcoholic pancreatitis 09/16/2020  ? Tobacco abuse 09/16/2020  ? Abnormal EKG 09/16/2020  ? GERD (gastroesophageal reflux disease) 09/16/2020  ? Depression 09/16/2020  ? Dark stools 09/16/2020  ? Elevated troponin 09/16/2020  ? Transaminitis 03/31/2020  ? Nausea and vomiting 02/21/2020  ? Intractable nausea and vomiting 02/21/2020  ? Tobacco abuse counseling   ? Acute alcoholic pancreatitis 02/18/2020  ? HTN (hypertension), malignant 02/18/2020  ? Alcohol use disorder, moderate, dependence (HCC) 02/18/2020  ? Benign essential HTN 12/01/2015  ? ? ?Palliative Care Assessment & Plan  ? ? ?Recommendations/Plan: ?Continue current full code/full scope care ? ?Code Status: ? ?  ?Code Status Orders  ?(From admission, onward)  ?  ? ? ?  ? ?  Start     Ordered   ? 01/17/22 1450  Full code  Continuous       ? 01/17/22 1451  ? ?  ?  ? ?  ? ?Code Status History   ? ? Date Active Date Inactive Code Status Order ID Comments User Context  ? 06/17/2021 2258 06/21/2021 1900 Full Code [id number]  Therisa Doyne, MD ED  ? 05/28/2021 0902 05/29/2021 1911 Full Code [id number]  Carollee Herter, DO ED  ? 05/28/2021 0802 05/28/2021 0902 Full Code [id number]  Carollee Herter, DO ED  ? 02/19/2021 2229 02/21/2021 2326 Full Code [id number]  Mansy, Vernetta Honey, MD ED  ? 09/16/2020 1437 09/18/2020 1821 Full Code [id number]  Lorretta Harp, MD ED  ? 03/31/2020 1754 04/03/2020 1656 Full Code [id number]  Eddie North, MD ED  ? 02/21/2020 0214 02/23/2020 1658 Full Code [id number]  Mansy, Vernetta Honey, MD ED  ? 02/18/2020 0115 02/19/2020 1958 Full Code [id number]  Andris Baumann, MD ED  ? 07/17/2018 0646 07/17/2018 1006 Full Code [id number]  Ward, Layla Maw, DO ED  ? ?  ? ? ?Prognosis: ? < 6 months ? ? ?Thank you for allowing the Palliative Medicine Team to assist in the care of this patient. ? ?ment and plan. ? ?Morton Stall, NP ? ?Please contact Palliative Medicine Team phone at [phone] for questions and concerns.  ? ? ? ? ? ?

## 2022-01-19 NOTE — Assessment & Plan Note (Signed)
As outlined above °

## 2022-01-19 NOTE — Progress Notes (Signed)
PHARMACY CONSULT NOTE - FOLLOW UP ? ?Pharmacy Consult for Electrolyte Monitoring and Replacement  ? ?Recent Labs: ?Potassium (mmol/L)  ?Date Value  ?01/19/2022 3.8  ? ?Magnesium (mg/dL)  ?Date Value  ?01/18/2022 2.0  ? ?Calcium (mg/dL)  ?Date Value  ?01/19/2022 8.3 (L)  ? ?Albumin (g/dL)  ?Date Value  ?01/17/2022 3.3 (L)  ? ?Phosphorus (mg/dL)  ?Date Value  ?01/18/2022 4.1  ? ?Sodium (mmol/L)  ?Date Value  ?01/19/2022 136  ? ?Corrected Calcium: 8.86 mg/dl ?Scr 2.40 > 1.20 (BL 1.03) ? ?Assessment: ?41YOM presenting with dyspnea, fatigue, and weight gain. PMH CHF EF 30%, alcohol dependence, multiple episodes alcoholic pancreatitis, cigarette smoking, GERD, depression. In ICU with cardiogenic shock due to acute decompensated systolic heart failure.  ? ?Milrinone 0.25 mcg/kg/min ? ?Goal of Therapy:  ?Lytes WNL ?Mag > 2 ?K 4-5 ? ?Plan:  ?K 3.8. Kcl PO 20 meq x 1 ?Follow up labs tomorrow AM. ? ?Jaynie Bream, PharmD ?Pharmacy Resident  ?01/19/2022 ?9:11 AM ? ?

## 2022-01-19 NOTE — Telephone Encounter (Signed)
Can we set up follow up  with me in 10 weeks? ?  ?

## 2022-01-19 NOTE — Progress Notes (Addendum)
? ?Progress Note ? ?Patient Name: Mario Beck ?Date of Encounter: 01/19/2022 ? ?[facility] Cardiologist: [facility] ? ?Subjective  ? ?S/p L thoracentesis yesterday. UOP -1.9L on milrinone 0.15mcg.BP soft at times and HR up. Patient reports pleuritic right sided chest pain.  ? ?Inpatient Medications  ?  ?Scheduled Meds: ? Chlorhexidine Gluconate Cloth  6 each Topical Daily  ? folic acid  1 mg Oral Daily  ? heparin injection (subcutaneous)  5,000 Units Subcutaneous Q8H  ? lidocaine  1 patch Transdermal Q24H  ? multivitamin with minerals  1 tablet Oral Daily  ? nicotine  21 mg Transdermal Daily  ? sodium chloride flush  3 mL Intravenous Q12H  ? thiamine  100 mg Oral Daily  ? ?Continuous Infusions: ? sodium chloride Stopped (01/17/22 2350)  ? azithromycin (ZITHROMAX) 500 MG IVPB (Vial-Mate [employer]or) Stopped (01/18/22 1935)  ? cefTRIAXone (ROCEPHIN)  IV Stopped (01/18/22 1804)  ? milrinone 0.125 mcg/kg/min (01/19/22 1000)  ? ?PRN Meds: ?acetaminophen **OR** acetaminophen, hydrOXYzine, LORazepam **OR** LORazepam, ondansetron (ZOFRAN) IV, oxyCODONE, polyethylene glycol  ? ?Vital Signs  ?  ?Vitals:  ? 01/19/22 0800 01/19/22 0900 01/19/22 1000 01/19/22 1121  ?BP: 109/77 116/74 113/70 105/77  ?Pulse: (!) 107 (!) 105 (!) 105 (!) 111  ?Resp:  (!) 21 (!) 26 (!) 26  ?Temp:      ?TempSrc:      ?SpO2:  94% 96% 96%  ?Weight:      ?Height:      ? ? ?Intake/Output Summary (Last 24 hours) at 01/19/2022 1133 ?Last data filed at 01/19/2022 1000 ?Gross per 24 hour  ?Intake 1217.43 ml  ?Output 3100 ml  ?Net -1882.57 ml  ? ? ?  01/19/2022  ?  5:00 AM 01/18/2022  ?  5:00 AM 01/17/2022  ? 10:00 PM  ?Last 3 Weights  ?Weight (lbs) 208 lb 15.9 oz 211 lb 3.2 oz 210 lb 15.7 oz  ?Weight (kg) 94.8 kg 95.8 kg 95.7 kg  ?   ? ?Telemetry  ?  ?SR heart rate around 100bpm - Personally Reviewed ? ?ECG  ?  ?No new - Personally Reviewed ? ?Physical Exam  ? ?GEN: No acute distress.   ?Neck: No JVD ?Cardiac: RRR, no murmurs, rubs, or gallops.  ?Respiratory: diminished  at bases. ?GI: Soft, nontender, non-distended  ?MS: No edema; No deformity. ?Neuro:  Nonfocal  ?Psych: Normal affect  ? ?Labs  ?  ?High Sensitivity Troponin:   ?Recent Labs  ?Lab 01/17/22 ?1932 01/17/22 ?2252 01/18/22 ?2774 01/18/22 ?0755  ?TROPONINIHS 28* 29* 32* 39*  ?   ?Chemistry ?Recent Labs  ?Lab 01/17/22 ?0925 01/17/22 ?1506 01/17/22 ?1932 01/18/22 ?1287 01/18/22 ?8676 01/19/22 ?0449  ?NA 136  --  136 139  --  136  ?K 6.1*  --  6.0* 4.4  --  3.8  ?CL 101  --  100 103  --  102  ?CO2 23  --  18* 24  --  27  ?GLUCOSE 115*  --  124* 120*  --  144*  ?BUN 30*  --  30* 33*  --  19  ?CREATININE 2.53*   < > 2.40* 2.30*  --  1.20  ?CALCIUM 8.6*  --  8.5* 8.5*  --  8.3*  ?MG 2.3  --   --   --  2.0  --   ?PROT  --   --  7.3  --   --   --   ?ALBUMIN  --   --  3.3*  --   --   --   ?  AST  --   --  94*  --   --   --   ?ALT  --   --  42  --   --   --   ?ALKPHOS  --   --  314*  --   --   --   ?BILITOT  --   --  1.8*  --   --   --   ?GFRNONAA 32*   < > 34* 36*  --  >60  ?ANIONGAP 12  --  18* 12  --  7  ? < > = values in this interval not displayed.  ?  ?Lipids No results for input(s): CHOL, TRIG, HDL, LABVLDL, LDLCALC, CHOLHDL in the last 168 hours.  ?Hematology ?Recent Labs  ?Lab 01/17/22 ?0925 01/19/22 ?0449  ?WBC 8.1 9.7  ?RBC 3.34* 3.21*  ?HGB 9.4* 8.9*  ?HCT 28.7* 26.7*  ?MCV 85.9 83.2  ?MCH 28.1 27.7  ?MCHC 32.8 33.3  ?RDW 18.5* 17.7*  ?PLT 168 206  ? ?Thyroid No results for input(s): TSH, FREET4 in the last 168 hours.  ?BNP ?Recent Labs  ?Lab 01/17/22 ?0925  ?BNP 2,253.2*  ?  ?DDimer No results for input(s): DDIMER in the last 168 hours.  ? ?Radiology  ?  ?US RENAL ? ?Result Date: 01/17/2022 ?CLINICAL DATA:  A 41 year old male presents with history of acute kidney injury. EXAM: RENAL / URINARY TRACT ULTRASOUND COMPLETE COMPARISON:  CT of the abdomen and pelvis from December 08, 2021. FINDINGS: Right Kidney: Renal measurements: 11.3 x 5.1 x 4.5 cm = volume: 136 mL. Trace perinephric fluid or edema adjacent to the lower pole  the RIGHT kidney. No hydronephrosis. No parenchymal thinning. Mild increased cortical echogenicity and decreased corticomedullary differentiation. Left Kidney: Renal measurements: 11.7 x 5.8 x 5.3 cm = volume: 189 mL. Decreased corticomedullary differentiation. Question of mild increased cortical echogenicity. No hydronephrosis. No perinephric fluid. Bladder: Urinary bladder is under distended limiting assessment. Other: Gallbladder under distended. The wall thickening up to 9 mm. Trace pericholecystic fluid. Liver contour mildly nodular, parenchymal pattern mildly heterogeneous. LEFT pleural effusion. Adrenal nodules better seen on recent CT imaging. IMPRESSION: No hydronephrosis. Decreased corticomedullary differentiation and increased cortical echogenicity in keeping with diagnosis of medical renal disease. Trace perinephric fluid on the RIGHT also likely related to this process. Gallbladder wall thickening and small amount of pericholecystic fluid. This may relate to cardiogenic edema or underlying liver disease. Correlate with any pain in this area with further imaging as warranted. Areas not fully assessed. LEFT effusion further supporting the possibility of heart failure or volume overload. Parenchymal changes of the liver that suggests underlying liver disease, perhaps cardiogenic. 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 Could consider 1 year follow-up with CT utilizing adrenal protocol. Electronically Signed   By: Donzetta Kohut M.D.   On: 01/17/2022 16:19  ? ?DG Chest Port 1 View ? ?Result Date: 01/18/2022 ?CLINICAL DATA:  Bedside thoracentesis. EXAM: PORTABLE CHEST 1 VIEW COMPARISON:  Chest radiograph January 17, 2022 FINDINGS: Right IJ central venous catheter with tip overlying the superior cavoatrial junction. Similar size of the small bilateral pleural effusions with adjacent atelectasis versus  consolidations. No visible pneumothorax. Similar cardiomegaly and central vascular prominence. The visualized skeletal structures are unchanged. IMPRESSION: 1. Similar size of the small bilateral pleural effusions with adjacent atelectasis versus consolidations. No visible pneumothorax. 2. Similar cardiomegaly and central vascular prominence. Electronically Signed  By: Maudry Mayhew M.D.   On: 01/18/2022 13:19  ? ?DG Chest Port 1 View ? ?Result Date: 01/17/2022 ?CLINICAL DATA:  Central line EXAM: PORTABLE CHEST 1 VIEW COMPARISON:  01/17/2022 chest x-ray and CT FINDINGS: Right IJ central venous catheter tip over the SVC origin. No pneumothorax. Mild cardiomegaly with small moderate left effusion and airspace disease at the left base IMPRESSION: 1. Right IJ central venous catheter tip over the SVC origin. No pneumothorax 2. Mild cardiomegaly with left effusion and left basilar airspace disease. Electronically Signed   By: Jasmine Pang M.D.   On: 01/17/2022 23:11  ? ?ECHOCARDIOGRAM COMPLETE ? ?Result Date: 01/18/2022 ?   ECHOCARDIOGRAM REPORT   Patient Name:   Mario Beck Date of Exam: 01/18/2022 Medical Rec #:  [id number]     Height:       75.0 in Accession #:    [id number]    Weight:       211.2 lb Date of Birth:  06/11/1981     BSA:          2.246 m? Patient Age:    41 years      BP:           124/91 mmHg Patient Gender: M             HR:           100 bpm. Exam Location:  [facility] Procedure: 2D Echo, Cardiac Doppler and Color Doppler Indications:     R06.00 Dyspnea  History:         Patient has prior history of Echocardiogram examinations. CHF;                  Risk Factors:Hypertension.  Sonographer:     Ceasar Mons Referring Phys:  [id number] AMIN Diagnosing Phys: Yvonne Kendall MD  Sonographer Comments: Image acquisition challenging due to uncooperative patient. IMPRESSIONS  1. Left ventricular ejection fraction, by estimation, is 20 to 25%. The left ventricle has severely decreased function. The left ventricle  demonstrates global hypokinesis. The left ventricular internal cavity size was severely dilated. Indeterminate diastolic filling due to E-A fusion.  2. Right ventricular systolic function is modera

## 2022-01-19 NOTE — Consult Note (Signed)
ANTICOAGULATION CONSULT NOTE ? ?Pharmacy Consult for IV Heparin ?Indication: pulmonary embolus ? ?Patient Measurements: ?Height: 6\' 3"  (190.5 cm) ?Weight: 94.8 kg (208 lb 15.9 oz) ?IBW/kg (Calculated) : 84.5 ?Heparin Dosing Weight: 94.8 kg ? ?Labs: ?Recent Labs  ?  01/17/22 ?0925 01/17/22 ?1506 01/17/22 ?1932 01/17/22 ?2252 01/18/22 ?03/20/22 01/18/22 ?03/20/22 01/19/22 ?0449  ?HGB 9.4*  --   --   --   --   --  8.9*  ?HCT 28.7*  --   --   --   --   --  26.7*  ?PLT 168  --   --   --   --   --  206  ?CREATININE 2.53*   < > 2.40*  --  2.30*  --  1.20  ?TROPONINIHS  --    < > 28* 29* 32* 39*  --   ? < > = values in this interval not displayed.  ? ? ?Estimated Creatinine Clearance: 98.8 mL/min (by C-G formula based on SCr of 1.2 mg/dL). ? ? ?Medications:  ?No anticoagulation prior to admission per my chart review. Patient has been receiving [place] heparin for DVT prophylaxis while hospitalized. ? ?Assessment: ?Patient is a 41 y/o M with medical history including tobacco use disorder, alcohol use disorder, CHF who was admitted 01/17/22 with cardiogenic shock. D-dimer elevated and persistent tachycardia. Subsequently found to have "acute pulmonary embolism in the multiple segmental and subsegmental branches in the right lung. There is small thrombus burden. There are no signs of acute right ventricular strain". Pharmacy consulted to initiate heparin infusion for acute PE. ? ?Baseline aPTT and PT-INR are pending. Baseline CBC notable for anemia (Hgb 9.4 >> 8.9).  ? ?Goal of Therapy:  ?Heparin level 0.3-0.7 units/ml ?Monitor platelets by anticoagulation protocol: Yes ?  ?Plan:  ?--Heparin 6000 unit IV bolus followed by continuous infusion at 1600 units/hr ?--Heparin level 6 hours after initiation of infusion ?--Daily CBC per protocol while on IV heparin ? ?03/19/22 ?01/19/2022,7:07 PM ? ? ?

## 2022-01-19 NOTE — Progress Notes (Signed)
Brief PCCM Progress Note ? ?Cytology, cx, and some pleural fluid chemistries pending.  ? ?I will order repeat CT Chest in 8 weeks and clinic follow up with me in 10 weeks. ? ?Will sign off but glad to be reinvolved as condition changes. ? ?Mario Beck  ?[facility] ? ?

## 2022-01-19 NOTE — Hospital Course (Addendum)
HPI taken from ICU notes: ?"41 yo male with a history of cardiomyopathy presented to the [facility] ER on 4/3 complaining of 3-4 days of dyspnea, fatigue and weight gain. He says that he has been compliant with his medications which include furosemide, metoprolol, nexium, potassium and sertraline.  However he says that the lasix has not been working for him in the last week or so and his urine output has become very minimal and dark.  He has noted increasing dyspnea when he climbs a flight of stairs in his apartment and yesterday passed out twice when he was walking across the parking lot.   ?He was admitted to [facility] for alcohol detox on 3/21, treated with lithium, and discharged on 3/24.  He says that he has been compliant with his medications. ?He was supposed to see Dr. Humphrey Rolls with cardiology after his last discharge for alcoholic pancreatitis, acute decompensated CHF in 06/2021, but he missed his appointments and has not followed up.  ?He smokes 1 pack of cigarettes daily.  He quit drinking on 3/21 and has not had alcohol since then.  He denies illicit drug use." ? ?Patient was admitted to ICU with cardiogenic shock. ?Cardiology consulted.   ?Plan for L/R heart cath once euvolemic. ?Currently on milrinone and IV Lasix.   ?Weaned off Levophed.  ? ?Transferred to [facility] service 4/5. ? ?Found to have acute PE on the right with probable pulmonary infarct.  Started on heparin drip, then transitioned to Eliquis. ? ?4/10: despite several days Cefepime, WBC increasing.  Repeat CXR with worsened R-sided infiltrates and parapneumonia effusion.   ?

## 2022-01-19 NOTE — Assessment & Plan Note (Addendum)
Seems musculoskeletal.  Initially low suspicion for PE, patient had no hypoxia.  He later became hypoxic requiring 3 L/min and noted to have persistent tachycardia with worsening pleuritic chest pain. ?D-dimer is elevated above 9. ?Subsequent CTA chest confirmed right-sided PE.  Also has a R parapneumonic effusion s/p thora today (4/11). ?--Now on anticoagulation as outlined ?-- Supportive care with analgesia as needed ?-- Incentive spirometer ?

## 2022-01-19 NOTE — TOC Initial Note (Signed)
Transition of Care (TOC) - Initial/Assessment Note  ? ? ?Patient Details  ?Name: Mario Beck ?MRN: [id number] ?Date of Birth: 07-Jan-1981 ? ?Transition of Care (TOC) CM/SW Contact:    ?Shelbie Hutching, RN ?Phone Number: ?01/19/2022, 4:09 PM ? ?Clinical Narrative:                 ?Patient admitted to the hospital with CHF acute on chronic.  Patient reports history of CHF and he did see a heart doctor once but not after that. ?RNCM met with patient and his girlfriend Danielle at the bedside, introduced self and explained role in DC planning. ?Patient and girlfriend live together, he is not currently working, he is independent at home and drives.  Patient does not have insurance or a PCP.  RNCM provided patient with information on Free and Low cost healthcare options in [facility] with a list of community clinics to establish primary care.  Also gave them information on the [employer] and [employer] since they are in [place].  He wants to pursue care in the [facility], as long as his doctors are in [place] he can utilize [facility] for his prescriptions.  Gave them the application for [facility].  Also provided patient with information on [facility] Financial assistance programs.   ? ?TOC will follow.   ? ?Expected Discharge Plan: Home/Self Care ?Barriers to Discharge: Continued Medical Work up ? ? ?Patient Goals and CMS Choice ?Patient states their goals for this hospitalization and ongoing recovery are:: to get better ?  ?  ? ?Expected Discharge Plan and Services ?Expected Discharge Plan: Home/Self Care ?  ?Discharge Planning Services: CM Consult, [facility], Medication Assistance ?  ?Living arrangements for the past 2 months: Apartment ?                ?DME Arranged: N/A ?DME Agency: NA ?  ?  ?  ?HH Arranged: NA ?[place] Agency: NA ?  ?  ?  ? ?Prior Living Arrangements/Services ?Living arrangements for the past 2 months: Apartment ?Lives with::  Significant Other ?Patient language and need for interpreter reviewed:: Yes ?Do you feel safe going back to the place where you live?: Yes      ?Need for Family Participation in Patient Care: Yes (Comment) ?Care giver support system in place?: Yes (comment) ?  ?Criminal Activity/Legal Involvement Pertinent to Current Situation/Hospitalization: No - Comment as needed ? ?Activities of Daily Living ?  ?  ? ?Permission Sought/Granted ?Permission sought to share information with : Case Manager, Family Supports ?Permission granted to share information with : Yes, Verbal Permission Granted ? Share Information with NAME: Ralene Bathe ?   ? Permission granted to share info w Relationship: significant other ? Permission granted to share info w Contact Information: [phone] ? ?Emotional Assessment ?Appearance:: Appears stated age ?Attitude/Demeanor/Rapport: Engaged ?Affect (typically observed): Accepting ?Orientation: : Oriented to Self, Oriented to Place, Oriented to  Time, Oriented to Situation ?Alcohol / Substance Use: Not Applicable ?Psych Involvement: No (comment) ? ?Admission diagnosis:  Hyperkalemia [E87.5] ?Acute renal injury ([place]) [N17.9] ?Anemia of unknown etiology [D64.9] ?Acute decompensated heart failure (HCC) [I50.9] ?Multifocal pneumonia [J18.9] ?Acute on chronic HFrEF (heart failure with reduced ejection fraction) ([place]) [I50.23] ?Patient Active Problem List  ? Diagnosis Date Noted  ? Cardiogenic shock ([place]) 01/19/2022  ? Pleuritic chest pain 01/19/2022  ? CAP (community acquired pneumonia) 01/19/2022  ? Pleural effusion on left 01/19/2022  ? Acute on chronic HFrEF (heart  failure with reduced ejection fraction) ([place]) 01/17/2022  ? Syncope 01/17/2022  ? AKI (acute kidney injury) ([place]) 01/17/2022  ? Hyperkalemia 01/17/2022  ? Lactic acidosis 01/17/2022  ? Hypokalemia 06/18/2021  ? COVID-19 virus infection 06/18/2021  ? Hypomagnesemia 06/18/2021  ? Acute on chronic systolic CHF (congestive heart failure)  ([place]) 06/17/2021  ? Acute on chronic combined systolic and diastolic CHF (congestive heart failure) ([place]) 06/17/2021  ? Epigastric pain   ? Alcohol withdrawal syndrome without complication ([place])   ? 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 Alcoholic pancreatitis 05/07/5749  ? Tobacco abuse 09/16/2020  ? Abnormal EKG 09/16/2020  ? GERD (gastroesophageal reflux disease) 09/16/2020  ? Depression 09/16/2020  ? Dark stools 09/16/2020  ? Elevated troponin 09/16/2020  ? Transaminitis 03/31/2020  ? Nausea and vomiting 02/21/2020  ? Intractable nausea and vomiting 02/21/2020  ? Tobacco abuse counseling   ? Acute alcoholic pancreatitis 51/83/3582  ? HTN (hypertension), malignant 02/18/2020  ? Alcohol use disorder, moderate, dependence ([place]) 02/18/2020  ? Benign essential HTN 12/01/2015  ? ?PCP:  Pcp, No ?Pharmacy:   ?[facility] ?[address] ?[place] [address] ?Phone: [phone] Fax: [phone] ? ?[id number] [address] ?[employer] [place] ?[place] [address] ?Phone: [phone] Fax: [phone] ? ?[id number]?Phone: [phone]: [phone]? ? ? ? ?Social Determinants of Health (SDOH) Interventions ?  ? ?Readmission Risk Interventions ? ?  01/19/2022  ?  4:03 PM 06/19/2021  ?  9:48 AM  ?Readmission Risk Prevention Plan  ?Transportation Screening Complete Complete  ?PCP or Specialist Appt within 5-7 Days Complete Complete  ?Home Care Screening Complete Complete  ?Medication Review (RN CM) Complete Complete  ? ? ? ?

## 2022-01-19 NOTE — Progress Notes (Signed)
?Progress Note ? ? ?Patient: Mario Beck QIO:[id number] DOB: October 07, 1981 DOA: 01/17/2022     2 ?DOS: the patient was seen and examined on 01/19/2022 ?  ?Brief hospital course: ?HPI taken from ICU notes: ?"41 yo male with a history of cardiomyopathy presented to the [facility] ER on 4/3 complaining of 3-4 days of dyspnea, fatigue and weight gain. He says that he has been compliant with his medications which include furosemide, metoprolol, nexium, potassium and sertraline.  However he says that the lasix has not been working for him in the last week or so and his urine output has become very minimal and dark.  He has noted increasing dyspnea when he climbs a flight of stairs in his apartment and yesterday passed out twice when he was walking across the parking lot.   ?He was admitted to [facility] for alcohol detox on 3/21, treated with lithium, and discharged on 3/24.  He says that he has been compliant with his medications. ?He was supposed to see Dr. Welton Flakes with cardiology after his last discharge for alcoholic pancreatitis, acute decompensated CHF in 06/2021, but he missed his appointments and has not followed up.  ?He smokes 1 pack of cigarettes daily.  He quit drinking on 3/21 and has not had alcohol since then.  He denies illicit drug use." ? ?Patient was admitted to ICU with cardiogenic shock. ?Cardiology consulted.   ?Plan for L/R heart cath once euvolemic. ?Currently on milrinone and IV Lasix.   ?Has been weaned off Levophed.  ? ?Transferred to [facility] service 4/5. ? ?Assessment and Plan: ?* Acute on chronic HFrEF (heart failure with reduced ejection fraction) (HCC) ?Admitted to ICU with cardiogenic shock 4/3. ?Presented with worsening dyspnea and orthopnea, weight gain, elevated BNP 2253, and bilateral pleural effusions. ?Most recent echo with EF less than 20% with moderate to severe MR. ?Likely nonischemic cardiomyopathy, but needs ischemic evaluation. ?-- Management per cardiology ?-- On milrinone, IV Lasix 40 mg  twice daily ?-- Weaned off Levophed ?-- Strict I/O's, Daily weights ?-- Plan for L/R heart cath once euvolemic ? ?Net IO Since Admission: -3,336.57 mL [01/19/22 1431] ? ? ?Cardiogenic shock (HCC) ?As outlined above ? ?Pleuritic chest pain ?Seems musculoskeletal.  Recovering from AKI, hope to avoid CTA chest.  Low suspicion for PE at this time given patient on room air. ?-- Check D-dimer ?-- Consider lower extremity Dopplers ?-- Consider CTA chest once renal function further improved ?-- Supportive care with analgesia as needed ?-- Incentive spirometer ? ?Pleural effusion on left ?Status post bedside thoracentesis on 4/4 with 850 cc removed. ?-- Follow-up pleural fluid culture and cytology ? ?AKI (acute kidney injury) (HCC) ?POA, resolved.  Likely prerenal etiology given increased home dose Lasix in days prior to admission. ?--Monitor BMP ? ?Hyperkalemia ?Resolved.  POA with potassium 6.1 in the setting of worsening renal function and potassium supplementation at home with his diuretic.  No EKG changes.   ?K 3.8 ?-- Monitor BMP ? ?Syncope ?On admission, patient reported feeling dizzy and someone was able to hold him before falling down.  No chest pain.  Recently increased Lasix.  Patient has very low EF. ?Likely due to poor cardiac output and hypotension. ?-- Monitor orthostatic vitals ?-- Further evaluation pending clinical course ? ?Lactic acidosis ?POA, resolved.   ?Most likely secondary to decreased perfusio in the setting of cardiogenic shock n.  No leukocytosis or fever.  CT chest was concerning for multifocal pneumonia, started on empiric antibiotics on admission. ? ? ?HTN (  hypertension), malignant ?Admitted with cardiogenic shock, required Levophed. ?Home regimen metoprolol 100 mg daily, isosorbide, hydralazine 25 mg 3 times a day and Lasix 40 mg twice daily at home. ?--Currently on milrinone ?--Hold antihypertensives ? ? ?Alcohol use disorder, moderate, dependence (HCC) ?Last drink 8 days prior to admission.   He was admitted in [facility] for alcohol detox on 3/21, treated with Librium, discharged 3/24.  Denies any alcohol consumption since.  No signs of withdrawal. ?--Stop CIWA ?-- Monitor ? ?Tobacco abuse counseling ?Counseled regarding importance of cessation.  Nicotine patch ordered. ? ?CAP (community acquired pneumonia) ?Continue Rocephin and Zithromax. ?Supportive care. ? ? ? ? ?  ? ?Subjective: Patient seen awake sitting up in bed in ICU.  He reports ongoing right-sided pleuritic chest pain not well controlled with 5 mg oxycodone.  Otherwise denies any acute complaints at this time. ? ?Physical Exam: ?Vitals:  ? 01/19/22 1145 01/19/22 1200 01/19/22 1300 01/19/22 1400  ?BP:  109/83 (!) 121/94 117/77  ?Pulse:  (!) 114 (!) 114   ?Resp:  (!) 28 (!) 34 (!) 24  ?Temp: 98.7 ?F (37.1 ?C)     ?TempSrc: Oral     ?SpO2:  96% 96%   ?Weight:      ?Height:      ? ?General exam: awake, alert, no acute distress ?HEENT: atraumatic, clear conjunctiva, anicteric sclera, moist mucus membranes, hearing grossly normal  ?Respiratory system: CTAB with shallow inspirations, diminished bases bilaterally, tachypneic, no wheezes or rhonchi, on room air. ?Cardiovascular system: Tachycardic, regular rhythm, no pedal edema.   ?Gastrointestinal system: soft, nontender abdomen ?Central nervous system: A&O x4. no gross focal neurologic deficits, normal speech ?Extremities: moves all, no edema, normal tone ?Skin: dry, intact, normal temperature ?Psychiatry: normal mood, congruent affect, judgement and insight appear normal ? ? ? ?Data Reviewed: ? ?Labs reviewed and notable for creatinine improved from 2.30 down to 1.20.  Glucose 144, calcium 8.3.  Normal lactic acid 0.8.  CBC with hemoglobin 8.9. ? ?Family Communication: None at bedside during encounter, will attempt to call as time allows ? ?Disposition: ?Status is: Inpatient ?Remains inpatient appropriate because: Severity of illness remaining in ICU requiring IV therapies as outlined above ? ?  Planned Discharge Destination: Home ? ? ? ?Time spent: 55 minutes including time spent at bedside, and coordination of care and detailed review of prior labs, imaging, prior hospitalizations and medical history ? ?Author: ?Pennie Banter, DO ?01/19/2022 3:13 PM ? ?For on call review [email or web address].  ?

## 2022-01-19 NOTE — Assessment & Plan Note (Addendum)
Status post bedside thoracentesis on 4/4 with 850 cc removed.  Pleural fluid culture negative, cytology negative for malignancy. ?

## 2022-01-20 DIAGNOSIS — I2602 Saddle embolus of pulmonary artery with acute cor pulmonale: Secondary | ICD-10-CM

## 2022-01-20 DIAGNOSIS — J189 Pneumonia, unspecified organism: Secondary | ICD-10-CM

## 2022-01-20 DIAGNOSIS — E872 Acidosis, unspecified: Secondary | ICD-10-CM

## 2022-01-20 LAB — CBC
HCT: 28.4 % — ABNORMAL LOW (ref 39.0–52.0)
Hemoglobin: 9.3 g/dL — ABNORMAL LOW (ref 13.0–17.0)
MCH: 27.4 pg (ref 26.0–34.0)
MCHC: 32.7 g/dL (ref 30.0–36.0)
MCV: 83.5 fL (ref 80.0–100.0)
Platelets: 242 10*3/uL (ref 150–400)
RBC: 3.4 MIL/uL — ABNORMAL LOW (ref 4.22–5.81)
RDW: 18.1 % — ABNORMAL HIGH (ref 11.5–15.5)
WBC: 11.6 10*3/uL — ABNORMAL HIGH (ref 4.0–10.5)
nRBC: 3.1 % — ABNORMAL HIGH (ref 0.0–0.2)

## 2022-01-20 LAB — COOXEMETRY PANEL
Carboxyhemoglobin: 1.4 % (ref 0.5–1.5)
Methemoglobin: <0.7 % (ref 0.0–1.5)
O2 Saturation: 67.9 %
Total hemoglobin: 10 g/dL — ABNORMAL LOW (ref 12.0–16.0)
Total oxygen content: 67 % OF VOL

## 2022-01-20 LAB — CYTOLOGY - NON PAP

## 2022-01-20 LAB — BASIC METABOLIC PANEL
Anion gap: 9 (ref 5–15)
BUN: 13 mg/dL (ref 6–20)
CO2: 29 mmol/L (ref 22–32)
Calcium: 8.5 mg/dL — ABNORMAL LOW (ref 8.9–10.3)
Chloride: 96 mmol/L — ABNORMAL LOW (ref 98–111)
Creatinine, Ser: 1.04 mg/dL (ref 0.61–1.24)
GFR, Estimated: >60 mL/min (ref >60)
Glucose, Bld: 217 mg/dL — ABNORMAL HIGH (ref 70–99)
Potassium: 3.5 mmol/L (ref 3.5–5.1)
Sodium: 134 mmol/L — ABNORMAL LOW (ref 135–145)

## 2022-01-20 LAB — MAGNESIUM: Magnesium: 1.8 mg/dL (ref 1.7–2.4)

## 2022-01-20 LAB — HEPARIN LEVEL (UNFRACTIONATED)
Heparin Unfractionated: 0.5 IU/mL (ref 0.30–0.70)
Heparin Unfractionated: 0.43 IU/mL (ref 0.30–0.70)

## 2022-01-20 LAB — PHOSPHORUS: Phosphorus: 3.6 mg/dL (ref 2.5–4.6)

## 2022-01-20 MED ORDER — MAGNESIUM SULFATE 2 GM/50ML IV SOLN
2.0000 g | Freq: Once | INTRAVENOUS | Status: AC
  Administered 2022-01-20: 2 g via INTRAVENOUS
  Filled 2022-01-20: qty 50

## 2022-01-20 MED ORDER — "THROMBI-PAD 3""X3"" EX PADS": 1.0000 | MEDICATED_PAD | Freq: Once | CUTANEOUS | Status: DC

## 2022-01-20 MED ORDER — POTASSIUM CHLORIDE CRYS ER 20 MEQ PO TBCR
40.0000 meq | EXTENDED_RELEASE_TABLET | Freq: Two times a day (BID) | ORAL | Status: AC
  Administered 2022-01-20 (×2): 40 meq via ORAL
  Filled 2022-01-20 (×2): qty 2

## 2022-01-20 NOTE — Consult Note (Signed)
ANTICOAGULATION CONSULT NOTE ? ?Pharmacy Consult for IV Heparin ?Indication: pulmonary embolus ? ?Patient Measurements: ?Height: 6\' 3"  (190.5 cm) ?Weight: 90.8 kg (200 lb 2.8 oz) ?IBW/kg (Calculated) : 84.5 ?Heparin Dosing Weight: 94.8 kg ? ?Labs: ?Recent Labs  ?  01/17/22 ?2252 01/18/22 ?03/20/22 01/18/22 ?03/20/22 01/19/22 ?0449 01/19/22 ?1941 01/20/22 ?0321 01/20/22 ?0930  ?HGB  --   --   --  8.9*  --  9.3*  --   ?HCT  --   --   --  26.7*  --  28.4*  --   ?PLT  --   --   --  206  --  242  --   ?APTT  --   --   --   --  39*  --   --   ?LABPROT  --   --   --   --  16.9*  --   --   ?INR  --   --   --   --  1.4*  --   --   ?HEPARINUNFRC  --   --   --   --   --  0.50 0.43  ?CREATININE  --  2.30*  --  1.20  --  1.04  --   ?TROPONINIHS 29* 32* 39*  --   --   --   --   ? ? ?Estimated Creatinine Clearance: 114 mL/min (by C-G formula based on SCr of 1.04 mg/dL). ? ? ?Medications:  ?No anticoagulation prior to admission per my chart review. Patient has been receiving [place] heparin for DVT prophylaxis while hospitalized. ? ?Assessment: ?Patient is a 41 y/o M with medical history including tobacco use disorder, alcohol use disorder, CHF who was admitted 01/17/22 with cardiogenic shock. D-dimer elevated and persistent tachycardia. Subsequently found to have "acute pulmonary embolism in the multiple segmental and subsegmental branches in the right lung. There is small thrombus burden. There are no signs of acute right ventricular strain". Pharmacy consulted to initiate heparin infusion for acute PE. ? ?Baseline aPTT 39s (elevated) and PT-INR 1.4 (elevated). CBC notable for stable anemia ? ?Goal of Therapy:  ?Heparin level 0.3-0.7 units/ml ?Monitor platelets by anticoagulation protocol: Yes ?  ?Plan:  ?--Heparin level is therapeutic x 2 ?--Continue heparin infusion at 1600 units/hr ?--Recheck HL with AM labs tomorrow ?--Daily CBC per protocol while on IV heparin ?--No medication insurance on profile. Follow-up transition to oral  anticoagulation / options ? ?03/19/22  ?01/20/2022 ?10:41 AM ? ? ? ?

## 2022-01-20 NOTE — H&P (View-Only) (Signed)
? ?Progress Note ? ?Patient Name: Mario Beck ?Date of Encounter: 01/20/2022 ? ?[facility] Cardiologist: [facility]- Dr. Saunders Revel ? ?Subjective  ? ?Reports breathing is improving ?Still on milrinone infusion, heparin, ?Receiving IV magnesium ?Thoracentesis yesterday on left with improvement of symptoms ?Less pain and shortness of breath on taking a deep breath ?Hypotensive systolic pressures 90, tachycardic rate 110 120 ? ?Inpatient Medications  ?  ?Scheduled Meds: ? Chlorhexidine Gluconate Cloth  6 each Topical Daily  ? folic acid  1 mg Oral Daily  ? furosemide  40 mg Intravenous BID  ? lidocaine  1 patch Transdermal Q24H  ? multivitamin with minerals  1 tablet Oral Daily  ? nicotine  21 mg Transdermal Daily  ? potassium chloride  40 mEq Oral BID  ? sodium chloride flush  3 mL Intravenous Q12H  ? thiamine  100 mg Oral Daily  ? ?Continuous Infusions: ? sodium chloride 250 mL (01/20/22 0701)  ? cefTRIAXone (ROCEPHIN)  IV Stopped (01/19/22 1726)  ? heparin 1,600 Units/hr (01/20/22 0930)  ? milrinone 0.125 mcg/kg/min (01/20/22 0800)  ? ?PRN Meds: ?acetaminophen **OR** acetaminophen, hydrOXYzine, ondansetron (ZOFRAN) IV, oxyCODONE, polyethylene glycol  ? ?Vital Signs  ?  ?Vitals:  ? 01/20/22 0553 01/20/22 0600 01/20/22 0800 01/20/22 0806  ?BP:  102/89 104/68   ?Pulse: (!) 110 (!) 107 (!) 110 (!) 114  ?Resp: 20 20 18  (!) 22  ?Temp:      ?TempSrc:   Oral   ?SpO2: 97% 96% (!) 84% 96%  ?Weight:      ?Height:      ? ? ?Intake/Output Summary (Last 24 hours) at 01/20/2022 1310 ?Last data filed at 01/20/2022 [id number] ?Gross per 24 hour  ?Intake 793.33 ml  ?Output 6865 ml  ?Net -6071.67 ml  ? ? ?  01/20/2022  ?  5:00 AM 01/19/2022  ?  5:00 AM 01/18/2022  ?  5:00 AM  ?Last 3 Weights  ?Weight (lbs) 200 lb 2.8 oz 208 lb 15.9 oz 211 lb 3.2 oz  ?Weight (kg) 90.8 kg 94.8 kg 95.8 kg  ?   ? ?Telemetry  ?  ?Normal sinus rhythm/sinus tachycardia heart rate 110- Personally Reviewed ? ?ECG  ?  ? - Personally Reviewed ? ?Physical Exam  ? ?GEN: No acute  distress.   ?Neck: No JVD ?Cardiac: Regular, tachycardic, no murmurs, rubs, or gallops.  ?Respiratory: Clear to auscultation bilaterally. ?GI: Soft, nontender, non-distended  ?MS: No edema; No deformity. ?Neuro:  Nonfocal  ?Psych: Normal affect  ? ?Labs  ?  ?High Sensitivity Troponin:   ?Recent Labs  ?Lab 01/17/22 ?1932 01/17/22 ?2252 01/18/22 ?ID:[id number] 01/18/22 ?0755  ?TROPONINIHS 28* 29* 32* 39*  ?   ?Chemistry ?Recent Labs  ?Lab 01/17/22 ?0925 01/17/22 ?1506 01/17/22 ?1932 01/18/22 ?ID:[id number] 01/18/22 ?[id number] 01/19/22 ?[id number] 01/20/22 ?0321  ?NA 136  --  136 139  --  136 134*  ?K 6.1*  --  6.0* 4.4  --  3.8 3.5  ?CL 101  --  100 103  --  102 96*  ?CO2 23  --  18* 24  --  27 29  ?GLUCOSE 115*  --  124* 120*  --  144* 217*  ?BUN 30*  --  30* 33*  --  19 13  ?CREATININE 2.53*   < > 2.40* 2.30*  --  1.20 1.04  ?CALCIUM 8.6*  --  8.5* 8.5*  --  8.3* 8.5*  ?MG 2.3  --   --   --  2.0  --  1.8  ?PROT  --   --  7.3  --   --   --   --   ?ALBUMIN  --   --  3.3*  --   --   --   --   ?AST  --   --  94*  --   --   --   --   ?ALT  --   --  42  --   --   --   --   ?ALKPHOS  --   --  314*  --   --   --   --   ?BILITOT  --   --  1.8*  --   --   --   --   ?GFRNONAA 32*   < > 34* 36*  --  >60 >60  ?ANIONGAP 12  --  18* 12  --  7 9  ? < > = values in this interval not displayed.  ?  ?Lipids No results for input(s): CHOL, TRIG, HDL, LABVLDL, LDLCALC, CHOLHDL in the last 168 hours.  ?Hematology ?Recent Labs  ?Lab 01/17/22 ?0925 01/19/22 ?0449 01/20/22 ?0321  ?WBC 8.1 9.7 11.6*  ?RBC 3.34* 3.21* 3.40*  ?HGB 9.4* 8.9* 9.3*  ?HCT 28.7* 26.7* 28.4*  ?MCV 85.9 83.2 83.5  ?MCH 28.1 27.7 27.4  ?MCHC 32.8 33.3 32.7  ?RDW 18.5* 17.7* 18.1*  ?PLT 168 206 242  ? ?Thyroid No results for input(s): TSH, FREET4 in the last 168 hours.  ?BNP ?Recent Labs  ?Lab 01/17/22 ?0925  ?BNP 2,253.2*  ?  ?DDimer  ?Recent Labs  ?Lab 01/19/22 ?1542  ?DDIMER 9.39*  ?  ? ?Radiology  ?  ?CT Angio Chest Pulmonary Embolism (PE) W or WO Contrast ? ?Result Date: 01/19/2022 ?CLINICAL  DATA:  Pleuritic chest pain, shortness of breath EXAM: CT ANGIOGRAPHY CHEST WITH CONTRAST TECHNIQUE: Multidetector CT imaging of the chest was performed using the standard protocol during bolus administration of intravenous contrast. Multiplanar CT image reconstructions and MIPs were obtained to evaluate the vascular anatomy. RADIATION DOSE REDUCTION: This exam was performed according to the departmental dose-optimization program which includes automated exposure control, adjustment of the mA and/or kV according to patient size and/or use of iterative reconstruction technique. CONTRAST:  66mL OMNIPAQUE IOHEXOL 350 MG/ML SOLN COMPARISON:  01/17/2022 FINDINGS: Cardiovascular: There is marked enlargement of heart, especially the left ventricular cavity. There is homogeneous enhancement in thoracic aorta. There are no filling defects in the central pulmonary artery branches. There are intraluminal filling defects in the few segmental and subsegmental pulmonary artery branches in the right upper lobe right middle lobe and right lower lobe. There is small thrombus burden. There are no definite signs of acute right ventricular strain. Evaluation of peripheral pulmonary artery branches is limited by fairly extensive infiltrates in the mid and lower lung fields. Mediastinum/Nodes: There are slightly enlarged lymph nodes in the mediastinum, possibly suggesting reactive hyperplasia. Lungs/Pleura: Extensive infiltrates are seen in the posterior mid and lower lung fields as well as in the lateral segment of right middle lobe. Small to moderate bilateral pleural effusions are seen, more so on the right side. There is no pneumothorax. There few scattered blebs and bullae in the right middle lobe and both upper lobes. Upper Abdomen: There is fatty infiltration in the liver. Musculoskeletal: Unremarkable. Gynecomastia is seen in both breasts. Review of the MIP images confirms the above findings. IMPRESSION: Acute pulmonary embolism  in the multiple segmental and subsegmental branches in the right lung. There  is small thrombus burden. There are no signs of acute right ventricular strain. There is significant interval worsening of infiltrates in both lungs, especially in the posterior mid and lower lung fields. Findings suggest significant interval worsening of multifocal pneumonia. Part of the infiltrates in the right lower lung fields may be due to pulmonary infarction. Fatty liver. Marked cardiomegaly. There are scattered coronary artery calcifications. Imaging finding of acute pulmonary embolism was relayed to patient's provider Dr. Arbutus Ped by telephone call. Electronically Signed   By: Elmer Picker M.D.   On: 01/19/2022 18:55   ? ?Cardiac Studies  ? ?Echocardiogram January 18, 2022 ?Ejection fraction 20 to 25%, global hypokinesis ?Moderately reduced RV function ? ?Patient Profile  ?   ?41 y.o. 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 ?Assessment & Plan  ?  ?Cardiogenic shock ?Acute systolic heart failure ?-In the setting of medication noncompliance, alcohol abuse ?Presenting with bilateral pleural effusions BNP 2200 ?Initially treated with levophed and milrinone.  Levophed has since been weaned off ?, Remains on milrinone, IV Lasix twice daily ?-8.8 L total, Normal renal function ?-Would continue milrinone with IV Lasix for now ?Could consider catheterization tomorrow now that renal function has normalized ? ?AKI ?-Likely cardiorenal syndrome creatinine 2.5 on arrival ?Now down to normal range creatinine 1.0 ?  ?Alcohol abuse ?- h/o alcohol use for about 20 years ?- recently went through detox program and has not had alcohol since 3/21 ?-Significant concern for alcohol cardiomyopathy ?Does not show signs of alcohol withdrawal ?  ?Tobacco use ?Reports smoking 1 pack/day ?Cessation  recommended ?  ?Anemia ?- Hgb 9.3, baseline typically greater than 11 ?Possibly exacerbated by alcohol ? ?Pulmonary embolism ?Small on CT scan ?On heparin ? ? Total encounter time more than 60 minutes ? Greater t

## 2022-01-20 NOTE — Progress Notes (Addendum)
? ?Progress Note ? ?Patient Name: Mario Beck ?Date of Encounter: 01/20/2022 ? ?[facility] Cardiologist: [facility]- Dr. End ? ?Subjective  ? ?Reports breathing is improving ?Still on milrinone infusion, heparin, ?Receiving IV magnesium ?Thoracentesis yesterday on left with improvement of symptoms ?Less pain and shortness of breath on taking a deep breath ?Hypotensive systolic pressures 90, tachycardic rate 110 120 ? ?Inpatient Medications  ?  ?Scheduled Meds: ? Chlorhexidine Gluconate Cloth  6 each Topical Daily  ? folic acid  1 mg Oral Daily  ? furosemide  40 mg Intravenous BID  ? lidocaine  1 patch Transdermal Q24H  ? multivitamin with minerals  1 tablet Oral Daily  ? nicotine  21 mg Transdermal Daily  ? potassium chloride  40 mEq Oral BID  ? sodium chloride flush  3 mL Intravenous Q12H  ? thiamine  100 mg Oral Daily  ? ?Continuous Infusions: ? sodium chloride 250 mL (01/20/22 0701)  ? cefTRIAXone (ROCEPHIN)  IV Stopped (01/19/22 1726)  ? heparin 1,600 Units/hr (01/20/22 0930)  ? milrinone 0.125 mcg/kg/min (01/20/22 0800)  ? ?PRN Meds: ?acetaminophen **OR** acetaminophen, hydrOXYzine, ondansetron (ZOFRAN) IV, oxyCODONE, polyethylene glycol  ? ?Vital Signs  ?  ?Vitals:  ? 01/20/22 0553 01/20/22 0600 01/20/22 0800 01/20/22 0806  ?BP:  102/89 104/68   ?Pulse: (!) 110 (!) 107 (!) 110 (!) 114  ?Resp: 20 20 18 (!) 22  ?Temp:      ?TempSrc:   Oral   ?SpO2: 97% 96% (!) 84% 96%  ?Weight:      ?Height:      ? ? ?Intake/Output Summary (Last 24 hours) at 01/20/2022 1310 ?Last data filed at 01/20/2022 0952 ?Gross per 24 hour  ?Intake 793.33 ml  ?Output 6865 ml  ?Net -6071.67 ml  ? ? ?  01/20/2022  ?  5:00 AM 01/19/2022  ?  5:00 AM 01/18/2022  ?  5:00 AM  ?Last 3 Weights  ?Weight (lbs) 200 lb 2.8 oz 208 lb 15.9 oz 211 lb 3.2 oz  ?Weight (kg) 90.8 kg 94.8 kg 95.8 kg  ?   ? ?Telemetry  ?  ?Normal sinus rhythm/sinus tachycardia heart rate 110- Personally Reviewed ? ?ECG  ?  ? - Personally Reviewed ? ?Physical Exam  ? ?GEN: No acute  distress.   ?Neck: No JVD ?Cardiac: Regular, tachycardic, no murmurs, rubs, or gallops.  ?Respiratory: Clear to auscultation bilaterally. ?GI: Soft, nontender, non-distended  ?MS: No edema; No deformity. ?Neuro:  Nonfocal  ?Psych: Normal affect  ? ?Labs  ?  ?High Sensitivity Troponin:   ?Recent Labs  ?Lab 01/17/22 ?1932 01/17/22 ?2252 01/18/22 ?0525 01/18/22 ?0755  ?TROPONINIHS 28* 29* 32* 39*  ?   ?Chemistry ?Recent Labs  ?Lab 01/17/22 ?0925 01/17/22 ?1506 01/17/22 ?1932 01/18/22 ?0525 01/18/22 ?0755 01/19/22 ?0449 01/20/22 ?0321  ?NA 136  --  136 139  --  136 134*  ?K 6.1*  --  6.0* 4.4  --  3.8 3.5  ?CL 101  --  100 103  --  102 96*  ?CO2 23  --  18* 24  --  27 29  ?GLUCOSE 115*  --  124* 120*  --  144* 217*  ?BUN 30*  --  30* 33*  --  19 13  ?CREATININE 2.53*   < > 2.40* 2.30*  --  1.20 1.04  ?CALCIUM 8.6*  --  8.5* 8.5*  --  8.3* 8.5*  ?MG 2.3  --   --   --  2.0  --    1.8  ?PROT  --   --  7.3  --   --   --   --   ?ALBUMIN  --   --  3.3*  --   --   --   --   ?AST  --   --  94*  --   --   --   --   ?ALT  --   --  42  --   --   --   --   ?ALKPHOS  --   --  314*  --   --   --   --   ?BILITOT  --   --  1.8*  --   --   --   --   ?GFRNONAA 32*   < > 34* 36*  --  >60 >60  ?ANIONGAP 12  --  18* 12  --  7 9  ? < > = values in this interval not displayed.  ?  ?Lipids No results for input(s): CHOL, TRIG, HDL, LABVLDL, LDLCALC, CHOLHDL in the last 168 hours.  ?Hematology ?Recent Labs  ?Lab 01/17/22 ?0925 01/19/22 ?0449 01/20/22 ?0321  ?WBC 8.1 9.7 11.6*  ?RBC 3.34* 3.21* 3.40*  ?HGB 9.4* 8.9* 9.3*  ?HCT 28.7* 26.7* 28.4*  ?MCV 85.9 83.2 83.5  ?MCH 28.1 27.7 27.4  ?MCHC 32.8 33.3 32.7  ?RDW 18.5* 17.7* 18.1*  ?PLT 168 206 242  ? ?Thyroid No results for input(s): TSH, FREET4 in the last 168 hours.  ?BNP ?Recent Labs  ?Lab 01/17/22 ?0925  ?BNP 2,253.2*  ?  ?DDimer  ?Recent Labs  ?Lab 01/19/22 ?1542  ?DDIMER 9.39*  ?  ? ?Radiology  ?  ?CT Angio Chest Pulmonary Embolism (PE) W or WO Contrast ? ?Result Date: 01/19/2022 ?CLINICAL  DATA:  Pleuritic chest pain, shortness of breath EXAM: CT ANGIOGRAPHY CHEST WITH CONTRAST TECHNIQUE: Multidetector CT imaging of the chest was performed using the standard protocol during bolus administration of intravenous contrast. Multiplanar CT image reconstructions and MIPs were obtained to evaluate the vascular anatomy. RADIATION DOSE REDUCTION: This exam was performed according to the departmental dose-optimization program which includes automated exposure control, adjustment of the mA and/or kV according to patient size and/or use of iterative reconstruction technique. CONTRAST:  80mL OMNIPAQUE IOHEXOL 350 MG/ML SOLN COMPARISON:  01/17/2022 FINDINGS: Cardiovascular: There is marked enlargement of heart, especially the left ventricular cavity. There is homogeneous enhancement in thoracic aorta. There are no filling defects in the central pulmonary artery branches. There are intraluminal filling defects in the few segmental and subsegmental pulmonary artery branches in the right upper lobe right middle lobe and right lower lobe. There is small thrombus burden. There are no definite signs of acute right ventricular strain. Evaluation of peripheral pulmonary artery branches is limited by fairly extensive infiltrates in the mid and lower lung fields. Mediastinum/Nodes: There are slightly enlarged lymph nodes in the mediastinum, possibly suggesting reactive hyperplasia. Lungs/Pleura: Extensive infiltrates are seen in the posterior mid and lower lung fields as well as in the lateral segment of right middle lobe. Small to moderate bilateral pleural effusions are seen, more so on the right side. There is no pneumothorax. There few scattered blebs and bullae in the right middle lobe and both upper lobes. Upper Abdomen: There is fatty infiltration in the liver. Musculoskeletal: Unremarkable. Gynecomastia is seen in both breasts. Review of the MIP images confirms the above findings. IMPRESSION: Acute pulmonary embolism  in the multiple segmental and subsegmental branches in the right lung. There   is small thrombus burden. There are no signs of acute right ventricular strain. There is significant interval worsening of infiltrates in both lungs, especially in the posterior mid and lower lung fields. Findings suggest significant interval worsening of multifocal pneumonia. Part of the infiltrates in the right lower lung fields may be due to pulmonary infarction. Fatty liver. Marked cardiomegaly. There are scattered coronary artery calcifications. Imaging finding of acute pulmonary embolism was relayed to patient's provider Dr. Griffith by telephone call. Electronically Signed   By: Palani  Rathinasamy M.D.   On: 01/19/2022 18:55   ? ?Cardiac Studies  ? ?Echocardiogram January 18, 2022 ?Ejection fraction 20 to 25%, global hypokinesis ?Moderately reduced RV function ? ?Patient Profile  ?   ?41 y.o. 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 ?Assessment & Plan  ?  ?Cardiogenic shock ?Acute systolic heart failure ?-In the setting of medication noncompliance, alcohol abuse ?Presenting with bilateral pleural effusions BNP 2200 ?Initially treated with levophed and milrinone.  Levophed has since been weaned off ?, Remains on milrinone, IV Lasix twice daily ?-8.8 L total, Normal renal function ?-Would continue milrinone with IV Lasix for now ?Could consider catheterization tomorrow now that renal function has normalized ? ?AKI ?-Likely cardiorenal syndrome creatinine 2.5 on arrival ?Now down to normal range creatinine 1.0 ?  ?Alcohol abuse ?- h/o alcohol use for about 20 years ?- recently went through detox program and has not had alcohol since 3/21 ?-Significant concern for alcohol cardiomyopathy ?Does not show signs of alcohol withdrawal ?  ?Tobacco use ?Reports smoking 1 pack/day ?Cessation  recommended ?  ?Anemia ?- Hgb 9.3, baseline typically greater than 11 ?Possibly exacerbated by alcohol ? ?Pulmonary embolism ?Small on CT scan ?On heparin ? ? Total encounter time more than 60 minutes ? Greater t

## 2022-01-20 NOTE — Progress Notes (Signed)
?Progress Note ? ? ?Patient: Mario Beck [id number] DOB: [birth year] DOA: 01/17/2022     3 ?DOS: the patient was seen and examined on 01/20/2022 ?  ?Brief hospital course: ?HPI taken from ICU notes: ?"41 yo male with a history of cardiomyopathy presented to the [facility] ER on 4/3 complaining of 3-4 days of dyspnea, fatigue and weight gain. He says that he has been compliant with his medications which include furosemide, metoprolol, nexium, potassium and sertraline.  However he says that the lasix has not been working for him in the last week or so and his urine output has become very minimal and dark.  He has noted increasing dyspnea when he climbs a flight of stairs in his apartment and yesterday passed out twice when he was walking across the parking lot.   ?He was admitted to [facility] for alcohol detox on 3/21, treated with lithium, and discharged on 3/24.  He says that he has been compliant with his medications. ?He was supposed to see Dr. Humphrey Rolls with cardiology after his last discharge for alcoholic pancreatitis, acute decompensated CHF in 06/2021, but he missed his appointments and has not followed up.  ?He smokes 1 pack of cigarettes daily.  He quit drinking on 3/21 and has not had alcohol since then.  He denies illicit drug use." ? ?Patient was admitted to ICU with cardiogenic shock. ?Cardiology consulted.   ?Plan for L/R heart cath once euvolemic. ?Currently on milrinone and IV Lasix.   ?Weaned off Levophed.  ? ?Transferred to [facility] service 4/5. ?Found to have acute PE on the right with probable pulmonary infarct.  Started on heparin drip. ? ?Assessment and Plan: ?* Acute on chronic HFrEF (heart failure with reduced ejection fraction) ([place]) ?Admitted to ICU with cardiogenic shock 4/3. ?Presented with worsening dyspnea and orthopnea, weight gain, elevated BNP 2253, and bilateral pleural effusions. ?Most recent echo with EF less than 20% with moderate to severe MR. ?Likely nonischemic cardiomyopathy, but  needs ischemic evaluation. ?-- Management per cardiology ?-- On milrinone, IV Lasix 40 mg twice daily ?-- Weaned off Levophed ?-- Strict I/O's, Daily weights ?-- Plan for L/R heart cath once euvolemic ? ?Net IO Since Admission: -8,705.21 mL [01/20/22 2033] ? ? ?Cardiogenic shock ([place]) ?As outlined above ? ?Pleuritic chest pain ?Seems musculoskeletal.  Recovering from AKI, hope to avoid CTA chest.  Low suspicion for PE at this time given patient on room air. ?-- Check D-dimer ?-- Consider lower extremity Dopplers ?-- Consider CTA chest once renal function further improved ?-- Supportive care with analgesia as needed ?-- Incentive spirometer ? ?Pleural effusion on left ?Status post bedside thoracentesis on 4/4 with 850 cc removed. ?-- Follow-up pleural fluid culture and cytology ? ?AKI (acute kidney injury) ([place]) ?POA, resolved.  Likely prerenal etiology given increased home dose Lasix in days prior to admission. ?--Monitor BMP ? ?Hyperkalemia ?Resolved.  POA with potassium 6.1 in the setting of worsening renal function and potassium supplementation at home with his diuretic.  No EKG changes.   ?K 3.5 ?-- Monitor BMP ? ?Syncope ?On admission, patient reported feeling dizzy and someone was able to hold him before falling down.  No chest pain.  Recently increased Lasix.  Patient has very low EF. ?Likely due to poor cardiac output and hypotension. ?-- Monitor orthostatic vitals ?-- Further evaluation pending clinical course ? ?Lactic acidosis ?POA, resolved.   ?Most likely secondary to decreased perfusio in the setting of cardiogenic shock n.  No leukocytosis or fever.  CT  chest was concerning for multifocal pneumonia, started on empiric antibiotics on admission. ? ? ?HTN (hypertension), malignant ?Admitted with cardiogenic shock, required Levophed. ?Home regimen metoprolol 100 mg daily, isosorbide, hydralazine 25 mg 3 times a day and Lasix 40 mg twice daily at home. ?--Currently on milrinone ?--Hold  antihypertensives ? ? ?Alcohol use disorder, moderate, dependence ([place]) ?Last drink 8 days prior to admission.  He was admitted in [facility] for alcohol detox on 3/21, treated with Librium, discharged 3/24.  Denies any alcohol consumption since.  No signs of withdrawal. ?--Stop CIWA ?-- Monitor ? ?Tobacco abuse counseling ?Counseled regarding importance of cessation.  Nicotine patch ordered. ? ?Acute pulmonary embolism ([place]) ?Pt had severe persistent R pleuritic chest pain, tachycardia.  D-dimer >9.  CTA on 4/5 revealed "Acute pulmonary embolism in the multiple segmental and subsegmental branches in the right lung. There is small thrombus burden. There are no signs of acute right ventricular strain..." possible pulmonary infarction. ?--On heparin drip ?--Will transition to Eliquis ? ?CAP (community acquired pneumonia) ?Continue Rocephin and Zithromax. ?Supportive care. ? ? ? ? ?  ? ?Subjective: Pt awake sitting up in bed, s/o at bedside this AM.  He reports improvement in the right-sided chest pain he was having, little better today than yesterday when it was refractory to pain meds.  We discussed his situation in detail, and importance of alcohol cessation for his heart function.  He expresses understanding.  Reports feeling well after recent alcohol detox and being sober. Indicates he does not plan to start drinking again. ? ?Physical Exam: ?Vitals:  ? 01/20/22 1200 01/20/22 1400 01/20/22 1600 01/20/22 1800  ?BP: 109/69 113/66 105/70 110/74  ?Pulse: (!) 122 (!) 116 (!) 113 (!) 116  ?Resp: 20 (!) 24 18 (!) 24  ?Temp: 98.3 ?F (36.8 ?C)  98.4 ?F (36.9 ?C)   ?TempSrc: Oral  Oral   ?SpO2: 97% 94% 98% 97%  ?Weight:      ?Height:      ? ?General exam: awake, alert, no acute distress ?HEENT: moist mucus membranes, hearing grossly normal  ?Respiratory system: bilateral crackles, no wheezes, rales or rhonchi, normal respiratory effort, on 3 L/min O2. ?Cardiovascular system: normal S1/S2, RRR, no pedal edema.   ?Central  nervous system: A&O x4. no gross focal neurologic deficits, normal speech ?Extremities: moves all , no edema, normal tone ?Skin: dry, intact, normal temperature ?Psychiatry: normal mood, congruent affect, judgement and insight appear normal ? ? ?Data Reviewed: ? ?Labs reviewed and notable for Na 134, Cl 96, glucose 217, Cr normal 1.04, Ca 8.5, WBC 11.6, Hbg 9.3 ? ?Family Communication: girlfriend at bedside on rounds this AM. ? ?Disposition: ?Status is: Inpatient ?Remains inpatient appropriate because: remains on milrinone and heparin infusions in ICU. ? ? ? Planned Discharge Destination: Home ? ? ? ?Time spent: 40 minutes ? ?Author: ?Ezekiel Slocumb, DO ?01/20/2022 8:35 PM ? ?For on call review [email or web address].  ?

## 2022-01-20 NOTE — Plan of Care (Signed)
  Problem: Education: Goal: Ability to demonstrate management of disease process will improve Outcome: Not Progressing Goal: Ability to verbalize understanding of medication therapies will improve Outcome: Not Progressing Goal: Individualized Educational Video(s) Outcome: Not Progressing   Problem: Activity: Goal: Capacity to carry out activities will improve Outcome: Not Progressing   Problem: Cardiac: Goal: Ability to achieve and maintain adequate cardiopulmonary perfusion will improve Outcome: Not Progressing   Problem: Education: Goal: Knowledge of General Education information will improve Description: Including pain rating scale, medication(s)/side effects and non-pharmacologic comfort measures Outcome: Not Progressing   Problem: Health Behavior/Discharge Planning: Goal: Ability to manage health-related needs will improve Outcome: Not Progressing   Problem: Clinical Measurements: Goal: Ability to maintain clinical measurements within normal limits will improve Outcome: Not Progressing Goal: Will remain free from infection Outcome: Not Progressing Goal: Diagnostic test results will improve Outcome: Not Progressing Goal: Respiratory complications will improve Outcome: Not Progressing Goal: Cardiovascular complication will be avoided Outcome: Not Progressing   Problem: Activity: Goal: Risk for activity intolerance will decrease Outcome: Not Progressing   Problem: Nutrition: Goal: Adequate nutrition will be maintained Outcome: Not Progressing   Problem: Coping: Goal: Level of anxiety will decrease Outcome: Not Progressing   Problem: Elimination: Goal: Will not experience complications related to bowel motility Outcome: Not Progressing Goal: Will not experience complications related to urinary retention Outcome: Not Progressing   Problem: Pain Managment: Goal: General experience of comfort will improve Outcome: Not Progressing   Problem: Safety: Goal:  Ability to remain free from injury will improve Outcome: Not Progressing   Problem: Skin Integrity: Goal: Risk for impaired skin integrity will decrease Outcome: Not Progressing   

## 2022-01-20 NOTE — Assessment & Plan Note (Addendum)
Pt had severe persistent R pleuritic chest pain, tachycardia.  D-dimer >9.  CTA on 4/5 revealed "Acute pulmonary embolism in the multiple segmental and subsegmental branches in the right lung. There is small thrombus burden. There are no signs of acute right ventricular strain..." possible pulmonary infarction. ?-- Transitioned off heparin drip to Eliquis after cath 4/7 ?-- Continue Eliquis ?

## 2022-01-20 NOTE — Consult Note (Signed)
ANTICOAGULATION CONSULT NOTE ? ?Pharmacy Consult for IV Heparin ?Indication: pulmonary embolus ? ?Patient Measurements: ?Height: 6\' 3"  (190.5 cm) ?Weight: 94.8 kg (208 lb 15.9 oz) ?IBW/kg (Calculated) : 84.5 ?Heparin Dosing Weight: 94.8 kg ? ?Labs: ?Recent Labs  ?  01/17/22 ?0925 01/17/22 ?1506 01/17/22 ?2252 01/18/22 ?03/20/22 01/18/22 ?03/20/22 01/19/22 ?0449 01/19/22 ?1941 01/20/22 ?0321  ?HGB 9.4*  --   --   --   --  8.9*  --  9.3*  ?HCT 28.7*  --   --   --   --  26.7*  --  28.4*  ?PLT 168  --   --   --   --  206  --  242  ?APTT  --   --   --   --   --   --  39*  --   ?LABPROT  --   --   --   --   --   --  16.9*  --   ?INR  --   --   --   --   --   --  1.4*  --   ?HEPARINUNFRC  --   --   --   --   --   --   --  0.50  ?CREATININE 2.53*   < >  --  2.30*  --  1.20  --  1.04  ?TROPONINIHS  --    < > 29* 32* 39*  --   --   --   ? < > = values in this interval not displayed.  ? ? ? ?Estimated Creatinine Clearance: 114 mL/min (by C-G formula based on SCr of 1.04 mg/dL). ? ? ?Medications:  ?No anticoagulation prior to admission per my chart review. Patient has been receiving [place] heparin for DVT prophylaxis while hospitalized. ? ?Assessment: ?Patient is a 41 y/o M with medical history including tobacco use disorder, alcohol use disorder, CHF who was admitted 01/17/22 with cardiogenic shock. D-dimer elevated and persistent tachycardia. Subsequently found to have "acute pulmonary embolism in the multiple segmental and subsegmental branches in the right lung. There is small thrombus burden. There are no signs of acute right ventricular strain". Pharmacy consulted to initiate heparin infusion for acute PE. ? ?Baseline aPTT and PT-INR are pending. Baseline CBC notable for anemia (Hgb 9.4 >> 8.9).  ? ?Goal of Therapy:  ?Heparin level 0.3-0.7 units/ml ?Monitor platelets by anticoagulation protocol: Yes ? ?4/06 0321 HL 0.5, therapeutic x 1 ?  ?Plan:  ?--Continue heparin infusion at 1600 units/hr ?--Recheck HL in 6 hr to confirm, then  daily ?--Daily CBC per protocol while on IV heparin ? ?6/06, PharmD, MBA ?01/20/2022 ?3:53 AM ? ? ? ?

## 2022-01-20 NOTE — Progress Notes (Signed)
PHARMACY CONSULT NOTE ? ?Pharmacy Consult for Electrolyte Monitoring and Replacement  ? ?Recent Labs: ?Potassium (mmol/L)  ?Date Value  ?01/20/2022 3.5  ? ?Magnesium (mg/dL)  ?Date Value  ?01/20/2022 1.8  ? ?Calcium (mg/dL)  ?Date Value  ?01/20/2022 8.5 (L)  ? ?Albumin (g/dL)  ?Date Value  ?01/17/2022 3.3 (L)  ? ?Phosphorus (mg/dL)  ?Date Value  ?01/20/2022 3.6  ? ?Sodium (mmol/L)  ?Date Value  ?01/20/2022 134 (L)  ? ?Assessment: ?76 YOM presenting with dyspnea, fatigue, and weight gain. PMH CHF EF 30%, alcohol dependence, multiple episodes alcoholic pancreatitis, cigarette smoking, GERD, depression. In ICU with cardiogenic shock due to acute decompensated systolic heart failure.  ? ?Medications:  ?Milrinone 0.125 mcg/kg/min (weaning) ?IV Lasix 40 mg BID ? ?Goal of Therapy:  ?K >= 4 ?Mg >= 2 ?All other electrolytes within normal limits ? ?Plan:  ?K 3.5, Kcl 40 mEq BID x 2 doses to assess response; patient will likely need scheduled potassium regimen / modify as needed based on changes to diuresis regimen ?Mg 1.8, IV magnesium sulfate 2 g x 1 ?Follow-up electrolytes with AM labs tomorrow ? ?Tressie Ellis ?01/20/2022 ?8:01 AM ? ?

## 2022-01-21 ENCOUNTER — Encounter
Admission: EM | Disposition: A | Discharge status: Other Acute Inpatient Hospital | Payer: Self-pay | Source: Home / Self Care | Attending: Internal Medicine

## 2022-01-21 DIAGNOSIS — I428 Other cardiomyopathies: Secondary | ICD-10-CM

## 2022-01-21 DIAGNOSIS — I2510 Atherosclerotic heart disease of native coronary artery without angina pectoris: Secondary | ICD-10-CM

## 2022-01-21 DIAGNOSIS — I2699 Other pulmonary embolism without acute cor pulmonale: Secondary | ICD-10-CM

## 2022-01-21 HISTORY — PX: RIGHT/LEFT HEART CATH AND CORONARY ANGIOGRAPHY: CATH118266

## 2022-01-21 LAB — BODY FLUID CULTURE W GRAM STAIN
Culture: NO GROWTH
Gram Stain: NONE SEEN

## 2022-01-21 LAB — BASIC METABOLIC PANEL
Anion gap: 11 (ref 5–15)
BUN: 13 mg/dL (ref 6–20)
CO2: 28 mmol/L (ref 22–32)
Calcium: 9.1 mg/dL (ref 8.9–10.3)
Chloride: 96 mmol/L — ABNORMAL LOW (ref 98–111)
Creatinine, Ser: 0.94 mg/dL (ref 0.61–1.24)
GFR, Estimated: >60 mL/min (ref >60)
Glucose, Bld: 174 mg/dL — ABNORMAL HIGH (ref 70–99)
Potassium: 4.4 mmol/L (ref 3.5–5.1)
Sodium: 135 mmol/L (ref 135–145)

## 2022-01-21 LAB — CBC
HCT: 29.3 % — ABNORMAL LOW (ref 39.0–52.0)
Hemoglobin: 9.7 g/dL — ABNORMAL LOW (ref 13.0–17.0)
MCH: 27.3 pg (ref 26.0–34.0)
MCHC: 33.1 g/dL (ref 30.0–36.0)
MCV: 82.5 fL (ref 80.0–100.0)
Platelets: 289 10*3/uL (ref 150–400)
RBC: 3.55 MIL/uL — ABNORMAL LOW (ref 4.22–5.81)
RDW: 17.9 % — ABNORMAL HIGH (ref 11.5–15.5)
WBC: 13.1 10*3/uL — ABNORMAL HIGH (ref 4.0–10.5)
nRBC: 3.2 % — ABNORMAL HIGH (ref 0.0–0.2)

## 2022-01-21 LAB — HEPARIN LEVEL (UNFRACTIONATED): Heparin Unfractionated: 0.24 IU/mL — ABNORMAL LOW (ref 0.30–0.70)

## 2022-01-21 LAB — MAGNESIUM: Magnesium: 2.1 mg/dL (ref 1.7–2.4)

## 2022-01-21 SURGERY — RIGHT/LEFT HEART CATH AND CORONARY ANGIOGRAPHY
Anesthesia: Moderate Sedation

## 2022-01-21 MED ORDER — SODIUM CHLORIDE 0.9% FLUSH: 3.0000 mL | INTRAVENOUS | Status: DC | PRN

## 2022-01-21 MED ORDER — SPIRONOLACTONE 25 MG PO TABS
12.5000 mg | ORAL_TABLET | Freq: Every day | ORAL | Status: DC
  Administered 2022-01-21 – 2022-01-22 (×2): 12.5 mg via ORAL
  Filled 2022-01-21: qty 1
  Filled 2022-01-21 (×2): qty 0.5
  Filled 2022-01-21: qty 1

## 2022-01-21 MED ORDER — SODIUM CHLORIDE 0.9% FLUSH
3.0000 mL | Freq: Two times a day (BID) | INTRAVENOUS | Status: DC
  Administered 2022-01-21 – 2022-01-22 (×3): 3 mL via INTRAVENOUS

## 2022-01-21 MED ORDER — FUROSEMIDE 40 MG PO TABS
40.0000 mg | ORAL_TABLET | Freq: Two times a day (BID) | ORAL | Status: DC
Start: 2022-01-21 — End: 2022-01-23
  Administered 2022-01-21 – 2022-01-23 (×4): 40 mg via ORAL
  Filled 2022-01-21 (×4): qty 1

## 2022-01-21 MED ORDER — SODIUM CHLORIDE 0.9% FLUSH: 3.0000 mL | Freq: Two times a day (BID) | INTRAVENOUS | Status: DC

## 2022-01-21 MED ORDER — HEPARIN BOLUS VIA INFUSION
1400.0000 [IU] | Freq: Once | INTRAVENOUS | Status: AC
  Administered 2022-01-21: 1400 [IU] via INTRAVENOUS
  Filled 2022-01-21: qty 1400

## 2022-01-21 MED ORDER — LOSARTAN POTASSIUM 25 MG PO TABS
12.5000 mg | ORAL_TABLET | Freq: Every day | ORAL | Status: DC
Start: 2022-01-21 — End: 2022-01-28
  Administered 2022-01-21 – 2022-01-28 (×8): 12.5 mg via ORAL
  Filled 2022-01-21: qty 0.5
  Filled 2022-01-21 (×2): qty 1
  Filled 2022-01-21: qty 0.5
  Filled 2022-01-21: qty 1
  Filled 2022-01-21: qty 0.5
  Filled 2022-01-21 (×2): qty 1

## 2022-01-21 MED ORDER — SODIUM CHLORIDE 0.9 % IV SOLN
2.0000 g | Freq: Three times a day (TID) | INTRAVENOUS | Status: DC
  Administered 2022-01-21 – 2022-01-25 (×12): 2 g via INTRAVENOUS
  Filled 2022-01-21: qty 2
  Filled 2022-01-21 (×2): qty 12.5
  Filled 2022-01-21: qty 2
  Filled 2022-01-21: qty 12.5
  Filled 2022-01-21: qty 2
  Filled 2022-01-21: qty 12.5
  Filled 2022-01-21: qty 2
  Filled 2022-01-21 (×5): qty 12.5
  Filled 2022-01-21: qty 2
  Filled 2022-01-21: qty 12.5

## 2022-01-21 MED ORDER — IOHEXOL 300 MG/ML  SOLN
INTRAMUSCULAR | Status: DC | PRN
  Administered 2022-01-21: 43 mL

## 2022-01-21 MED ORDER — APIXABAN 5 MG PO TABS: 5.0000 mg | ORAL_TABLET | Freq: Two times a day (BID) | ORAL | Status: DC

## 2022-01-21 MED ORDER — APIXABAN 5 MG PO TABS
5.0000 mg | ORAL_TABLET | Freq: Two times a day (BID) | ORAL | Status: DC
Start: 2022-01-28 — End: 2022-01-21

## 2022-01-21 MED ORDER — APIXABAN 5 MG PO TABS: 10.0000 mg | ORAL_TABLET | Freq: Two times a day (BID) | ORAL | Status: DC

## 2022-01-21 MED ORDER — HEPARIN (PORCINE) IN NACL 1000-0.9 UT/500ML-% IV SOLN
INTRAVENOUS | Status: AC
  Filled 2022-01-21: qty 1000

## 2022-01-21 MED ORDER — FENTANYL CITRATE (PF) 100 MCG/2ML IJ SOLN
INTRAMUSCULAR | Status: AC
Start: 2022-01-21 — End: ?
  Filled 2022-01-21: qty 2

## 2022-01-21 MED ORDER — HEPARIN (PORCINE) IN NACL 1000-0.9 UT/500ML-% IV SOLN
INTRAVENOUS | Status: DC | PRN
Start: 2022-01-21 — End: 2022-01-21
  Administered 2022-01-21 (×2): 500 mL

## 2022-01-21 MED ORDER — DIGOXIN 125 MCG PO TABS
0.1250 mg | ORAL_TABLET | Freq: Every day | ORAL | Status: DC
Start: 2022-01-22 — End: 2022-01-28
  Administered 2022-01-22 – 2022-01-28 (×7): 0.125 mg via ORAL
  Filled 2022-01-21 (×7): qty 1

## 2022-01-21 MED ORDER — SODIUM CHLORIDE 0.9 % IV SOLN: INTRAVENOUS | Status: DC

## 2022-01-21 MED ORDER — HEPARIN SODIUM (PORCINE) 1000 UNIT/ML IJ SOLN
INTRAMUSCULAR | Status: AC
Start: 2022-01-21 — End: ?
  Filled 2022-01-21: qty 10

## 2022-01-21 MED ORDER — ASPIRIN 81 MG PO CHEW: 81.0000 mg | CHEWABLE_TABLET | ORAL | Status: DC

## 2022-01-21 MED ORDER — HEPARIN SODIUM (PORCINE) 1000 UNIT/ML IJ SOLN
INTRAMUSCULAR | Status: DC | PRN
  Administered 2022-01-21: 4500 [IU] via INTRAVENOUS

## 2022-01-21 MED ORDER — LIDOCAINE HCL (PF) 1 % IJ SOLN
INTRAMUSCULAR | Status: DC | PRN
  Administered 2022-01-21 (×2): 2 mL

## 2022-01-21 MED ORDER — SODIUM CHLORIDE 0.9 % IV SOLN: 250.0000 mL | INTRAVENOUS | Status: DC | PRN

## 2022-01-21 MED ORDER — DIGOXIN 250 MCG PO TABS
0.2500 mg | ORAL_TABLET | Freq: Four times a day (QID) | ORAL | Status: AC
  Administered 2022-01-21 (×2): 0.25 mg via ORAL
  Filled 2022-01-21 (×2): qty 1

## 2022-01-21 MED ORDER — LIDOCAINE HCL 1 % IJ SOLN
INTRAMUSCULAR | Status: AC
  Filled 2022-01-21: qty 20

## 2022-01-21 MED ORDER — MIDAZOLAM HCL 2 MG/2ML IJ SOLN
INTRAMUSCULAR | Status: AC
  Filled 2022-01-21: qty 2

## 2022-01-21 MED ORDER — VERAPAMIL HCL 2.5 MG/ML IV SOLN
INTRAVENOUS | Status: AC
Start: 2022-01-21 — End: ?
  Filled 2022-01-21: qty 2

## 2022-01-21 MED ORDER — DEXTROSE 50 % IV SOLN: 25.0000 g | Freq: Once | INTRAVENOUS | Status: DC

## 2022-01-21 MED ORDER — DEXTROSE 50 % IV SOLN
INTRAVENOUS | Status: AC
  Filled 2022-01-21: qty 50

## 2022-01-21 MED ORDER — APIXABAN 5 MG PO TABS
10.0000 mg | ORAL_TABLET | Freq: Two times a day (BID) | ORAL | Status: DC
Start: 2022-01-21 — End: 2022-01-26
  Administered 2022-01-21 – 2022-01-26 (×10): 10 mg via ORAL
  Filled 2022-01-21 (×10): qty 2

## 2022-01-21 MED ORDER — VERAPAMIL HCL 2.5 MG/ML IV SOLN
INTRAVENOUS | Status: DC | PRN
  Administered 2022-01-21: 2.5 mg via INTRA_ARTERIAL

## 2022-01-21 SURGICAL SUPPLY — 12 items
CATH BALLN WEDGE 5F 110CM (CATHETERS) ×1 IMPLANT
CATH INFINITI 5FR JK (CATHETERS) ×1 IMPLANT
DEVICE RAD TR BAND REGULAR (VASCULAR PRODUCTS) ×1 IMPLANT
DRAPE BRACHIAL (DRAPES) ×2 IMPLANT
GLIDESHEATH SLEND SS 6F .021 (SHEATH) ×1 IMPLANT
GUIDEWIRE INQWIRE 1.5J.035X260 (WIRE) IMPLANT
INQWIRE 1.5J .035X260CM (WIRE) ×2 IMPLANT
PACK CARDIAC CATH (CUSTOM PROCEDURE TRAY) ×2 IMPLANT
PROTECTION STATION PRESSURIZED (MISCELLANEOUS) ×2 IMPLANT
SET ATX SIMPLICITY (MISCELLANEOUS) ×1 IMPLANT
SHEATH GLIDE SLENDER 4/5FR (SHEATH) ×1 IMPLANT
STATION PROTECTION PRESSURIZED (MISCELLANEOUS) IMPLANT

## 2022-01-21 NOTE — Progress Notes (Signed)
PHARMACY CONSULT NOTE ? ?Pharmacy Consult for Electrolyte Monitoring and Replacement  ? ?Recent Labs: ?Potassium (mmol/L)  ?Date Value  ?01/21/2022 4.4  ? ?Magnesium (mg/dL)  ?Date Value  ?01/21/2022 2.1  ? ?Calcium (mg/dL)  ?Date Value  ?01/21/2022 9.1  ? ?Albumin (g/dL)  ?Date Value  ?01/17/2022 3.3 (L)  ? ?Phosphorus (mg/dL)  ?Date Value  ?01/20/2022 3.6  ? ?Sodium (mmol/L)  ?Date Value  ?01/21/2022 135  ? ?Assessment: ?58 YOM presenting with dyspnea, fatigue, and weight gain. PMH CHF EF 30%, alcohol dependence, multiple episodes alcoholic pancreatitis, cigarette smoking, GERD, depression. In ICU with cardiogenic shock due to acute decompensated systolic heart failure.  ? ?Medications:  ?Milrinone 0.125 mcg/kg/min (weaning) ?IV Lasix 40 mg BID ? ?Goal of Therapy:  ?K >= 4 ?Mg >= 2 ?All other electrolytes within normal limits ? ?Plan:  ?No electrolyte replacement indicated at this time ?Follow-up electrolytes with AM labs tomorrow ? ?Tressie Ellis ?01/21/2022 ?7:37 AM ? ?

## 2022-01-21 NOTE — Interval H&P Note (Signed)
History and Physical Interval Note: ? ?01/21/2022 ?1:43 PM ? ?Mario Beck  has presented today for surgery, with the diagnosis of acute systolic congestive heart failure.  The various methods of treatment have been discussed with the patient and family. After consideration of risks, benefits and other options for treatment, the patient has consented to  Procedure(s): ?RIGHT/LEFT HEART CATH AND CORONARY ANGIOGRAPHY (N/A) as a surgical intervention.  The patient's history has been reviewed, patient examined, no change in status, stable for surgery.  I have reviewed the patient's chart and labs.  Questions were answered to the patient's satisfaction.   ? ? ?[name] ? ? ?

## 2022-01-21 NOTE — Progress Notes (Signed)
?Progress Note ? ? ?Patient: Mario Beck CVE:[id number] DOB: 04-25-1981 DOA: 01/17/2022     4 ?DOS: the patient was seen and examined on 01/21/2022 ?  ?Brief hospital course: ?HPI taken from ICU notes: ?"41 yo male with a history of cardiomyopathy presented to the [facility] ER on 4/3 complaining of 3-4 days of dyspnea, fatigue and weight gain. He says that he has been compliant with his medications which include furosemide, metoprolol, nexium, potassium and sertraline.  However he says that the lasix has not been working for him in the last week or so and his urine output has become very minimal and dark.  He has noted increasing dyspnea when he climbs a flight of stairs in his apartment and yesterday passed out twice when he was walking across the parking lot.   ?He was admitted to [facility] for alcohol detox on 3/21, treated with lithium, and discharged on 3/24.  He says that he has been compliant with his medications. ?He was supposed to see Dr. Welton Flakes with cardiology after his last discharge for alcoholic pancreatitis, acute decompensated CHF in 06/2021, but he missed his appointments and has not followed up.  ?He smokes 1 pack of cigarettes daily.  He quit drinking on 3/21 and has not had alcohol since then.  He denies illicit drug use." ? ?Patient was admitted to ICU with cardiogenic shock. ?Cardiology consulted.   ?Plan for L/R heart cath once euvolemic. ?Currently on milrinone and IV Lasix.   ?Weaned off Levophed.  ? ?Transferred to [facility] service 4/5. ?Found to have acute PE on the right with probable pulmonary infarct.  Started on heparin drip. ? ?Assessment and Plan: ?* Acute on chronic HFrEF (heart failure with reduced ejection fraction) (HCC) ?Admitted to ICU with cardiogenic shock 4/3. ?Presented with worsening dyspnea and orthopnea, weight gain, elevated BNP 2253, and bilateral pleural effusions. ?Most recent echo with EF less than 20% with moderate to severe MR. ?Likely nonischemic cardiomyopathy, but  needs ischemic evaluation. ?-- Management per cardiology ?-- On milrinone, IV Lasix 40 mg twice daily ?-- Weaned off Levophed ?-- Strict I/O's, Daily weights ?-- Plan for L/R heart cath today ? ?Net IO Since Admission: -10,779.68 mL [01/21/22 1459] ? ? ? ?Cardiogenic shock (HCC) ?As outlined above ? ?Pleuritic chest pain ?Seems musculoskeletal.  Initially low suspicion for PE, patient had no hypoxia.  He later became hypoxic requiring 3 L/min and noted to have persistent tachycardia with worsening pleuritic chest pain. ?D-dimer is elevated above 9. ?Subsequent CTA chest confirmed right-sided PE. ?--Now on anticoagulation as outlined ?-- Supportive care with analgesia as needed ?-- Incentive spirometer ? ?Pleural effusion on left ?Status post bedside thoracentesis on 4/4 with 850 cc removed.  Pleural fluid culture negative negative for malignancy. ? ?AKI (acute kidney injury) (HCC) ?POA, resolved.  Likely prerenal etiology given increased home dose Lasix in days prior to admission. ?--Monitor BMP ? ?Hyperkalemia ?Resolved.  POA with potassium 6.1 in the setting of worsening renal function and potassium supplementation at home with his diuretic.  No EKG changes.   ?K today 4.4 ?-- Monitor BMP ? ?Syncope ?On admission, patient reported feeling dizzy and someone was able to hold him before falling down.  No chest pain.  Recently increased Lasix.  Patient has very low EF. ?Likely due to poor cardiac output and hypotension. ?-- Monitor orthostatic vitals ?-- Further evaluation pending clinical course ? ?Lactic acidosis ?POA, resolved.   ?Most likely secondary to decreased perfusio in the setting of cardiogenic shock  n.  No leukocytosis or fever.  CT chest was concerning for multifocal pneumonia, started on empiric antibiotics on admission. ? ? ?HTN (hypertension), malignant ?Admitted with cardiogenic shock, required Levophed. ?Home regimen metoprolol 100 mg daily, isosorbide, hydralazine 25 mg 3 times a day and Lasix 40  mg twice daily at home. ?--Currently on milrinone ?--Hold antihypertensives ?-- Cardiology following ? ? ?Alcohol use disorder, moderate, dependence (HCC) ?Last drink 8 days prior to admission.  He was admitted in [facility] for alcohol detox on 3/21, treated with Librium, discharged 3/24.  Denies any alcohol consumption since.  No signs of withdrawal. ?--Stop CIWA ?-- Monitor ? ?Tobacco abuse counseling ?Counseled regarding importance of cessation.  Nicotine patch ordered. ? ?Acute pulmonary embolism (HCC) ?Pt had severe persistent R pleuritic chest pain, tachycardia.  D-dimer >9.  CTA on 4/5 revealed "Acute pulmonary embolism in the multiple segmental and subsegmental branches in the right lung. There is small thrombus burden. There are no signs of acute right ventricular strain..." possible pulmonary infarction. ?--On heparin drip ?--Will transition to Eliquis after heart cath if no other procedures are planned ? ?CAP (community acquired pneumonia) ?Started on Rocephin and Zithromax. ?Antibiotic was changed to cefepime due to worsening leukocytosis, add Pseudomonas coverage given his history of alcoholism and just recently in recovery. ?--Continue cefepime. ?--Supportive care. ? ? ? ? ?  ? ?Subjective: Patient was sleeping sitting up in bed but woke easily when seen this morning.  He is n.p.o. for cardiac cath later today.  He reports intermittent right-sided chest pain continues but overall improved.  Denies other chest pain or significant shortness of breath.  No other acute complaints at this time. ? ?Physical Exam: ?Vitals:  ? 01/21/22 1411 01/21/22 1416 01/21/22 1421 01/21/22 1445  ?BP: 107/77 108/80 108/76 107/74  ?Pulse: (!) 115 (!) 115 (!) 114 (!) 125  ?Resp: (!) 22 (!) 21 (!) 25 (!) 23  ?Temp:      ?TempSrc:      ?SpO2: 94% 94% 94%   ?Weight:      ?Height:      ? ?General exam: awake, alert, no acute distress ?HEENT: atraumatic, clear conjunctiva, anicteric sclera, moist mucus membranes, hearing grossly  normal  ?Respiratory system: CTAB, no wheezes, rales or rhonchi, normal respiratory effort at rest, on 3 L/min Ord O2. ?Cardiovascular system: normal S1/S2, tachycardic, regular rhythm, no pedal edema, right IJ central line in place.   ?Gastrointestinal system: soft, NT, ND, no HSM felt, +bowel sounds. ?Central nervous system: A&O x3. no gross focal neurologic deficits, normal speech ?Extremities: moves all, no edema, normal tone ?Skin: dry, intact, normal temperature ?Psychiatry: normal mood, flat affect, judgement and insight appear normal ? ? ?Data Reviewed: ? ?Labs reviewed and notable for chloride 96, glucose 174, WBC 13.1, hemoglobin 9.7 ? ?Family Communication: Girlfriend arrived to bedside during encounter ? ?Disposition: ?Status is: Inpatient ?Remains inpatient appropriate because: Remains in ICU on IV therapies as above, cardiac cath planned for today. ? ? ? Planned Discharge Destination: Home ? ? ? ?Time spent: 35 minutes ? ?Author: ?Pennie Banter, DO ?01/21/2022 3:03 PM ? ?For on call review [email or web address].  ?

## 2022-01-21 NOTE — Consult Note (Signed)
ANTICOAGULATION CONSULT NOTE ? ?Pharmacy Consult for IV Heparin ?Indication: pulmonary embolus ? ?Patient Measurements: ?Height: 6\' 3"  (190.5 cm) ?Weight: 89 kg (196 lb 3.4 oz) ?IBW/kg (Calculated) : 84.5 ?Heparin Dosing Weight: 94.8 kg ? ?Labs: ?Recent Labs  ?  01/18/22 ?0755 01/19/22 ?0449 01/19/22 ?0449 01/19/22 ?1941 01/20/22 ?0321 01/20/22 ?0930 01/21/22 ?03/23/22  ?HGB  --  8.9*   < >  --  9.3*  --  9.7*  ?HCT  --  26.7*  --   --  28.4*  --  29.3*  ?PLT  --  206  --   --  242  --  289  ?APTT  --   --   --  39*  --   --   --   ?LABPROT  --   --   --  16.9*  --   --   --   ?INR  --   --   --  1.4*  --   --   --   ?HEPARINUNFRC  --   --   --   --  0.50 0.43 0.24*  ?CREATININE  --  1.20  --   --  1.04  --  0.94  ?TROPONINIHS 39*  --   --   --   --   --   --   ? < > = values in this interval not displayed.  ? ? ?Estimated Creatinine Clearance: 126.1 mL/min (by C-G formula based on SCr of 0.94 mg/dL). ? ? ?Medications:  ?No anticoagulation prior to admission per my chart review. Patient has been receiving  heparin for DVT prophylaxis while hospitalized. ? ?Assessment: ?Patient is a 41 y/o M with medical history including tobacco use disorder, alcohol use disorder, CHF who was admitted 01/17/22 with cardiogenic shock. D-dimer elevated and persistent tachycardia. Subsequently found to have "acute pulmonary embolism in the multiple segmental and subsegmental branches in the right lung. There is small thrombus burden. There are no signs of acute right ventricular strain". Pharmacy consulted to initiate heparin infusion for acute PE. ? ?Baseline aPTT 39s (elevated) and PT-INR 1.4 (elevated). CBC notable for stable anemia ? ?Goal of Therapy:  ?Heparin level 0.3-0.7 units/ml ?Monitor platelets by anticoagulation protocol: Yes ? ?  ?Plan:  ?--Heparin level is subtherapeutic ?--Bolus 1400 units x 1 ?--Increase heparin infusion to 1800 units/hr ?--Recheck HL in 6 hr after rate change ?--Daily CBC per protocol while on IV  heparin ?--No medication insurance on profile. Follow-up transition to oral anticoagulation / options ? ?03/19/22, PharmD, MBA ?01/21/2022 ?6:46 AM ? ? ? ? ?

## 2022-01-21 NOTE — Consult Note (Signed)
Pharmacy Antibiotic Note ? ?[name] is a 41 y.o. male with medical history including HFrEF with EF 30%, alcohol use disorder, alcoholic pancreatitis, tobacco use disorder, GERD, depression admitted on 01/17/2022 with  cardiogenic shock . Patient further c/o pleuritic chest pain and was found to have acute PE. Imaging on admission further concerning for pneumonia and patient was started on ceftriaxone and azithromycin. Despite implemented therapy, imaging showed worsening airspace disease. Pharmacy has now been consulted for cefepime dosing. ? ?Plan: ? ?Cefepime 2 g IV q8h ? ?Height: 6\' 3"  (190.5 cm) ?Weight: 89 kg (196 lb 3.4 oz) ?IBW/kg (Calculated) : 84.5 ? ?Temp (24hrs), Avg:98.4 ?F (36.9 ?C), Min:98.3 ?F (36.8 ?C), Max:98.4 ?F (36.9 ?C) ? ?Recent Labs  ?Lab 01/17/22 ?0925 01/17/22 ?1506 01/17/22 ?1932 01/18/22 ?03/20/22 01/19/22 ?0449 01/20/22 ?0321 01/21/22 ?03/23/22  ?WBC 8.1  --   --   --  9.7 11.6* 13.1*  ?CREATININE 2.53* 2.32* 2.40* 2.30* 1.20 1.04 0.94  ?LATICACIDVEN  --  4.7*  --   --  0.8  --   --   ?  ?Estimated Creatinine Clearance: 126.1 mL/min (by C-G formula based on SCr of 0.94 mg/dL).   ? ?Allergies  ?Allergen Reactions  ? Lisinopril Swelling  ?  Angioedema ?  ? Shellfish Allergy Swelling  ? ? ?Antimicrobials this admission: ?Azithromycin 4/3 >> 4/5 ?Ceftriaxone 4/3 >> 4/6 ?Cefepime 4/7 >>  ? ?Dose adjustments this admission: ?N/A ? ?Microbiology results: ?4/3 BCx: NGTD ?4/3 Expanded respiratory panel: (-) ?4/3 SARS-CoV-2 / influenza A / B: (-) ?4/3 MRSA PCR: (-) ?4/4 Pleural fluid: NGTD ? ?Thank you for allowing pharmacy to be a part of this patient?s care. ? ?6/4 ?01/21/2022 8:51 AM ? ?

## 2022-01-21 NOTE — Progress Notes (Signed)
? ? ?Progress Note ? ?Patient Name: Mario Beck ?Date of Encounter: 01/21/2022 ? ?Primary Cardiologist: New to [facility] - consult by End ? ?Subjective  ? ?Dyspnea improving. No chest pain, palpitations, dizziness, presyncope, fo syncope. Documented UOP 2.2 L for the past 24 hours, net - 10.1 L for the admission. Weight 90.8 trending to 89 kg over the past 24 hours. Renal function stable. He remains on milrinone (0.125) and heparin gtts along with IV Lasix 40 mg bid. BP in the 90s to low 100s mmHg systolic.  ? ?Inpatient Medications  ?  ?Scheduled Meds: ? Chlorhexidine Gluconate Cloth  6 each Topical Daily  ? folic acid  1 mg Oral Daily  ? furosemide  40 mg Intravenous BID  ? heparin  1,400 Units Intravenous Once  ? lidocaine  1 patch Transdermal Q24H  ? multivitamin with minerals  1 tablet Oral Daily  ? nicotine  21 mg Transdermal Daily  ? sodium chloride flush  3 mL Intravenous Q12H  ? thiamine  100 mg Oral Daily  ? ?Continuous Infusions: ? sodium chloride 10 mL/hr at 01/20/22 1800  ? ceFEPime (MAXIPIME) IV    ? heparin 1,600 Units/hr (01/21/22 0114)  ? milrinone 0.125 mcg/kg/min (01/20/22 2109)  ? ?PRN Meds: ?acetaminophen **OR** acetaminophen, hydrOXYzine, ondansetron (ZOFRAN) IV, oxyCODONE, polyethylene glycol  ? ?Vital Signs  ?  ?Vitals:  ? 01/21/22 0400 01/21/22 0413 01/21/22 0458 01/21/22 0272  ?BP: 107/64   110/62  ?Pulse: (!) 120 (!) 117  (!) 119  ?Resp: (!) 37 18  20  ?Temp:      ?TempSrc:      ?SpO2: (!) 83% 96%  94%  ?Weight:   89 kg   ?Height:      ? ? ?Intake/Output Summary (Last 24 hours) at 01/21/2022 0909 ?Last data filed at 01/21/2022 0600 ?Gross per 24 hour  ?Intake 1438.36 ml  ?Output 3405 ml  ?Net -1966.64 ml  ? ?Filed Weights  ? 01/19/22 0500 01/20/22 0500 01/21/22 0458  ?Weight: 94.8 kg 90.8 kg 89 kg  ? ? ?Telemetry  ?  ?No new tracings - Personally Reviewed ? ?ECG  ?  ?Sinus tachycardia, 120s to 140s bpm - Personally Reviewed ? ?Physical Exam  ? ?GEN: No acute distress.   ?Neck: No JVD. ?Cardiac:  Tachycardic, no murmurs, rubs, or gallops.  ?Respiratory: Clear to auscultation bilaterally.  ?GI: Soft, nontender, non-distended.   ?MS: No edema; No deformity. ?Neuro:  Alert and oriented x 3; Nonfocal.  ?Psych: Normal affect. ? ?Labs  ?  ?Chemistry ?Recent Labs  ?Lab 01/17/22 ?1932 01/18/22 ?5366 01/19/22 ?0449 01/20/22 ?0321 01/21/22 ?4403  ?NA 136   < > 136 134* 135  ?K 6.0*   < > 3.8 3.5 4.4  ?CL 100   < > 102 96* 96*  ?CO2 18*   < > 27 29 28   ?GLUCOSE 124*   < > 144* 217* 174*  ?BUN 30*   < > 19 13 13   ?CREATININE 2.40*   < > 1.20 1.04 0.94  ?CALCIUM 8.5*   < > 8.3* 8.5* 9.1  ?PROT 7.3  --   --   --   --   ?ALBUMIN 3.3*  --   --   --   --   ?AST 94*  --   --   --   --   ?ALT 42  --   --   --   --   ?ALKPHOS 314*  --   --   --   --   ?  BILITOT 1.8*  --   --   --   --   ?GFRNONAA 34*   < > >60 >60 >60  ?ANIONGAP 18*   < > 7 9 11   ? < > = values in this interval not displayed.  ?  ? ?Hematology ?Recent Labs  ?Lab 01/19/22 ?0449 01/20/22 ?0321 01/21/22 ?2010  ?WBC 9.7 11.6* 13.1*  ?RBC 3.21* 3.40* 3.55*  ?HGB 8.9* 9.3* 9.7*  ?HCT 26.7* 28.4* 29.3*  ?MCV 83.2 83.5 82.5  ?MCH 27.7 27.4 27.3  ?MCHC 33.3 32.7 33.1  ?RDW 17.7* 18.1* 17.9*  ?PLT 206 242 289  ? ? ?Cardiac EnzymesNo results for input(s): TROPONINI in the last 168 hours. No results for input(s): TROPIPOC in the last 168 hours.  ? ?BNP ?Recent Labs  ?Lab 01/17/22 ?0925  ?BNP 2,253.2*  ?  ? ?DDimer  ?Recent Labs  ?Lab 01/19/22 ?1542  ?DDIMER 9.39*  ?  ? ?Radiology  ?  ?CT Angio Chest Pulmonary Embolism (PE) W or WO Contrast ? ?Result Date: 01/19/2022 ?IMPRESSION: Acute pulmonary embolism in the multiple segmental and subsegmental branches in the right lung. There is small thrombus burden. There are no signs of acute right ventricular strain. There is significant interval worsening of infiltrates in both lungs, especially in the posterior mid and lower lung fields. Findings suggest significant interval worsening of multifocal pneumonia. Part of the infiltrates  in the right lower lung fields may be due to pulmonary infarction. Fatty liver. Marked cardiomegaly. There are scattered coronary artery calcifications. Imaging finding of acute pulmonary embolism was relayed to patient's provider Dr. Denton Lank by telephone call. Electronically Signed   By: Ernie Avena M.D.   On: 01/19/2022 18:55   ? ?Cardiac Studies  ? ?2D echo 01/18/2022: ?1. Left ventricular ejection fraction, by estimation, is 20 to 25%. The  ?left ventricle has severely decreased function. The left ventricle  ?demonstrates global hypokinesis. The left ventricular internal cavity size  ?was severely dilated. Indeterminate  ?diastolic filling due to E-A fusion.  ? 2. Right ventricular systolic function is moderately reduced. The right  ?ventricular size is mildly enlarged.  ? 3. Left atrial size was mildly dilated.  ? 4. Right atrial size was mildly dilated.  ? 5. Moderate pleural effusion in the left lateral region.  ? 6. The mitral valve is normal in structure. Mild mitral valve  ?regurgitation.  ? 7. Tricuspid valve regurgitation is moderate.  ? 8. The aortic valve is tricuspid. Aortic valve regurgitation is not  ?visualized. No aortic stenosis is present.  ? ?Patient Profile  ?   ?41 y.o. 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 ? ?Assessment & Plan  ?  ?1. Acute on chronic HFrEF with pleural effusion: ?-Suspected to be in the setting of alcohol-induced cardiomyopathy and medical nonadherence  ?-Status post thoracentesis this admission ?-Has diuresed well with augmentation with milrinone, remains on 0.125 ?-Plan for [facility] later today to further evaluate etiology of his cardiomyopathy and to further understand his hemodynamic status ?-Continue milrinone and IV Lasix at current doses pending cath results ?-No currently able to add GDMT secondary to  hypotension, escalate GDMT as tolerated following cardiac cath ?-Will benefit from establishing with the advanced heart failure clinic in [place] ?-Follow up echo on maximally tolerated GDMT and with alcohol cessation in several months to re evaluate LVSF with recommendation to be  evaluated by EP if his cardiomyopathy persists with an EF < 35% ?-CHF education  ? ?2. AKI: ?-Possibly in the setting of cardiorenal syndrome ?-Improved with milrinone, taper off as indicated by cardiac cath ? ?3. PE: ?-IV heparin for now until it is clear he will not need further inpatient invasive procedures ?-Will need DOAC at discharge  ? ?4. Alcohol and tobacco use: ?-Complete cessation is encouraged  ? ?5. Anemia: ?-HGB stable ? ? ?Shared Decision Making/Informed Consent{ ? ?The risks [stroke (1 in 1000), death (1 in 1000), kidney failure [usually temporary] (1 in 500), bleeding (1 in 200), allergic reaction [possibly serious] (1 in 200)], benefits (diagnostic support and management of coronary artery disease) and alternatives of a cardiac catheterization were discussed in detail with Mr. Pomplun and he is willing to proceed.  ? ?For questions or updates, please contact [facility] ?Please consult [email or web address] for contact info under Cardiology/STEMI. ?  ? ?Signed, ?Eula Listen, PA-C ?[facility] ?Pager: [phone] ?01/21/2022, 9:09 AM ? ?

## 2022-01-22 ENCOUNTER — Encounter: Payer: Self-pay | Admitting: Cardiovascular Disease

## 2022-01-22 DIAGNOSIS — I2602 Saddle embolus of pulmonary artery with acute cor pulmonale: Secondary | ICD-10-CM

## 2022-01-22 LAB — BASIC METABOLIC PANEL
Anion gap: 8 (ref 5–15)
BUN: 11 mg/dL (ref 6–20)
CO2: 30 mmol/L (ref 22–32)
Calcium: 8.7 mg/dL — ABNORMAL LOW (ref 8.9–10.3)
Chloride: 97 mmol/L — ABNORMAL LOW (ref 98–111)
Creatinine, Ser: 0.85 mg/dL (ref 0.61–1.24)
GFR, Estimated: >60 mL/min (ref >60)
Glucose, Bld: 135 mg/dL — ABNORMAL HIGH (ref 70–99)
Potassium: 4 mmol/L (ref 3.5–5.1)
Sodium: 135 mmol/L (ref 135–145)

## 2022-01-22 LAB — CBC
HCT: 25.7 % — ABNORMAL LOW (ref 39.0–52.0)
Hemoglobin: 8.6 g/dL — ABNORMAL LOW (ref 13.0–17.0)
MCH: 27.3 pg (ref 26.0–34.0)
MCHC: 33.5 g/dL (ref 30.0–36.0)
MCV: 81.6 fL (ref 80.0–100.0)
Platelets: 279 10*3/uL (ref 150–400)
RBC: 3.15 MIL/uL — ABNORMAL LOW (ref 4.22–5.81)
RDW: 18 % — ABNORMAL HIGH (ref 11.5–15.5)
WBC: 12.5 10*3/uL — ABNORMAL HIGH (ref 4.0–10.5)
nRBC: 2.1 % — ABNORMAL HIGH (ref 0.0–0.2)

## 2022-01-22 LAB — CULTURE, BLOOD (ROUTINE X 2)
Culture: NO GROWTH
Culture: NO GROWTH
Special Requests: ADEQUATE

## 2022-01-22 LAB — MAGNESIUM: Magnesium: 2 mg/dL (ref 1.7–2.4)

## 2022-01-22 MED ORDER — CARVEDILOL 3.125 MG PO TABS
3.1250 mg | ORAL_TABLET | Freq: Two times a day (BID) | ORAL | Status: DC
  Administered 2022-01-22 – 2022-01-28 (×13): 3.125 mg via ORAL
  Filled 2022-01-22 (×13): qty 1

## 2022-01-22 MED ORDER — GUAIFENESIN 100 MG/5ML PO LIQD
5.0000 mL | ORAL | Status: DC | PRN
  Administered 2022-01-22 – 2022-01-26 (×6): 5 mL via ORAL
  Filled 2022-01-22 (×2): qty 10
  Filled 2022-01-22: qty 5
  Filled 2022-01-22: qty 10
  Filled 2022-01-22 (×2): qty 5
  Filled 2022-01-22: qty 10

## 2022-01-22 NOTE — Progress Notes (Addendum)
?Progress Note ? ? ?Patient: Mario Beck VOJ:[id number] DOB: Dec 24, 1980 DOA: 01/17/2022     5 ?DOS: the patient was seen and examined on 01/22/2022 ?  ?Brief hospital course: ?HPI taken from ICU notes: ?"41 yo male with a history of cardiomyopathy presented to the [facility] ER on 4/3 complaining of 3-4 days of dyspnea, fatigue and weight gain. He says that he has been compliant with his medications which include furosemide, metoprolol, nexium, potassium and sertraline.  However he says that the lasix has not been working for him in the last week or so and his urine output has become very minimal and dark.  He has noted increasing dyspnea when he climbs a flight of stairs in his apartment and yesterday passed out twice when he was walking across the parking lot.   ?He was admitted to [facility] for alcohol detox on 3/21, treated with lithium, and discharged on 3/24.  He says that he has been compliant with his medications. ?He was supposed to see Dr. Welton Flakes with cardiology after his last discharge for alcoholic pancreatitis, acute decompensated CHF in 06/2021, but he missed his appointments and has not followed up.  ?He smokes 1 pack of cigarettes daily.  He quit drinking on 3/21 and has not had alcohol since then.  He denies illicit drug use." ? ?Patient was admitted to ICU with cardiogenic shock. ?Cardiology consulted.   ?Plan for L/R heart cath once euvolemic. ?Currently on milrinone and IV Lasix.   ?Weaned off Levophed.  ? ?Transferred to [facility] service 4/5. ?Found to have acute PE on the right with probable pulmonary infarct.  Started on heparin drip. ? ?Assessment and Plan: ?* Acute on chronic HFrEF (heart failure with reduced ejection fraction) (HCC) ?Admitted to ICU with cardiogenic shock 4/3.  Presented with worsening dyspnea and orthopnea, weight gain, elevated BNP 2253, and bilateral pleural effusions. ?Most recent echo with EF less than 20% with moderate to severe MR. ?Right and left cardiac cath on 4/7 without  significant CAD, findings consistent with nonischemic cardiomyopathy (see report, or conclusion below). ?-- Management per cardiology ?-- Transitioning off milrinone today ?-- IV Lasix 40 mg twice daily ?--Started on low-dose Coreg ?-- Weaned off Levophed ?-- Strict I/O's, Daily weights ? ?Net IO Since Admission: -10,779.68 mL [01/21/22 1459] ? ? ? ?Cardiogenic shock (HCC) ?As outlined above ? ?Acute pulmonary embolism (HCC) ?Pt had severe persistent R pleuritic chest pain, tachycardia.  D-dimer >9.  CTA on 4/5 revealed "Acute pulmonary embolism in the multiple segmental and subsegmental branches in the right lung. There is small thrombus burden. There are no signs of acute right ventricular strain..." possible pulmonary infarction. ?-- Transitioned off heparin drip to Eliquis after cath 4/7 ?-- Continue Eliquis ? ?Pleuritic chest pain ?Seems musculoskeletal.  Initially low suspicion for PE, patient had no hypoxia.  He later became hypoxic requiring 3 L/min and noted to have persistent tachycardia with worsening pleuritic chest pain. ?D-dimer is elevated above 9. ?Subsequent CTA chest confirmed right-sided PE. ?--Now on anticoagulation as outlined ?-- Supportive care with analgesia as needed ?-- Incentive spirometer ? ?Pleural effusion on left ?Status post bedside thoracentesis on 4/4 with 850 cc removed.  Pleural fluid culture negative negative for malignancy. ? ?CAP (community acquired pneumonia) ?Started on Rocephin and Zithromax. ?Antibiotic was changed to cefepime due to worsening leukocytosis, add Pseudomonas coverage given his history of alcoholism and just recently in recovery. ?--Continue cefepime. ?--Supportive care. ? ?AKI (acute kidney injury) (HCC) ?POA, resolved.  Likely prerenal etiology  given increased home dose Lasix in days prior to admission. ?--Monitor BMP ? ?Hyperkalemia ?Resolved.  POA with potassium 6.1 in the setting of worsening renal function and potassium supplementation at home with his  diuretic.  No EKG changes.   ?K today 4.4 ?-- Monitor BMP ? ?Syncope ?On admission, patient reported feeling dizzy and someone was able to hold him before falling down.  No chest pain.  Recently increased Lasix.  Patient has very low EF. ?Likely due to poor cardiac output and hypotension. ?-- Monitor orthostatic vitals ?-- Further evaluation pending clinical course ? ?Lactic acidosis ?POA, resolved.   ?Most likely secondary to decreased perfusio in the setting of cardiogenic shock n.  No leukocytosis or fever.  CT chest was concerning for multifocal pneumonia, started on empiric antibiotics on admission. ? ? ?HTN (hypertension), malignant ?Admitted with cardiogenic shock, required Levophed. ?Home regimen metoprolol 100 mg daily, isosorbide, hydralazine 25 mg 3 times a day and Lasix 40 mg twice daily at home. ?--Currently on milrinone ?--Hold antihypertensives ?-- Cardiology following ? ? ?Alcohol use disorder, moderate, dependence (HCC) ?Last drink 8 days prior to admission.  He was admitted in [facility] for alcohol detox on 3/21, treated with Librium, discharged 3/24.  Denies any alcohol consumption since.  No signs of withdrawal. ?--Stop CIWA ?-- Monitor ? ?Tobacco abuse counseling ?Counseled regarding importance of cessation.  Nicotine patch ordered. ? ? ? ? ?  ? ?Subjective: Patient was sleeping when seen in ICU this morning.  No family at bedside during the time.  No acute events reported.  Patient wakes briefly but quickly returns to sleep ? ?Physical Exam: ?Vitals:  ? 01/22/22 0900 01/22/22 1000 01/22/22 1100 01/22/22 1130  ?BP: 110/61 109/74 (!) 91/43 108/66  ?Pulse: (!) 116 (!) 110 (!) 111 (!) 107  ?Resp: 19 20    ?Temp:      ?TempSrc:      ?SpO2: 98% 100% 100% 98%  ?Weight:      ?Height:      ? ?General exam: Sleeping comfortably, wakes briefly, no acute distress ?HEENT: Moist mucous membranes, hearing grossly normal ?Respiratory system: normal respiratory effort at rest, on 3 L/min [place] O2 with SPO2  100%. ?Cardiovascular system: tachycardic, regular rhythm, no pedal edema, right IJ central line in place.   ?Central nervous system: Unable to evaluate due to somnolence ?Extremities: moves all, no edema, normal tone ?Skin: dry, intact, normal temperature ?Psychiatry: Unable to evaluate due to somnolence ? ? ?Data Reviewed: ? ?Labs reviewed and notable for chloride 9 7, glucose 135, calcium 8.7, WBC 12.5 improved, a hemoglobin 8.6 ? ? ? ?L/R cardiac cath yesterday: ?Conclusion: ?  Mid LAD lesion is 40% stenosed. ?  There is severe left ventricular systolic dysfunction. ?  LV end diastolic pressure is mildly elevated. ?  The left ventricular ejection fraction is less than 25% by visual estimate. ?  ?1.  Moderate one-vessel coronary artery disease with 40% stenosis in the mid LAD.  No evidence of obstructive disease. ?2.  Severely reduced LV systolic function with an EF of 15%. ?3.  Right heart catheterization showed mildly elevated filling pressures, mild pulmonary hypertension and normal cardiac output. ?  ? ? ?Family Communication: None at bedside during rounds ? ?Disposition: ?Status is: Inpatient ?Remains inpatient appropriate because: Remains in ICU on IV therapies as above, cardiac cath planned for today. ? ? ? Planned Discharge Destination: Home ? ? ? ?Time spent: 35 minutes include time spent at bedside, and coordination of care with  consultants and ancillary staff, review and interpretation of diagnostic studies. ? ?Author: ?Pennie Banter, DO ?01/22/2022 12:38 PM ? ?For on call review [email or web address].  ?

## 2022-01-22 NOTE — Progress Notes (Signed)
? ?Progress Note ? ?Patient Name: Mario Beck ?Date of Encounter: 01/22/2022 ? ?[facility] Cardiologist:   ? ?Subjective  ? ?41 year old gentleman with history of a nonischemic cardiomyopathy likely due to alcohol abuse.  He was admitted with congestive heart failure.  He has diuresed 12.8 liters so far during this admission .  ? ?Will start coreg 3.125 bid today ? ? ?Inpatient Medications  ?  ?Scheduled Meds: ? apixaban  10 mg Oral BID  ? Followed by  ? [START ON 01/28/2022] apixaban  5 mg Oral BID  ? Chlorhexidine Gluconate Cloth  6 each Topical Daily  ? digoxin  0.125 mg Oral Daily  ? folic acid  1 mg Oral Daily  ? furosemide  40 mg Oral BID  ? lidocaine  1 patch Transdermal Q24H  ? losartan  12.5 mg Oral Daily  ? multivitamin with minerals  1 tablet Oral Daily  ? nicotine  21 mg Transdermal Daily  ? sodium chloride flush  3 mL Intravenous Q12H  ? sodium chloride flush  3 mL Intravenous Q12H  ? spironolactone  12.5 mg Oral Daily  ? thiamine  100 mg Oral Daily  ? ?Continuous Infusions: ? sodium chloride Stopped (01/21/22 1310)  ? sodium chloride    ? ceFEPime (MAXIPIME) IV Stopped (01/22/22 0340)  ? milrinone 0.125 mcg/kg/min (01/22/22 0900)  ? ?PRN Meds: ?sodium chloride, acetaminophen **OR** acetaminophen, guaiFENesin, hydrOXYzine, ondansetron (ZOFRAN) IV, oxyCODONE, polyethylene glycol, sodium chloride flush  ? ?Vital Signs  ?  ?Vitals:  ? 01/22/22 0600 01/22/22 0700 01/22/22 0800 01/22/22 0830  ?BP: 102/70 (!) 83/67 114/70   ?Pulse: 99 (!) 104 (!) 117 (!) 110  ?Resp: 16 19 (!) 26 (!) 23  ?Temp:      ?TempSrc:      ?SpO2: 99% 99% (!) 82% 97%  ?Weight:      ?Height:      ? ? ?Intake/Output Summary (Last 24 hours) at 01/22/2022 1034 ?Last data filed at 01/22/2022 0900 ?Gross per 24 hour  ?Intake 656.79 ml  ?Output 3550 ml  ?Net -2893.21 ml  ? ? ?  01/21/2022  ?  4:58 AM 01/20/2022  ?  5:00 AM 01/19/2022  ?  5:00 AM  ?Last 3 Weights  ?Weight (lbs) 196 lb 3.4 oz 200 lb 2.8 oz 208 lb 15.9 oz  ?Weight (kg) 89 kg 90.8 kg  94.8 kg  ?   ? ?Telemetry  ?  ?Sinus tach at 110  - Personally Reviewed ? ?ECG  ?  ? - Personally Reviewed ? ?Physical Exam  ?  ?GEN: middle age man,  NAD ,   ?Neck: No JVD ?Cardiac:   RR, tachy  ?Respiratory: Clear to auscultation bilaterally. ?GI: Soft, nontender, non-distended  ?MS: No edema; No deformity. ?Neuro:  Nonfocal  ?Psych: Normal affect  ? ?Labs  ?  ?High Sensitivity Troponin:   ?Recent Labs  ?Lab 01/17/22 ?1932 01/17/22 ?2252 01/18/22 ?9326 01/18/22 ?0755  ?TROPONINIHS 28* 29* 32* 39*  ?   ?Chemistry ?Recent Labs  ?Lab 01/17/22 ?1932 01/18/22 ?7124 01/20/22 ?0321 01/21/22 ?5809 01/22/22 ?9833  ?NA 136   < > 134* 135 135  ?K 6.0*   < > 3.5 4.4 4.0  ?CL 100   < > 96* 96* 97*  ?CO2 18*   < > 29 28 30   ?GLUCOSE 124*   < > 217* 174* 135*  ?BUN 30*   < > 13 13 11   ?CREATININE 2.40*   < > 1.04 0.94 0.85  ?  CALCIUM 8.5*   < > 8.5* 9.1 8.7*  ?MG  --    < > 1.8 2.1 2.0  ?PROT 7.3  --   --   --   --   ?ALBUMIN 3.3*  --   --   --   --   ?AST 94*  --   --   --   --   ?ALT 42  --   --   --   --   ?ALKPHOS 314*  --   --   --   --   ?BILITOT 1.8*  --   --   --   --   ?GFRNONAA 34*   < > >60 >60 >60  ?ANIONGAP 18*   < > 9 11 8   ? < > = values in this interval not displayed.  ?  ?Lipids No results for input(s): CHOL, TRIG, HDL, LABVLDL, LDLCALC, CHOLHDL in the last 168 hours.  ?Hematology ?Recent Labs  ?Lab 01/20/22 ?0321 01/21/22 ?03/23/22 01/22/22 ?03/24/22  ?WBC 11.6* 13.1* 12.5*  ?RBC 3.40* 3.55* 3.15*  ?HGB 9.3* 9.7* 8.6*  ?HCT 28.4* 29.3* 25.7*  ?MCV 83.5 82.5 81.6  ?MCH 27.4 27.3 27.3  ?MCHC 32.7 33.1 33.5  ?RDW 18.1* 17.9* 18.0*  ?PLT 242 289 279  ? ?Thyroid No results for input(s): TSH, FREET4 in the last 168 hours.  ?BNP ?Recent Labs  ?Lab 01/17/22 ?0925  ?BNP 2,253.2*  ?  ?DDimer  ?Recent Labs  ?Lab 01/19/22 ?1542  ?DDIMER 9.39*  ?  ? ?Radiology  ?  ?CARDIAC CATHETERIZATION ? ?Result Date: 01/21/2022 ?  Mid LAD lesion is 40% stenosed.   There is severe left ventricular systolic dysfunction.   LV end diastolic pressure  is mildly elevated.   The left ventricular ejection fraction is less than 25% by visual estimate. 1.  Moderate one-vessel coronary artery disease with 40% stenosis in the mid LAD.  No evidence of obstructive disease. 2.  Severely reduced LV systolic function with an EF of 15%. 3.  Right heart catheterization showed mildly elevated filling pressures, mild pulmonary hypertension and normal cardiac output. Recommendations: The patient has nonischemic cardiomyopathy.  We will switch to oral furosemide and try to add heart failure medications.  Continue milrinone likely for another day.   ? ?Cardiac Studies  ? ?  ? ?Patient Profile  ?   ?41 y.o. male   ? ?Assessment & Plan  ?  ?1.  Acute on chronic congestive heart failure: He seems to be doing better.  We will stop milrinone today.  Start low-dose carvedilol.  We will watch him in the ICU today and consider transfer to telemetry tomorrow. ? ?2.  Small pulmonary embolus.  Continue anticoagulation.  He still has a cough and some pleuritic chest pain. ?   ? ?For questions or updates, please contact [facility] ?Please consult [email or web address] for contact info under  ? ?  ?   ?Signed, ?24, MD  ?01/22/2022, 10:34 AM   ? ?

## 2022-01-22 NOTE — Progress Notes (Signed)
PHARMACY CONSULT NOTE ? ?Pharmacy Consult for Electrolyte Monitoring and Replacement  ? ?Recent Labs: ?Potassium (mmol/L)  ?Date Value  ?01/22/2022 4.0  ? ?Magnesium (mg/dL)  ?Date Value  ?01/22/2022 2.0  ? ?Calcium (mg/dL)  ?Date Value  ?01/22/2022 8.7 (L)  ? ?Albumin (g/dL)  ?Date Value  ?01/17/2022 3.3 (L)  ? ?Phosphorus (mg/dL)  ?Date Value  ?01/20/2022 3.6  ? ?Sodium (mmol/L)  ?Date Value  ?01/22/2022 135  ? ?Assessment: ?80 YOM presenting with dyspnea, fatigue, and weight gain. PMH CHF EF 30%, alcohol dependence, multiple episodes alcoholic pancreatitis, cigarette smoking, GERD, depression. In ICU with cardiogenic shock due to acute decompensated systolic heart failure.  ? ?Medications:  ?Milrinone 0.125 mcg/kg/min (weaning) ?PO Lasix 40 mg BID ?Digoxin 0.125mg  po daily ?Spironolactone 12.5mg  daily ? ?Goal of Therapy:  ?K >= 4 ?Mg >= 2 ?All other electrolytes within normal limits ? ?Plan:  ?No electrolyte replacement indicated at this time ?Follow-up electrolytes with AM labs tomorrow ? ?Bhavana Kady A ?01/22/2022 ?10:05 AM ? ?

## 2022-01-22 NOTE — Progress Notes (Signed)
Nutrition Brief Note ? ?Patient identified on the Malnutrition Screening Tool (MST) Report ? ?Wt Readings from Last 15 Encounters:  ?01/21/22 89 kg  ?12/08/21 86.2 kg  ?06/21/21 89 kg  ?05/27/21 83 kg  ?05/26/21 83.9 kg  ?05/04/21 83.9 kg  ?02/19/21 87.4 kg  ?01/27/21 83.9 kg  ?12/11/20 86.2 kg  ?11/29/20 86.2 kg  ?10/23/20 86.2 kg  ?09/19/20 90.7 kg  ?09/16/20 90.7 kg  ?03/31/20 86.2 kg  ?03/30/20 86.2 kg  ? ?41 YOM presenting with dyspnea, fatigue, and weight gain. PMH CHF EF 30%, alcohol dependence, multiple episodes alcoholic pancreatitis, cigarette smoking, GERD, depression. In ICU with cardiogenic shock due to acute decompensated systolic heart failure.  ? ?Pt admitted with CHF.  ? ?RD will liberalize diet to 2 gram sodium to increase variety of meal selections. ? ?Medications reviewed and include folic acid, aldactone, thiamine, and lasix. ? ?Current diet order is Heart Healthy, patient is consuming approximately 100% of meals at this time. Labs and medications reviewed.  ? ?No nutrition interventions warranted at this time. If nutrition issues arise, please consult RD.  ? ?Levada Schilling, RD, LDN, CDCES ?Registered Dietitian II ?Certified Diabetes Care and Education Specialist ?Please refer to [facility] for RD and/or RD on-call/weekend/after hours pager   ?

## 2022-01-22 NOTE — Consult Note (Signed)
Pharmacy Antibiotic Note ? ?Mario Beck is Beck 41 y.o. male with medical history including HFrEF with EF 30%, alcohol use disorder, alcoholic pancreatitis, tobacco use disorder, GERD, depression admitted on 01/17/2022 with  cardiogenic shock . Patient further c/o pleuritic chest pain and was found to have acute PE. Imaging on admission further concerning for pneumonia and patient was started on ceftriaxone and azithromycin. Despite implemented therapy, imaging showed worsening airspace disease. Pharmacy has now been consulted for cefepime dosing. ? ?Plan: ? ?Cefepime 2 g IV q8h ? ? ?Height: 6\' 3"  (190.5 cm) ?Weight: 89 kg (196 lb 3.4 oz) ?IBW/kg (Calculated) : 84.5 ? ?Temp (24hrs), Avg:98.4 ?F (36.9 ?C), Min:98 ?F (36.7 ?C), Max:98.7 ?F (37.1 ?C) ? ?Recent Labs  ?Lab 01/17/22 ?0925 01/17/22 ?1506 01/17/22 ?1932 01/18/22 ?03/20/22 01/19/22 ?0449 01/20/22 ?0321 01/21/22 ?03/23/22 01/22/22 ?03/24/22  ?WBC 8.1  --   --   --  9.7 11.6* 13.1* 12.5*  ?CREATININE 2.53* 2.32*   < > 2.30* 1.20 1.04 0.94 0.85  ?LATICACIDVEN  --  4.7*  --   --  0.8  --   --   --   ? < > = values in this interval not displayed.  ? ?  ?Estimated Creatinine Clearance: 139.5 mL/min (by C-G formula based on SCr of 0.85 mg/dL).   ? ?Allergies  ?Allergen Reactions  ? Lisinopril Swelling  ?  Angioedema ?  ? Shellfish Allergy Swelling  ? ? ?Antimicrobials this admission: ?Azithromycin 4/3 >> 4/5 ?Ceftriaxone 4/3 >> 4/6 ?Cefepime 4/7 >>  ? ?Dose adjustments this admission: ?N/Beck ? ?Microbiology results: ?4/3 BCx: NGTD ?4/3 Expanded respiratory panel: (-) ?4/3 SARS-CoV-2 / influenza Beck / B: (-) ?4/3 MRSA PCR: (-) ?4/4 Pleural fluid: NGTD ? ?Thank you for allowing pharmacy to be Beck part of this patientMarios Beck. ? ?Mario Beck ?01/22/2022 10:09 AM ? ?

## 2022-01-23 LAB — BASIC METABOLIC PANEL
Anion gap: 7 (ref 5–15)
BUN: 12 mg/dL (ref 6–20)
CO2: 29 mmol/L (ref 22–32)
Calcium: 8.7 mg/dL — ABNORMAL LOW (ref 8.9–10.3)
Chloride: 95 mmol/L — ABNORMAL LOW (ref 98–111)
Creatinine, Ser: 0.88 mg/dL (ref 0.61–1.24)
GFR, Estimated: >60 mL/min (ref >60)
Glucose, Bld: 183 mg/dL — ABNORMAL HIGH (ref 70–99)
Potassium: 3.9 mmol/L (ref 3.5–5.1)
Sodium: 131 mmol/L — ABNORMAL LOW (ref 135–145)

## 2022-01-23 LAB — CBC
HCT: 25.7 % — ABNORMAL LOW (ref 39.0–52.0)
Hemoglobin: 8.5 g/dL — ABNORMAL LOW (ref 13.0–17.0)
MCH: 26.9 pg (ref 26.0–34.0)
MCHC: 33.1 g/dL (ref 30.0–36.0)
MCV: 81.3 fL (ref 80.0–100.0)
Platelets: 322 10*3/uL (ref 150–400)
RBC: 3.16 MIL/uL — ABNORMAL LOW (ref 4.22–5.81)
RDW: 18.4 % — ABNORMAL HIGH (ref 11.5–15.5)
WBC: 13.9 10*3/uL — ABNORMAL HIGH (ref 4.0–10.5)
nRBC: 0.6 % — ABNORMAL HIGH (ref 0.0–0.2)

## 2022-01-23 LAB — MAGNESIUM: Magnesium: 1.8 mg/dL (ref 1.7–2.4)

## 2022-01-23 MED ORDER — SPIRONOLACTONE 25 MG PO TABS
25.0000 mg | ORAL_TABLET | Freq: Every day | ORAL | Status: DC
  Administered 2022-01-23 – 2022-01-28 (×6): 25 mg via ORAL
  Filled 2022-01-23 (×5): qty 1

## 2022-01-23 MED ORDER — POTASSIUM CHLORIDE CRYS ER 20 MEQ PO TBCR
20.0000 meq | EXTENDED_RELEASE_TABLET | Freq: Once | ORAL | Status: AC
  Administered 2022-01-23: 20 meq via ORAL
  Filled 2022-01-23: qty 1

## 2022-01-23 MED ORDER — MAGNESIUM SULFATE 2 GM/50ML IV SOLN
2.0000 g | Freq: Once | INTRAVENOUS | Status: AC
  Administered 2022-01-23: 2 g via INTRAVENOUS
  Filled 2022-01-23: qty 50

## 2022-01-23 MED ORDER — FUROSEMIDE 40 MG PO TABS
40.0000 mg | ORAL_TABLET | Freq: Every day | ORAL | Status: DC
Start: 2022-01-24 — End: 2022-01-28
  Administered 2022-01-24 – 2022-01-28 (×5): 40 mg via ORAL
  Filled 2022-01-23 (×5): qty 1

## 2022-01-23 NOTE — Consult Note (Signed)
Pharmacy Antibiotic Note ? ?Mario Beck is a 41 y.o. male with medical history including HFrEF with EF 30%, alcohol use disorder, alcoholic pancreatitis, tobacco use disorder, GERD, depression admitted on 01/17/2022 with  cardiogenic shock . Patient further c/o pleuritic chest pain and was found to have acute PE. Imaging on admission further concerning for pneumonia and patient was started on ceftriaxone and azithromycin. Despite implemented therapy, imaging showed worsening airspace disease. Pharmacy has now been consulted for cefepime dosing. ? ?-Per MD note: Antibiotic was changed to cefepime 4/7 due to worsening leukocytosis, add Pseudomonas coverage given his history of alcoholism and just recently in recovery ? ?Plan: ? ?Day 3- Cefepime 2 g IV q8h ? ? ?Height: 6\' 3"  (190.5 cm) ?Weight: 86.6 kg (190 lb 14.7 oz) ?IBW/kg (Calculated) : 84.5 ? ?Temp (24hrs), Avg:98.9 ?F (37.2 ?C), Min:98.6 ?F (37 ?C), Max:99.5 ?F (37.5 ?C) ? ?Recent Labs  ?Lab 01/17/22 ?1506 01/17/22 ?1932 01/19/22 ?0449 01/20/22 ?0321 01/21/22 ?[id number] 01/22/22 ?[id number] 01/23/22 ?[id number]  ?WBC  --   --  9.7 11.6* 13.1* 12.5* 13.9*  ?CREATININE 2.32*   < > 1.20 1.04 0.94 0.85 0.88  ?LATICACIDVEN 4.7*  --  0.8  --   --   --   --   ? < > = values in this interval not displayed.  ? ?  ?Estimated Creatinine Clearance: 134.7 mL/min (by C-G formula based on SCr of 0.88 mg/dL).   ? ?Allergies  ?Allergen Reactions  ? Lisinopril Swelling  ?  Angioedema ?  ? Shellfish Allergy Swelling  ? ? ?Antimicrobials this admission: ?Azithromycin 4/3 >> 4/5 ?Ceftriaxone 4/3 >> 4/6 ?Cefepime 4/7 >>  ? ?Dose adjustments this admission: ?N/A ? ?Microbiology results: ?4/3 BCx: NGTD ?4/3 Expanded respiratory panel: (-) ?4/3 SARS-CoV-2 / influenza A / B: (-) ?4/3 MRSA PCR: (-) ?4/4 Pleural fluid: NGTD ? ?Thank you for allowing pharmacy to be a part of this patient?s care. ? ?Deshia Vanderhoof A ?01/23/2022 9:56 AM ? ?

## 2022-01-23 NOTE — Progress Notes (Signed)
PHARMACY CONSULT NOTE ? ?Pharmacy Consult for Electrolyte Monitoring and Replacement  ? ?Recent Labs: ?Potassium (mmol/L)  ?Date Value  ?01/23/2022 3.9  ? ?Magnesium (mg/dL)  ?Date Value  ?01/23/2022 1.8  ? ?Calcium (mg/dL)  ?Date Value  ?01/23/2022 8.7 (L)  ? ?Albumin (g/dL)  ?Date Value  ?01/17/2022 3.3 (L)  ? ?Phosphorus (mg/dL)  ?Date Value  ?01/20/2022 3.6  ? ?Sodium (mmol/L)  ?Date Value  ?01/23/2022 131 (L)  ? ?Assessment: ?71 YOM presenting with dyspnea, fatigue, and weight gain. PMH CHF EF 30%, alcohol dependence, multiple episodes alcoholic pancreatitis, cigarette smoking, GERD, depression. In ICU with cardiogenic shock due to acute decompensated systolic heart failure.  ? ?Medications:  ?Milrinone 0.125 mcg/kg/min (weaning)>> stopped 4/8 ?PO Lasix 40 mg BID ?Digoxin 0.125mg  po daily ?Spironolactone 12.5mg  daily ? ?Goal of Therapy:  ?K >= 4 ?Mg >= 2 ?All other electrolytes within normal limits ? ?Plan:  ?K 3.9  -will order KCL 20 meq po x1  (on lasix 40 BID) ?Mag 1.8  -will order Magnesium sulfate 2 gm IV x 1 (on digoxin/lasix) ?Follow-up electrolytes with AM labs tomorrow ? ?Cecilia Vancleve A ?01/23/2022 ?9:46 AM ? ?

## 2022-01-23 NOTE — Progress Notes (Signed)
?Progress Note ? ? ?Patient: Mario Beck EXN:[id number] DOB: Sep 23, 1981 DOA: 01/17/2022     6 ?DOS: the patient was seen and examined on 01/23/2022 ?  ?Brief hospital course: ?HPI taken from ICU notes: ?"41 yo male with a history of cardiomyopathy presented to the [facility] on 4/3 complaining of 3-4 days of dyspnea, fatigue and weight gain. He says that he has been compliant with his medications which include furosemide, metoprolol, nexium, potassium and sertraline.  However he says that the lasix has not been working for him in the last week or so and his urine output has become very minimal and dark.  He has noted increasing dyspnea when he climbs a flight of stairs in his apartment and yesterday passed out twice when he was walking across the parking lot.   ?He was admitted to [facility] for alcohol detox on 3/21, treated with lithium, and discharged on 3/24.  He says that he has been compliant with his medications. ?He was supposed to see Dr. Welton Flakes with cardiology after his last discharge for alcoholic pancreatitis, acute decompensated CHF in 06/2021, but he missed his appointments and has not followed up.  ?He smokes 1 pack of cigarettes daily.  He quit drinking on 3/21 and has not had alcohol since then.  He denies illicit drug use." ? ?Patient was admitted to ICU with cardiogenic shock. ?Cardiology consulted.   ?Plan for L/R heart cath once euvolemic. ?Currently on milrinone and IV Lasix.   ?Weaned off Levophed.  ? ?Transferred to [facility] service 4/5. ?Found to have acute PE on the right with probable pulmonary infarct.  Started on heparin drip. ? ?Assessment and Plan: ?* Acute on chronic HFrEF (heart failure with reduced ejection fraction) (HCC) ?Admitted to ICU with cardiogenic shock 4/3.  Presented with worsening dyspnea and orthopnea, weight gain, elevated BNP 2253, and bilateral pleural effusions. ?Most recent echo with EF less than 20% with moderate to severe MR. ?Right and left cardiac cath on 4/7 without  significant CAD, findings consistent with nonischemic cardiomyopathy (see report, or conclusion below). ?-- Management per cardiology ?-- Taken off milrinone 4/8 ?-- Diuresed with IV Lasix 40 mg BID ?-- Transitioned to PO Lasix 40 mg daily ?-- Started on low-dose Coreg 3.125 mg BID ?-- Spironolactone, increased to 25 mg ?-- Weaned off Levophed ?-- Strict I/O's, Daily weights ? ?Net IO Since Admission: -10,779.68 mL [01/21/22 1459] ? ? ? ?Cardiogenic shock (HCC) ?As outlined above ? ?Acute pulmonary embolism (HCC) ?Pt had severe persistent R pleuritic chest pain, tachycardia.  D-dimer >9.  CTA on 4/5 revealed "Acute pulmonary embolism in the multiple segmental and subsegmental branches in the right lung. There is small thrombus burden. There are no signs of acute right ventricular strain..." possible pulmonary infarction. ?-- Transitioned off heparin drip to Eliquis after cath 4/7 ?-- Continue Eliquis ? ?Pleuritic chest pain ?Seems musculoskeletal.  Initially low suspicion for PE, patient had no hypoxia.  He later became hypoxic requiring 3 L/min and noted to have persistent tachycardia with worsening pleuritic chest pain. ?D-dimer is elevated above 9. ?Subsequent CTA chest confirmed right-sided PE. ?--Now on anticoagulation as outlined ?-- Supportive care with analgesia as needed ?-- Incentive spirometer ? ?Pleural effusion on left ?Status post bedside thoracentesis on 4/4 with 850 cc removed.  Pleural fluid culture negative negative for malignancy. ? ?CAP (community acquired pneumonia) ?Started on Rocephin and Zithromax. ?Antibiotic was changed to cefepime due to worsening leukocytosis, add Pseudomonas coverage given his history of alcoholism and just  recently in recovery. ?--Continue cefepime. ?--Supportive care. ? ?AKI (acute kidney injury) (HCC) ?POA, resolved.  Likely prerenal etiology given increased home dose Lasix in days prior to admission. ?--Monitor BMP ? ?Hyperkalemia ?Resolved.  POA with potassium 6.1  in the setting of worsening renal function and potassium supplementation at home with his diuretic.  No EKG changes.   ?K today 4.4 ?-- Monitor BMP ? ?Syncope ?On admission, patient reported feeling dizzy and someone was able to hold him before falling down.  No chest pain.  Recently increased Lasix.  Patient has very low EF. ?Likely due to poor cardiac output and hypotension. ?-- Monitor orthostatic vitals ?-- Further evaluation pending clinical course ? ?Lactic acidosis ?POA, resolved.   ?Most likely secondary to decreased perfusio in the setting of cardiogenic shock n.  No leukocytosis or fever.  CT chest was concerning for multifocal pneumonia, started on empiric antibiotics on admission. ? ? ?HTN (hypertension), malignant ?Admitted with cardiogenic shock, required Levophed. ?Home regimen metoprolol 100 mg daily, isosorbide, hydralazine 25 mg 3 times a day and Lasix 40 mg twice daily at home. ?--Currently on milrinone ?--Hold antihypertensives ?-- Cardiology following ? ? ?Alcohol use disorder, moderate, dependence (HCC) ?Last drink 8 days prior to admission.  He was admitted in [facility] for alcohol detox on 3/21, treated with Librium, discharged 3/24.  Denies any alcohol consumption since.  No signs of withdrawal. ?--Stop CIWA ?-- Monitor ? ?Tobacco abuse counseling ?Counseled regarding importance of cessation.  Nicotine patch ordered. ? ? ? ? ?  ? ?Subjective: Pt seen this AM in ICU.  Hopes to go home soon.  Still has some pleuritic chest discomfort at times, overall improved.  Reports coughed up small amount of blood earlier this AM.  No other acute complaints. ? ?Physical Exam: ?Vitals:  ? 01/23/22 0840 01/23/22 1000 01/23/22 1239 01/23/22 1437  ?BP:  112/70 104/68 117/82  ?Pulse: (!) 103 100  (!) 106  ?Resp: 20 18 14 16   ?Temp: 99 ?F (37.2 ?C)   99 ?F (37.2 ?C)  ?TempSrc: Oral     ?SpO2: 99% 94%  90%  ?Weight:      ?Height:      ? ?General exam: awake, alert, no acute distress ?HEENT: moist mucus  membranes, hearing grossly normal  ?Respiratory system: normal respiratory effort. ?Cardiovascular system: normal S1/S2, RRR, no pedal edema.   ?Central nervous system: A&O x3. no gross focal neurologic deficits, normal speech ?Extremities: moves all, no edema, normal tone ?Psychiatry: normal mood, congruent affect, judgement and insight appear normal ? ? ?Data Reviewed: ? ?Labs reviewed notable for sodium 131, chloride 95, glucose 183, calcium 8.7, white count 13.9, hemoglobin 8.5 ? ?Family Communication: none at bedside ? ?Disposition: ?Status is: Inpatient ?Remains inpatient appropriate because: Severity of illness, medication adjustments per cardiology are still underway.  Discharge pending clearance from cardiology ? ? Planned Discharge Destination: Home ? ? ? ?Time spent: 35 minutes including times at bedside and in coordination of care with consultants and ancillary staff. ? ?Author: ? , DO ?01/23/2022 3:33 PM ? ?For on call review [email or web address].  ?

## 2022-01-23 NOTE — Progress Notes (Signed)
? ?Progress Note ? ?Patient Name: Mario Beck ?Date of Encounter: 01/23/2022 ? ?[facility] Cardiologist:   ? ?Subjective  ? ?41 year old gentleman with history of a nonischemic cardiomyopathy likely due to alcohol abuse.  He was admitted with congestive heart failure.  He has diuresed 12.8 liters so far during this admission .  ? ?Coreg 3.125 bid was started yesterday  ?HR is 103 ( better )  ?BP remains stable  ?Is off milrinone ? ?Breathing is ok  ?Ok to transfer to tele  ? ?Inpatient Medications  ?  ?Scheduled Meds: ? apixaban  10 mg Oral BID  ? Followed by  ? [START ON 01/28/2022] apixaban  5 mg Oral BID  ? carvedilol  3.125 mg Oral BID WC  ? Chlorhexidine Gluconate Cloth  6 each Topical Daily  ? digoxin  0.125 mg Oral Daily  ? folic acid  1 mg Oral Daily  ? furosemide  40 mg Oral BID  ? lidocaine  1 patch Transdermal Q24H  ? losartan  12.5 mg Oral Daily  ? multivitamin with minerals  1 tablet Oral Daily  ? nicotine  21 mg Transdermal Daily  ? potassium chloride  20 mEq Oral Once  ? sodium chloride flush  3 mL Intravenous Q12H  ? sodium chloride flush  3 mL Intravenous Q12H  ? spironolactone  12.5 mg Oral Daily  ? thiamine  100 mg Oral Daily  ? ?Continuous Infusions: ? sodium chloride Stopped (01/22/22 2240)  ? sodium chloride    ? ceFEPime (MAXIPIME) IV 2 g (01/23/22 0413)  ? magnesium sulfate bolus IVPB    ? ?PRN Meds: ?sodium chloride, acetaminophen **OR** acetaminophen, guaiFENesin, hydrOXYzine, ondansetron (ZOFRAN) IV, oxyCODONE, polyethylene glycol, sodium chloride flush  ? ?Vital Signs  ?  ?Vitals:  ? 01/23/22 0500 01/23/22 0730 01/23/22 0800 01/23/22 0840  ?BP:   101/64   ?Pulse:  (!) 105 (!) 103 (!) 103  ?Resp:  (!) 27 (!) 21 20  ?Temp:    99 ?F (37.2 ?C)  ?TempSrc:    Oral  ?SpO2:  92% 93% 99%  ?Weight: 86.6 kg     ?Height:      ? ? ?Intake/Output Summary (Last 24 hours) at 01/23/2022 1000 ?Last data filed at 01/22/2022 2300 ?Gross per 24 hour  ?Intake 766.4 ml  ?Output 1200 ml  ?Net -433.6 ml  ? ? ? ?   01/23/2022  ?  5:00 AM 01/21/2022  ?  4:58 AM 01/20/2022  ?  5:00 AM  ?Last 3 Weights  ?Weight (lbs) 190 lb 14.7 oz 196 lb 3.4 oz 200 lb 2.8 oz  ?Weight (kg) 86.6 kg 89 kg 90.8 kg  ?   ? ?Telemetry  ?  ?Sinus rhythm at 100  ? ?ECG  ?  ? - Personally Reviewed ? ?Physical Exam  ?  ?Physical Exam: ?Blood pressure 101/64, pulse (!) 103, temperature 99 ?F (37.2 ?C), temperature source Oral, resp. rate 20, height 6\' 3"  (1.905 m), weight 86.6 kg, SpO2 99 %. ? ?GEN:  young man,  NAD  ?HEENT: Normal ?NECK: No JVD; No carotid bruits ?LYMPHATICS: No lymphadenopathy ?CARDIAC: RRR   ?RESPIRATORY:  Clear to auscultation without rales, wheezing or rhonchi  ?ABDOMEN: Soft, non-tender, non-distended ?MUSCULOSKELETAL:  No edema; No deformity  ?SKIN: Warm and dry ?NEUROLOGIC:  Alert and oriented x 3 ? ? ?Labs  ?  ?High Sensitivity Troponin:   ?Recent Labs  ?Lab 01/17/22 ?1932 01/17/22 ?2252 01/18/22 ?1610 01/18/22 ?0755  ?TROPONINIHS 28* 29* 32*  39*  ? ?   ?Chemistry ?Recent Labs  ?Lab 01/17/22 ?1932 01/18/22 ?7371 01/21/22 ?0626 01/22/22 ?9485 01/23/22 ?4627  ?NA 136   < > 135 135 131*  ?K 6.0*   < > 4.4 4.0 3.9  ?CL 100   < > 96* 97* 95*  ?CO2 18*   < > 28 30 29   ?GLUCOSE 124*   < > 174* 135* 183*  ?BUN 30*   < > 13 11 12   ?CREATININE 2.40*   < > 0.94 0.85 0.88  ?CALCIUM 8.5*   < > 9.1 8.7* 8.7*  ?MG  --    < > 2.1 2.0 1.8  ?PROT 7.3  --   --   --   --   ?ALBUMIN 3.3*  --   --   --   --   ?AST 94*  --   --   --   --   ?ALT 42  --   --   --   --   ?ALKPHOS 314*  --   --   --   --   ?BILITOT 1.8*  --   --   --   --   ?GFRNONAA 34*   < > >60 >60 >60  ?ANIONGAP 18*   < > 11 8 7   ? < > = values in this interval not displayed.  ? ?  ?Lipids No results for input(s): CHOL, TRIG, HDL, LABVLDL, LDLCALC, CHOLHDL in the last 168 hours.  ?Hematology ?Recent Labs  ?Lab 01/21/22 ? 01/22/22 ?03/23/22 01/23/22 ?03/24/22  ?WBC 13.1* 12.5* 13.9*  ?RBC 3.55* 3.15* 3.16*  ?HGB 9.7* 8.6* 8.5*  ?HCT 29.3* 25.7* 25.7*  ?MCV 82.5 81.6 81.3  ?MCH 27.3 27.3 26.9  ?MCHC  33.1 33.5 33.1  ?RDW 17.9* 18.0* 18.4*  ?PLT 289 279 322  ? ? ?Thyroid No results for input(s): TSH, FREET4 in the last 168 hours.  ?BNP ?Recent Labs  ?Lab 01/17/22 ?0925  ?BNP 2,253.2*  ? ?  ?DDimer  ?Recent Labs  ?Lab 01/19/22 ?1542  ?DDIMER 9.39*  ? ?  ? ?Radiology  ?  ?CARDIAC CATHETERIZATION ? ?Result Date: 01/21/2022 ?  Mid LAD lesion is 40% stenosed.   There is severe left ventricular systolic dysfunction.   LV end diastolic pressure is mildly elevated.   The left ventricular ejection fraction is less than 25% by visual estimate. 1.  Moderate one-vessel coronary artery disease with 40% stenosis in the mid LAD.  No evidence of obstructive disease. 2.  Severely reduced LV systolic function with an EF of 15%. 3.  Right heart catheterization showed mildly elevated filling pressures, mild pulmonary hypertension and normal cardiac output. Recommendations: The patient has nonischemic cardiomyopathy.  We will switch to oral furosemide and try to add heart failure medications.  Continue milrinone likely for another day.   ? ?Cardiac Studies  ? ?  ? ?Patient Profile  ?   ?41 y.o. male   ? ?Assessment & Plan  ?  ?1.  Acute on chronic congestive heart failure: He seems to be doing better.  We will stop milrinone today.   Tolerating the coreg well  ?Cont coreg 3.125 BID - anticipate increasing in the next day or so ?I have increase spironolactone to 25 mg a day  ? ? ? ?2.  Small pulmonary embolus.  Continue anticoagulation.  He still has a cough and some pleuritic chest pain. ?   ? ?For questions or updates, please contact [facility] ?Please consult [email or web address] for  contact info under  ? ?  ?   ?Signed, ?Kristeen Miss, MD  ?01/23/2022, 10:00 AM   ? ?

## 2022-01-24 ENCOUNTER — Ambulatory Visit: Payer: Self-pay | Admitting: Family

## 2022-01-24 ENCOUNTER — Telehealth (HOSPITAL_COMMUNITY): Payer: Self-pay | Admitting: Licensed Clinical Social Worker

## 2022-01-24 ENCOUNTER — Inpatient Hospital Stay: Payer: Medicaid Other

## 2022-01-24 DIAGNOSIS — D72829 Elevated white blood cell count, unspecified: Secondary | ICD-10-CM

## 2022-01-24 DIAGNOSIS — J189 Pneumonia, unspecified organism: Secondary | ICD-10-CM | POA: Diagnosis not present

## 2022-01-24 LAB — BASIC METABOLIC PANEL
Anion gap: 10 (ref 5–15)
BUN: 16 mg/dL (ref 6–20)
CO2: 28 mmol/L (ref 22–32)
Calcium: 9.1 mg/dL (ref 8.9–10.3)
Chloride: 99 mmol/L (ref 98–111)
Creatinine, Ser: 1.03 mg/dL (ref 0.61–1.24)
GFR, Estimated: >60 mL/min (ref >60)
Glucose, Bld: 141 mg/dL — ABNORMAL HIGH (ref 70–99)
Potassium: 4.3 mmol/L (ref 3.5–5.1)
Sodium: 137 mmol/L (ref 135–145)

## 2022-01-24 LAB — CBC
HCT: 26.1 % — ABNORMAL LOW (ref 39.0–52.0)
Hemoglobin: 8.7 g/dL — ABNORMAL LOW (ref 13.0–17.0)
MCH: 27.2 pg (ref 26.0–34.0)
MCHC: 33.3 g/dL (ref 30.0–36.0)
MCV: 81.6 fL (ref 80.0–100.0)
Platelets: 373 10*3/uL (ref 150–400)
RBC: 3.2 MIL/uL — ABNORMAL LOW (ref 4.22–5.81)
RDW: 18.2 % — ABNORMAL HIGH (ref 11.5–15.5)
WBC: 15.5 10*3/uL — ABNORMAL HIGH (ref 4.0–10.5)
nRBC: 0.2 % (ref 0.0–0.2)

## 2022-01-24 LAB — PROCALCITONIN: Procalcitonin: 0.36 ng/mL

## 2022-01-24 LAB — MAGNESIUM: Magnesium: 2.2 mg/dL (ref 1.7–2.4)

## 2022-01-24 MED ORDER — DAPAGLIFLOZIN PROPANEDIOL 10 MG PO TABS
10.0000 mg | ORAL_TABLET | Freq: Every day | ORAL | Status: DC
  Administered 2022-01-24 – 2022-01-28 (×5): 10 mg via ORAL
  Filled 2022-01-24 (×5): qty 1

## 2022-01-24 NOTE — Plan of Care (Signed)
  Problem: Education: Goal: Ability to demonstrate management of disease process will improve Outcome: Progressing Goal: Ability to verbalize understanding of medication therapies will improve Outcome: Progressing Goal: Individualized Educational Video(s) Outcome: Progressing   Problem: Activity: Goal: Capacity to carry out activities will improve Outcome: Progressing   Problem: Cardiac: Goal: Ability to achieve and maintain adequate cardiopulmonary perfusion will improve Outcome: Progressing   Problem: Education: Goal: Knowledge of General Education information will improve Description Including pain rating scale, medication(s)/side effects and non-pharmacologic comfort measures Outcome: Progressing   Problem: Health Behavior/Discharge Planning: Goal: Ability to manage health-related needs will improve Outcome: Progressing   Problem: Clinical Measurements: Goal: Ability to maintain clinical measurements within normal limits will improve Outcome: Progressing Goal: Will remain free from infection Outcome: Progressing Goal: Diagnostic test results will improve Outcome: Progressing Goal: Respiratory complications will improve Outcome: Progressing Goal: Cardiovascular complication will be avoided Outcome: Progressing   Problem: Activity: Goal: Risk for activity intolerance will decrease Outcome: Progressing   Problem: Nutrition: Goal: Adequate nutrition will be maintained Outcome: Progressing   Problem: Coping: Goal: Level of anxiety will decrease Outcome: Progressing   Problem: Pain Managment: Goal: General experience of comfort will improve Outcome: Progressing   

## 2022-01-24 NOTE — Consult Note (Addendum)
? ? ? ?Pulmonary Medicine  ? ? ? ? ? ? ? ? ?Date: 01/24/2022,   ?MRN# [id number] Mario Beck [birth year] ? ? ?  ?AdmissionWeight: 88.5 kg                 ?CurrentWeight: 86.6 kg ? ? ? ? ? ?CHIEF COMPLAINT:  ? ?Dyspnea, pneumonia and right pleural effusion. ? ? ?HISTORY OF PRESENT ILLNESS  ? ? ?This is a 41 yr old male, hx of alcoholism, s/p recent in house detox at [facility]. Also has poor heart function, hypertensin, asthma/copd, still smokes, depression. Came to [place] with shortness of breath, fatique and weight loss.  Noted to be in cardiogenic shock. Was admitted to the ICU.  Weaned off levophed. On iv lasix and milrinone. Last echo the EF less than 20% with moderate to severe MR. Right and left cardiac cath on 4/7 showed no significant CAD, consistent with nonischemic cardiomyopathy.  ? ?On 4/5 diagnosed with pulmonary embolism, no right heart strain.  ?Left pleural effusion. Was treated initially on rocephin and zithromax. Wbc was going up hence switched to cefepime ? ?Asked to see regarding chest ct findings  ? ?PAST MEDICAL HISTORY  ? ?Past Medical History:  ?Diagnosis Date  ? Alcohol abuse   ? Asthma   ? CHF (congestive heart failure) ([place])   ? Depression   ? Hypertension   ? ? ? ?SURGICAL HISTORY  ? ?Past Surgical History:  ?Procedure Laterality Date  ? FRACTURE SURGERY    ? Left leg as a kid  ? RIGHT/LEFT HEART CATH AND CORONARY ANGIOGRAPHY N/A 01/21/2022  ? Procedure: RIGHT/LEFT HEART CATH AND CORONARY ANGIOGRAPHY;  Surgeon: Wellington Hampshire, MD;  Location: [facility];  Service: Cardiovascular;  Laterality: N/A;  ? ? ? ?FAMILY HISTORY  ? ?Family History  ?Problem Relation Age of Onset  ? Colon cancer Neg Hx   ? Esophageal cancer Neg Hx   ? ? ? ?SOCIAL HISTORY  ? ?Social History  ? ?Tobacco Use  ? Smoking status: Every Day  ?  Packs/day: 1.00  ?  Types: Cigarettes  ? Smokeless tobacco: Current  ?  Types: Snuff  ? Tobacco comments:  ?  Does pouches as welll   ?Vaping Use  ? Vaping Use: Never  used  ?Substance Use Topics  ? Alcohol use: Not Currently  ?  Alcohol/week: 7.0 standard drinks  ?  Types: 7 Cans of beer per week  ? Drug use: No  ? ? ? ?MEDICATIONS  ? ? ?Home Medication:  ?  ?Current Medication: ? ?Current Facility-Administered Medications:  ?  0.9 %  sodium chloride infusion, 250 mL, Intravenous, Continuous, [name], [name], MD, Last Rate: 10 mL/hr at 01/23/22 1124, 250 mL at 01/23/22 1124 ?  acetaminophen (TYLENOL) tablet 650 mg, 650 mg, Oral, Q6H PRN, 650 mg at 01/23/22 1825 **OR** acetaminophen (TYLENOL) suppository 650 mg, 650 mg, Rectal, Q6H PRN, Wellington Hampshire, MD ?  apixaban (ELIQUIS) tablet 10 mg, 10 mg, Oral, BID, 10 mg at 01/24/22 0824 **FOLLOWED BY** [START ON 01/28/2022] apixaban (ELIQUIS) tablet 5 mg, 5 mg, Oral, BID, Nicole Kindred A, DO ?  carvedilol (COREG) tablet 3.125 mg, 3.125 mg, Oral, BID WC, [name], Wonda Cheng, MD, 3.125 mg at 01/24/22 0825 ?  ceFEPIme (MAXIPIME) 2 g in sodium chloride 0.9 % 100 mL IVPB, 2 g, Intravenous, Q8H, [name], [name], MD, Last Rate: 200 mL/hr at 01/24/22 1116, 2 g at 01/24/22 1116 ?  dapagliflozin propanediol (  FARXIGA) tablet 10 mg, 10 mg, Oral, Daily, [name], [name], PA-C ?  digoxin (LANOXIN) tablet 0.125 mg, 0.125 mg, Oral, Daily, [name], [name], MD, 0.125 mg at [id number] ?  folic acid (FOLVITE) tablet 1 mg, 1 mg, Oral, Daily, [name], [name], MD, 1 mg at 01/24/22 0825 ?  furosemide (LASIX) tablet 40 mg, 40 mg, Oral, Daily, [name], Wonda Cheng, MD, 40 mg at 01/24/22 [id number] ?  guaiFENesin (ROBITUSSIN) 100 MG/5ML liquid 5 mL, 5 mL, Oral, Q4H PRN, Nicole Kindred A, DO, 5 mL at 01/23/22 2022 ?  hydrOXYzine (ATARAX) tablet 25 mg, 25 mg, Oral, TID PRN, Wellington Hampshire, MD, 25 mg at 01/18/22 2033 ?  lidocaine (LIDODERM) 5 % 1 patch, 1 patch, Transdermal, Q24H, [name], [name], MD, 1 patch at 01/24/22 1116 ?  losartan (COZAAR) tablet 12.5 mg, 12.5 mg, Oral, Daily, [name], [name], MD, 12.5 mg at 01/24/22 0825 ?  multivitamin with minerals  tablet 1 tablet, 1 tablet, Oral, Daily, Kathlyn Sacramento A, MD, 1 tablet at 01/24/22 0825 ?  nicotine (NICODERM CQ - dosed in mg/24 hours) patch 21 mg, 21 mg, Transdermal, Daily, Kathlyn Sacramento A, MD, 21 mg at 01/24/22 0830 ?  ondansetron (ZOFRAN) injection 4 mg, 4 mg, Intravenous, Q6H PRN, Wellington Hampshire, MD, 4 mg at 01/17/22 1705 ?  oxyCODONE (Oxy IR/ROXICODONE) immediate release tablet 5-10 mg, 5-10 mg, Oral, Q4H PRN, Wellington Hampshire, MD, 10 mg at 01/24/22 1116 ?  polyethylene glycol (MIRALAX / GLYCOLAX) packet 17 g, 17 g, Oral, Daily PRN, Fletcher Anon, [name], MD ?  sodium chloride flush (NS) 0.9 % injection 3 mL, 3 mL, Intravenous, Q12H, [name], [name], MD, 3 mL at 01/22/22 2230 ?  spironolactone (ALDACTONE) tablet 25 mg, 25 mg, Oral, Daily, [name], Wonda Cheng, MD, 25 mg at 01/24/22 [id number] ?  thiamine tablet 100 mg, 100 mg, Oral, Daily, Kathlyn Sacramento A, MD, 100 mg at 01/24/22 0825 ? ? ? ?ALLERGIES  ? ?Lisinopril and Shellfish allergy ? ? ? ? ?REVIEW OF SYSTEMS  ? ? ?Review of Systems: ? ?Gen:  Denies  fever, sweats, chills weigh loss  ?HEENT: Denies blurred vision, double vision, ear pain, eye pain, hearing loss, nose bleeds, sore throat ?Cardiac:  No dizziness, chest pain or heaviness, chest tightness,edema ?Resp:   Denies cough or sputum porduction, shortness of breath,wheezing, hemoptysis,  ?Gi: Denies swallowing difficulty, stomach pain, nausea or vomiting, diarrhea, constipation, bowel incontinence ?Gu:  Denies bladder incontinence, burning urine ?Ext:   Denies Joint pain, stiffness or swelling ?Skin: Denies  skin rash, easy bruising or bleeding or hives ?Endoc:  Denies polyuria, polydipsia , polyphagia or weight change ?Psych:   Denies depression, insomnia or hallucinations  ? ?Other:  All other systems negative ? ? ?VS: BP 118/74 (BP Location: Left Arm)   Pulse (!) 109   Temp 98.6 ?F (37 ?C)   Resp 18   Ht 6\' 3"  (1.905 m)   Wt 86.6 kg   SpO2 90%   BMI 23.86 kg/m?   ? ? ? ?PHYSICAL EXAM   ? ? ?GENERAL:NAD, no fevers, chills, no weakness no fatigue ?HEAD: Normocephalic, atraumatic.  ?EYES: Pupils equal, round, reactive to light. Extraocular muscles intact. No scleral icterus.  ?MOUTH: Moist mucosal membrane. Dentition intact. No abscess noted.  ?EAR, NOSE, THROAT: Clear without exudates. No external lesions.  ?NECK: Supple. No thyromegaly. No nodules. No JVD.  ?PULMONARY: Diffuse coarse rhonchi right sided +wheezes ?CARDIOVASCULAR: S1 and S2. Regular rate and rhythm. No  murmurs, rubs, or gallops. No edema. Pedal pulses 2+ bilaterally.  ?GASTROINTESTINAL: Soft, nontender, nondistended. No masses. Positive bowel sounds. No hepatosplenomegaly.  ?MUSCULOSKELETAL: No swelling, clubbing, or edema. Range of motion full in all extremities.  ?NEUROLOGIC: Cranial nerves II through XII are intact. No gross focal neurological deficits. Sensation intact. Reflexes intact.  ?SKIN: No ulceration, lesions, rashes, or cyanosis. Skin warm and dry. Turgor intact.  ?PSYCHIATRIC: Mood, affect within normal limits. The patient is awake, alert and oriented x 3. Insight, judgment intact.  ? ? ?  ? ?IMAGING  ? ? ?DG Chest 2 View ? ?Result Date: 01/17/2022 ?CLINICAL DATA:  41 year old male with history of shortness of breath and chest pain. EXAM: CHEST - 2 VIEW COMPARISON:  Chest x-ray 12/08/2021. FINDINGS: New moderate left pleural effusion. Atelectasis and/or consolidation in the base of the left lung. Right lung is clear. No right pleural effusion. No pneumothorax. No evidence of pulmonary edema. Heart size is normal. Upper mediastinal contours are within normal limits. IMPRESSION: 1. New moderate left pleural effusion with atelectasis and/or consolidation in the left lung base. Electronically Signed   By: Vinnie Langton M.D.   On: 01/17/2022 09:43  ? ?CT Chest Wo Contrast ? ?Result Date: 01/17/2022 ?CLINICAL DATA:  Abnormal chest x-ray EXAM: CT CHEST WITHOUT CONTRAST TECHNIQUE: Multidetector CT imaging of the chest was  performed following the standard protocol without IV contrast. RADIATION DOSE REDUCTION: This exam was performed according to the departmental dose-optimization program which includes automated exposure control, adjustmen

## 2022-01-24 NOTE — Discharge Instructions (Signed)
Information on my medicine - ELIQUIS? (apixaban) ? ?This medication education was reviewed with me or my healthcare representative as part of my discharge preparation.  ? ?Why was Eliquis? prescribed for you? ?Eliquis? was prescribed to treat blood clots that may have been found in the veins of your legs (deep vein thrombosis) or in your lungs (pulmonary embolism) and to reduce the risk of them occurring again. ? ?What do You need to know about Eliquis? ? ?The starting dose is 10 mg (two 5 mg tablets) taken TWICE daily for the FIRST SEVEN (7) DAYS, then on (enter date)  01/28/21  the dose is reduced to ONE 5 mg tablet taken TWICE daily.  Eliquis? may be taken with or without food.  ? ?Try to take the dose about the same time in the morning and in the evening. If you have difficulty swallowing the tablet whole please discuss with your pharmacist how to take the medication safely. ? ?Take Eliquis? exactly as prescribed and DO NOT stop taking Eliquis? without talking to the doctor who prescribed the medication.  Stopping may increase your risk of developing a new blood clot.  Refill your prescription before you run out. ? ?After discharge, you should have regular check-up appointments with your healthcare provider that is prescribing your Eliquis?. ?   ?What do you do if you miss a dose? ?If a dose of ELIQUIS? is not taken at the scheduled time, take it as soon as possible on the same day and twice-daily administration should be resumed. The dose should not be doubled to make up for a missed dose. ? ?Important Safety Information ?A possible side effect of Eliquis? is bleeding. You should call your healthcare provider right away if you experience any of the following: ?Bleeding from an injury or your nose that does not stop. ?Unusual colored urine (red or dark brown) or unusual colored stools (red or black). ?Unusual bruising for unknown reasons. ?A serious fall or if you hit your head (even if there is no  bleeding). ? ?Some medicines may interact with Eliquis? and might increase your risk of bleeding or clotting while on Eliquis?Marland Kitchen To help avoid this, consult your healthcare provider or pharmacist prior to using any new prescription or non-prescription medications, including herbals, vitamins, non-steroidal anti-inflammatory drugs (NSAIDs) and supplements. ? ?This website has more information on Eliquis? (apixaban): http://www.eliquis.com/eliquis/home  ?

## 2022-01-24 NOTE — Assessment & Plan Note (Addendum)
Repeat CXR 4/10 due to rising WBC despite Cefepime - it showed worsening multilobar PNA with MODERATE RIGHT parapneumonic effusion. ?IR consulted for thoracentesis, done today.  Effusion is severely loculated, only 200 cc removed ?--Follow pleural fluid labs & culture ?--Pulmonology consulted (Dr. Raul Del) ?

## 2022-01-24 NOTE — Telephone Encounter (Signed)
The therapist attempts to reach [facility] in response to a referral from [facility] apparently for him to attend [facility]. ? ?The therapist leaves a HIPAA-compliant voicemail with his callback number which is [phone] ? ?Myrna Blazer, MA, LCSW, [facility], LCAS ?01/24/2022 ?

## 2022-01-24 NOTE — Progress Notes (Signed)
?Progress Note ? ? ?Patient: Mario Beck XVQ:[id number] DOB: Aug 06, 1981 DOA: 01/17/2022     7 ?DOS: the patient was seen and examined on 01/24/2022 ?  ?Brief hospital course: ?HPI taken from ICU notes: ?"41 yo male with a history of cardiomyopathy presented to the [facility] ER on 4/3 complaining of 3-4 days of dyspnea, fatigue and weight gain. He says that he has been compliant with his medications which include furosemide, metoprolol, nexium, potassium and sertraline.  However he says that the lasix has not been working for him in the last week or so and his urine output has become very minimal and dark.  He has noted increasing dyspnea when he climbs a flight of stairs in his apartment and yesterday passed out twice when he was walking across the parking lot.   ?He was admitted to [facility] for alcohol detox on 3/21, treated with lithium, and discharged on 3/24.  He says that he has been compliant with his medications. ?He was supposed to see Dr. Welton Flakes with cardiology after his last discharge for alcoholic pancreatitis, acute decompensated CHF in 06/2021, but he missed his appointments and has not followed up.  ?He smokes 1 pack of cigarettes daily.  He quit drinking on 3/21 and has not had alcohol since then.  He denies illicit drug use." ? ?Patient was admitted to ICU with cardiogenic shock. ?Cardiology consulted.   ?Plan for L/R heart cath once euvolemic. ?Currently on milrinone and IV Lasix.   ?Weaned off Levophed.  ? ?Transferred to [facility] service 4/5. ? ?Found to have acute PE on the right with probable pulmonary infarct.  Started on heparin drip, then transitioned to Eliquis. ? ?4/10: despite several days Cefepime, WBC increasing.  Repeat CXR with worsened R-sided infiltrates and parapneumonia effusion.   ? ?Assessment and Plan: ?* Acute on chronic HFrEF (heart failure with reduced ejection fraction) (HCC) ?Admitted to ICU with cardiogenic shock 4/3.  Presented with worsening dyspnea and orthopnea, weight  gain, elevated BNP 2253, and bilateral pleural effusions. ?Most recent echo with EF less than 20% with moderate to severe MR. ?Right and left cardiac cath on 4/7 without significant CAD, findings consistent with nonischemic cardiomyopathy (see report, or conclusion below). ?-- Management per cardiology ?-- Taken off milrinone 4/8 ?-- Diuresed with IV Lasix 40 mg BID ?-- Transitioned to PO Lasix 40 mg daily ?-- Started on low-dose Coreg 3.125 mg BID ?-- Spironolactone, increased to 25 mg ?-- Started on Digoxin ?-- Weaned off Levophed ?-- Strict I/O's, Daily weights ? ?Net IO Since Admission: -12,918.88 mL [01/24/22 1628] ? ? ? ?Cardiogenic shock (HCC) ?As outlined above ? ?Acute pulmonary embolism (HCC) ?Pt had severe persistent R pleuritic chest pain, tachycardia.  D-dimer >9.  CTA on 4/5 revealed "Acute pulmonary embolism in the multiple segmental and subsegmental branches in the right lung. There is small thrombus burden. There are no signs of acute right ventricular strain..." possible pulmonary infarction. ?-- Transitioned off heparin drip to Eliquis after cath 4/7 ?-- Continue Eliquis ? ?Pleuritic chest pain ?Seems musculoskeletal.  Initially low suspicion for PE, patient had no hypoxia.  He later became hypoxic requiring 3 L/min and noted to have persistent tachycardia with worsening pleuritic chest pain. ?D-dimer is elevated above 9. ?Subsequent CTA chest confirmed right-sided PE. ?--Now on anticoagulation as outlined ?-- Supportive care with analgesia as needed ?-- Incentive spirometer ? ?Pleural effusion on left ?Status post bedside thoracentesis on 4/4 with 850 cc removed.  Pleural fluid culture negative negative for malignancy. ? ?  CAP (community acquired pneumonia) ?Started on Rocephin and Zithromax. ?Antibiotic was changed to cefepime due to worsening leukocytosis, add Pseudomonas coverage given his history of alcoholism and just recently in recovery. ?--Continue cefepime. ?--Supportive care. ? ?AKI  (acute kidney injury) (HCC) ?POA, resolved.  Likely prerenal etiology given increased home dose Lasix in days prior to admission. ?--Monitor BMP ? ?Hyperkalemia ?Resolved.  POA with potassium 6.1 in the setting of worsening renal function and potassium supplementation at home with his diuretic.  No EKG changes.   ?K today 4.4 ?-- Monitor BMP ? ?Syncope ?On admission, patient reported feeling dizzy and someone was able to hold him before falling down.  No chest pain.  Recently increased Lasix.  Patient has very low EF. ?Likely due to poor cardiac output and hypotension. ?-- Monitor orthostatic vitals ?-- Further evaluation pending clinical course ? ?Lactic acidosis ?POA, resolved.   ?Most likely secondary to decreased perfusio in the setting of cardiogenic shock n.  No leukocytosis or fever.  CT chest was concerning for multifocal pneumonia, started on empiric antibiotics on admission. ? ? ?HTN (hypertension), malignant ?Admitted with cardiogenic shock, required Levophed. ?Home regimen metoprolol 100 mg daily, isosorbide, hydralazine 25 mg 3 times a day and Lasix 40 mg twice daily at home. ?--Currently on milrinone ?--Hold antihypertensives ?-- Cardiology following ? ? ?Alcohol use disorder, moderate, dependence (HCC) ?Last drink 8 days prior to admission.  He was admitted in [facility] for alcohol detox on 3/21, treated with Librium, discharged 3/24.  Denies any alcohol consumption since.  No signs of withdrawal. ?--Stop CIWA ?-- Monitor ? ?Tobacco abuse counseling ?Counseled regarding importance of cessation.  Nicotine patch ordered. ? ?Parapneumonic effusion ?Repeat CXR 4/10 due to rising WBC despite Cefepime - it showed worsening multilobar PNA with MODERATE RIGHT parapneumonic effusion. ?--IR consulted for thoracentesis ?--Follow pleural fluid labs & culture ?--Pulmonology consulted Dr. Meredeth Ide, awaiting recommendations ? ?Leukocytosis ?WBC trending up despite being on Cefepime for PNA x several days. ?See A&P  for PNA. ?Follow CBC. ? ? ? ? ?  ? ?Subjective: Patient awake resting in bed when seen today.  He reports overall feeling fairly well but does continue to report a cough productive of brown-colored phlegm tinged with old appearing blood at times.  Otherwise denies fevers chills or other acute complaints.  Staff report patient leaving the floor to go outside and smoke.  Unit director is discussed this with the patient. ? ?Physical Exam: ?Vitals:  ? 01/23/22 2048 01/24/22 0400 01/24/22 0742 01/24/22 1525  ?BP: (!) 100/58 110/76 118/74 108/73  ?Pulse: 100 100 (!) 109 (!) 103  ?Resp: 20 19 18 18   ?Temp: 99.7 ?F (37.6 ?C) (!) 97.5 ?F (36.4 ?C) 98.6 ?F (37 ?C) 98.4 ?F (36.9 ?C)  ?TempSrc:  Oral    ?SpO2: 92% 95% 90% 95%  ?Weight:      ?Height:      ? ?General exam: awake, appears drowsy and begins to drift to sleep, no acute distress ?HEENT: moist mucus membranes, hearing grossly normal  ?Respiratory system: CTAB diminished on the right, no wheezes, rales or rhonchi, normal respiratory effort. ?Cardiovascular system: normal S1/S2, RRR, no pedal edema.   ?Central nervous system: A&O x3. no gross focal neurologic deficits, normal speech ?Extremities: moves all, no edema, normal tone ?Skin: dry, intact, normal temperature ?Psychiatry: normal mood, congruent affect, judgement and insight appear normal ? ? ?Data Reviewed: ? ?Labs reviewed notable for worsening leukocytosis with white count 15.5, hemoglobin stable 8.7.  BMP normal other than glucose  141. ? ?Chest x-ray this morning --- ?IMPRESSION: ?1. Worsening multilobar pneumonia in the right lung with enlarging ?moderate right parapneumonic pleural effusion. ?2. Moderate cardiomegaly. ? ?Family Communication: None at bedside on rounds, will attempt to call ? ?Disposition: ?Status is: Inpatient ?Remains inpatient appropriate because: Worsening pneumonia with new parapneumonic effusion.  Ongoing evaluation of this and remains on IV antibiotics. ? ? Planned Discharge  Destination: Home ? ? ? ?Time spent: 40 minutes ? ?Author: ?Pennie Banter, DO ?01/24/2022 4:29 PM ? ?For on call review [email or web address].  ?

## 2022-01-24 NOTE — Assessment & Plan Note (Addendum)
WBC trending up despite being on Cefepime for PNA x several days. ?See A&P for PNA, and R pleural effusion. ?Follow CBC. ?

## 2022-01-24 NOTE — Progress Notes (Signed)
? ? ?Progress Note ? ?Patient Name: Mario Beck ?Date of Encounter: 01/24/2022 ? ?Primary Cardiologist: New to [facility] - consult by End ? ?Subjective  ? ?He continues to note right-sided chest discomfort that is worse with deep breathing or coughing. No documented UOP over the past 24 hours, net - 12.9 L for the admission. Weight trend 89 to 86.6 kg from 4/7 to 4/9. Renal function trending upwards, though remains normal.  ? ?Inpatient Medications  ?  ?Scheduled Meds: ? apixaban  10 mg Oral BID  ? Followed by  ? [START ON 01/28/2022] apixaban  5 mg Oral BID  ? carvedilol  3.125 mg Oral BID WC  ? Chlorhexidine Gluconate Cloth  6 each Topical Daily  ? dapagliflozin propanediol  10 mg Oral Daily  ? digoxin  0.125 mg Oral Daily  ? folic acid  1 mg Oral Daily  ? furosemide  40 mg Oral Daily  ? lidocaine  1 patch Transdermal Q24H  ? losartan  12.5 mg Oral Daily  ? multivitamin with minerals  1 tablet Oral Daily  ? nicotine  21 mg Transdermal Daily  ? sodium chloride flush  3 mL Intravenous Q12H  ? spironolactone  25 mg Oral Daily  ? thiamine  100 mg Oral Daily  ? ?Continuous Infusions: ? sodium chloride 250 mL (01/23/22 1124)  ? ceFEPime (MAXIPIME) IV 2 g (01/24/22 1116)  ? ?PRN Meds: ?acetaminophen **OR** acetaminophen, guaiFENesin, hydrOXYzine, ondansetron (ZOFRAN) IV, oxyCODONE, polyethylene glycol  ? ?Vital Signs  ?  ?Vitals:  ? 01/23/22 1437 01/23/22 2048 01/24/22 0400 01/24/22 0742  ?BP: 117/82 (!) 100/58 110/76 118/74  ?Pulse: (!) 106 100 100 (!) 109  ?Resp: 16 20 19 18   ?Temp: 99 ?F (37.2 ?C) 99.7 ?F (37.6 ?C) (!) 97.5 ?F (36.4 ?C) 98.6 ?F (37 ?C)  ?TempSrc:   Oral   ?SpO2: 90% 92% 95% 90%  ?Weight:      ?Height:      ? ? ?Intake/Output Summary (Last 24 hours) at 01/24/2022 1216 ?Last data filed at 01/23/2022 1851 ?Gross per 24 hour  ?Intake 360 ml  ?Output --  ?Net 360 ml  ? ?Filed Weights  ? 01/20/22 0500 01/21/22 0458 01/23/22 0500  ?Weight: 90.8 kg 89 kg 86.6 kg  ? ? ?Telemetry  ?  ?Not on tele - Personally  Reviewed ? ?ECG  ?  ?No new tracings - Personally Reviewed ? ?Physical Exam  ? ?GEN: No acute distress.   ?Neck: No JVD. ?Cardiac: RRR, no murmurs, rubs, or gallops.  ?Respiratory: Clear to auscultation bilaterally.  ?GI: Soft, nontender, non-distended.   ?MS: No edema; No deformity. ?Neuro:  Alert and oriented x 3; Nonfocal.  ?Psych: Normal affect. ? ?Labs  ?  ?Chemistry ?Recent Labs  ?Lab 01/17/22 ?1932 01/18/22 ?[id number] 01/22/22 ?[id number] 01/23/22 ?[id number] 01/24/22 ?0404  ?NA 136   < > 135 131* 137  ?K 6.0*   < > 4.0 3.9 4.3  ?CL 100   < > 97* 95* 99  ?CO2 18*   < > 30 29 28   ?GLUCOSE 124*   < > 135* 183* 141*  ?BUN 30*   < > 11 12 16   ?CREATININE 2.40*   < > 0.85 0.88 1.03  ?CALCIUM 8.5*   < > 8.7* 8.7* 9.1  ?PROT 7.3  --   --   --   --   ?ALBUMIN 3.3*  --   --   --   --   ?AST 94*  --   --   --   --   ?  ALT 42  --   --   --   --   ?ALKPHOS 314*  --   --   --   --   ?BILITOT 1.8*  --   --   --   --   ?GFRNONAA 34*   < > >60 >60 >60  ?ANIONGAP 18*   < > 8 7 10   ? < > = values in this interval not displayed.  ?  ? ?Hematology ?Recent Labs  ?Lab 01/22/22 ?[id number] 01/23/22 ?[id number] 01/24/22 ?0404  ?WBC 12.5* 13.9* 15.5*  ?RBC 3.15* 3.16* 3.20*  ?HGB 8.6* 8.5* 8.7*  ?HCT 25.7* 25.7* 26.1*  ?MCV 81.6 81.3 81.6  ?MCH 27.3 26.9 27.2  ?MCHC 33.5 33.1 33.3  ?RDW 18.0* 18.4* 18.2*  ?PLT 279 322 373  ? ? ?Cardiac EnzymesNo results for input(s): TROPONINI in the last 168 hours. No results for input(s): TROPIPOC in the last 168 hours.  ? ?BNP ?No results for input(s): BNP, PROBNP in the last 168 hours. ?  ? ?DDimer  ?Recent Labs  ?Lab 01/19/22 ?1542  ?DDIMER 9.39*  ?  ? ?Radiology  ?  ?DG Chest Port 1 View ? ?Result Date: 01/24/2022 ?CLINICAL DATA:  41 year old male with history of shortness of breath. Worsening leukocytosis. EXAM: PORTABLE CHEST 1 VIEW COMPARISON:  Chest x-ray 01/18/2022. FINDINGS: Increasing airspace consolidation throughout the right mid to lower lung with enlarging moderate to large right parapneumonic pleural effusion.  Left lung is clear. No left pleural effusion. No pneumothorax. No evidence of pulmonary edema. Heart size is moderately enlarged. Upper mediastinal contours are within normal limits. IMPRESSION: 1. Worsening multilobar pneumonia in the right lung with enlarging moderate right parapneumonic pleural effusion. 2. Moderate cardiomegaly. Electronically Signed   By: Vinnie Langton M.D.   On: 01/24/2022 09:02   ? ?Cardiac Studies  ? ?R/LHC 01/21/2022: ?  Mid LAD lesion is 40% stenosed. ?  There is severe left ventricular systolic dysfunction. ?  LV end diastolic pressure is mildly elevated. ?  The left ventricular ejection fraction is less than 25% by visual estimate. ?  ?1.  Moderate one-vessel coronary artery disease with 40% stenosis in the mid LAD.  No evidence of obstructive disease. ?2.  Severely reduced LV systolic function with an EF of 15%. ?3.  Right heart catheterization showed mildly elevated filling pressures, mild pulmonary hypertension and normal cardiac output. ?  ?Recommendations: ?The patient has nonischemic cardiomyopathy.  We will switch to oral furosemide and try to add heart failure medications.  Continue milrinone likely for another day. ?__________ ? ?2D echo 01/18/2022: ?1. Left ventricular ejection fraction, by estimation, is 20 to 25%. The  ?left ventricle has severely decreased function. The left ventricle  ?demonstrates global hypokinesis. The left ventricular internal cavity size  ?was severely dilated. Indeterminate  ?diastolic filling due to E-A fusion.  ? 2. Right ventricular systolic function is moderately reduced. The right  ?ventricular size is mildly enlarged.  ? 3. Left atrial size was mildly dilated.  ? 4. Right atrial size was mildly dilated.  ? 5. Moderate pleural effusion in the left lateral region.  ? 6. The mitral valve is normal in structure. Mild mitral valve  ?regurgitation.  ? 7. Tricuspid valve regurgitation is moderate.  ? 8. The aortic valve is tricuspid. Aortic valve  regurgitation is not  ?visualized. No aortic stenosis is present. ?__________ ? ?2D echo 06/18/2021: ?1. Left ventricular ejection fraction, by estimation, is <20%. The left  ?ventricle has severely decreased function. The  left ventricle demonstrates  ?global hypokinesis. The left ventricular internal cavity size was  ?moderately to severely dilated. There is  ? mild left ventricular hypertrophy. Left ventricular diastolic parameters  ?are consistent with Grade II diastolic dysfunction (pseudonormalization).  ? 2. Right ventricular systolic function is moderately reduced. The right  ?ventricular size is mildly enlarged. There is normal pulmonary artery  ?systolic pressure.  ? 3. Left atrial size was severely dilated.  ? 4. Right atrial size was moderately dilated.  ? 5. The mitral valve is normal in structure. Moderate to severe mitral  ?valve regurgitation. No evidence of mitral stenosis.  ? 6. Tricuspid valve regurgitation is moderate.  ? 7. The aortic valve is normal in structure. Aortic valve regurgitation is  ?not visualized. No aortic stenosis is present.  ? 8. The inferior vena cava is normal in size with greater than 50%  ?respiratory variability, suggesting right atrial pressure of 3 mmHg. ?__________ ? ?2D echo 09/16/2020: ? 1. Left ventricular ejection fraction, by estimation, is 30 to 35%. The  ?left ventricle has moderately decreased function. The left ventricle  ?demonstrates global hypokinesis. The left ventricular internal cavity size  ?was severely dilated. There is mild  ?left ventricular hypertrophy. Left ventricular diastolic parameters are  ?consistent with Grade II diastolic dysfunction (pseudonormalization).  ? 2. Right ventricular systolic function is moderately reduced. The right  ?ventricular size is moderately enlarged.  ? 3. Left atrial size was severely dilated.  ? 4. Right atrial size was severely dilated.  ? 5. The mitral valve is grossly normal. Moderate mitral valve  ?regurgitation.  ?  6. The aortic valve is grossly normal. Aortic valve regurgitation is not  ?visualized. Mild aortic valve sclerosis is present, with no evidence of  ?aortic valve stenosis. ? ?Patient Profile  ?   ?41 y.o. male with h

## 2022-01-25 ENCOUNTER — Inpatient Hospital Stay: Payer: Medicaid Other

## 2022-01-25 LAB — BASIC METABOLIC PANEL
Anion gap: 9 (ref 5–15)
BUN: 15 mg/dL (ref 6–20)
CO2: 28 mmol/L (ref 22–32)
Calcium: 8.8 mg/dL — ABNORMAL LOW (ref 8.9–10.3)
Chloride: 100 mmol/L (ref 98–111)
Creatinine, Ser: 1.13 mg/dL (ref 0.61–1.24)
GFR, Estimated: >60 mL/min (ref >60)
Glucose, Bld: 125 mg/dL — ABNORMAL HIGH (ref 70–99)
Potassium: 4.3 mmol/L (ref 3.5–5.1)
Sodium: 137 mmol/L (ref 135–145)

## 2022-01-25 LAB — BODY FLUID CELL COUNT WITH DIFFERENTIAL
Eos, Fluid: 0 %
Lymphs, Fluid: 24 %
Monocyte-Macrophage-Serous Fluid: 9 %
Neutrophil Count, Fluid: 67 %
Other Cells, Fluid: 0 %
Total Nucleated Cell Count, Fluid: 1462 cu mm

## 2022-01-25 LAB — CBC
HCT: 25 % — ABNORMAL LOW (ref 39.0–52.0)
Hemoglobin: 8.3 g/dL — ABNORMAL LOW (ref 13.0–17.0)
MCH: 26.9 pg (ref 26.0–34.0)
MCHC: 33.2 g/dL (ref 30.0–36.0)
MCV: 81.2 fL (ref 80.0–100.0)
Platelets: 465 10*3/uL — ABNORMAL HIGH (ref 150–400)
RBC: 3.08 MIL/uL — ABNORMAL LOW (ref 4.22–5.81)
RDW: 18.3 % — ABNORMAL HIGH (ref 11.5–15.5)
WBC: 14.7 10*3/uL — ABNORMAL HIGH (ref 4.0–10.5)
nRBC: 0.3 % — ABNORMAL HIGH (ref 0.0–0.2)

## 2022-01-25 LAB — DIGOXIN LEVEL: Digoxin Level: 0.4 ng/mL — ABNORMAL LOW (ref 0.8–2.0)

## 2022-01-25 LAB — LACTATE DEHYDROGENASE, PLEURAL OR PERITONEAL FLUID: LD, Fluid: 889 U/L — ABNORMAL HIGH (ref 3–23)

## 2022-01-25 LAB — PROTEIN, PLEURAL OR PERITONEAL FLUID: Total protein, fluid: 4.4 g/dL

## 2022-01-25 MED ORDER — SODIUM CHLORIDE 0.9 % IV SOLN
3.0000 g | Freq: Four times a day (QID) | INTRAVENOUS | Status: AC
  Administered 2022-01-25 – 2022-01-26 (×5): 3 g via INTRAVENOUS
  Filled 2022-01-25 (×5): qty 3

## 2022-01-25 NOTE — Progress Notes (Signed)
? ? ? ?Pulmonary Medicine  ? ? ? ? ? ? ? ? ?Date: 01/25/2022,   ?MRN# [id number] Mario Beck 05-03-1981 ? ? ?  ? ? ? ?HISTORY OF PRESENT ILLNESS  ? ?Following dyspnea and increasing right pleural effusion. ? Developing empyema in the setting of pneumonia. ( See last hpi). No worsening dyspnea or septic parameters.  ? ? ?PAST MEDICAL HISTORY  ? ?Past Medical History:  ?Diagnosis Date  ? Alcohol abuse   ? Asthma   ? CHF (congestive heart failure) (HCC)   ? Depression   ? Hypertension   ? ? ? ?SURGICAL HISTORY  ? ?Past Surgical History:  ?Procedure Laterality Date  ? FRACTURE SURGERY    ? Left leg as a kid  ? RIGHT/LEFT HEART CATH AND CORONARY ANGIOGRAPHY N/A 01/21/2022  ? Procedure: RIGHT/LEFT HEART CATH AND CORONARY ANGIOGRAPHY;  Surgeon: Iran Ouch, MD;  Location: [facility] INVASIVE CV LAB;  Service: Cardiovascular;  Laterality: N/A;  ? ? ? ?FAMILY HISTORY  ? ?Family History  ?Problem Relation Age of Onset  ? Colon cancer Neg Hx   ? Esophageal cancer Neg Hx   ? ? ? ?SOCIAL HISTORY  ? ?Social History  ? ?Tobacco Use  ? Smoking status: Every Day  ?  Packs/day: 1.00  ?  Types: Cigarettes  ? Smokeless tobacco: Current  ?  Types: Snuff  ? Tobacco comments:  ?  Does pouches as welll   ?Vaping Use  ? Vaping Use: Never used  ?Substance Use Topics  ? Alcohol use: Not Currently  ?  Alcohol/week: 7.0 standard drinks  ?  Types: 7 Cans of beer per week  ? Drug use: No  ? ? ? ?MEDICATIONS  ? ? ?Home Medication:  ?  ?Current Medication: ? ?Current Facility-Administered Medications:  ?  0.9 %  sodium chloride infusion, 250 mL, Intravenous, Continuous, [name], [name], MD, Last Rate: 10 mL/hr at 01/23/22 1124, 250 mL at 01/23/22 1124 ?  acetaminophen (TYLENOL) tablet 650 mg, 650 mg, Oral, Q6H PRN, 650 mg at 01/24/22 2245 **OR** acetaminophen (TYLENOL) suppository 650 mg, 650 mg, Rectal, Q6H PRN, Iran Ouch, MD ?  apixaban (ELIQUIS) tablet 10 mg, 10 mg, Oral, BID, 10 mg at 01/25/22 1113 **FOLLOWED BY** [START ON 01/28/2022]  apixaban (ELIQUIS) tablet 5 mg, 5 mg, Oral, BID, [name], DO ?  carvedilol (COREG) tablet 3.125 mg, 3.125 mg, Oral, BID WC, [name], [name], MD, 3.125 mg at 01/25/22 0956 ?  ceFEPIme (MAXIPIME) 2 g in sodium chloride 0.9 % 100 mL IVPB, 2 g, Intravenous, Q8H, [name], [name], MD, Last Rate: 200 mL/hr at 01/25/22 0412, 2 g at 01/25/22 0412 ?  dapagliflozin propanediol (FARXIGA) tablet 10 mg, 10 mg, Oral, Daily, [name], [name], PA-C, 10 mg at 01/25/22 4163 ?  digoxin (LANOXIN) tablet 0.125 mg, 0.125 mg, Oral, Daily, [name], [name], MD, 0.125 mg at 01/25/22 0956 ?  folic acid (FOLVITE) tablet 1 mg, 1 mg, Oral, Daily, [name], [name], MD, 1 mg at 01/25/22 1114 ?  furosemide (LASIX) tablet 40 mg, 40 mg, Oral, Daily, [name], [name], MD, 40 mg at 01/25/22 0956 ?  guaiFENesin (ROBITUSSIN) 100 MG/5ML liquid 5 mL, 5 mL, Oral, Q4H PRN, [name], DO, 5 mL at 01/23/22 2022 ?  hydrOXYzine (ATARAX) tablet 25 mg, 25 mg, Oral, TID PRN, Iran Ouch, MD, 25 mg at 01/18/22 2033 ?  lidocaine (LIDODERM) 5 % 1 patch, 1 patch, Transdermal, Q24H, [name], [name],  MD, 1 patch at 01/24/22 1116 ?  losartan (COZAAR) tablet 12.5 mg, 12.5 mg, Oral, Daily, [name], [name], MD, 12.5 mg at 01/25/22 4235 ?  multivitamin with minerals tablet 1 tablet, 1 tablet, Oral, Daily, [name], MD, 1 tablet at 01/25/22 1113 ?  nicotine (NICODERM CQ - dosed in mg/24 hours) patch 21 mg, 21 mg, Transdermal, Daily, [name], MD, 21 mg at 01/25/22 0959 ?  ondansetron (ZOFRAN) injection 4 mg, 4 mg, Intravenous, Q6H PRN, Iran Ouch, MD, 4 mg at 01/17/22 1705 ?  oxyCODONE (Oxy IR/ROXICODONE) immediate release tablet 5-10 mg, 5-10 mg, Oral, Q4H PRN, Iran Ouch, MD, 10 mg at 01/25/22 1228 ?  polyethylene glycol (MIRALAX / GLYCOLAX) packet 17 g, 17 g, Oral, Daily PRN, [name], [name], MD ?  sodium chloride flush (NS) 0.9 % injection 3 mL, 3 mL, Intravenous, Q12H, [name], [name], MD, 3 mL at 01/24/22  2221 ?  spironolactone (ALDACTONE) tablet 25 mg, 25 mg, Oral, Daily, [name], [name], MD, 25 mg at 01/25/22 0956 ?  thiamine tablet 100 mg, 100 mg, Oral, Daily, [name], MD, 100 mg at 01/24/22 0825 ? ? ? ?ALLERGIES  ? ?Lisinopril and Shellfish allergy ? ? ? ? ?REVIEW OF SYSTEMS  ? ? ?Review of Systems: ? ?Gen:  Denies  fever, sweats, chills weigh loss  ?HEENT: Denies blurred vision, double vision, ear pain, eye pain, hearing loss, nose bleeds, sore throat ?Cardiac:  No dizziness, chest pain or heaviness, chest tightness,edema ?Resp:   Denies cough or sputum porduction, shortness of breath,wheezing, hemoptysis,  ?Gi: Denies swallowing difficulty, stomach pain, nausea or vomiting, diarrhea, constipation, bowel incontinence ?Gu:  Denies bladder incontinence, burning urine ?Ext:   Denies Joint pain, stiffness or swelling ?Skin: Denies  skin rash, easy bruising or bleeding or hives ?Endoc:  Denies polyuria, polydipsia , polyphagia or weight change ?Psych:   Denies depression, insomnia or hallucinations  ? ?Other:  All other systems negative ? ? ?VS: BP 108/74   Pulse 94   Temp 98.6 ?F (37 ?C) (Oral)   Resp 18   Ht 6\' 3"  (1.905 m)   Wt 88 kg   SpO2 94%   BMI 24.25 kg/m?   ? ? ? ?PHYSICAL EXAM  ? ? ?GENERAL:NAD, no fevers, chills, no weakness no fatigue ?HEAD: Normocephalic, atraumatic.  ?EYES: Pupils equal, round, reactive to light. Extraocular muscles intact. No scleral icterus.  ?MOUTH: Moist mucosal membrane. Dentition intact. No abscess noted.  ?EAR, NOSE, THROAT: Clear without exudates. No external lesions.  ?NECK: Supple. No thyromegaly. No nodules. No JVD.  ?PULMONARY: Diffuse coarse rhonchi right sided +wheezes ?CARDIOVASCULAR: S1 and S2. Regular rate and rhythm. No murmurs, rubs, or gallops. No edema. Pedal pulses 2+ bilaterally.  ?GASTROINTESTINAL: Soft, nontender, nondistended. No masses. Positive bowel sounds. No hepatosplenomegaly.  ?MUSCULOSKELETAL: No swelling, clubbing, or edema. Range of  motion full in all extremities.  ?NEUROLOGIC: Cranial nerves II through XII are intact. No gross focal neurological deficits. Sensation intact. Reflexes intact.  ?SKIN: No ulceration, lesions, rashes, or cyanosis. Skin warm and dry. Turgor intact.  ?PSYCHIATRIC: Mood, affect within normal limits. The patient is awake, alert and oriented x 3. Insight, judgment intact.  ? ? ?  ? ?IMAGING  ? ? ?DG Chest 2 View ? ?Result Date: 01/17/2022 ?CLINICAL DATA:  41 year old male with history of shortness of breath and chest pain. EXAM: CHEST - 2 VIEW COMPARISON:  Chest x-ray 12/08/2021. FINDINGS: New moderate left pleural effusion. Atelectasis  and/or consolidation in the base of the left lung. Right lung is clear. No right pleural effusion. No pneumothorax. No evidence of pulmonary edema. Heart size is normal. Upper mediastinal contours are within normal limits. IMPRESSION: 1. New moderate left pleural effusion with atelectasis and/or consolidation in the left lung base. Electronically Signed   By: Trudie Reed M.D.   On: 01/17/2022 09:43  ? ?CT Chest Wo Contrast ? ?Result Date: 01/17/2022 ?CLINICAL DATA:  Abnormal chest x-ray EXAM: CT CHEST WITHOUT CONTRAST TECHNIQUE: Multidetector CT imaging of the chest was performed following the standard protocol without IV contrast. RADIATION DOSE REDUCTION: This exam was performed according to the departmental dose-optimization program which includes automated exposure control, adjustment of the mA and/or kV according to patient size and/or use of iterative reconstruction technique. COMPARISON:  CT chest angio dated December 08, 2021 FINDINGS: Cardiovascular: Marked cardiomegaly. No pericardial effusion. Coronary artery calcifications of the LAD. Minimal calcified plaque of the thoracic aorta. Mediastinum/Nodes: Esophagus and thyroid are unremarkable. New mildly enlarged mediastinal lymph nodes, likely reactive. Reference pre-vascular lymph node measuring 1.1 cm in short axis on series  3, image 57. Lungs/Pleura: Central airways are patent. Mild paraseptal emphysema. Peripheral predominant ground-glass opacities of the lower lungs. Small left pleural effusion with atelectasis. Bilateral pulmo

## 2022-01-25 NOTE — Progress Notes (Signed)
?Progress Note ? ? ?Patient: Mario Beck MEQ:[id number] DOB: 09-Dec-1980 DOA: 01/17/2022     8 ?DOS: the patient was seen and examined on 01/25/2022 ?  ?Brief hospital course: ?HPI taken from ICU notes: ?"41 yo male with a history of cardiomyopathy presented to the [facility] ER on 4/3 complaining of 3-4 days of dyspnea, fatigue and weight gain. He says that he has been compliant with his medications which include furosemide, metoprolol, nexium, potassium and sertraline.  However he says that the lasix has not been working for him in the last week or so and his urine output has become very minimal and dark.  He has noted increasing dyspnea when he climbs a flight of stairs in his apartment and yesterday passed out twice when he was walking across the parking lot.   ?He was admitted to [facility] for alcohol detox on 3/21, treated with lithium, and discharged on 3/24.  He says that he has been compliant with his medications. ?He was supposed to see Dr. Welton Flakes with cardiology after his last discharge for alcoholic pancreatitis, acute decompensated CHF in 06/2021, but he missed his appointments and has not followed up.  ?He smokes 1 pack of cigarettes daily.  He quit drinking on 3/21 and has not had alcohol since then.  He denies illicit drug use." ? ?Patient was admitted to ICU with cardiogenic shock. ?Cardiology consulted.   ?Plan for L/R heart cath once euvolemic. ?Currently on milrinone and IV Lasix.   ?Weaned off Levophed.  ? ?Transferred to [facility] service 4/5. ? ?Found to have acute PE on the right with probable pulmonary infarct.  Started on heparin drip, then transitioned to Eliquis. ? ?4/10: despite several days Cefepime, WBC increasing.  Repeat CXR with worsened R-sided infiltrates and parapneumonia effusion.   ? ?Assessment and Plan: ?* Acute on chronic HFrEF (heart failure with reduced ejection fraction) (HCC) ?Admitted to ICU with cardiogenic shock 4/3.  Presented with worsening dyspnea and orthopnea, weight  gain, elevated BNP 2253, and bilateral pleural effusions. ?Most recent echo with EF less than 20% with moderate to severe MR. ?Right and left cardiac cath on 4/7 without significant CAD, findings consistent with nonischemic cardiomyopathy (see report, or conclusion below). ?-- Management per cardiology ?-- Taken off milrinone 4/8 ?-- Diuresed with IV Lasix 40 mg BID ?-- Transitioned to PO Lasix 40 mg daily ?-- Started on low-dose Coreg 3.125 mg BID ?-- Spironolactone, increased to 25 mg ?-- Started on Digoxin ?-- Weaned off Levophed ?-- Strict I/O's, Daily weights ? ?Net IO Since Admission: -12,918.88 mL [01/25/22 1439] ? ? ? ? ?Cardiogenic shock (HCC) ?As outlined above ? ?Acute pulmonary embolism (HCC) ?Pt had severe persistent R pleuritic chest pain, tachycardia.  D-dimer >9.  CTA on 4/5 revealed "Acute pulmonary embolism in the multiple segmental and subsegmental branches in the right lung. There is small thrombus burden. There are no signs of acute right ventricular strain..." possible pulmonary infarction. ?-- Transitioned off heparin drip to Eliquis after cath 4/7 ?-- Continue Eliquis ? ?Pleuritic chest pain ?Seems musculoskeletal.  Initially low suspicion for PE, patient had no hypoxia.  He later became hypoxic requiring 3 L/min and noted to have persistent tachycardia with worsening pleuritic chest pain. ?D-dimer is elevated above 9. ?Subsequent CTA chest confirmed right-sided PE.  Also has a R parapneumonic effusion s/p thora today (4/11). ?--Now on anticoagulation as outlined ?-- Supportive care with analgesia as needed ?-- Incentive spirometer ? ?Pleural effusion on left ?Status post bedside thoracentesis on 4/4  with 850 cc removed.  Pleural fluid culture negative, cytology negative for malignancy. ? ?CAP (community acquired pneumonia) ?Started on Rocephin and Zithromax. ?Antibiotic was changed to cefepime due to worsening leukocytosis, add Pseudomonas coverage given his history of alcoholism and just  recently in recovery. ?--Continue cefepime. ?--Supportive care. ?--?R parapneumonic effusion ? ?AKI (acute kidney injury) (HCC) ?POA, resolved.  Likely prerenal etiology given increased home dose Lasix in days prior to admission. ?--Monitor BMP ? ?Hyperkalemia ?Resolved.  POA with potassium 6.1 in the setting of worsening renal function and potassium supplementation at home with his diuretic.  No EKG changes.   ?K today 4.3 ?-- Monitor BMP ? ?Syncope ?On admission, patient reported feeling dizzy and someone was able to hold him before falling down.  No chest pain.  Recently increased Lasix.  Patient has very low EF.  Due to poor cardiac output and hypotension.  No recurrence reported since admission and BP improved. ? ?Lactic acidosis ?POA, resolved.   ?Most likely secondary to decreased perfusio in the setting of cardiogenic shock n.  No leukocytosis or fever.  CT chest was concerning for multifocal pneumonia, started on empiric antibiotics on admission. ? ? ?HTN (hypertension), malignant ?Admitted with cardiogenic shock, required Levophed. ?Home regimen metoprolol 100 mg daily, isosorbide, hydralazine 25 mg 3 times a day and Lasix 40 mg twice daily at home. ?--Now off milrinone ?-- On Coreg, Lasix, losartan, spiro ?-- Cardiology following ? ? ?Alcohol use disorder, moderate, dependence (HCC) ?Last drink 8 days prior to admission, recently detoxed in [place] and had been at a rehab facility as well.   ?Denies any alcohol consumption since.  No signs of withdrawal.   ?Stop CIWA.  Stable. ? ? ?Tobacco abuse counseling ?Counseled regarding importance of cessation.  Nicotine patch ordered. ? ?Parapneumonic effusion ?Repeat CXR 4/10 due to rising WBC despite Cefepime - it showed worsening multilobar PNA with MODERATE RIGHT parapneumonic effusion. ?IR consulted for thoracentesis, done today.  Effusion is severely loculated, only 200 cc removed ?--Follow pleural fluid labs & culture ?--Pulmonology consulted (Dr.  Meredeth Ide) ? ?Leukocytosis ?WBC trending up despite being on Cefepime for PNA x several days. ?See A&P for PNA, and R pleural effusion. ?Follow CBC. ? ? ? ? ?  ? ?Subjective: Pt sleeping when seen, give pain medication after thoracentesis.  No acute events reported.  Wakes just briefly, but quickly falls back to sleep. ? ?Physical Exam: ?Vitals:  ? 01/25/22 0454 01/25/22 0733 01/25/22 1137 01/25/22 1227  ?BP: 106/70 101/65 104/68 108/74  ?Pulse: 86 96 (!) 101 94  ?Resp: 18 18    ?Temp: 98.9 ?F (37.2 ?C) 98.6 ?F (37 ?C)    ?TempSrc:  Oral    ?SpO2: 97% 92% 95% 94%  ?Weight:      ?Height:      ? ?General exam: sleeping comfortable, no acute distress ?HEENT: moist mucus membranes, hearing grossly normal  ?Respiratory system: CTAB diminished bases R>L, no wheezes, normal respiratory effort. ?Cardiovascular system: normal S1/S2, RRR, no pedal edema.   ?Central nervous system: unable to evaluate due to somnolence ?Extremities: no edema, normal tone ?Skin: dry, intact, no rashes seen ?Psychiatry: unable to evaluate due to somnolence ? ? ?Data Reviewed: ? ?Notable labs: Glucose 125, calcium 8.8, WBC slightly improved to 14.7, hemoglobin 8.3, digoxin level low, 0.4 ? ?Family Communication: None at bedside on rounds ? ?Disposition: ?Status is: Inpatient ?Remains inpatient appropriate because: Ongoing evaluation not appropriate for the outpatient setting, thoracentesis today, monitor fluid studies and culture. ? ?  Planned Discharge Destination: Home ? ? ? ?Time spent: 35 minutes ? ?Author: ?Pennie Banter, DO ?01/25/2022 2:46 PM ? ?For on call review [email or web address].  ?

## 2022-01-25 NOTE — Procedures (Signed)
PROCEDURE SUMMARY: ? ?[place] evaluation of right pleural space revealed severely loculated effusion. ?Successful US guided right thoracentesis. ?Yielded 200 mL of blood tinged fluid. ?Pt tolerated procedure well. ?No immediate complications. ? ?Specimen was sent for labs. ?CXR ordered. ? ?EBL < 1 mL ? ?Pattricia Boss D PA-C ?01/25/2022 ?11:55 AM ? ? ? ?

## 2022-01-25 NOTE — Progress Notes (Signed)
? ? ?Progress Note ? ?Patient Name: Mario Beck ?Date of Encounter: 01/25/2022 ? ?Primary Cardiologist: New to [facility] - consult by End ? ?Subjective  ? ?CXR yesterday showed worsening multilobar pneumonia in the right lung with enlarging moderate right parapneumonic pleural effusion.  He underwent right-sided thoracentesis with 200 mL of pleural fluids removed. Follow up CXR demonstrated similar right lung opacities with a new left midlung opacity as well as a moderate right pleural effusion.  Following thoracentesis, the patient notes an improvement in his right-sided chest discomfort.  No documented UOP over the past 24 hours, net - 12.9 L for the admission (unchanged from yesterday). Renal function stable.  ? ?Inpatient Medications  ?  ?Scheduled Meds: ? apixaban  10 mg Oral BID  ? Followed by  ? [START ON 01/28/2022] apixaban  5 mg Oral BID  ? carvedilol  3.125 mg Oral BID WC  ? dapagliflozin propanediol  10 mg Oral Daily  ? digoxin  0.125 mg Oral Daily  ? folic acid  1 mg Oral Daily  ? furosemide  40 mg Oral Daily  ? lidocaine  1 patch Transdermal Q24H  ? losartan  12.5 mg Oral Daily  ? multivitamin with minerals  1 tablet Oral Daily  ? nicotine  21 mg Transdermal Daily  ? sodium chloride flush  3 mL Intravenous Q12H  ? spironolactone  25 mg Oral Daily  ? thiamine  100 mg Oral Daily  ? ?Continuous Infusions: ? sodium chloride 250 mL (01/23/22 1124)  ? ceFEPime (MAXIPIME) IV 2 g (01/25/22 0412)  ? ?PRN Meds: ?acetaminophen **OR** acetaminophen, guaiFENesin, hydrOXYzine, ondansetron (ZOFRAN) IV, oxyCODONE, polyethylene glycol  ? ?Vital Signs  ?  ?Vitals:  ? 01/25/22 0454 01/25/22 0733 01/25/22 1137 01/25/22 1227  ?BP: 106/70 101/65 104/68 108/74  ?Pulse: 86 96 (!) 101 94  ?Resp: 18 18    ?Temp: 98.9 ?F (37.2 ?C) 98.6 ?F (37 ?C)    ?TempSrc:  Oral    ?SpO2: 97% 92% 95% 94%  ?Weight:      ?Height:      ? ?No intake or output data in the 24 hours ending 01/25/22 1603 ? ?Filed Weights  ? 01/21/22 0458 01/23/22 0500  01/25/22 0422  ?Weight: 89 kg 86.6 kg 88 kg  ? ? ?Telemetry  ?  ?Not on tele - Personally Reviewed ? ?ECG  ?  ?No new tracings - Personally Reviewed ? ?Physical Exam  ? ?GEN: No acute distress.   ?Neck: No JVD. ?Cardiac: RRR, no murmurs, rubs, or gallops.  ?Respiratory: Diminished breath sounds along the right base with coarse breath sounds along the left lung.  ?GI: Soft, nontender, non-distended.   ?MS: No edema; No deformity. ?Neuro:  Alert and oriented x 3; Nonfocal.  ?Psych: Normal affect. ? ?Labs  ?  ?Chemistry ?Recent Labs  ?Lab 01/23/22 ?0704 01/24/22 ?0404 01/25/22 ?0411  ?NA 131* 137 137  ?K 3.9 4.3 4.3  ?CL 95* 99 100  ?CO2 29 28 28   ?GLUCOSE 183* 141* 125*  ?BUN 12 16 15   ?CREATININE 0.88 1.03 1.13  ?CALCIUM 8.7* 9.1 8.8*  ?GFRNONAA >60 >60 >60  ?ANIONGAP 7 10 9   ? ?  ? ?Hematology ?Recent Labs  ?Lab 01/23/22 ?0704 01/24/22 ?0404 01/25/22 ?0411  ?WBC 13.9* 15.5* 14.7*  ?RBC 3.16* 3.20* 3.08*  ?HGB 8.5* 8.7* 8.3*  ?HCT 25.7* 26.1* 25.0*  ?MCV 81.3 81.6 81.2  ?MCH 26.9 27.2 26.9  ?MCHC 33.1 33.3 33.2  ?RDW 18.4* 18.2* 18.3*  ?PLT  322 373 465*  ? ? ? ?Cardiac EnzymesNo results for input(s): TROPONINI in the last 168 hours. No results for input(s): TROPIPOC in the last 168 hours.  ? ?BNP ?No results for input(s): BNP, PROBNP in the last 168 hours. ?  ? ?DDimer  ?Recent Labs  ?Lab 01/19/22 ?1542  ?DDIMER 9.39*  ? ?  ? ?Radiology  ?  ?DG Chest Port 1 View ? ?Result Date: 01/25/2022 ?IMPRESSION: 1. Similar right lung opacities.  New left midlung opacity. 2. Moderate right pleural effusion. No visible pneumothorax status post reported thoracentesis. 3. Similar cardiomegaly. Electronically Signed   By: Margaretha Sheffield M.D.   On: 01/25/2022 12:12  ? ?DG Chest Port 1 View ? ?Result Date: 01/24/2022 ?IMPRESSION: 1. Worsening multilobar pneumonia in the right lung with enlarging moderate right parapneumonic pleural effusion. 2. Moderate cardiomegaly. Electronically Signed   By: Vinnie Langton M.D.   On: 01/24/2022  09:02  ? ?US THORACENTESIS ASP PLEURAL SPACE W/IMG GUIDE ? ?Result Date: 01/25/2022 ?IMPRESSION: Successful ultrasound guided right thoracentesis yielding 200 mL of pleural fluid. This exam was performed by Tsosie Billing PA-C, and was supervised and interpreted by Dr. Denna Haggard. Electronically Signed   By: Albin Felling M.D.   On: 01/25/2022 12:18   ? ?Cardiac Studies  ? ?R/LHC 01/21/2022: ?  Mid LAD lesion is 40% stenosed. ?  There is severe left ventricular systolic dysfunction. ?  LV end diastolic pressure is mildly elevated. ?  The left ventricular ejection fraction is less than 25% by visual estimate. ?  ?1.  Moderate one-vessel coronary artery disease with 40% stenosis in the mid LAD.  No evidence of obstructive disease. ?2.  Severely reduced LV systolic function with an EF of 15%. ?3.  Right heart catheterization showed mildly elevated filling pressures, mild pulmonary hypertension and normal cardiac output. ?  ?Recommendations: ?The patient has nonischemic cardiomyopathy.  We will switch to oral furosemide and try to add heart failure medications.  Continue milrinone likely for another day. ?__________ ? ?2D echo 01/18/2022: ?1. Left ventricular ejection fraction, by estimation, is 20 to 25%. The  ?left ventricle has severely decreased function. The left ventricle  ?demonstrates global hypokinesis. The left ventricular internal cavity size  ?was severely dilated. Indeterminate  ?diastolic filling due to E-A fusion.  ? 2. Right ventricular systolic function is moderately reduced. The right  ?ventricular size is mildly enlarged.  ? 3. Left atrial size was mildly dilated.  ? 4. Right atrial size was mildly dilated.  ? 5. Moderate pleural effusion in the left lateral region.  ? 6. The mitral valve is normal in structure. Mild mitral valve  ?regurgitation.  ? 7. Tricuspid valve regurgitation is moderate.  ? 8. The aortic valve is tricuspid. Aortic valve regurgitation is not  ?visualized. No aortic stenosis is  present. ?__________ ? ?2D echo 06/18/2021: ?1. Left ventricular ejection fraction, by estimation, is <20%. The left  ?ventricle has severely decreased function. The left ventricle demonstrates  ?global hypokinesis. The left ventricular internal cavity size was  ?moderately to severely dilated. There is  ? mild left ventricular hypertrophy. Left ventricular diastolic parameters  ?are consistent with Grade II diastolic dysfunction (pseudonormalization).  ? 2. Right ventricular systolic function is moderately reduced. The right  ?ventricular size is mildly enlarged. There is normal pulmonary artery  ?systolic pressure.  ? 3. Left atrial size was severely dilated.  ? 4. Right atrial size was moderately dilated.  ? 5. The mitral valve is normal in structure. Moderate  to severe mitral  ?valve regurgitation. No evidence of mitral stenosis.  ? 6. Tricuspid valve regurgitation is moderate.  ? 7. The aortic valve is normal in structure. Aortic valve regurgitation is  ?not visualized. No aortic stenosis is present.  ? 8. The inferior vena cava is normal in size with greater than 50%  ?respiratory variability, suggesting right atrial pressure of 3 mmHg. ?__________ ? ?2D echo 09/16/2020: ? 1. Left ventricular ejection fraction, by estimation, is 30 to 35%. The  ?left ventricle has moderately decreased function. The left ventricle  ?demonstrates global hypokinesis. The left ventricular internal cavity size  ?was severely dilated. There is mild  ?left ventricular hypertrophy. Left ventricular diastolic parameters are  ?consistent with Grade II diastolic dysfunction (pseudonormalization).  ? 2. Right ventricular systolic function is moderately reduced. The right  ?ventricular size is moderately enlarged.  ? 3. Left atrial size was severely dilated.  ? 4. Right atrial size was severely dilated.  ? 5. The mitral valve is grossly normal. Moderate mitral valve  ?regurgitation.  ? 6. The aortic valve is grossly normal. Aortic valve  regurgitation is not  ?visualized. Mild aortic valve sclerosis is present, with no evidence of  ?aortic valve stenosis. ? ?Patient Profile  ?   ?41 y.o. male with history of HFrEF due to NICM secondary to  alcohol-ind

## 2022-01-25 NOTE — Plan of Care (Signed)
?  Problem: Education: ?Goal: Ability to demonstrate management of disease process will improve ?Outcome: Progressing ?  ?Problem: Education: ?Goal: Ability to verbalize understanding of medication therapies will improve ?Outcome: Progressing ?  ?Problem: Education: ?Goal: Individualized Educational Video(s) ?Outcome: Progressing ?  ?Problem: Activity: ?Goal: Capacity to carry out activities will improve ?Outcome: Progressing ?  ?Problem: Cardiac: ?Goal: Ability to achieve and maintain adequate cardiopulmonary perfusion will improve ?Outcome: Progressing ?  ?Problem: Education: ?Goal: Knowledge of General Education information will improve ?Description: Including pain rating scale, medication(s)/side effects and non-pharmacologic comfort measures ?Outcome: Progressing ?  ?Problem: Clinical Measurements: ?Goal: Diagnostic test results will improve ?Outcome: Progressing ?  ?Problem: Clinical Measurements: ?Goal: Respiratory complications will improve ?Outcome: Progressing ?  ?Problem: Clinical Measurements: ?Goal: Cardiovascular complication will be avoided ?Outcome: Progressing ?  ?Problem: Activity: ?Goal: Risk for activity intolerance will decrease ?Outcome: Progressing ?  ?Problem: Nutrition: ?Goal: Adequate nutrition will be maintained ?Outcome: Progressing ?  ?Problem: Pain Managment: ?Goal: General experience of comfort will improve ?Outcome: Progressing ?  ?

## 2022-01-25 NOTE — Consult Note (Addendum)
? ?  Heart Failure Nurse Navigator Note ? ?HFrEF 30% ? ?He presented to the emergency room with complaints of worsening dyspnea, fatigue and weight gain.  He also stated that he did not feel that his Lasix had been working for him and his urine output had decreased and was dark in color. ? ?Comorbidities: ? ?History of alcohol dependence ?History of tobacco abuse ?Alcoholic pancreatitis ?GERD ?Depression ? ?Medications: ? ?Apixaban 15 mg 2 times a day ?Carvedilol 3.125 mg 2 times a day with meals ?Farxiga 10 mg daily ?Digoxin 0.125 mg daily ?Furosemide 40 mg daily ?Losartan 12 and half milligrams daily ?NicoDerm patch 21 mg daily ?Spironolactone 25 mg daily ? ?Labs: ? ?Sodium 137, potassium 4.3, chloride 100, CO2 28, BUN 15, creatinine 1.13, digoxin level 0.4 ?Weight is 88 kg ?Intake and output is incomplete ? ?Initial meeting with patient today he was lying in bed in no acute distress.  States that he had returned earlier from the thoracentesis and was still having a lot of right-sided pain. ? ?Discussed heart failure. ? ?Went over the importance of daily weights, recording and what to report.  Also discussed signs and symptoms to report to physician. ? ?Also discussed the medications that he has been placed on the importance of being compliant.  Also abstaining from alcohol. ? ?Discussed salt and sodium.  He states that he has not   used salt.  He states that he and his girlfriend very rarely eat out.  ? ?He states that he is currently unemployed and has no insurance, so will be getting his medications from medication management.  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 ? ?He was given the living with heart failure teaching booklet, zone magnet, information on heart failure and information on sodium. ? ?He will be given an appointment to see the outpatient heart failure clinic on April 26 at 9:30 in the morning unless he is able to get into the advanced heart  failure clinic in [place] sooner. ? ?He had no further questions. ? ?Tresa Endo RN [facility] ?

## 2022-01-25 NOTE — Progress Notes (Signed)
Pt has clean dry and intact bandaid to right low back - s/p thoracentesis - pt c/o some cramp like pain to area - prn pain meds given for 7/10 pain.  No sob or chest pain noted and post procedure cxr - for pneumo  ?

## 2022-01-26 ENCOUNTER — Encounter: Payer: Self-pay | Admitting: Pulmonary Disease

## 2022-01-26 DIAGNOSIS — I2690 Septic pulmonary embolism without acute cor pulmonale: Secondary | ICD-10-CM

## 2022-01-26 LAB — CBC
HCT: 32 % — ABNORMAL LOW (ref 39.0–52.0)
Hemoglobin: 10.4 g/dL — ABNORMAL LOW (ref 13.0–17.0)
MCH: 26.5 pg (ref 26.0–34.0)
MCHC: 32.5 g/dL (ref 30.0–36.0)
MCV: 81.4 fL (ref 80.0–100.0)
Platelets: 628 10*3/uL — ABNORMAL HIGH (ref 150–400)
RBC: 3.93 MIL/uL — ABNORMAL LOW (ref 4.22–5.81)
RDW: 18.4 % — ABNORMAL HIGH (ref 11.5–15.5)
WBC: 16.8 10*3/uL — ABNORMAL HIGH (ref 4.0–10.5)
nRBC: 0.4 % — ABNORMAL HIGH (ref 0.0–0.2)

## 2022-01-26 LAB — AMYLASE, PLEURAL OR PERITONEAL FLUID: Amylase, Fluid: 26 U/L

## 2022-01-26 LAB — BASIC METABOLIC PANEL
Anion gap: 12 (ref 5–15)
BUN: 14 mg/dL (ref 6–20)
CO2: 26 mmol/L (ref 22–32)
Calcium: 9.2 mg/dL (ref 8.9–10.3)
Chloride: 99 mmol/L (ref 98–111)
Creatinine, Ser: 1.05 mg/dL (ref 0.61–1.24)
GFR, Estimated: >60 mL/min (ref >60)
Glucose, Bld: 122 mg/dL — ABNORMAL HIGH (ref 70–99)
Potassium: 4.1 mmol/L (ref 3.5–5.1)
Sodium: 137 mmol/L (ref 135–145)

## 2022-01-26 LAB — EXPECTORATED SPUTUM ASSESSMENT W GRAM STAIN, RFLX TO RESP C

## 2022-01-26 LAB — GLUCOSE, PLEURAL OR PERITONEAL FLUID: Glucose, Fluid: 45 mg/dL

## 2022-01-26 LAB — MAGNESIUM: Magnesium: 2.4 mg/dL (ref 1.7–2.4)

## 2022-01-26 LAB — CYTOLOGY - NON PAP

## 2022-01-26 MED ORDER — NICOTINE 21 MG/24HR TD PT24
21.0000 mg | MEDICATED_PATCH | Freq: Every day | TRANSDERMAL | Status: DC
  Administered 2022-01-26 – 2022-01-28 (×3): 21 mg via TRANSDERMAL
  Filled 2022-01-26 (×2): qty 1

## 2022-01-26 MED ORDER — OXYCODONE HCL 5 MG PO TABS
5.0000 mg | ORAL_TABLET | Freq: Four times a day (QID) | ORAL | Status: DC | PRN
  Administered 2022-01-26 – 2022-01-28 (×8): 5 mg via ORAL
  Filled 2022-01-26 (×7): qty 1

## 2022-01-26 MED ORDER — ACETAMINOPHEN 650 MG RE SUPP: 650.0000 mg | Freq: Four times a day (QID) | RECTAL | Status: DC | PRN

## 2022-01-26 MED ORDER — ACETAMINOPHEN 325 MG PO TABS
650.0000 mg | ORAL_TABLET | Freq: Four times a day (QID) | ORAL | Status: DC | PRN
  Administered 2022-01-26 – 2022-01-27 (×3): 650 mg via ORAL
  Filled 2022-01-26 (×3): qty 2

## 2022-01-26 MED ORDER — APIXABAN 5 MG PO TABS
5.0000 mg | ORAL_TABLET | Freq: Two times a day (BID) | ORAL | Status: DC
Start: 2022-01-30 — End: 2022-01-28

## 2022-01-26 MED ORDER — APIXABAN 5 MG PO TABS
10.0000 mg | ORAL_TABLET | Freq: Two times a day (BID) | ORAL | Status: DC
  Administered 2022-01-27 – 2022-01-28 (×3): 10 mg via ORAL
  Filled 2022-01-26 (×3): qty 2

## 2022-01-26 MED ORDER — AMOXICILLIN-POT CLAVULANATE 875-125 MG PO TABS
1.0000 | ORAL_TABLET | Freq: Two times a day (BID) | ORAL | Status: DC
  Administered 2022-01-27 – 2022-01-28 (×4): 1 via ORAL
  Filled 2022-01-26 (×4): qty 1

## 2022-01-26 NOTE — Progress Notes (Signed)
? ? ? ?Pulmonary Medicine  ? ? ? ? ? ? ? ? ?Date: 01/26/2022,   ?MRN# [id number] Mario Beck 1981/08/26 ? ? ?  ?AdmissionWeight: 88.5 kg                 ?CurrentWeight: 89.5 kg ? ? ? ? ? ?HISTORY OF PRESENT ILLNESS  ? ?He is more engaging, less dyspnea. No pleurisy. Loculated right pleural effusion, exudate, bloody, low glucose. Cultures neagtive, poor heart, recent pulm embolism on eliguis.  ? ?PAST MEDICAL HISTORY  ? ?Past Medical History:  ?Diagnosis Date  ? Alcohol abuse   ? Asthma   ? CHF (congestive heart failure) (HCC)   ? Depression   ? Hypertension   ? ? ? ?SURGICAL HISTORY  ? ?Past Surgical History:  ?Procedure Laterality Date  ? FRACTURE SURGERY    ? Left leg as a kid  ? RIGHT/LEFT HEART CATH AND CORONARY ANGIOGRAPHY N/A 01/21/2022  ? Procedure: RIGHT/LEFT HEART CATH AND CORONARY ANGIOGRAPHY;  Surgeon: Iran Ouch, MD;  Location: [facility] INVASIVE CV LAB;  Service: Cardiovascular;  Laterality: N/A;  ? ? ? ?FAMILY HISTORY  ? ?Family History  ?Problem Relation Age of Onset  ? Colon cancer Neg Hx   ? Esophageal cancer Neg Hx   ? ? ? ?SOCIAL HISTORY  ? ?Social History  ? ?Tobacco Use  ? Smoking status: Every Day  ?  Packs/day: 1.00  ?  Types: Cigarettes  ? Smokeless tobacco: Current  ?  Types: Snuff  ? Tobacco comments:  ?  Does pouches as welll   ?Vaping Use  ? Vaping Use: Never used  ?Substance Use Topics  ? Alcohol use: Not Currently  ?  Alcohol/week: 7.0 standard drinks  ?  Types: 7 Cans of beer per week  ? Drug use: No  ? ? ? ?MEDICATIONS  ? ? ?Home Medication:  ?  ?Current Medication: ? ?Current Facility-Administered Medications:  ?  0.9 %  sodium chloride infusion, 250 mL, Intravenous, Continuous, [name], [name], MD, Last Rate: 10 mL/hr at 01/23/22 1124, 250 mL at 01/23/22 1124 ?  acetaminophen (TYLENOL) tablet 650 mg, 650 mg, Oral, Q6H PRN **OR** acetaminophen (TYLENOL) suppository 650 mg, 650 mg, Rectal, Q6H PRN, [name], MD ?  [name] ON 01/27/2022] amoxicillin-clavulanate (AUGMENTIN) 875-125  MG per tablet 1 tablet, 1 tablet, Oral, Q12H, [name], MD ?  Ampicillin-Sulbactam (UNASYN) 3 g in sodium chloride 0.9 % 100 mL IVPB, 3 g, Intravenous, Q6H, [name], MD, Last Rate: 200 mL/hr at 01/26/22 1222, 3 g at 01/26/22 1222 ?  apixaban (ELIQUIS) tablet 10 mg, 10 mg, Oral, BID, 10 mg at 01/26/22 0934 **FOLLOWED BY** [START ON 01/28/2022] apixaban (ELIQUIS) tablet 5 mg, 5 mg, Oral, BID, [name], DO ?  carvedilol (COREG) tablet 3.125 mg, 3.125 mg, Oral, BID WC, [name], [name], MD, 3.125 mg at 01/26/22 8938 ?  dapagliflozin propanediol (FARXIGA) tablet 10 mg, 10 mg, Oral, Daily, [name], [name], PA-C, 10 mg at 01/26/22 1017 ?  digoxin (LANOXIN) tablet 0.125 mg, 0.125 mg, Oral, Daily, [name], [name], MD, 0.125 mg at 01/26/22 0936 ?  folic acid (FOLVITE) tablet 1 mg, 1 mg, Oral, Daily, [name], [name], MD, 1 mg at 01/26/22 5102 ?  furosemide (LASIX) tablet 40 mg, 40 mg, Oral, Daily, [name], [name], MD, 40 mg at 01/26/22 5852 ?  guaiFENesin (ROBITUSSIN) 100 MG/5ML liquid 5 mL, 5 mL, Oral, Q4H PRN, [name], DO, 5 mL at 01/26/22 0938 ?  hydrOXYzine (ATARAX) tablet 25 mg, 25 mg, Oral, TID PRN, Iran Ouch, MD, 25 mg at 01/26/22 0936 ?  lidocaine (LIDODERM) 5 % 1 patch, 1 patch, Transdermal, Q24H, Iran Ouch, MD, 1 patch at 01/26/22 0932 ?  losartan (COZAAR) tablet 12.5 mg, 12.5 mg, Oral, Daily, [name], [name], MD, 12.5 mg at 01/26/22 8676 ?  multivitamin with minerals tablet 1 tablet, 1 tablet, Oral, Daily, Iran Ouch, MD, 1 tablet at 01/26/22 7209 ?  nicotine (NICODERM CQ - dosed in mg/24 hours) patch 21 mg, 21 mg, Transdermal, Daily, [name], MD, 21 mg at 01/26/22 0932 ?  ondansetron (ZOFRAN) injection 4 mg, 4 mg, Intravenous, Q6H PRN, Iran Ouch, MD, 4 mg at 01/17/22 1705 ?  oxyCODONE (Oxy IR/ROXICODONE) immediate release tablet 5 mg, 5 mg, Oral, Q6H PRN, [name], MD, 5 mg at 01/26/22 0933 ?  polyethylene glycol (MIRALAX / GLYCOLAX) packet 17 g, 17  g, Oral, Daily PRN, [name], [name], MD ?  sodium chloride flush (NS) 0.9 % injection 3 mL, 3 mL, Intravenous, Q12H, [name], [name], MD, 3 mL at 01/26/22 4709 ?  spironolactone (ALDACTONE) tablet 25 mg, 25 mg, Oral, Daily, [name], [name], MD, 25 mg at 01/26/22 0934 ?  thiamine tablet 100 mg, 100 mg, Oral, Daily, [name], MD, 100 mg at 01/26/22 6283 ? ? ? ?ALLERGIES  ? ?Lisinopril and Shellfish allergy ? ? ? ? ?REVIEW OF SYSTEMS  ? ? ?Review of Systems: ? ?Gen:  Denies  fever, sweats, chills weigh loss  ?HEENT: Denies blurred vision, double vision, ear pain, eye pain, hearing loss, nose bleeds, sore throat ?Cardiac:  No dizziness, chest pain or heaviness, chest tightness,edema ?Resp:   Denies cough or sputum porduction, shortness of breath,wheezing, hemoptysis,  ?Gi: Denies swallowing difficulty, stomach pain, nausea or vomiting, diarrhea, constipation, bowel incontinence ?Gu:  Denies bladder incontinence, burning urine ?Ext:   Denies Joint pain, stiffness or swelling ?Skin: Denies  skin rash, easy bruising or bleeding or hives ?Endoc:  Denies polyuria, polydipsia , polyphagia or weight change ?Psych:   Denies depression, insomnia or hallucinations  ? ?Other:  All other systems negative ? ? ?VS: BP 105/65 (BP Location: Left Arm)   Pulse 83   Temp 98.5 ?F (36.9 ?C)   Resp 18   Ht 6\' 3"  (1.905 m)   Wt 89.5 kg   SpO2 95%   BMI 24.66 kg/m?   ? ? ? ?PHYSICAL EXAM  ? ? ?GENERAL:NAD, no fevers, chills, no weakness no fatigue ?HEAD: Normocephalic, atraumatic.  ?EYES: Pupils equal, round, reactive to light. Extraocular muscles intact. No scleral icterus.  ?MOUTH: Moist mucosal membrane. Dentition intact. No abscess noted.  ?EAR, NOSE, THROAT: Clear without exudates. No external lesions.  ?NECK: Supple. No thyromegaly. No nodules. No JVD.  ?PULMONARY: basal dullnuss r > l ?CARDIOVASCULAR: S1 and S2. Regular rate and rhythm. No murmurs, rubs, or gallops. No edema. Pedal pulses 2+ bilaterally.   ?GASTROINTESTINAL: Soft, nontender, nondistended. No masses. Positive bowel sounds. No hepatosplenomegaly.  ?MUSCULOSKELETAL: No swelling, clubbing, or edema. Range of motion full in all extremities.  ?NEUROLOGIC: Cranial nerves II through XII are intact. No gross focal neurological deficits. Sensation intact. Reflexes intact.  ?SKIN: No ulceration, lesions, rashes, or cyanosis. Skin warm and dry. Turgor intact.  ?PSYCHIATRIC: Mood, affect within normal limits. The patient is awake, alert and oriented x 3. Insight, judgment intact.  ? ? ?  ? ?IMAGING  ? ? ?DG Chest 2 View ? ?  Result Date: 01/17/2022 ?CLINICAL DATA:  41 year old male with history of shortness of breath and chest pain. EXAM: CHEST - 2 VIEW COMPARISON:  Chest x-ray 12/08/2021. FINDINGS: New moderate left pleural effusion. Atelectasis and/or consolidation in the base of the left lung. Right lung is clear. No right pleural effusion. No pneumothorax. No evidence of pulmonary edema. Heart size is normal. Upper mediastinal contours are within normal limits. IMPRESSION: 1. New moderate left pleural effusion with atelectasis and/or consolidation in the left lung base. Electronically Signed   By: Trudie Reed M.D.   On: 01/17/2022 09:43  ? ?CT Chest Wo Contrast ? ?Result Date: 01/17/2022 ?CLINICAL DATA:  Abnormal chest x-ray EXAM: CT CHEST WITHOUT CONTRAST TECHNIQUE: Multidetector CT imaging of the chest was performed following the standard protocol without IV contrast. RADIATION DOSE REDUCTION: This exam was performed according to the departmental dose-optimization program which includes automated exposure control, adjustment of the mA and/or kV according to patient size and/or use of iterative reconstruction technique. COMPARISON:  CT chest angio dated December 08, 2021 FINDINGS: Cardiovascular: Marked cardiomegaly. No pericardial effusion. Coronary artery calcifications of the LAD. Minimal calcified plaque of the thoracic aorta. Mediastinum/Nodes: Esophagus  and thyroid are unremarkable. New mildly enlarged mediastinal lymph nodes, likely reactive. Reference pre-vascular lymph node measuring 1.1 cm in short axis on series 3, image 57. Lungs/Pleura: Central airways are pat

## 2022-01-26 NOTE — Progress Notes (Signed)
? ?Progress Note ? ?Patient Name: Mario Beck ?Date of Encounter: 01/26/2022 ? ?[facility] Cardiologist: Yvonne Kendall, MD  ? ?Subjective  ? ?Sob is better, still with right sided chest pain with palpation and taking deep breath.  Denies edema. ? ?Inpatient Medications  ?  ?Scheduled Meds: ? [START ON 01/27/2022] amoxicillin-clavulanate  1 tablet Oral Q12H  ? [START ON 01/27/2022] apixaban  10 mg Oral BID  ? Followed by  ? [START ON 01/30/2022] apixaban  5 mg Oral BID  ? carvedilol  3.125 mg Oral BID WC  ? dapagliflozin propanediol  10 mg Oral Daily  ? digoxin  0.125 mg Oral Daily  ? folic acid  1 mg Oral Daily  ? furosemide  40 mg Oral Daily  ? lidocaine  1 patch Transdermal Q24H  ? losartan  12.5 mg Oral Daily  ? multivitamin with minerals  1 tablet Oral Daily  ? nicotine  21 mg Transdermal Daily  ? sodium chloride flush  3 mL Intravenous Q12H  ? spironolactone  25 mg Oral Daily  ? thiamine  100 mg Oral Daily  ? ?Continuous Infusions: ? sodium chloride 250 mL (01/23/22 1124)  ? ampicillin-sulbactam (UNASYN) IV 3 g (01/26/22 1222)  ? ?PRN Meds: ?acetaminophen **OR** acetaminophen, guaiFENesin, hydrOXYzine, ondansetron (ZOFRAN) IV, oxyCODONE, polyethylene glycol  ? ?Vital Signs  ?  ?Vitals:  ? 01/25/22 1934 01/26/22 0500 01/26/22 0501 01/26/22 0748  ?BP: (!) 106/58  126/83 105/65  ?Pulse: 91  (!) 105 83  ?Resp: 18  18 18   ?Temp: 98.9 ?F (37.2 ?C)  (!) 97.4 ?F (36.3 ?C) 98.5 ?F (36.9 ?C)  ?TempSrc: Oral     ?SpO2: 95%  92% 95%  ?Weight:  89.5 kg    ?Height:      ? ? ?Intake/Output Summary (Last 24 hours) at 01/26/2022 1404 ?Last data filed at 01/26/2022 1222 ?Gross per 24 hour  ?Intake 945 ml  ?Output 1600 ml  ?Net -655 ml  ? ? ?  01/26/2022  ?  5:00 AM 01/25/2022  ?  4:22 AM 01/23/2022  ?  5:00 AM  ?Last 3 Weights  ?Weight (lbs) 197 lb 5 oz 194 lb 0.1 oz 190 lb 14.7 oz  ?Weight (kg) 89.5 kg 88 kg 86.6 kg  ?   ? ?Telemetry  ?  ?Currently off telemetry- Personally Reviewed ? ?ECG  ?  ? - Personally Reviewed ? ?Physical  Exam  ? ?GEN: No acute distress.   ?Neck: No JVD ?Cardiac: RRR, no murmurs, rubs, or gallops.  ?Respiratory: Clear to auscultation bilaterally. ?GI: Soft, nontender, non-distended  ?MS: No edema; right chest wall tenderness with palpitations ?Neuro:  Nonfocal  ?Psych: Normal affect  ? ?Labs  ?  ?High Sensitivity Troponin:   ?Recent Labs  ?Lab 01/17/22 ?1932 01/17/22 ?2252 01/18/22 ?03/20/22 01/18/22 ?0755  ?TROPONINIHS 28* 29* 32* 39*  ?   ?Chemistry ?Recent Labs  ?Lab 01/23/22 ?0704 01/24/22 ?0404 01/25/22 ?0411 01/26/22 ?0443  ?NA 131* 137 137 137  ?K 3.9 4.3 4.3 4.1  ?CL 95* 99 100 99  ?CO2 29 28 28 26   ?GLUCOSE 183* 141* 125* 122*  ?BUN 12 16 15 14   ?CREATININE 0.88 1.03 1.13 1.05  ?CALCIUM 8.7* 9.1 8.8* 9.2  ?MG 1.8 2.2  --  2.4  ?GFRNONAA >60 >60 >60 >60  ?ANIONGAP 7 10 9 12   ?  ?Lipids No results for input(s): CHOL, TRIG, HDL, LABVLDL, LDLCALC, CHOLHDL in the last 168 hours.  ?Hematology ?Recent Labs  ?Lab 01/24/22 ?  4097 01/25/22 ?0411 01/26/22 ?3532  ?WBC 15.5* 14.7* 16.8*  ?RBC 3.20* 3.08* 3.93*  ?HGB 8.7* 8.3* 10.4*  ?HCT 26.1* 25.0* 32.0*  ?MCV 81.6 81.2 81.4  ?MCH 27.2 26.9 26.5  ?MCHC 33.3 33.2 32.5  ?RDW 18.2* 18.3* 18.4*  ?PLT 373 465* 628*  ? ?Thyroid No results for input(s): TSH, FREET4 in the last 168 hours.  ?BNPNo results for input(s): BNP, PROBNP in the last 168 hours.  ?DDimer  ?Recent Labs  ?Lab 01/19/22 ?1542  ?DDIMER 9.39*  ?  ? ?Radiology  ?  ?DG Chest Port 1 View ? ?Result Date: 01/25/2022 ?CLINICAL DATA:  Right-sided thoracentesis. EXAM: PORTABLE CHEST 1 VIEW COMPARISON:  January 24, 2022. FINDINGS: Similar right lung opacities. Moderate right pleural effusion. No visible pneumothorax status post reported thoracentesis. New left midlung opacity. Similar enlarged cardiac silhouette. IMPRESSION: 1. Similar right lung opacities.  New left midlung opacity. 2. Moderate right pleural effusion. No visible pneumothorax status post reported thoracentesis. 3. Similar cardiomegaly. Electronically Signed    By: Feliberto Harts M.D.   On: 01/25/2022 12:12  ? ?US THORACENTESIS ASP PLEURAL SPACE W/IMG GUIDE ? ?Result Date: 01/25/2022 ?INDICATION: Shortness of breath with a right-sided pleural effusion request received for diagnostic and therapeutic thoracentesis. EXAM: ULTRASOUND GUIDED RIGHT THORACENTESIS MEDICATIONS: Local 1% lidocaine only. COMPLICATIONS: None immediate. PROCEDURE: An ultrasound guided thoracentesis was thoroughly discussed with the patient and questions answered. The benefits, risks, alternatives and complications were also discussed. The patient understands and wishes to proceed with the procedure. Written consent was obtained. Ultrasound was performed to localize and mark an adequate pocket of fluid in the right chest. Pleural effusion is severely loculated. The area was then prepped and draped in the normal sterile fashion. 1% Lidocaine was used for local anesthesia. Under ultrasound guidance a 19 gauge, 7-cm, Yueh catheter was introduced. Thoracentesis was performed. The catheter was removed and a dressing applied. FINDINGS: A total of approximately 200 mL of blood-tinged fluid was removed. Right-sided pleural effusion is severely loculated. Samples were sent to the laboratory as requested by the clinical team. IMPRESSION: Successful ultrasound guided right thoracentesis yielding 200 mL of pleural fluid. This exam was performed by Pattricia Boss PA-C, and was supervised and interpreted by Dr. Juliette Alcide. Electronically Signed   By: Olive Bass M.D.   On: 01/25/2022 12:18   ? ?Cardiac Studies  ? ?TTE 01/2022 ?1. Left ventricular ejection fraction, by estimation, is 20 to 25%. The  ?left ventricle has severely decreased function. The left ventricle  ?demonstrates global hypokinesis. The left ventricular internal cavity size  ?was severely dilated. Indeterminate  ?diastolic filling due to E-A fusion.  ? 2. Right ventricular systolic function is moderately reduced. The right  ?ventricular size is mildly  enlarged.  ? 3. Left atrial size was mildly dilated.  ? 4. Right atrial size was mildly dilated.  ? 5. Moderate pleural effusion in the left lateral region.  ? 6. The mitral valve is normal in structure. Mild mitral valve  ?regurgitation.  ? 7. Tricuspid valve regurgitation is moderate.  ? 8. The aortic valve is tricuspid. Aortic valve regurgitation is not  ?visualized. No aortic stenosis is present.  ? ? ?LHC 01/2022 ?  Mid LAD lesion is 40% stenosed. ?  There is severe left ventricular systolic dysfunction. ?  LV end diastolic pressure is mildly elevated. ?  The left ventricular ejection fraction is less than 25% by visual estimate. ?  ?1.  Moderate one-vessel coronary artery disease with 40% stenosis in  the mid LAD.  No evidence of obstructive disease. ?2.  Severely reduced LV systolic function with an EF of 15%. ?3.  Right heart catheterization showed mildly elevated filling pressures, mild pulmonary hypertension and normal cardiac output. ? ?Patient Profile  ?   ?41 y.o. male cardiomyopathy EF 25%, smoker who presents with worsening shortness of breath and orthopnea.  Being seen for CHF exacerbation and volume overload. ? ?Assessment & Plan  ?  ?NICM EF 25% ?-Coreg 3.125 mg twice daily, losartan 25 mg daily. ?-Farxiga 10 mg daily ?-Lasix 40 mg daily. ?-Appears euvolemic ?-Titrate CHF meds as BP permits.  This can be accomplished as outpatient. ? ?2.  Pulmonary embolus ?-On Eliquis ?-possible reason for pleuritic chest pain ? ?3. Anemia ?-mgmt as per medicine team ?  ?Total encounter time more than 50 minutes ? Greater than 50% was spent in counseling and coordination of care with the patient ?   ?Signed, ?Debbe Odea, MD  ?01/26/2022, 2:04 PM    ?

## 2022-01-26 NOTE — Plan of Care (Addendum)
Patient is alert and oriented X 4 with no signs of pain or discomfort at this time. Last PRN med's were given @ 0507, please see MAR. Around 0445 this morning on 01/26/2022 patient left the facility to go outside to smoke a cigarette. Patient was then escorted back inside and to his room by by hospital security. Around 2030 on 01/25/2022 patient was given a sample cup to collect a sputum assessment. At this time patient has not given a sputum sample. I reminded patient again around 0500. Currently awaiting sample.  ? ? ?Problem: Education: ?Goal: Ability to demonstrate management of disease process will improve ?Outcome: Progressing ?  ?Problem: Education: ?Goal: Ability to verbalize understanding of medication therapies will improve ?Outcome: Progressing ?  ?Problem: Education: ?Goal: Individualized Educational Video(s) ?Outcome: Progressing ?  ?Problem: Activity: ?Goal: Capacity to carry out activities will improve ?Outcome: Progressing ?  ?Problem: Cardiac: ?Goal: Ability to achieve and maintain adequate cardiopulmonary perfusion will improve ?Outcome: Progressing ?  ?Problem: Education: ?Goal: Knowledge of General Education information will improve ?Description: Including pain rating scale, medication(s)/side effects and non-pharmacologic comfort measures ?Outcome: Progressing ?  ?Problem: Health Behavior/Discharge Planning: ?Goal: Ability to manage health-related needs will improve ?Outcome: Progressing ?  ?Problem: Clinical Measurements: ?Goal: Ability to maintain clinical measurements within normal limits will improve ?Outcome: Progressing ?  ?Problem: Clinical Measurements: ?Goal: Will remain free from infection ?Outcome: Progressing ?  ?Problem: Clinical Measurements: ?Goal: Diagnostic test results will improve ?Outcome: Progressing ?  ?Problem: Clinical Measurements: ?Goal: Respiratory complications will improve ?Outcome: Progressing ?  ?Problem: Clinical Measurements: ?Goal: Cardiovascular complication will  be avoided ?Outcome: Progressing ?  ?Problem: Elimination: ?Goal: Will not experience complications related to urinary retention ?Outcome: Progressing ?  ?Problem: Pain Managment: ?Goal: General experience of comfort will improve ?Outcome: Progressing ?  ?

## 2022-01-26 NOTE — Progress Notes (Signed)
[facility]  - [place] at [facility] ? ? ?PATIENT NAME: Mario Beck   ? ?MR#:  [id number] ? ?DATE OF BIRTH:  [birth year] ? ?SUBJECTIVE:  ? ?complains of pleuritic chest pain. Laying comfortably watching TV. No fever. No family at bedside. Eating well. ? ? ?VITALS:  ?Blood pressure 105/65, pulse 83, temperature 98.5 ?F (36.9 ?C), resp. rate 18, height 6\' 3"  (1.905 m), weight 89.5 kg, SpO2 95 %. ? ?PHYSICAL EXAMINATION:  ? ?GENERAL:  41 y.o.-year-old patient lying in the bed with no acute distress.  ?LUNGS: Normal breath sounds bilaterally, no wheezing, rales, rhonchi.  ?CARDIOVASCULAR: S1, S2 normal. No murmurs, rubs, or gallops.  ?ABDOMEN: Soft, nontender, nondistended. Bowel sounds present.  ?EXTREMITIES: No  edema b/l.    ?NEUROLOGIC: nonfocal  patient is alert and awake ?SKIN: No obvious rash, lesion, or ulcer.  ? ?LABORATORY PANEL:  ?CBC ?Recent Labs  ?Lab 01/26/22 ?0443  ?WBC 16.8*  ?HGB 10.4*  ?HCT 32.0*  ?PLT 628*  ? ? ?Chemistries  ?Recent Labs  ?Lab 01/26/22 ?0443  ?NA 137  ?K 4.1  ?CL 99  ?CO2 26  ?GLUCOSE 122*  ?BUN 14  ?CREATININE 1.05  ?CALCIUM 9.2  ?MG 2.4  ? ?Cardiac Enzymes ?No results for input(s): TROPONINI in the last 168 hours. ?RADIOLOGY:  ?DG Chest Port 1 View ? ?Result Date: 01/25/2022 ?CLINICAL DATA:  Right-sided thoracentesis. EXAM: PORTABLE CHEST 1 VIEW COMPARISON:  January 24, 2022. FINDINGS: Similar right lung opacities. Moderate right pleural effusion. No visible pneumothorax status post reported thoracentesis. New left midlung opacity. Similar enlarged cardiac silhouette. IMPRESSION: 1. Similar right lung opacities.  New left midlung opacity. 2. Moderate right pleural effusion. No visible pneumothorax status post reported thoracentesis. 3. Similar cardiomegaly. Electronically Signed   By: January 26, 2022 M.D.   On: 01/25/2022 12:12  ? ?03/27/2022 THORACENTESIS ASP PLEURAL SPACE W/IMG GUIDE ? ?Result Date: 01/25/2022 ?INDICATION: Shortness of breath with a right-sided pleural  effusion request received for diagnostic and therapeutic thoracentesis. EXAM: ULTRASOUND GUIDED RIGHT THORACENTESIS MEDICATIONS: Local 1% lidocaine only. COMPLICATIONS: None immediate. PROCEDURE: An ultrasound guided thoracentesis was thoroughly discussed with the patient and questions answered. The benefits, risks, alternatives and complications were also discussed. The patient understands and wishes to proceed with the procedure. Written consent was obtained. Ultrasound was performed to localize and mark an adequate pocket of fluid in the right chest. Pleural effusion is severely loculated. The area was then prepped and draped in the normal sterile fashion. 1% Lidocaine was used for local anesthesia. Under ultrasound guidance a 19 gauge, 7-cm, Yueh catheter was introduced. Thoracentesis was performed. The catheter was removed and a dressing applied. FINDINGS: A total of approximately 200 mL of blood-tinged fluid was removed. Right-sided pleural effusion is severely loculated. Samples were sent to the laboratory as requested by the clinical team. IMPRESSION: Successful ultrasound guided right thoracentesis yielding 200 mL of pleural fluid. This exam was performed by 03/27/2022 PA-C, and was supervised and interpreted by Dr. Pattricia Boss. Electronically Signed   By: Juliette Alcide M.D.   On: 01/25/2022 12:18   ? ?Assessment and Plan ? ?HPI taken from ICU notes: ?"41 yo male with a history of cardiomyopathy presented to the [facility] ER on 4/3 complaining of 3-4 days of dyspnea, fatigue and weight gain. He says that he has been compliant with his medications which include furosemide, metoprolol, nexium, potassium and sertraline.  However he says that the lasix has not been working for him in the last week or so  and his urine output has become very minimal and dark.  He has noted increasing dyspnea when he climbs a flight of stairs in his apartment and yesterday passed out twice when he was walking across the parking lot.    ?He was admitted to [facility] for alcohol detox on 3/21, treated with lithium, and discharged on 3/24.  He says that he has been compliant with his medications. ? ?Found to have acute PE on the right with probable pulmonary infarct.  Started on heparin drip, then transitioned to Eliquis. ?  ?4/10: despite several days Cefepime, WBC increasing.  Repeat CXR with worsened R-sided infiltrates and parapneumonia effusion.   ?4/11: d/w Dr. Meredeth Ide and plans for CT guided right sided chest tube placement for possible early empyema ?  ?Acute on chronic HFrEF (heart failure with reduced ejection fraction) (HCC) ?hypertension ?Admitted to ICU with cardiogenic shock 4/3.  Presented with worsening dyspnea and orthopnea, weight gain, elevated BNP 2253, and bilateral pleural effusions. ?Most recent echo with EF less than 20% with moderate to severe MR. ?Right and left cardiac cath on 4/7 without significant CAD, findings consistent with nonischemic cardiomyopathy (see report, or conclusion below). ?-- Management per cardiology ?-- continue Coreg, digoxin, Lasix 40 mg daily, losartan, spironolactone ? ?cardiogenic shock ?-- resolved ? ?left-sided pleural effusion underwent thoracentesis on 4/4 with 850 mL removed. Fluid culture negative cytology negative for malignancy ? ?Right-sided pleural effusion with CAP ?-- underwent ultrasound-guided thoracentesis with 200 mL of blood-tinged fluid, exudate, culture so far negative ?-- Dr. Meredeth Ide recommends CT guided chest tube placement. IR to do tomorrow ?-- continue IV unasyn ? ?Alcohol use disorder, moderate, dependence (HCC) ?Last drink 8 days prior to admission, recently detoxed in [place] and had been at a rehab facility as well.   ?Denies any alcohol consumption since.  No signs of withdrawal.   ?Stop CIWA.  Stable ? ?Tobacco abuse counseling ?Counseled regarding importance of cessation.  Nicotine patch ordered. ?-- Patient continues to go out smoking despite discussing  with him. ?-- Discussed with patient per hospital policy he is not allowed and if he does not follow policy will have to consider discharge. Teacher, adult education present) ? ? ?Procedures: ?Family communication : patient states he will inform his girlfriend. ?Consults : pulmonary, cardiology ?CODE STATUS: full ?DVT Prophylaxis : eliquis ?Level of care: Med-Surg ?Status is: Inpatient ?Remains inpatient appropriate because: w/u sided pleural effusion ?  ?anticipate discharge 1 to 2 days. TOC for discharge planning ? ?TOTAL TIME TAKING CARE OF THIS PATIENT: 25 minutes.  ?>50% time spent on counselling and coordination of care ? ?Note: This dictation was prepared with Dragon dictation along with smaller phrase technology. Any transcriptional errors that result from this process are unintentional. ? ?[name] M.D  ? ? ?[facility]  ? ?CC: ?Primary care physician; Pcp, No  ?

## 2022-01-26 NOTE — Progress Notes (Signed)
Met with the patient and the girl friend in the room ?He was provided with the [facility] application and asked to call for an appointment ?He will get all non narcotic meds at [facility] and other meds at [facility] on [address], he has transportation with his girlfriend ?He is independent at home and has no additional needs ?

## 2022-01-26 NOTE — Consult Note (Signed)
? ?Chief Complaint: ?Patient was seen in consultation today for loculated right pleural effusion at the request of [name], MD ? ?Referring Physician(s): ?[name], MD ? ?Supervising Physician: Ruel Favors ? ?Patient Status: [facility] - In-pt ? ?History of Present Illness: ?Mario Beck is a 41 y.o. 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 Patient found to have AKI and acute on chronic heart failure with right sided pleural effusion and small pulmonary embolism. Patient underwent right sided thoracentesis 4/11 which only yielded 200 mL of fluid given collection was severely loculated. Fluid studies Cx no growth and fluid cloudy with low glucose concern for possible empyema or complicated effusion. IR received request for consult and chest tube. The patient states he did have some improvement in his shortness of breath post thoracentesis. He has no known complications to sedation and is agreeable to proceed with chest tube placement.  ? ?Past Medical History:  ?Diagnosis Date  ? Alcohol abuse   ? Asthma   ? CHF (congestive heart failure) (HCC)   ? Depression   ? Hypertension   ? ? ?Past Surgical History:  ?Procedure Laterality Date  ? FRACTURE SURGERY    ? Left leg as a kid  ? RIGHT/LEFT HEART CATH AND CORONARY ANGIOGRAPHY N/A 01/21/2022  ? Procedure: RIGHT/LEFT HEART CATH AND CORONARY ANGIOGRAPHY;  Surgeon: Iran Ouch, MD;  Location: [facility] INVASIVE CV LAB;  Service: Cardiovascular;  Laterality: N/A;  ? ? ?Allergies: ?Lisinopril and Shellfish allergy ? ?Medications: ?Prior to Admission medications   ?Medication Sig Start Date End Date Taking? Authorizing Provider  ?furosemide (LASIX) 40 MG tablet Take 1 tablet (40 mg total) by mouth once daily. 12/08/21 03/08/22 Yes Gilles Chiquito, MD  ?hydrALAZINE (APRESOLINE) 25 MG tablet Take 1 tablet (25 mg total) by mouth 3 (three) times  daily. 06/21/21 06/21/22 Yes Danford, Earl Lites, MD  ?metoprolol succinate (TOPROL-XL) 100 MG 24 hr tablet Take 1 tablet (100 mg total) by mouth once daily. (Take with or immediately following a meal). 06/22/21  Yes Danford, Earl Lites, MD  ?Multiple Vitamin (MULTIVITAMIN WITH MINERALS) TABS tablet Take 1 tablet by mouth daily. 05/30/21  Yes Arnetha Courser, MD  ?potassium chloride SA (KLOR-CON M) 20 MEQ tablet Take 2 tablets (40 mEq total) by mouth once daily. 12/08/21  Yes Gilles Chiquito, MD  ?albuterol (VENTOLIN HFA) 108 (90 Base) MCG/ACT inhaler Inhale 2 puffs into the lungs once every 6 (six) hours as needed for wheezing or shortness of breath. ?Patient not taking: Reported on 01/17/2022 06/21/21   Alberteen Sam, MD  ?pantoprazole (PROTONIX) 40 MG tablet Take 1 tablet (40 mg total) by mouth daily. 12/08/21 01/07/22  Gilles Chiquito, MD  ?  ? ?Family History  ?Problem Relation Age of Onset  ? Colon cancer Neg Hx   ? Esophageal cancer Neg Hx   ? ? ?Social History  ? ?Socioeconomic History  ? Marital status: Legally Separated  ?  Spouse name: Not on file  ? Number of children: 2  ? Years of education: Not on file  ? Highest education level: Not on file  ?Occupational History  ? Occupation: Production Management  ?Tobacco Use  ? Smoking status: Every Day  ?  Packs/day: 1.00  ?  Types: Cigarettes  ? Smokeless tobacco: Current  ?  Types: Snuff  ? Tobacco comments:  ?  Does pouches as welll   ?Vaping  Use  ? Vaping Use: Never used  ?Substance and Sexual Activity  ? Alcohol use: Not Currently  ?  Alcohol/week: 7.0 standard drinks  ?  Types: 7 Cans of beer per week  ? Drug use: No  ? Sexual activity: Yes  ?Other Topics Concern  ? Not on file  ?Social History Narrative  ? Not on file  ? ?Social Determinants of Health  ? ?Financial Resource Strain: Not on file  ?Food Insecurity: Not on file  ?Transportation Needs: Not on file  ?Physical Activity: Not on file  ?Stress: Not on file  ?Social Connections: Not on file   ? ? ?Review of Systems: A 12 point ROS discussed and pertinent positives are indicated in the HPI above.  All other systems are negative. ? ?Review of Systems ? ?Vital Signs: ?BP 105/65 (BP Location: Left Arm)   Pulse 83   Temp 98.5 ?F (36.9 ?C)   Resp 18   Ht 6\' 3"  (1.905 m)   Wt 197 lb 5 oz (89.5 kg)   SpO2 95%   BMI 24.66 kg/m?  ? ?Physical Exam ?Constitutional:   ?   General: He is not in acute distress. ?HENT:  ?   Head: Normocephalic and atraumatic.  ?Cardiovascular:  ?   Rate and Rhythm: Normal rate and regular rhythm.  ?Pulmonary:  ?   Effort: Pulmonary effort is normal. No respiratory distress.  ?   Comments: Diminished BS right base ?Skin: ?   General: Skin is warm and dry.  ?Neurological:  ?   Mental Status: He is alert and oriented to person, place, and time.  ? ? ?Imaging: ?DG Chest 2 View ? ?Result Date: 01/17/2022 ?CLINICAL DATA:  41 year old male with history of shortness of breath and chest pain. EXAM: CHEST - 2 VIEW COMPARISON:  Chest x-ray 12/08/2021. FINDINGS: New moderate left pleural effusion. Atelectasis and/or consolidation in the base of the left lung. Right lung is clear. No right pleural effusion. No pneumothorax. No evidence of pulmonary edema. Heart size is normal. Upper mediastinal contours are within normal limits. IMPRESSION: 1. New moderate left pleural effusion with atelectasis and/or consolidation in the left lung base. Electronically Signed   By: 12/10/2021 M.D.   On: 01/17/2022 09:43  ? ?CT Chest Wo Contrast ? ?Result Date: 01/17/2022 ?CLINICAL DATA:  Abnormal chest x-ray EXAM: CT CHEST WITHOUT CONTRAST TECHNIQUE: Multidetector CT imaging of the chest was performed following the standard protocol without IV contrast. RADIATION DOSE REDUCTION: This exam was performed according to the departmental dose-optimization program which includes automated exposure control, adjustment of the mA and/or kV according to patient size and/or use of iterative reconstruction technique.  COMPARISON:  CT chest angio dated December 08, 2021 FINDINGS: Cardiovascular: Marked cardiomegaly. No pericardial effusion. Coronary artery calcifications of the LAD. Minimal calcified plaque of the thoracic aorta. Mediastinum/Nodes: Esophagus and thyroid are unremarkable. New mildly enlarged mediastinal lymph nodes, likely reactive. Reference pre-vascular lymph node measuring 1.1 cm in short axis on series 3, image 57. Lungs/Pleura: Central airways are patent. Mild paraseptal emphysema. Peripheral predominant ground-glass opacities of the lower lungs. Small left pleural effusion with atelectasis. Bilateral pulmonary nodules-stable in size when compared with prior exam dating back to June 17, 2021. Largest solid nodule measures 7.8 mm in mean diameter and is located in the right upper lobe on series 4 image 46 and is part of a linear cluster of additional nodules Upper Abdomen: No acute abnormality. Musculoskeletal: No chest wall mass or suspicious bone lesions identified.  IMPRESSION: 1. New peripheral and lower lung predominant patchy ground-glass opacities, findings can be seen in the setting of multifocal infection, including COVID-19. 2. New small left pleural effusion with atelectasis. 3. New mildly enlarged mediastinal lymph nodes, likely reactive. 4. Bilateral solid pulmonary nodules, largest measures 7.8 mm in mean diameter and is located in the right upper lobe. Nodules have been stable when compared with prior exam dated back to June 17, 2021, but are new when compared with more remote priors. Additional CT follow-up at 18-24 months (from today's scan) is considered optional for low-risk patients, but is recommended for high-risk patients. This recommendation follows the consensus statement: Guidelines for Management of Incidental Pulmonary Nodules Detected on CT Images: From the Fleischner Society 2017; Radiology 2017; 284:228-243. 5. Marked cardiomegaly and Emphysema (ICD10-J43.9). Electronically  Signed   By: Allegra Lai M.D.   On: 01/17/2022 12:09  ? ?CT Angio Chest Pulmonary Embolism (PE) W or WO Contrast ? ?Result Date: 01/19/2022 ?CLINICAL DATA:  Pleuritic chest pain, shortness of breath EXAM: CT ANGIOGR

## 2022-01-27 ENCOUNTER — Inpatient Hospital Stay: Payer: Medicaid Other

## 2022-01-27 MED ORDER — MIDAZOLAM HCL 2 MG/2ML IJ SOLN
INTRAMUSCULAR | Status: AC
  Filled 2022-01-27: qty 2

## 2022-01-27 MED ORDER — MIDAZOLAM HCL 5 MG/5ML IJ SOLN
INTRAMUSCULAR | Status: AC | PRN
  Administered 2022-01-27: 1 mg via INTRAVENOUS

## 2022-01-27 MED ORDER — FENTANYL CITRATE (PF) 100 MCG/2ML IJ SOLN
INTRAMUSCULAR | Status: AC
  Filled 2022-01-27: qty 2

## 2022-01-27 MED ORDER — FENTANYL CITRATE (PF) 100 MCG/2ML IJ SOLN
INTRAMUSCULAR | Status: AC | PRN
  Administered 2022-01-27: 50 ug via INTRAVENOUS

## 2022-01-27 MED ORDER — ATORVASTATIN CALCIUM 10 MG PO TABS
10.0000 mg | ORAL_TABLET | Freq: Every day | ORAL | Status: DC
  Administered 2022-01-27 – 2022-01-28 (×2): 10 mg via ORAL
  Filled 2022-01-27 (×2): qty 1

## 2022-01-27 MED ORDER — HYDROMORPHONE HCL 1 MG/ML IJ SOLN
2.0000 mg | Freq: Four times a day (QID) | INTRAMUSCULAR | Status: DC | PRN
  Administered 2022-01-27 – 2022-01-28 (×5): 2 mg via INTRAVENOUS
  Filled 2022-01-27 (×6): qty 2

## 2022-01-27 MED ORDER — MIDAZOLAM HCL 2 MG/2ML IJ SOLN
INTRAMUSCULAR | Status: AC | PRN
  Administered 2022-01-27: 1 mg via INTRAVENOUS

## 2022-01-27 MED ORDER — MORPHINE SULFATE (PF) 2 MG/ML IV SOLN
2.0000 mg | INTRAVENOUS | Status: DC | PRN
  Administered 2022-01-27: 2 mg via INTRAVENOUS
  Filled 2022-01-27: qty 1

## 2022-01-27 NOTE — Plan of Care (Signed)
?  Problem: Education: ?Goal: Ability to verbalize understanding of medication therapies will improve ?Outcome: Progressing ?  ?Problem: Education: ?Goal: Ability to demonstrate management of disease process will improve ?Outcome: Progressing ?  ?Problem: Activity: ?Goal: Capacity to carry out activities will improve ?Outcome: Progressing ?  ?Problem: Cardiac: ?Goal: Ability to achieve and maintain adequate cardiopulmonary perfusion will improve ?Outcome: Progressing ?  ?Problem: Education: ?Goal: Knowledge of General Education information will improve ?Description: Including pain rating scale, medication(s)/side effects and non-pharmacologic comfort measures ?Outcome: Progressing ?  ?Problem: Health Behavior/Discharge Planning: ?Goal: Ability to manage health-related needs will improve ?Outcome: Progressing ?  ?Problem: Clinical Measurements: ?Goal: Respiratory complications will improve ?Outcome: Progressing ?  ?Problem: Activity: ?Goal: Risk for activity intolerance will decrease ?Outcome: Progressing ?  ?Problem: Nutrition: ?Goal: Adequate nutrition will be maintained ?Outcome: Progressing ?  ?Problem: Coping: ?Goal: Level of anxiety will decrease ?Outcome: Progressing ?  ?Problem: Elimination: ?Goal: Will not experience complications related to urinary retention ?Outcome: Progressing ?  ?Problem: Pain Managment: ?Goal: General experience of comfort will improve ?Outcome: Progressing ?  ?Problem: Safety: ?Goal: Ability to remain free from injury will improve ?Outcome: Progressing ?  ?Problem: Skin Integrity: ?Goal: Risk for impaired skin integrity will decrease ?Outcome: Progressing ?  ?

## 2022-01-27 NOTE — Progress Notes (Signed)
[facility]  - [place] at [facility], [place] ? ? ?PATIENT NAME: Mario Beck   ? ?MR#:  [id number] ? ?DATE OF BIRTH:  02-Dec-1980 ? ?SUBJECTIVE:  ?Laying comfortably. NPO for pleural drain placement ? ?VITALS:  ?Blood pressure 124/82, pulse 90, temperature 97.6 ?F (36.4 ?C), temperature source Oral, resp. rate 18, height 6\' 3"  (1.905 m), weight 89.3 kg, SpO2 98 %. ? ?PHYSICAL EXAMINATION:  ? ?GENERAL:  41 y.o.-year-old patient lying in the bed with no acute distress.  ?LUNGS: Normal breath sounds bilaterally, no wheezing, rales, rhonchi.  ?CARDIOVASCULAR: S1, S2 normal. No murmurs, rubs, or gallops.  ?ABDOMEN: Soft, nontender, nondistended. Bowel sounds present.  ?EXTREMITIES: No  edema b/l.    ?NEUROLOGIC: nonfocal  patient is alert and awake ?SKIN: No obvious rash, lesion, or ulcer.  ? ?LABORATORY PANEL:  ?CBC ?Recent Labs  ?Lab 01/26/22 ?0443  ?WBC 16.8*  ?HGB 10.4*  ?HCT 32.0*  ?PLT 628*  ? ? ? ?Chemistries  ?Recent Labs  ?Lab 01/26/22 ?0443  ?NA 137  ?K 4.1  ?CL 99  ?CO2 26  ?GLUCOSE 122*  ?BUN 14  ?CREATININE 1.05  ?CALCIUM 9.2  ?MG 2.4  ? ? ?Cardiac Enzymes ?No results for input(s): TROPONINI in the last 168 hours. ?RADIOLOGY:  ?No results found. ? ?Assessment and Plan ? ?HPI taken from ICU notes: ?"41 yo male with a history of cardiomyopathy presented to the [facility] ER on 4/3 complaining of 3-4 days of dyspnea, fatigue and weight gain. He says that he has been compliant with his medications which include furosemide, metoprolol, nexium, potassium and sertraline.  However he says that the lasix has not been working for him in the last week or so and his urine output has become very minimal and dark.  He has noted increasing dyspnea when he climbs a flight of stairs in his apartment and yesterday passed out twice when he was walking across the parking lot.   ?He was admitted to [facility] for alcohol detox on 3/21, treated with lithium, and discharged on 3/24.  He says that he has been compliant with  his medications. ? ?Found to have acute PE on the right with probable pulmonary infarct.  Started on heparin drip, then transitioned to Eliquis. ?  ?4/10: despite several days Cefepime, WBC increasing.  Repeat CXR with worsened R-sided infiltrates and parapneumonia effusion.   ?4/11: d/w Dr. 6/11 and plans for CT guided right sided chest tube placement for possible early empyema ? ?4/13: s/p right sided CT guided pleural drain placement for complex effusion ?  ?Acute on chronic HFrEF (heart failure with reduced ejection fraction) (HCC) ?hypertension ?Admitted to ICU with cardiogenic shock 4/3.  Presented with worsening dyspnea and orthopnea, weight gain, elevated BNP 2253, and bilateral pleural effusions. ?Most recent echo with EF less than 20% with moderate to severe MR. ?Right and left cardiac cath on 4/7 without significant CAD, findings consistent with nonischemic cardiomyopathy (see report, or conclusion below). ?-- Management per cardiology ?--continue Coreg,digoxin, Lasix 40 mg daily, losartan, spironolactone ? ?cardiogenic shock ?-- resolved ? ?Left-sided pleural effusion underwent thoracentesis on 4/4 with 850 mL removed. Fluid culture negative cytology negative for malignancy ? ?Right-sided pleural effusion with CAP ?-- underwent ultrasound-guided thoracentesis with 200 mL of blood-tinged fluid, exudate, culture so far negative ?-- Dr. 6/4 recommends CT guided chest tube placement. IR to do tomorrow ?-- continue IV unasyn--changed to po augmentin ?-s/p CT guided right pleural drain placement ? ?Alcohol use disorder, moderate, dependence (HCC) ?-Last drink  8 days prior to admission, recently detoxed in [place] and had been at a rehab facility as well.   ?-Denies any alcohol consumption since.  No signs of withdrawal.   ?Stopped CIWA.  Stable ? ?Tobacco abuse counseling ?Counseled regarding importance of cessation.  Nicotine patch ordered. ?-- Patient continues to go out smoking despite discussing  with him. ?-- Discussed with patient per hospital policy he is not allowed and if he does not follow policy will have to consider discharge. Teacher, adult education present) d/w [facility] ? ? ? ?Family communication : patient states he will inform his girlfriend. ?Consults : pulmonary, cardiology ?CODE STATUS: full ?DVT Prophylaxis : eliquis ?Level of care: Med-Surg ?Status is: Inpatient ?Remains inpatient appropriate because: w/u sided pleural effusion ?  ?anticipate discharge 1-3 days  ? ?TOTAL TIME TAKING CARE OF THIS PATIENT: 25 minutes.  ?>50% time spent on counselling and coordination of care ? ?Note: This dictation was prepared with Dragon dictation along with smaller phrase technology. Any transcriptional errors that result from this process are unintentional. ? ?[name] M.D  ? ? ?[facility]  ? ?CC: ?Primary care physician; Pcp, No  ?

## 2022-01-27 NOTE — Progress Notes (Signed)
Received pt back from IR with right chest tube in place..20cc of  serosanguineous drainage was present in chamber on arrival to unit. Dressing dry and intact.  ?

## 2022-01-27 NOTE — Progress Notes (Signed)
? ?Cardiology Progress Note  ? ?Patient Name: Mario Beck ?Date of Encounter: 01/27/2022 ? ?Primary Cardiologist: Yvonne Kendall, MD ? ?Subjective  ? ?S/p R sided chest tube yesterday.  Area is very tender.  Otw feeling well.  Breathing improved.   ? ?Inpatient Medications  ?  ?Scheduled Meds: ? amoxicillin-clavulanate  1 tablet Oral Q12H  ? apixaban  10 mg Oral BID  ? Followed by  ? [START ON 01/30/2022] apixaban  5 mg Oral BID  ? carvedilol  3.125 mg Oral BID WC  ? dapagliflozin propanediol  10 mg Oral Daily  ? digoxin  0.125 mg Oral Daily  ? fentaNYL      ? folic acid  1 mg Oral Daily  ? furosemide  40 mg Oral Daily  ? lidocaine  1 patch Transdermal Q24H  ? losartan  12.5 mg Oral Daily  ? midazolam      ? multivitamin with minerals  1 tablet Oral Daily  ? nicotine  21 mg Transdermal Daily  ? sodium chloride flush  3 mL Intravenous Q12H  ? spironolactone  25 mg Oral Daily  ? thiamine  100 mg Oral Daily  ? ?Continuous Infusions: ? sodium chloride 250 mL (01/23/22 1124)  ? ?PRN Meds: ?acetaminophen **OR** acetaminophen, guaiFENesin, hydrOXYzine, ondansetron (ZOFRAN) IV, oxyCODONE, polyethylene glycol  ? ?Vital Signs  ?  ?Vitals:  ? 01/27/22 1145 01/27/22 1152 01/27/22 1210 01/27/22 1217  ?BP: 120/76 122/78 120/74 124/82  ?Pulse: 86 85 84 90  ?Resp: 20 20 20 18   ?Temp:    97.6 ?F (36.4 ?C)  ?TempSrc:    Oral  ?SpO2: 98% 96% 98% 98%  ?Weight:      ?Height:      ? ? ?Intake/Output Summary (Last 24 hours) at 01/27/2022 1235 ?Last data filed at 01/26/2022 2350 ?Gross per 24 hour  ?Intake --  ?Output 1000 ml  ?Net -1000 ml  ? ?Filed Weights  ? 01/25/22 0422 01/26/22 0500 01/27/22 0539  ?Weight: 88 kg 89.5 kg 89.3 kg  ? ? ?Physical Exam  ? ?GEN: Well nourished, well developed, in no acute distress.  ?HEENT: Grossly normal.  ?Neck: Supple, no JVD, carotid bruits, or masses. ?Cardiac: RRR, no murmurs, rubs, or gallops. No clubbing, cyanosis, edema.  Radials 2+, DP/PT 2+ and equal bilaterally.  R chest wall is tender - chest  tube in place. ?Respiratory:  Respirations regular and unlabored, diminished @ right base. ?GI: Soft, nontender, nondistended, BS + x 4. ?MS: no deformity or atrophy. ?Skin: warm and dry, no rash. ?Neuro:  Strength and sensation are intact. ?Psych: AAOx3.  Normal affect. ? ?Labs  ?  ?Chemistry ?Recent Labs  ?Lab 01/24/22 ?0404 01/25/22 ?0411 01/26/22 ?0443  ?NA 137 137 137  ?K 4.3 4.3 4.1  ?CL 99 100 99  ?CO2 28 28 26   ?GLUCOSE 141* 125* 122*  ?BUN 16 15 14   ?CREATININE 1.03 1.13 1.05  ?CALCIUM 9.1 8.8* 9.2  ?GFRNONAA >60 >60 >60  ?ANIONGAP 10 9 12   ?  ? ?Hematology ?Recent Labs  ?Lab 01/24/22 ?0404 01/25/22 ?0411 01/26/22 ?0443  ?WBC 15.5* 14.7* 16.8*  ?RBC 3.20* 3.08* 3.93*  ?HGB 8.7* 8.3* 10.4*  ?HCT 26.1* 25.0* 32.0*  ?MCV 81.6 81.2 81.4  ?MCH 27.2 26.9 26.5  ?MCHC 33.3 33.2 32.5  ?RDW 18.2* 18.3* 18.4*  ?PLT 373 465* 628*  ? ? ?Cardiac Enzymes  ?Recent Labs  ?Lab 01/17/22 ?1932 01/17/22 ?2252 01/18/22 ?03/28/22 01/18/22 ?0755  ?TROPONINIHS 28* 29* 32* 39*  ?   ? ?  BNP ?   ?Component Value Date/Time  ? BNP 2,253.2 (H) 01/17/2022 8938  ? ?Lipids  ?Lab Results  ?Component Value Date  ? CHOL 232 (H) 09/17/2020  ? HDL 134 09/17/2020  ? LDLCALC 65 09/17/2020  ? TRIG 165 (H) 09/17/2020  ? CHOLHDL 1.7 09/17/2020  ? ? ?HbA1c  ?Lab Results  ?Component Value Date  ? HGBA1C 6.0 (H) 09/17/2020  ? ? ?Radiology  ?  ?DG Chest Port 1 View ? ?Result Date: 01/25/2022 ?CLINICAL DATA:  Right-sided thoracentesis. EXAM: PORTABLE CHEST 1 VIEW COMPARISON:  January 24, 2022. FINDINGS: Similar right lung opacities. Moderate right pleural effusion. No visible pneumothorax status post reported thoracentesis. New left midlung opacity. Similar enlarged cardiac silhouette. IMPRESSION: 1. Similar right lung opacities.  New left midlung opacity. 2. Moderate right pleural effusion. No visible pneumothorax status post reported thoracentesis. 3. Similar cardiomegaly. Electronically Signed   By: Feliberto Harts M.D.   On: 01/25/2022 12:12  ? ?DG Chest  Port 1 View ? ?Result Date: 01/24/2022 ?CLINICAL DATA:  41 year old male with history of shortness of breath. Worsening leukocytosis. EXAM: PORTABLE CHEST 1 VIEW COMPARISON:  Chest x-ray 01/18/2022. FINDINGS: Increasing airspace consolidation throughout the right mid to lower lung with enlarging moderate to large right parapneumonic pleural effusion. Left lung is clear. No left pleural effusion. No pneumothorax. No evidence of pulmonary edema. Heart size is moderately enlarged. Upper mediastinal contours are within normal limits. IMPRESSION: 1. Worsening multilobar pneumonia in the right lung with enlarging moderate right parapneumonic pleural effusion. 2. Moderate cardiomegaly. Electronically Signed   By: Trudie Reed M.D.   On: 01/24/2022 09:02  ? ?US THORACENTESIS ASP PLEURAL SPACE W/IMG GUIDE ? ?Result Date: 01/25/2022 ?INDICATION: Shortness of breath with a right-sided pleural effusion request received for diagnostic and therapeutic thoracentesis. EXAM: ULTRASOUND GUIDED RIGHT THORACENTESIS MEDICATIONS: Local 1% lidocaine only. COMPLICATIONS: None immediate. PROCEDURE: An ultrasound guided thoracentesis was thoroughly discussed with the patient and questions answered. The benefits, risks, alternatives and complications were also discussed. The patient understands and wishes to proceed with the procedure. Written consent was obtained. Ultrasound was performed to localize and mark an adequate pocket of fluid in the right chest. Pleural effusion is severely loculated. The area was then prepped and draped in the normal sterile fashion. 1% Lidocaine was used for local anesthesia. Under ultrasound guidance a 19 gauge, 7-cm, Yueh catheter was introduced. Thoracentesis was performed. The catheter was removed and a dressing applied. FINDINGS: A total of approximately 200 mL of blood-tinged fluid was removed. Right-sided pleural effusion is severely loculated. Samples were sent to the laboratory as requested by the  clinical team. IMPRESSION: Successful ultrasound guided right thoracentesis yielding 200 mL of pleural fluid. This exam was performed by Pattricia Boss PA-C, and was supervised and interpreted by Dr. Juliette Alcide. Electronically Signed   By: Olive Bass M.D.   On: 01/25/2022 12:18   ? ?Telemetry  ? ?Non-tele ? ?Cardiac Studies  ? ?2D echo 01/18/2022: ?1. Left ventricular ejection fraction, by estimation, is 20 to 25%. The  ?left ventricle has severely decreased function. The left ventricle  ?demonstrates global hypokinesis. The left ventricular internal cavity size  ?was severely dilated. Indeterminate  ?diastolic filling due to E-A fusion.  ? 2. Right ventricular systolic function is moderately reduced. The right  ?ventricular size is mildly enlarged.  ? 3. Left atrial size was mildly dilated.  ? 4. Right atrial size was mildly dilated.  ? 5. Moderate pleural effusion in the left lateral region.  ?  6. The mitral valve is normal in structure. Mild mitral valve  ?regurgitation.  ? 7. Tricuspid valve regurgitation is moderate.  ? 8. The aortic valve is tricuspid. Aortic valve regurgitation is not  ?visualized. No aortic stenosis is present. ?_____________  ?R/LHC 01/21/2022: ?  Mid LAD lesion is 40% stenosed. ?  There is severe left ventricular systolic dysfunction. ?  LV end diastolic pressure is mildly elevated. ?  The left ventricular ejection fraction is less than 25% by visual estimate. ?  ?1.  Moderate one-vessel coronary artery disease with 40% stenosis in the mid LAD.  No evidence of obstructive disease. ?2.  Severely reduced LV systolic function with an EF of 15%. ?3.  Right heart catheterization showed mildly elevated filling pressures, mild pulmonary hypertension and normal cardiac output. ?  ?Recommendations: ?The patient has nonischemic cardiomyopathy.  We will switch to oral furosemide and try to add heart failure medications.  Continue milrinone likely for another day. ?_____________  ? ?Patient Profile  ?    ?41 y.o. 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

## 2022-01-27 NOTE — Progress Notes (Addendum)
? ?                                                                                                                                                     ?                                                   ?Daily Progress Note  ? ?Patient Name: Mario Beck       Date: 01/27/2022 ?DOB: [birth year]  Age: 41 y.o. MRN#: [id number] ?Attending Physician: [name], MD ?Primary Care Physician: Pcp, No ?Admit Date: 01/17/2022 ? ?Reason for Consultation/Follow-up: Establishing goals of care ? ?Subjective: ?Notes, diagnostics, and labs reviewed. In to see patient. No family at bedside. He states his girlfriend knows the basics of his diagnoses, but he states he does not share a lot. He thanks me for coming in to speak with him when he is alone. 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 He states he needs to be home and working. Discussed QOL. He states his QOL has been acceptable prior to this admission. He states he has children and commitments he needs to continue on for. He states he understands he will need to make changes to his life including diet, medication, and lifestyle choices. He states he did not realize he was so sick, and is willing to make whatever modifications are needed and would want whatever care needed. At this point, his girlfriend was entering slowly and I looked to him, and he very quietly said "tell her wait outside". Discussed that as his surrogate decision maker, he needs to communicate to her his diagnosis and wishes for care he would or would not want. Discussed limits to care and he states he will need to think about this. Advised I would step out so she can visit. He advises to speak again, I can return early in the morning before she comes, and shares to call his cell phone to be sure.  ? ?Advised him I would lock note so it would not be visible in the patient portal, and he thanks me. ? ?Discussed case with cardiology. Patient would be a good  candidate for continued aggressive care including heart transplant if he is willing to take medications, go to appointments, and make lifestyle modifications. ? ?Length of Stay: 10 ? ?Current Medications: ?Scheduled Meds:  ? amoxicillin-clavulanate  1 tablet Oral Q12H  ? apixaban  10 mg Oral BID  ? Followed by  ? [START ON 01/30/2022] apixaban  5 mg Oral BID  ? carvedilol  3.125 mg Oral BID WC  ? dapagliflozin propanediol  10 mg Oral Daily  ? digoxin  0.125 mg Oral Daily  ? fentaNYL      ? folic acid  1 mg Oral Daily  ? furosemide  40 mg Oral Daily  ? lidocaine  1 patch Transdermal Q24H  ? losartan  12.5 mg Oral Daily  ? midazolam      ? multivitamin with minerals  1 tablet Oral Daily  ? nicotine  21 mg Transdermal Daily  ? sodium chloride flush  3 mL Intravenous Q12H  ? spironolactone  25 mg Oral Daily  ? thiamine  100 mg Oral Daily  ? ? ?Continuous Infusions: ? sodium chloride 250 mL (01/23/22 1124)  ? ? ?PRN Meds: ?acetaminophen **OR** acetaminophen, guaiFENesin, hydrOXYzine, ondansetron (ZOFRAN) IV, oxyCODONE, polyethylene glycol ? ?Physical Exam ?Pulmonary:  ?   Effort: Pulmonary effort is normal.  ?Neurological:  ?   Mental Status: He is alert.  ?         ? ?Vital Signs: BP 124/82 (BP Location: Left Arm)   Pulse 90   Temp 97.6 ?F (36.4 ?C) (Oral)   Resp 18   Ht 6\' 3"  (1.905 m)   Wt 89.3 kg   SpO2 98%   BMI 24.61 kg/m?  ?SpO2: SpO2: 98 % ?O2 Device: O2 Device: Room Air ?O2 Flow Rate: O2 Flow Rate (L/min): 2 L/min ? ?Intake/output summary:  ?Intake/Output Summary (Last 24 hours) at 01/27/2022 1344 ?Last data filed at 01/26/2022 2350 ?Gross per 24 hour  ?Intake --  ?Output 1000 ml  ?Net -1000 ml  ? ?LBM: Last BM Date : 01/26/22 ?Baseline Weight: Weight: 88.5 kg ?Most recent weight: Weight: 89.3 kg ? ? ? ? ?Patient Active Problem List  ? Diagnosis Date Noted  ? Leukocytosis 01/24/2022  ? Parapneumonic effusion 01/24/2022  ? Cardiogenic shock (HCC) 01/19/2022  ? Pleuritic chest pain 01/19/2022  ? CAP (community  acquired pneumonia) 01/19/2022  ? Pleural effusion on left 01/19/2022  ? Acute pulmonary embolism (HCC) 01/19/2022  ? Acute on chronic HFrEF (heart failure with reduced ejection fraction) (HCC) 01/17/2022  ? Syncope 01/17/2022  ? AKI (acute kidney injury) (HCC) 01/17/2022  ? Hyperkalemia 01/17/2022  ? Lactic acidosis 01/17/2022  ? Hypokalemia 06/18/2021  ? COVID-19 virus infection 06/18/2021  ? Hypomagnesemia 06/18/2021  ? Acute on chronic systolic CHF (congestive heart failure) (HCC) 06/17/2021  ? Acute on chronic combined systolic and diastolic CHF (congestive heart failure) (HCC) 06/17/2021  ? Epigastric pain   ? Alcohol withdrawal syndrome without complication (HCC)   ? 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 Alcoholic cardiomyopathy (HCC) 95/63/8756  ? Alcoholic pancreatitis 09/16/2020  ? Tobacco abuse 09/16/2020  ? Abnormal EKG 09/16/2020  ? GERD (gastroesophageal reflux disease) 09/16/2020  ? Depression 09/16/2020  ? Dark stools 09/16/2020  ? Elevated troponin 09/16/2020  ? Transaminitis 03/31/2020  ? Nausea and vomiting 02/21/2020  ? Intractable nausea and vomiting 02/21/2020  ? Tobacco abuse counseling   ? Acute alcoholic pancreatitis 02/18/2020  ? HTN (hypertension), malignant 02/18/2020  ? Alcohol use disorder, moderate, dependence (HCC) 02/18/2020  ? Benign essential HTN 12/01/2015  ? ? ?Palliative Care Assessment & Plan  ? ? ?Recommendations/Plan: ?Will continue to follow.  ?Recommend outpatient palliative to follow. ?Code Status: ? ?  ?Code Status Orders  ?(From admission, onward)  ?  ? ? ?  ? ?  Start     Ordered  ? 01/17/22 1450  Full code  Continuous       ? 01/17/22 1451  ? ?  ?  ? ?  ? ?  Code Status History   ? ? Date Active Date Inactive Code Status Order ID Comments User Context  ? 06/17/2021 2258 06/21/2021 1900 Full Code [id number]  Therisa Doyne, MD ED  ? 05/28/2021 0902 05/29/2021 1911 Full Code [id number]  Carollee Herter, DO ED  ? 05/28/2021 0802 05/28/2021 0902 Full Code [id number]  Carollee Herter, DO ED   ? 02/19/2021 2229 02/21/2021 2326 Full Code [id number]  Mansy, Vernetta Honey, MD ED  ? 09/16/2020 1437 09/18/2020 1821 Full Code [id number]  Lorretta Harp, MD ED  ? 03/31/2020 1754 04/03/2020 1656 Full Code [id number]  Eddie North, MD ED  ? 02/21/2020 0214 02/23/2020 1658 Full Code [id number]  Mansy, Vernetta Honey, MD ED  ? 02/18/2020 0115 02/19/2020 1958 Full Code [id number]  Andris Baumann, MD ED  ? 07/17/2018 0646 07/17/2018 1006 Full Code [id number]  Ward, Layla Maw, DO ED  ? ?  ? ? ?Prognosis: ? Poor overall ? ? ? ?Care plan was discussed with cardiology ? ?Thank you for allowing the Palliative Medicine Team to assist in the care of this patient. ? ? ? ?Morton Stall, NP ? ?Please contact Palliative Medicine Team phone at [phone] for questions and concerns.  ? ? ? ? ? ?

## 2022-01-27 NOTE — Progress Notes (Signed)
Patient clinically stable post 12 FR CT placement per Dr Dwaine Gale, tolerated well. Vitals stable pre and post procedure. Received VErsed 2 mg along with Fentanyl 50 mcg IV for procedure. Report given to Nurse 146 post procedure/bedside. ?

## 2022-01-27 NOTE — Procedures (Signed)
Interventional Radiology Procedure Note ? ?Procedure: CT guided right chest tube placement (12 fr) ? ?Indication: Complex right pleural effusion ? ?Findings: Please refer to procedural dictation for full description. ? ?Complications: None ? ?EBL: < 10 mL ? ?Acquanetta Belling, MD ?[phone] ? ? ?

## 2022-01-28 ENCOUNTER — Inpatient Hospital Stay: Payer: Medicaid Other

## 2022-01-28 ENCOUNTER — Other Ambulatory Visit: Payer: Self-pay

## 2022-01-28 ENCOUNTER — Inpatient Hospital Stay (HOSPITAL_COMMUNITY)
Admission: AD | Admit: 2022-01-28 | Discharge: 2022-02-01 | DRG: 186 | Disposition: A | Discharge status: Home / Self Care | Payer: Medicaid Other | Source: Other Acute Inpatient Hospital | Attending: Family Medicine | Admitting: Family Medicine

## 2022-01-28 DIAGNOSIS — F1010 Alcohol abuse, uncomplicated: Secondary | ICD-10-CM

## 2022-01-28 DIAGNOSIS — F17220 Nicotine dependence, chewing tobacco, uncomplicated: Secondary | ICD-10-CM | POA: Diagnosis present

## 2022-01-28 DIAGNOSIS — Z7901 Long term (current) use of anticoagulants: Secondary | ICD-10-CM

## 2022-01-28 DIAGNOSIS — I509 Heart failure, unspecified: Secondary | ICD-10-CM | POA: Diagnosis not present

## 2022-01-28 DIAGNOSIS — F32A Depression, unspecified: Secondary | ICD-10-CM | POA: Diagnosis present

## 2022-01-28 DIAGNOSIS — K219 Gastro-esophageal reflux disease without esophagitis: Secondary | ICD-10-CM | POA: Diagnosis present

## 2022-01-28 DIAGNOSIS — D509 Iron deficiency anemia, unspecified: Secondary | ICD-10-CM | POA: Diagnosis present

## 2022-01-28 DIAGNOSIS — J189 Pneumonia, unspecified organism: Secondary | ICD-10-CM | POA: Diagnosis present

## 2022-01-28 DIAGNOSIS — Z888 Allergy status to other drugs, medicaments and biological substances status: Secondary | ICD-10-CM | POA: Diagnosis not present

## 2022-01-28 DIAGNOSIS — I2721 Secondary pulmonary arterial hypertension: Secondary | ICD-10-CM | POA: Diagnosis present

## 2022-01-28 DIAGNOSIS — J90 Pleural effusion, not elsewhere classified: Secondary | ICD-10-CM | POA: Diagnosis present

## 2022-01-28 DIAGNOSIS — K709 Alcoholic liver disease, unspecified: Secondary | ICD-10-CM | POA: Diagnosis present

## 2022-01-28 DIAGNOSIS — I10 Essential (primary) hypertension: Secondary | ICD-10-CM

## 2022-01-28 DIAGNOSIS — Z720 Tobacco use: Secondary | ICD-10-CM | POA: Diagnosis not present

## 2022-01-28 DIAGNOSIS — I110 Hypertensive heart disease with heart failure: Secondary | ICD-10-CM | POA: Diagnosis present

## 2022-01-28 DIAGNOSIS — F1020 Alcohol dependence, uncomplicated: Secondary | ICD-10-CM | POA: Diagnosis present

## 2022-01-28 DIAGNOSIS — F17210 Nicotine dependence, cigarettes, uncomplicated: Secondary | ICD-10-CM | POA: Diagnosis present

## 2022-01-28 DIAGNOSIS — G4700 Insomnia, unspecified: Secondary | ICD-10-CM | POA: Diagnosis present

## 2022-01-28 DIAGNOSIS — I428 Other cardiomyopathies: Secondary | ICD-10-CM | POA: Diagnosis present

## 2022-01-28 DIAGNOSIS — F419 Anxiety disorder, unspecified: Secondary | ICD-10-CM | POA: Diagnosis present

## 2022-01-28 DIAGNOSIS — Z86711 Personal history of pulmonary embolism: Secondary | ICD-10-CM

## 2022-01-28 DIAGNOSIS — Z79899 Other long term (current) drug therapy: Secondary | ICD-10-CM

## 2022-01-28 DIAGNOSIS — I2510 Atherosclerotic heart disease of native coronary artery without angina pectoris: Secondary | ICD-10-CM | POA: Diagnosis present

## 2022-01-28 DIAGNOSIS — I2690 Septic pulmonary embolism without acute cor pulmonale: Secondary | ICD-10-CM

## 2022-01-28 DIAGNOSIS — I5023 Acute on chronic systolic (congestive) heart failure: Secondary | ICD-10-CM | POA: Diagnosis present

## 2022-01-28 DIAGNOSIS — G621 Alcoholic polyneuropathy: Secondary | ICD-10-CM | POA: Diagnosis present

## 2022-01-28 DIAGNOSIS — Z91013 Allergy to seafood: Secondary | ICD-10-CM

## 2022-01-28 DIAGNOSIS — J45909 Unspecified asthma, uncomplicated: Secondary | ICD-10-CM

## 2022-01-28 DIAGNOSIS — E785 Hyperlipidemia, unspecified: Secondary | ICD-10-CM

## 2022-01-28 DIAGNOSIS — Z8616 Personal history of COVID-19: Secondary | ICD-10-CM | POA: Diagnosis not present

## 2022-01-28 DIAGNOSIS — I2699 Other pulmonary embolism without acute cor pulmonale: Secondary | ICD-10-CM | POA: Diagnosis present

## 2022-01-28 LAB — COMPREHENSIVE METABOLIC PANEL
ALT: 24 U/L (ref 0–44)
AST: 23 U/L (ref 15–41)
Albumin: 2.3 g/dL — ABNORMAL LOW (ref 3.5–5.0)
Alkaline Phosphatase: 174 U/L — ABNORMAL HIGH (ref 38–126)
Anion gap: 9 (ref 5–15)
BUN: 11 mg/dL (ref 6–20)
CO2: 28 mmol/L (ref 22–32)
Calcium: 8.9 mg/dL (ref 8.9–10.3)
Chloride: 101 mmol/L (ref 98–111)
Creatinine, Ser: 0.81 mg/dL (ref 0.61–1.24)
GFR, Estimated: >60 mL/min (ref >60)
Glucose, Bld: 119 mg/dL — ABNORMAL HIGH (ref 70–99)
Potassium: 4 mmol/L (ref 3.5–5.1)
Sodium: 138 mmol/L (ref 135–145)
Total Bilirubin: 0.7 mg/dL (ref 0.3–1.2)
Total Protein: 6.9 g/dL (ref 6.5–8.1)

## 2022-01-28 MED ORDER — SENNOSIDES-DOCUSATE SODIUM 8.6-50 MG PO TABS
1.0000 | ORAL_TABLET | Freq: Two times a day (BID) | ORAL | Status: DC
  Administered 2022-01-28 (×2): 1 via ORAL
  Filled 2022-01-28 (×2): qty 1

## 2022-01-28 MED ORDER — CARVEDILOL 3.125 MG PO TABS
3.1250 mg | ORAL_TABLET | Freq: Two times a day (BID) | ORAL | 1 refills | Status: AC
  Filled 2022-01-28 – 2022-02-03 (×2): qty 60, 30d supply, fill #0

## 2022-01-28 MED ORDER — DOCUSATE SODIUM 100 MG PO CAPS
100.0000 mg | ORAL_CAPSULE | Freq: Two times a day (BID) | ORAL | Status: DC
  Administered 2022-01-28 (×2): 100 mg via ORAL
  Filled 2022-01-28 (×2): qty 1

## 2022-01-28 MED ORDER — NICOTINE 21 MG/24HR TD PT24
21.0000 mg | MEDICATED_PATCH | Freq: Every day | TRANSDERMAL | 0 refills | Status: DC
  Filled 2022-01-28: qty 28, 28d supply, fill #0

## 2022-01-28 MED ORDER — APIXABAN 5 MG PO TABS
10.0000 mg | ORAL_TABLET | Freq: Two times a day (BID) | ORAL | 1 refills | Status: DC
  Filled 2022-01-28: qty 60, 28d supply, fill #0

## 2022-01-28 MED ORDER — MAGNESIUM HYDROXIDE 400 MG/5ML PO SUSP: 30.0000 mL | Freq: Three times a day (TID) | ORAL | Status: DC | PRN

## 2022-01-28 MED ORDER — LOSARTAN POTASSIUM 25 MG PO TABS
12.5000 mg | ORAL_TABLET | Freq: Every day | ORAL | 1 refills | Status: AC
  Filled 2022-01-28 – 2022-02-03 (×2): qty 15, 30d supply, fill #0

## 2022-01-28 MED ORDER — AMOXICILLIN-POT CLAVULANATE 875-125 MG PO TABS
1.0000 | ORAL_TABLET | Freq: Two times a day (BID) | ORAL | 0 refills | Status: DC
  Filled 2022-01-28: qty 12, 6d supply, fill #0

## 2022-01-28 MED ORDER — FOLIC ACID 1 MG PO TABS
1.0000 mg | ORAL_TABLET | Freq: Every day | ORAL | 0 refills | Status: AC
  Filled 2022-01-28 – 2022-02-03 (×2): qty 30, 30d supply, fill #0

## 2022-01-28 MED ORDER — ATORVASTATIN CALCIUM 10 MG PO TABS
10.0000 mg | ORAL_TABLET | Freq: Every day | ORAL | 0 refills | Status: AC
  Filled 2022-01-28 – 2022-02-03 (×2): qty 30, 30d supply, fill #0

## 2022-01-28 MED ORDER — SENNOSIDES-DOCUSATE SODIUM 8.6-50 MG PO TABS
1.0000 | ORAL_TABLET | Freq: Two times a day (BID) | ORAL | 0 refills | Status: AC
  Filled 2022-01-28: qty 30, 15d supply, fill #0

## 2022-01-28 MED ORDER — DIGOXIN 125 MCG PO TABS
0.1250 mg | ORAL_TABLET | Freq: Every day | ORAL | 1 refills | Status: AC
  Filled 2022-01-28 – 2022-02-03 (×2): qty 30, 30d supply, fill #0

## 2022-01-28 MED ORDER — SPIRONOLACTONE 25 MG PO TABS
25.0000 mg | ORAL_TABLET | Freq: Every day | ORAL | 1 refills | Status: AC
Start: 2022-01-29 — End: ?
  Filled 2022-01-28 – 2022-02-03 (×2): qty 30, 30d supply, fill #0

## 2022-01-28 MED ORDER — OXYCODONE HCL 5 MG PO TABS: 5.0000 mg | ORAL_TABLET | Freq: Four times a day (QID) | ORAL | 0 refills | Status: AC | PRN

## 2022-01-28 MED ORDER — DAPAGLIFLOZIN PROPANEDIOL 10 MG PO TABS
10.0000 mg | ORAL_TABLET | Freq: Every day | ORAL | 0 refills | Status: AC
  Filled 2022-01-28 – 2022-02-03 (×2): qty 30, 30d supply, fill #0

## 2022-01-28 NOTE — Discharge Summary (Addendum)
?Physician Discharge Summary ?  ?Patient: Mario Beck MRN: [id number] DOB: 05/20/1981  ?Admit date:     01/17/2022  ?Discharge date: 01/28/22  ?Discharge Physician: [name]  ? ?PCP: Pcp, No  ? ?Recommendations at discharge:  ? ?follow-up with your PCP after your discharge from [facility] according to their instruction. ?Follow-up with [facility] cardiology in [place] after discharge from [place] [facility] ? ?Discharge Diagnoses: ?Complex right pleural effusion status post chest tube placed ?Community acquired pneumonia on antibiotic ?Acute on chronic systolic heart failure with EF of less than 20% moderate to severe MR ? ?Hospital Course: ?41 yo male with a history of cardiomyopathy presented to the [facility] ER on 4/3 complaining of 3-4 days of dyspnea, fatigue and weight gain. He says that he has been compliant with his medications which include furosemide, metoprolol, nexium, potassium and sertraline.  However he says that the lasix has not been working for him in the last week or so and his urine output has become very minimal and dark.  He has noted increasing dyspnea when he climbs a flight of stairs in his apartment and yesterday passed out twice when he was walking across the parking lot.   ?He was admitted to [facility] for alcohol detox on 3/21, treated with lithium, and discharged on 3/24.  He says that he has been compliant with his medications. ?  ?Found to have acute PE on the right with probable pulmonary infarct.  Started on heparin drip, then transitioned to Eliquis. ?  ?4/10: despite several days Cefepime, WBC increasing.  Repeat CXR with worsened R-sided infiltrates and parapneumonia effusion.   ?4/11: d/w Dr. Meredeth Ide and plans for CT guided right sided chest tube placement for possible early empyema ?  ?4/13: s/p right sided CT guided pleural drain placement for complex effusion ? ?Complex right-sided pleural effusion with history of CAP ?-- underwent ultrasound-guided thoracentesis with 200 mL of  blood-tinged fluid, exudate, culture so far negative ?-- Dr. Meredeth Ide recommends CT guided chest tube placement. IR to do tomorrow ?-- continue IV unasyn--changed to po augmentin ?--s/p CT guided right pleural drain placement ?--cxr 4/14--no improvement. Dr. Meredeth Ide pulmonary recommends patient be transferred to [facility] for evaluation with cardiothoracic surgery. ? ?Acute on chronic HFrEF (heart failure with reduced ejection fraction) (HCC) ?hypertension ?--Admitted to ICU with cardiogenic shock 4/3.  Presented with worsening dyspnea and orthopnea, weight gain, elevated --BNP 2253, and bilateral pleural effusions. ?--Most recent echo with EF less than 20% with moderate to severe MR. ?--Right and left cardiac cath on 4/7 without significant CAD, findings consistent with nonischemic cardiomyopathy (see report, or conclusion below). ?-- Management per cardiology--[facility] ?--continue Coreg,digoxin, Lasix 40 mg daily, losartan, spironolactone ?  ?Acute PE on the right ?--was on IV heparin gtt--now on po eliquis. (Resumed after CT placement) ? ?cardiogenic shock POA ?-- resolved ?  ?Left-sided pleural effusion underwent thoracentesis on 4/4 with 850 mL removed. ?-- Fluid culture negative cytology negative for malignancy ?   ?Alcohol use disorder, moderate, dependence (HCC) ?-Last drink 8 days prior to admission, recently detoxed in [place] and had been at a rehab facility as well.   ?-Denies any alcohol consumption since.  No signs of withdrawal.   ?Stopped CIWA.  Stable ?  ?Tobacco abuse counseling ?Counseled regarding importance of cessation.  Nicotine patch ordered. ?-- Patient continues to go out smoking despite discussing with him. ?-- Discussed with patient per hospital policy he is not allowed and if he does not follow policy will have  to consider discharge. Teacher, adult education present) d/w [facility] ?  ?  ?  ?Family communication : patient states he will inform his girlfriend. ?Consults : pulmonary, cardiology ?CODE STATUS:  full ?DVT Prophylaxis : eliquis ?Level of care: Med-Surg ? ?will transfer to [facility] for further evaluation on his right complex pleural effusion. Patient is in agreement with plan. ? ?  ? ? ?Disposition:  [place] ?Diet recommendation:  ?Discharge Diet Orders (From admission, onward)  ? ?  Start     Ordered  ? 01/28/22 0000  Diet - low sodium heart healthy       ? 01/28/22 1354  ? ?  ?  ? ?  ? ?Cardiac diet ?DISCHARGE MEDICATION: ?Allergies as of 01/28/2022   ? ?   Reactions  ? Lisinopril Swelling  ? Angioedema  ? Shellfish Allergy Swelling  ? ?  ? ?  ?Medication List  ?  ? ?STOP taking these medications   ? ?albuterol 108 (90 Base) MCG/ACT inhaler ?Commonly known as: VENTOLIN HFA ?  ?hydrALAZINE 25 MG tablet ?Commonly known as: APRESOLINE ?  ?metoprolol succinate 100 MG 24 hr tablet ?Commonly known as: TOPROL-XL ?  ?potassium chloride SA 20 MEQ tablet ?Commonly known as: KLOR-CON M ?  ? ?  ? ?TAKE these medications   ? ?amoxicillin-clavulanate 875-125 MG tablet ?Commonly known as: AUGMENTIN ?Take 1 tablet by mouth every 12 (twelve) hours for 6 days. ?  ?apixaban 5 MG Tabs tablet ?Commonly known as: ELIQUIS ?Take 2 tablets (10 mg total) by mouth 2 (two) times daily. From 01/30/22-- take 5 mg bid ?  ?atorvastatin 10 MG tablet ?Commonly known as: LIPITOR ?Take 1 tablet (10 mg total) by mouth daily. ?Start taking on: January 29, 2022 ?  ?carvedilol 3.125 MG tablet ?Commonly known as: COREG ?Take 1 tablet (3.125 mg total) by mouth 2 (two) times daily with a meal. ?  ?dapagliflozin propanediol 10 MG Tabs tablet ?Commonly known as: FARXIGA ?Take 1 tablet (10 mg total) by mouth daily. ?Start taking on: January 29, 2022 ?  ?digoxin 0.125 MG tablet ?Commonly known as: LANOXIN ?Take 1 tablet (0.125 mg total) by mouth daily. ?Start taking on: January 29, 2022 ?  ?folic acid 1 MG tablet ?Commonly known as: FOLVITE ?Take 1 tablet (1 mg total) by mouth daily. ?Start taking on: January 29, 2022 ?  ?furosemide 40 MG tablet ?Commonly  known as: Lasix ?Take 1 tablet (40 mg total) by mouth once daily. ?  ?losartan 25 MG tablet ?Commonly known as: COZAAR ?Take 0.5 tablets (12.5 mg total) by mouth daily. ?Start taking on: January 29, 2022 ?  ?multivitamin with minerals Tabs tablet ?Take 1 tablet by mouth daily. ?  ?nicotine 21 mg/24hr patch ?Commonly known as: NICODERM CQ - dosed in mg/24 hours ?Place 1 patch (21 mg total) onto the skin daily. ?Start taking on: January 29, 2022 ?  ?oxyCODONE 5 MG immediate release tablet ?Commonly known as: Oxy IR/ROXICODONE ?Take 1 tablet (5 mg total) by mouth every 6 (six) hours as needed for severe pain or moderate pain. ?  ?pantoprazole 40 MG tablet ?Commonly known as: Protonix ?Take 1 tablet (40 mg total) by mouth daily. ?  ?senna-docusate 8.6-50 MG tablet ?Commonly known as: Senokot-S ?Take 1 tablet by mouth 2 (two) times daily. ?  ?spironolactone 25 MG tablet ?Commonly known as: ALDACTONE ?Take 1 tablet (25 mg total) by mouth daily. ?Start taking on: January 29, 2022 ?  ? ?  ? ? Follow-up Information   ? ?  End, Cristal Deer, MD Follow up in 1 week(s).   ?Specialty: Cardiology ?Contact information: ?[address] ?[address] ?[place] [address] ?[phone] ? ? ?  ?  ? ?  ?  ? ?  ? ?Discharge Exam: ?Filed Weights  ? 01/26/22 0500 01/27/22 0539 01/28/22 0450  ?Weight: 89.5 kg 89.3 kg 90.7 kg  ? ? ? ?Condition at discharge: fair ? ?The results of significant diagnostics from this hospitalization (including imaging, microbiology, ancillary and laboratory) are listed below for reference.  ? ?Imaging Studies: ?DG Chest 2 View ? ?Result Date: 01/28/2022 ?CLINICAL DATA:  Pleural effusion follow-up EXAM: CHEST - 2 VIEW COMPARISON:  01/25/2022 FINDINGS: Similar moderate right pleural effusion and associated atelectasis. Increased trace left pleural effusion. Similar additional patchy right lung and left mid lung opacities. Stable cardiomegaly. IMPRESSION: Similar moderate right pleural effusion. Increased trace left  pleural effusion. Similar right greater than left lung opacities. Electronically Signed   By: Guadlupe Spanish M.D.   On: 01/28/2022 08:15  ? ?DG Chest 2 View ? ?Result Date: 01/17/2022 ?CLINICAL DATA:  41 year old m

## 2022-01-28 NOTE — H&P (Signed)
?History and Physical  ? ? ?Mario Beck [id number] DOB: 1981-03-25 DOA: 01/28/2022 ? ?PCP: Pcp, No ? ?Patient coming from:  Mario Beck ? ?HPI: Mario Beck is a 41 y.o. male with medical history significant of chronic systolic CHF, hypertension, tobacco abuse, depression, asthma.  History of alcohol abuse and recently underwent detox in [place].  Presented to [facility] on 4/3 with complaints of dyspnea, fatigue, and weight gain.  Admitted to the ICU with cardiogenic shock.  Most recent echo with EF less than 20% with moderate to severe MR.  Right and left heart cath on 4/7 without significant CAD, findings consistent with nonischemic cardiomyopathy.  Also treated for multifocal pneumonia and acute right lung segmental and subsegmental PE.  Underwent thoracentesis on 4/4 for left-sided pleural effusion with 850 mL removed, culture negative, cytology negative for malignancy.  4/10 chest x-ray with worsening right-sided infiltrates and parapneumonic effusion.  4/11 underwent ultrasound-guided thoracentesis of right-sided effusion with only 200 mL of fluid removed given collection was severely loculated concerning for empyema or complicated effusion, culture negative so far.  Status post right-sided CT guided pleural drain placement for complex effusion on 4/13.  Initially treated with Unasyn then switched to Augmentin.  Chest x-ray 4/14 showing no improvement and transferred to [facility] for cardiothoracic surgery evaluation. ? ?Patient is complaining of severe right-sided chest pain at the site of pleural drain insertion.  He was receiving Dilaudid and oxycodone at [facility] which helped and is requesting the same dose of his pain medications.  Denies shortness of breath.  Reports cough early in the morning when he gets up.  Denies fevers or chills.  No other complaints. ? ?Review of Systems:  ?Review of Systems  ?All other systems reviewed and are negative. ? ?Past Medical History:  ?Diagnosis Date  ? Alcohol abuse    ? Asthma   ? CHF (congestive heart failure) ([place])   ? Depression   ? Hypertension   ? ? ?Past Surgical History:  ?Procedure Laterality Date  ? FRACTURE SURGERY    ? Left leg as a kid  ? RIGHT/LEFT HEART CATH AND CORONARY ANGIOGRAPHY N/A 01/21/2022  ? Procedure: RIGHT/LEFT HEART CATH AND CORONARY ANGIOGRAPHY;  Surgeon: Wellington Hampshire, MD;  Location: [facility];  Service: Cardiovascular;  Laterality: N/A;  ? ? ? reports that he has been smoking cigarettes. He has been smoking an average of 1 pack per day. His smokeless tobacco use includes snuff. He reports that he does not currently use alcohol after a past usage of about 7.0 standard drinks per week. He reports that he does not use drugs. ? ?Allergies  ?Allergen Reactions  ? Lisinopril Swelling  ?  Angioedema ?  ? Shellfish Allergy Swelling  ? ? ?Family History  ?Problem Relation Age of Onset  ? Colon cancer Neg Hx   ? Esophageal cancer Neg Hx   ? ? ?Prior to Admission medications   ?Medication Sig Start Date End Date Taking? Authorizing Provider  ?amoxicillin-clavulanate (AUGMENTIN) 875-125 MG tablet Take 1 tablet by mouth every 12 (twelve) hours for 6 days. 01/28/22 02/03/22  Mario Mandes, MD  ?apixaban (ELIQUIS) 5 MG TABS tablet Take 2 tablets (10 mg total) by mouth 2 (two) times daily. Starting on 01/30/22-- take 1 tablet (5 mg) twice daily. 01/28/22   Mario Mandes, MD  ?atorvastatin (LIPITOR) 10 MG tablet Take 1 tablet (10 mg total) by mouth once daily. 01/29/22   Mario Mandes, MD  ?carvedilol (COREG) 3.125 MG tablet  Take 1 tablet (3.125 mg total) by mouth 2 (two) times daily with a meal. 01/28/22   Mario Mandes, MD  ?dapagliflozin propanediol (FARXIGA) 10 MG TABS tablet Take 1 tablet (10 mg total) by mouth once daily. 01/29/22   Mario Mandes, MD  ?digoxin (LANOXIN) 0.125 MG tablet Take 1 tablet (0.125 mg total) by mouth once daily. 01/29/22   Mario Mandes, MD  ?folic acid (FOLVITE) 1 MG tablet Take 1 tablet (1 mg total) by mouth once daily. 01/29/22   Mario Mandes, MD  ?furosemide (LASIX) 40 MG tablet Take 1 tablet (40 mg total) by mouth once daily. 12/08/21 03/08/22  Mario Starch, MD  ?losartan (COZAAR) 25 MG tablet Take 1/2 tablet (12.5 mg total) by mouth once daily. 01/29/22   Mario Mandes, MD  ?Multiple Vitamin (MULTIVITAMIN WITH MINERALS) TABS tablet Take 1 tablet by mouth daily. 05/30/21   Lorella Nimrod, MD  ?nicotine (NICODERM CQ - DOSED IN MG/24 HOURS) 21 mg/24hr patch Place 1 patch (21 mg total) onto the skin daily. 01/29/22   Mario Mandes, MD  ?oxyCODONE (OXY IR/ROXICODONE) 5 MG immediate release tablet Take 1 tablet (5 mg total) by mouth every 6 (six) hours as needed for severe pain or moderate pain. 01/28/22   Mario Mandes, MD  ?pantoprazole (PROTONIX) 40 MG tablet Take 1 tablet (40 mg total) by mouth daily. 12/08/21 01/07/22  Mario Starch, MD  ?senna-docusate (SENOKOT-S) 8.6-50 MG tablet Take 1 tablet by mouth 2 (two) times daily. 01/28/22   Mario Mandes, MD  ?spironolactone (ALDACTONE) 25 MG tablet Take 1 tablet (25 mg total) by mouth once daily. 01/29/22   Mario Mandes, MD  ? ? ?Physical Exam: ?Vitals:  ? 01/28/22 2309 01/29/22 0000 01/29/22 0041  ?BP: 126/83 125/84   ?Pulse: 99 95   ?Resp: 17    ?Temp: 99.1 ?F (37.3 ?C)    ?TempSrc: Oral  Oral  ?SpO2: 96% 96%   ? ? ?Physical Exam ?Vitals reviewed.  ?Constitutional:   ?   General: He is not in acute distress. ?HENT:  ?   Head: Normocephalic and atraumatic.  ?Eyes:  ?   Extraocular Movements: Extraocular movements intact.  ?   Conjunctiva/sclera: Conjunctivae normal.  ?Cardiovascular:  ?   Rate and Rhythm: Normal rate and regular rhythm.  ?   Pulses: Normal pulses.  ?Pulmonary:  ?   Effort: Pulmonary effort is normal. No respiratory distress.  ?   Breath sounds: No wheezing or rales.  ?Abdominal:  ?   General: Bowel sounds are normal. There is no distension.  ?   Palpations: Abdomen is soft.  ?   Tenderness: There is no abdominal tenderness.  ?Musculoskeletal:     ?   General: No swelling or tenderness.  ?    Cervical back: Normal range of motion.  ?Skin: ?   General: Skin is warm and dry.  ?Neurological:  ?   General: No focal deficit present.  ?   Mental Status: He is alert and oriented to person, place, and time.  ?  ? ?Labs on Admission: I have personally reviewed following labs and imaging studies ? ?CBC: ?Recent Labs  ?Lab 01/22/22 ?[id number] 01/23/22 ?0704 01/24/22 ?0404 01/25/22 ?0411 01/26/22 ?[id number]  ?WBC 12.5* 13.9* 15.5* 14.7* 16.8*  ?HGB 8.6* 8.5* 8.7* 8.3* 10.4*  ?HCT 25.7* 25.7* 26.1* 25.0* 32.0*  ?MCV 81.6 81.3 81.6 81.2 81.4  ?PLT 279 322 373 465* 628*  ? ?Basic Metabolic Panel: ?Recent Labs  ?Lab 01/22/22 ?[id number] 01/23/22 ?[id number]  01/24/22 ?0404 01/25/22 ?0411 01/26/22 ?[id number] 01/28/22 ?0541  ?NA 135 131* 137 137 137 138  ?K 4.0 3.9 4.3 4.3 4.1 4.0  ?CL 97* 95* 99 100 99 101  ?CO2 30 29 28 28 26 28   ?GLUCOSE 135* 183* 141* 125* 122* 119*  ?BUN 11 12 16 15 14 11   ?CREATININE 0.85 0.88 1.03 1.13 1.05 0.81  ?CALCIUM 8.7* 8.7* 9.1 8.8* 9.2 8.9  ?MG 2.0 1.8 2.2  --  2.4  --   ? ?GFR: ?Estimated Creatinine Clearance: 146.3 mL/min (by C-G formula based on SCr of 0.81 mg/dL). ?Liver Function Tests: ?Recent Labs  ?Lab 01/28/22 ?0541  ?AST 23  ?ALT 24  ?ALKPHOS 174*  ?BILITOT 0.7  ?PROT 6.9  ?ALBUMIN 2.3*  ? ?No results for input(s): LIPASE, AMYLASE in the last 168 hours. ?No results for input(s): AMMONIA in the last 168 hours. ?Coagulation Profile: ?No results for input(s): INR, PROTIME in the last 168 hours. ?Cardiac Enzymes: ?No results for input(s): CKTOTAL, CKMB, CKMBINDEX, TROPONINI in the last 168 hours. ?BNP (last 3 results) ?No results for input(s): PROBNP in the last 8760 hours. ?HbA1C: ?No results for input(s): HGBA1C in the last 72 hours. ?CBG: ?No results for input(s): GLUCAP in the last 168 hours. ?Lipid Profile: ?No results for input(s): CHOL, HDL, LDLCALC, TRIG, CHOLHDL, LDLDIRECT in the last 72 hours. ?Thyroid Function Tests: ?No results for input(s): TSH, T4TOTAL, FREET4, T3FREE, THYROIDAB in the last 72  hours. ?Anemia Panel: ?No results for input(s): VITAMINB12, FOLATE, FERRITIN, TIBC, IRON, RETICCTPCT in the last 72 hours. ?Urine analysis: ?   ?Component Value Date/Time  ? COLORURINE YELLOW (A) 01/17/2022 2302

## 2022-01-28 NOTE — Plan of Care (Signed)

## 2022-01-28 NOTE — Final Progress Note (Signed)
? ? ? ?Pulmonary Medicine  ? ? ? ? ? ? ? ? ?Date: 01/28/2022,   ?MRN# [id number] Mario Beck 1981/09/06 ? ? ?  ?AdmissionWeight: 88.5 kg                 ?CurrentWeight: 90.7 kg ? ? ? ? ? ? ? ?HISTORY OF PRESENT ILLNESS  ? ?Presently comfortable,  copd stable, pneumonia on treatment and chf being addressed. Has a loculated moderate size right effusion. Ir placed tube in place. Considering trial of tpa lysis. T - surg being consulted and transfer to [place]. Patient is in agreement. See last notes. ? ? ?PAST MEDICAL HISTORY  ? ?Past Medical History:  ?Diagnosis Date  ? Alcohol abuse   ? Asthma   ? CHF (congestive heart failure) (HCC)   ? Depression   ? Hypertension   ? ? ? ?SURGICAL HISTORY  ? ?Past Surgical History:  ?Procedure Laterality Date  ? FRACTURE SURGERY    ? Left leg as a kid  ? RIGHT/LEFT HEART CATH AND CORONARY ANGIOGRAPHY N/A 01/21/2022  ? Procedure: RIGHT/LEFT HEART CATH AND CORONARY ANGIOGRAPHY;  Surgeon: Iran Ouch, MD;  Location: [facility] INVASIVE CV LAB;  Service: Cardiovascular;  Laterality: N/A;  ? ? ? ?FAMILY HISTORY  ? ?Family History  ?Problem Relation Age of Onset  ? Colon cancer Neg Hx   ? Esophageal cancer Neg Hx   ? ? ? ?SOCIAL HISTORY  ? ?Social History  ? ?Tobacco Use  ? Smoking status: Every Day  ?  Packs/day: 1.00  ?  Types: Cigarettes  ? Smokeless tobacco: Current  ?  Types: Snuff  ? Tobacco comments:  ?  Does pouches as welll   ?Vaping Use  ? Vaping Use: Never used  ?Substance Use Topics  ? Alcohol use: Not Currently  ?  Alcohol/week: 7.0 standard drinks  ?  Types: 7 Cans of beer per week  ? Drug use: No  ? ? ? ?MEDICATIONS  ? ? ?Home Medication:  ?  ?Current Medication: ? ?Current Facility-Administered Medications:  ?  0.9 %  sodium chloride infusion, 250 mL, Intravenous, Continuous, [name], [name], MD, Last Rate: 10 mL/hr at 01/23/22 1124, 250 mL at 01/23/22 1124 ?  acetaminophen (TYLENOL) tablet 650 mg, 650 mg, Oral, Q6H PRN, 650 mg at 01/27/22 1318 **OR** acetaminophen  (TYLENOL) suppository 650 mg, 650 mg, Rectal, Q6H PRN, [name], MD ?  amoxicillin-clavulanate (AUGMENTIN) 875-125 MG per tablet 1 tablet, 1 tablet, Oral, Q12H, [name], MD, 1 tablet at 01/28/22 1034 ?  apixaban (ELIQUIS) tablet 10 mg, 10 mg, Oral, BID, 10 mg at 01/28/22 1029 **FOLLOWED BY** [START ON 01/30/2022] apixaban (ELIQUIS) tablet 5 mg, 5 mg, Oral, BID, [name], MD ?  atorvastatin (LIPITOR) tablet 10 mg, 10 mg, Oral, Daily, Creig Hines, NP, 10 mg at 01/28/22 1030 ?  carvedilol (COREG) tablet 3.125 mg, 3.125 mg, Oral, BID WC, [name], [name], MD, 3.125 mg at 01/28/22 0846 ?  dapagliflozin propanediol (FARXIGA) tablet 10 mg, 10 mg, Oral, Daily, [name], [name], PA-C, 10 mg at 01/28/22 1032 ?  digoxin (LANOXIN) tablet 0.125 mg, 0.125 mg, Oral, Daily, [name], [name], MD, 0.125 mg at 01/28/22 1032 ?  docusate sodium (COLACE) capsule 100 mg, 100 mg, Oral, BID, [name], MD, 100 mg at 01/28/22 1213 ?  folic acid (FOLVITE) tablet 1 mg, 1 mg, Oral, Daily, [name], [name], MD, 1 mg at 01/28/22 1030 ?  furosemide (LASIX) tablet 40 mg, 40  mg, Oral, Daily, [name], [name], MD, 40 mg at 01/28/22 1031 ?  guaiFENesin (ROBITUSSIN) 100 MG/5ML liquid 5 mL, 5 mL, Oral, Q4H PRN, [name], DO, 5 mL at 01/26/22 0938 ?  HYDROmorphone (DILAUDID) injection 2 mg, 2 mg, Intravenous, Q6H PRN, [name], MD, 2 mg at 01/28/22 1039 ?  hydrOXYzine (ATARAX) tablet 25 mg, 25 mg, Oral, TID PRN, [name], MD, 25 mg at 01/27/22 1318 ?  lidocaine (LIDODERM) 5 % 1 patch, 1 patch, Transdermal, Q24H, [name], [name], MD, 1 patch at 01/28/22 1110 ?  losartan (COZAAR) tablet 12.5 mg, 12.5 mg, Oral, Daily, [name], [name], MD, 12.5 mg at 01/28/22 1027 ?  magnesium hydroxide (MILK OF MAGNESIA) suspension 30 mL, 30 mL, Oral, TID PRN, [name], MD ?  multivitamin with minerals tablet 1 tablet, 1 tablet, Oral, Daily, [name], MD, 1 tablet at 01/28/22 1031 ?  nicotine (NICODERM CQ - dosed  in mg/24 hours) patch 21 mg, 21 mg, Transdermal, Daily, [name], MD, 21 mg at 01/28/22 1036 ?  ondansetron (ZOFRAN) injection 4 mg, 4 mg, Intravenous, Q6H PRN, Iran Ouch, MD, 4 mg at 01/17/22 1705 ?  oxyCODONE (Oxy IR/ROXICODONE) immediate release tablet 5 mg, 5 mg, Oral, Q6H PRN, [name], MD, 5 mg at 01/28/22 0845 ?  polyethylene glycol (MIRALAX / GLYCOLAX) packet 17 g, 17 g, Oral, Daily PRN, Iran Ouch, MD ?  senna-docusate (Senokot-S) tablet 1 tablet, 1 tablet, Oral, BID, [name], MD, 1 tablet at 01/28/22 1213 ?  sodium chloride flush (NS) 0.9 % injection 3 mL, 3 mL, Intravenous, Q12H, [name], [name], MD, 3 mL at 01/28/22 5176 ?  spironolactone (ALDACTONE) tablet 25 mg, 25 mg, Oral, Daily, [name], [name], MD, 25 mg at 01/28/22 1032 ?  thiamine tablet 100 mg, 100 mg, Oral, Daily, [name], [name], MD, 100 mg at 01/28/22 1031 ? ? ? ?ALLERGIES  ? ?Lisinopril and Shellfish allergy ? ? ? ? ?REVIEW OF SYSTEMS  ? ? ?Review of Systems: ? ?Gen:  Denies  fever, sweats, chills weigh loss  ?HEENT: Denies blurred vision, double vision, ear pain, eye pain, hearing loss, nose bleeds, sore throat ?Cardiac:  No dizziness, chest pain or heaviness, chest tightness,edema ?Resp:   Denies cough or sputum porduction, shortness of breath,wheezing, hemoptysis,  ?Gi: Denies swallowing difficulty, stomach pain, nausea or vomiting, diarrhea, constipation, bowel incontinence ?Gu:  Denies bladder incontinence, burning urine ?Ext:   Denies Joint pain, stiffness or swelling ?Skin: Denies  skin rash, easy bruising or bleeding or hives ?Endoc:  Denies polyuria, polydipsia , polyphagia or weight change ?Psych:   Denies depression, insomnia or hallucinations  ? ?Other:  All other systems negative ? ? ?VS: BP 123/74 (BP Location: Left Arm)   Pulse 100   Temp 99.1 ?F (37.3 ?C) (Oral)   Resp 20   Ht 6\' 3"  (1.905 m)   Wt 90.7 kg   SpO2 91%   BMI 24.99 kg/m?   ? ? ? ?PHYSICAL EXAM  ? ? ?GENERAL:NAD, no fevers,  chills, no weakness no fatigue ?HEAD: Normocephalic, atraumatic.  ?EYES: Pupils equal, round, reactive to light. Extraocular muscles intact. No scleral icterus.  ?MOUTH: Moist mucosal membrane. Dentition intact. No abscess noted.  ?EAR, NOSE, THROAT: Clear without exudates. No external lesions.  ?NECK: Supple. No thyromegaly. No nodules. No JVD.  ?PULMONARY: Diffuse coarse rhonchi right sided +wheezes ?CARDIOVASCULAR: S1 and S2. Regular rate and rhythm. No murmurs, rubs, or gallops. No edema.  Pedal pulses 2+ bilaterally.  ?GASTROINTESTINAL: Soft, nontender, nondistended. No masses. Positive bowel sounds. No hepatosplenomegaly.  ?MUSCULOSKELETAL: No swelling, clubbing, or edema. Range of motion full in all extremities.  ?NEUROLOGIC: Cranial nerves II through XII are intact. No gross focal neurological deficits. Sensation intact. Reflexes intact.  ?SKIN: No ulceration, lesions, rashes, or cyanosis. Skin warm and dry. Turgor intact.  ?PSYCHIATRIC: Mood, affect within normal limits. The patient is awake, alert and oriented x 3. Insight, judgment intact.  ? ? ?  ? ?IMAGING  ? ? ?DG Chest 2 View ? ?Result Date: 01/28/2022 ?CLINICAL DATA:  Pleural effusion follow-up EXAM: CHEST - 2 VIEW COMPARISON:  01/25/2022 FINDINGS: Similar moderate right pleural effusion and associated atelectasis. Increased trace left pleural effusion. Similar additional patchy right lung and left mid lung opacities. Stable cardiomegaly. IMPRESSION: Similar moderate right pleural effusion. Increased trace left pleural effusion. Similar right greater than left lung opacities. Electronically Signed   By: Guadlupe Spanish M.D.   On: 01/28/2022 08:15  ? ?DG Chest 2 View ? ?Result Date: 01/17/2022 ?CLINICAL DATA:  41 year old male with history of shortness of breath and chest pain. EXAM: CHEST - 2 VIEW COMPARISON:  Chest x-ray 12/08/2021. FINDINGS: New moderate left pleural effusion. Atelectasis and/or consolidation in the base of the left lung. Right lung is  clear. No right pleural effusion. No pneumothorax. No evidence of pulmonary edema. Heart size is normal. Upper mediastinal contours are within normal limits. IMPRESSION: 1. New moderate left pleural effusion with at

## 2022-01-29 ENCOUNTER — Inpatient Hospital Stay (HOSPITAL_COMMUNITY): Payer: Medicaid Other

## 2022-01-29 DIAGNOSIS — J45909 Unspecified asthma, uncomplicated: Secondary | ICD-10-CM

## 2022-01-29 DIAGNOSIS — J90 Pleural effusion, not elsewhere classified: Principal | ICD-10-CM

## 2022-01-29 DIAGNOSIS — Z720 Tobacco use: Secondary | ICD-10-CM

## 2022-01-29 DIAGNOSIS — F1010 Alcohol abuse, uncomplicated: Secondary | ICD-10-CM

## 2022-01-29 DIAGNOSIS — E785 Hyperlipidemia, unspecified: Secondary | ICD-10-CM

## 2022-01-29 DIAGNOSIS — I509 Heart failure, unspecified: Secondary | ICD-10-CM

## 2022-01-29 LAB — CULTURE, RESPIRATORY W GRAM STAIN: Culture: NORMAL

## 2022-01-29 LAB — BASIC METABOLIC PANEL
Anion gap: 8 (ref 5–15)
BUN: 8 mg/dL (ref 6–20)
CO2: 27 mmol/L (ref 22–32)
Calcium: 9.2 mg/dL (ref 8.9–10.3)
Chloride: 102 mmol/L (ref 98–111)
Creatinine, Ser: 0.78 mg/dL (ref 0.61–1.24)
GFR, Estimated: >60 mL/min (ref >60)
Glucose, Bld: 113 mg/dL — ABNORMAL HIGH (ref 70–99)
Potassium: 4.3 mmol/L (ref 3.5–5.1)
Sodium: 137 mmol/L (ref 135–145)

## 2022-01-29 LAB — CBC
HCT: 30.3 % — ABNORMAL LOW (ref 39.0–52.0)
Hemoglobin: 10.2 g/dL — ABNORMAL LOW (ref 13.0–17.0)
MCH: 27.1 pg (ref 26.0–34.0)
MCHC: 33.7 g/dL (ref 30.0–36.0)
MCV: 80.4 fL (ref 80.0–100.0)
Platelets: 784 10*3/uL — ABNORMAL HIGH (ref 150–400)
RBC: 3.77 MIL/uL — ABNORMAL LOW (ref 4.22–5.81)
RDW: 18.6 % — ABNORMAL HIGH (ref 11.5–15.5)
WBC: 11.8 10*3/uL — ABNORMAL HIGH (ref 4.0–10.5)
nRBC: 0.4 % — ABNORMAL HIGH (ref 0.0–0.2)

## 2022-01-29 LAB — GLUCOSE, CAPILLARY: Glucose-Capillary: 156 mg/dL — ABNORMAL HIGH (ref 70–99)

## 2022-01-29 LAB — BODY FLUID CULTURE W GRAM STAIN
Culture: NO GROWTH
Gram Stain: NONE SEEN

## 2022-01-29 MED ORDER — NALOXONE HCL 0.4 MG/ML IJ SOLN: 0.4000 mg | INTRAMUSCULAR | Status: DC | PRN

## 2022-01-29 MED ORDER — OXYCODONE HCL 5 MG PO TABS
5.0000 mg | ORAL_TABLET | Freq: Four times a day (QID) | ORAL | Status: DC | PRN
  Administered 2022-01-29 (×2): 5 mg via ORAL
  Filled 2022-01-29 (×3): qty 1

## 2022-01-29 MED ORDER — ACETAMINOPHEN 650 MG RE SUPP: 650.0000 mg | Freq: Four times a day (QID) | RECTAL | Status: DC | PRN

## 2022-01-29 MED ORDER — ATORVASTATIN CALCIUM 10 MG PO TABS
10.0000 mg | ORAL_TABLET | Freq: Every day | ORAL | Status: DC
  Administered 2022-01-29 – 2022-02-01 (×4): 10 mg via ORAL
  Filled 2022-01-29 (×4): qty 1

## 2022-01-29 MED ORDER — KETOROLAC TROMETHAMINE 30 MG/ML IJ SOLN: 30.0000 mg | Freq: Four times a day (QID) | INTRAMUSCULAR | Status: DC

## 2022-01-29 MED ORDER — APIXABAN 5 MG PO TABS
10.0000 mg | ORAL_TABLET | Freq: Two times a day (BID) | ORAL | Status: AC
  Administered 2022-01-29 (×2): 10 mg via ORAL
  Filled 2022-01-29 (×2): qty 2

## 2022-01-29 MED ORDER — AMOXICILLIN-POT CLAVULANATE 875-125 MG PO TABS
1.0000 | ORAL_TABLET | Freq: Two times a day (BID) | ORAL | Status: DC
  Administered 2022-01-29 – 2022-02-01 (×7): 1 via ORAL
  Filled 2022-01-29 (×7): qty 1

## 2022-01-29 MED ORDER — OXYCODONE HCL 5 MG PO TABS
5.0000 mg | ORAL_TABLET | ORAL | Status: DC | PRN
  Administered 2022-01-29: 5 mg via ORAL
  Administered 2022-01-30 (×2): 10 mg via ORAL
  Filled 2022-01-29: qty 1
  Filled 2022-01-29 (×2): qty 2

## 2022-01-29 MED ORDER — ACETAMINOPHEN 325 MG PO TABS
650.0000 mg | ORAL_TABLET | Freq: Four times a day (QID) | ORAL | Status: DC | PRN
  Administered 2022-01-29 – 2022-01-31 (×3): 650 mg via ORAL
  Filled 2022-01-29 (×3): qty 2

## 2022-01-29 MED ORDER — APIXABAN 5 MG PO TABS
5.0000 mg | ORAL_TABLET | Freq: Two times a day (BID) | ORAL | Status: DC
Start: 2022-01-30 — End: 2022-02-01
  Administered 2022-01-30 – 2022-02-01 (×5): 5 mg via ORAL
  Filled 2022-01-29 (×5): qty 1

## 2022-01-29 MED ORDER — DIGOXIN 125 MCG PO TABS
0.1250 mg | ORAL_TABLET | Freq: Every day | ORAL | Status: DC
  Administered 2022-01-29 – 2022-02-01 (×4): 0.125 mg via ORAL
  Filled 2022-01-29 (×4): qty 1

## 2022-01-29 MED ORDER — CARVEDILOL 3.125 MG PO TABS
3.1250 mg | ORAL_TABLET | Freq: Two times a day (BID) | ORAL | Status: DC
  Administered 2022-01-29 – 2022-02-01 (×7): 3.125 mg via ORAL
  Filled 2022-01-29 (×7): qty 1

## 2022-01-29 MED ORDER — LOSARTAN POTASSIUM 25 MG PO TABS
12.5000 mg | ORAL_TABLET | Freq: Every day | ORAL | Status: DC
  Administered 2022-01-29 – 2022-01-31 (×3): 12.5 mg via ORAL
  Filled 2022-01-29 (×3): qty 1

## 2022-01-29 MED ORDER — ALBUTEROL SULFATE (2.5 MG/3ML) 0.083% IN NEBU: 2.5000 mg | INHALATION_SOLUTION | Freq: Four times a day (QID) | RESPIRATORY_TRACT | Status: DC | PRN

## 2022-01-29 MED ORDER — SPIRONOLACTONE 25 MG PO TABS
25.0000 mg | ORAL_TABLET | Freq: Every day | ORAL | Status: DC
  Administered 2022-01-29 – 2022-01-31 (×3): 25 mg via ORAL
  Filled 2022-01-29 (×3): qty 1

## 2022-01-29 MED ORDER — DAPAGLIFLOZIN PROPANEDIOL 10 MG PO TABS
10.0000 mg | ORAL_TABLET | Freq: Every day | ORAL | Status: DC
  Administered 2022-01-29 – 2022-02-01 (×4): 10 mg via ORAL
  Filled 2022-01-29 (×4): qty 1

## 2022-01-29 MED ORDER — SODIUM CHLORIDE 0.9% FLUSH
10.0000 mL | Freq: Three times a day (TID) | INTRAVENOUS | Status: DC
  Administered 2022-01-29 – 2022-01-31 (×3): 10 mL

## 2022-01-29 MED ORDER — FUROSEMIDE 40 MG PO TABS
40.0000 mg | ORAL_TABLET | Freq: Every day | ORAL | Status: DC
Start: 2022-01-29 — End: 2022-02-01
  Administered 2022-01-29 – 2022-01-31 (×3): 40 mg via ORAL
  Filled 2022-01-29 (×3): qty 1

## 2022-01-29 MED ORDER — PANTOPRAZOLE SODIUM 40 MG PO TBEC
40.0000 mg | DELAYED_RELEASE_TABLET | Freq: Every day | ORAL | Status: DC
  Administered 2022-01-29 – 2022-01-31 (×3): 40 mg via ORAL
  Filled 2022-01-29 (×3): qty 1

## 2022-01-29 MED ORDER — SODIUM CHLORIDE (PF) 0.9 % IJ SOLN
10.0000 mg | Freq: Once | INTRAMUSCULAR | Status: AC
  Administered 2022-01-29: 10 mg via INTRAPLEURAL
  Filled 2022-01-29: qty 10

## 2022-01-29 MED ORDER — HYDROMORPHONE HCL 1 MG/ML IJ SOLN
2.0000 mg | Freq: Four times a day (QID) | INTRAMUSCULAR | Status: DC | PRN
  Administered 2022-01-29 – 2022-01-30 (×5): 2 mg via INTRAVENOUS
  Filled 2022-01-29 (×5): qty 2

## 2022-01-29 MED ORDER — NICOTINE 21 MG/24HR TD PT24
21.0000 mg | MEDICATED_PATCH | Freq: Every day | TRANSDERMAL | Status: DC
  Administered 2022-01-29 – 2022-01-31 (×3): 21 mg via TRANSDERMAL
  Filled 2022-01-29 (×3): qty 1

## 2022-01-29 MED ORDER — STERILE WATER FOR INJECTION IJ SOLN
5.0000 mg | Freq: Once | RESPIRATORY_TRACT | Status: AC
  Administered 2022-01-29: 5 mg via INTRAPLEURAL
  Filled 2022-01-29: qty 5

## 2022-01-29 MED ORDER — KETOROLAC TROMETHAMINE 30 MG/ML IJ SOLN
30.0000 mg | Freq: Four times a day (QID) | INTRAMUSCULAR | Status: DC
  Administered 2022-01-29 – 2022-01-30 (×2): 30 mg via INTRAVENOUS
  Filled 2022-01-29 (×3): qty 1

## 2022-01-29 NOTE — Consult Note (Addendum)
? ?   ?[address] ?      [place] [address] ?            [phone]       ? ?Kalman Shan Emmons ?[facility] Medical Record #[id number] ?Date of Birth: 1981/10/09 ? ?Referring: John Giovanni, MD ?Primary Care: Pcp, No ?Primary Cardiologist:Christopher End, MD ? ?Chief Complaint:   Shortness of breath, fatigue ?Reason for consultation: Bilateral pleural effusions, history of CAP ? ? 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 He denied chest or abdominal pain. He was admitted to the ICU because of cardiogenic shock (lactic acidosis and low central venous oxygen saturation) . He was put on Milrinone and Nor epinephrine drips. Echo done 01/18/2022 showed LVEF 20-25%, left moderate pleural effusion, mild MR. Cardiology was consulted. CXR showed new, moderate left pleural effusion, atelectasis, and/or consolidation. CT chest showed ground glass opacities, new small left pleural effusion with atelectasis, mildly enlarged mediastinal lymph nodes (likely reactive), bilateral solid pulmonary nodules (stable since 2022), and marked cardiomegaly and emphysema. He underwent a left thoracentesis and 850 cc of amber fluid was removed.He was also found to have AKI (creatinine 2.53 upon admission) and marked hyperkalemia (potassium 6.1). Cardiac catheterization done 04/07 showed no significant CAD. Ultimately, kidney function improved and he was given Lasix. ?On 01/19/2022 he had a CTA which showed acute PE in the multiple segmental and subsegmental branches in the right lung and significant interval worsening of infiltrates suggestive of multifocal pneumonia. He  was started on a Heparin drip then transitioned to Apixaban for PE. Daily chest  x rays were obtained. He developed an enlarging right para pneumonic pleural effusion and eventually a small left pleural effusion. He was treated initially with Rocephin and Zithromax. Antibiotic was change several times.  Most recent gram stain of sputum showed few yeast, rare gram positive cocci, but no Staph or Pseudomonas seen. He is most recently on Augmentin. A right thoracentesis was done on 01/25/2022 and 200 ml of blood tinged fluid was removed. IR was consulted on 04/12 for loculated right pleural effusion. A 12 French CT guided chest tube was placed by IR on 01/27/2022. Patient was transferred to [facility] for thoracic surgery consult with Dr. Dorris Fetch. ? ?Current Activity/ Functional Status: ?Patient is independent with mobility/ambulation, transfers, ADL's, IADL's. ?  ?Zubrod Score: ?At the time of surgery this patient?s most appropriate activity status/level should be described as: ?[]     0    Normal activity, no symptoms ?[x]     1    Restricted in physical strenuous activity but ambulatory, able to do out light work ?[]     2    Ambulatory and capable of self care, unable to do work activities, up and about more than 50%  Of the time                            ?[]     3    Only limited self care, in bed greater than 50% of waking hours ?[]     4    Completely disabled, no self care, confined to bed or chair ?[]     5  Moribund ? ?Past Medical History:  ?Diagnosis Date  ? Alcohol abuse   ? Asthma   ? CHF (congestive heart failure) (HCC)   ? Depression   ? Hypertension   ? ? ?Past Surgical History:  ?Procedure Laterality Date  ? FRACTURE SURGERY    ? Left leg as a kid  ? RIGHT/LEFT HEART CATH AND CORONARY ANGIOGRAPHY N/A 01/21/2022  ? Procedure: RIGHT/LEFT HEART CATH AND CORONARY ANGIOGRAPHY;  Surgeon: Iran Ouch, MD;  Location: [facility] INVASIVE CV LAB;  Service: Cardiovascular;  Laterality: N/A;  ? ? ?Social History   ? ?Tobacco Use  ?Smoking Status Every Day  ? Packs/day: 1.00  ? Types: Cigarettes  ?Smokeless Tobacco Current  ? Types: Snuff  ?Tobacco Comments  ? Does pouches as welll   ?  ?Social History  ? ?Substance and Sexual Activity  ?Alcohol Use Not Currently  ? Alcohol/week: 7.0 standard drinks  ? Types: 7 Cans of beer per week  ?Recent detox in [place] ? ? ?Allergies  ?Allergen Reactions  ? Lisinopril Swelling  ?  Angioedema ?  ? Shellfish Allergy Swelling  ? ? ?Current Facility-Administered Medications  ?Medication Dose Route Frequency Provider Last Rate Last Admin  ? acetaminophen (TYLENOL) tablet 650 mg  650 mg Oral Q6H PRN John Giovanni, MD      ? Or  ? acetaminophen (TYLENOL) suppository 650 mg  650 mg Rectal Q6H PRN John Giovanni, MD      ? albuterol (PROVENTIL) (2.5 MG/3ML) 0.083% nebulizer solution 2.5 mg  2.5 mg Nebulization Q6H PRN John Giovanni, MD      ? amoxicillin-clavulanate (AUGMENTIN) 875-125 MG per tablet 1 tablet  1 tablet Oral Q12H John Giovanni, MD      ? atorvastatin (LIPITOR) tablet 10 mg  10 mg Oral Daily John Giovanni, MD      ? carvedilol (COREG) tablet 3.125 mg  3.125 mg Oral BID WC John Giovanni, MD   3.125 mg at 01/29/22 2957  ? dapagliflozin propanediol (FARXIGA) tablet 10 mg  10 mg Oral Daily John Giovanni, MD      ? digoxin (LANOXIN) tablet 0.125 mg  0.125 mg Oral Daily John Giovanni, MD      ? furosemide (LASIX) tablet 40 mg  40 mg Oral Daily John Giovanni, MD      ? HYDROmorphone (DILAUDID) injection 2 mg  2 mg Intravenous Q6H PRN John Giovanni, MD   2 mg at 01/29/22 0736  ? losartan (COZAAR) tablet 12.5 mg  12.5 mg Oral Daily John Giovanni, MD      ? naloxone [facility]) injection 0.4 mg  0.4 mg Intravenous PRN John Giovanni, MD      ? nicotine (NICODERM CQ - dosed in mg/24 hours) patch 21 mg  21 mg Transdermal Daily John Giovanni, MD      ? oxyCODONE (Oxy IR/ROXICODONE) immediate release tablet 5 mg  5 mg Oral Q6H PRN  John Giovanni, MD   5 mg at 01/29/22 0404  ? pantoprazole (PROTONIX) EC tablet 40 mg  40 mg Oral Daily John Giovanni, MD      ? spironolactone (ALDACTONE) tablet 25 mg  25 mg Oral Daily John Giovanni, MD      ? ? ?Medications Prior to Admission  ?Medication Sig Dispense Refill Last Dose  ? amoxicillin-clavulanate (AUGMENTIN) 875-125 MG tablet Take 1 tablet by mouth every 12 (twelve) hours for 6 days. 12 tablet 0   ? apixaban (ELIQUIS) 5 MG TABS tablet Take 2 tablets (10 mg total) by mouth  2 (two) times daily. Starting on 01/30/22-- take 1 tablet (5 mg) twice daily. 60 tablet 1   ? atorvastatin (LIPITOR) 10 MG tablet Take 1 tablet (10 mg total) by mouth once daily. 30 tablet 0   ? carvedilol (COREG) 3.125 MG tablet Take 1 tablet (3.125 mg total) by mouth 2 (two) times daily with a meal. 60 tablet 1   ? dapagliflozin propanediol (FARXIGA) 10 MG TABS tablet Take 1 tablet (10 mg total) by mouth once daily. 30 tablet 0   ? digoxin (LANOXIN) 0.125 MG tablet Take 1 tablet (0.125 mg total) by mouth once daily. 30 tablet 1   ? folic acid (FOLVITE) 1 MG tablet Take 1 tablet (1 mg total) by mouth once daily. 30 tablet 0   ? furosemide (LASIX) 40 MG tablet Take 1 tablet (40 mg total) by mouth once daily. 90 tablet 0   ? losartan (COZAAR) 25 MG tablet Take 1/2 tablet (12.5 mg total) by mouth once daily. 30 tablet 1   ? Multiple Vitamin (MULTIVITAMIN WITH MINERALS) TABS tablet Take 1 tablet by mouth daily. 90 tablet 0   ? nicotine (NICODERM CQ - DOSED IN MG/24 HOURS) 21 mg/24hr patch Place 1 patch (21 mg total) onto the skin daily. 28 patch 0   ? oxyCODONE (OXY IR/ROXICODONE) 5 MG immediate release tablet Take 1 tablet (5 mg total) by mouth every 6 (six) hours as needed for severe pain or moderate pain. 5 tablet 0   ? pantoprazole (PROTONIX) 40 MG tablet Take 1 tablet (40 mg total) by mouth daily. 30 tablet 0   ? senna-docusate (SENOKOT-S) 8.6-50 MG tablet Take 1 tablet by mouth 2 (two) times daily. 30 tablet 0   ?  spironolactone (ALDACTONE) 25 MG tablet Take 1 tablet (25 mg total) by mouth once daily. 30 tablet 1   ? ? ?Family History  ?Problem Relation Age of Onset  ? Colon cancer Neg Hx   ? Esophageal cancer Neg H

## 2022-01-29 NOTE — Plan of Care (Signed)
  Problem: Education: Goal: Knowledge of General Education information will improve Description: Including pain rating scale, medication(s)/side effects and non-pharmacologic comfort measures Outcome: Progressing   Problem: Clinical Measurements: Goal: Ability to maintain clinical measurements within normal limits will improve Outcome: Progressing Goal: Cardiovascular complication will be avoided Outcome: Progressing   

## 2022-01-29 NOTE — Assessment & Plan Note (Signed)
Patient with history of alcohol abuse recently underwent detox in [place].  He had no signs of withdrawal during his hospitalization at [facility]. ?

## 2022-01-29 NOTE — Progress Notes (Signed)
? ?   ?  [address] ?      [place] [address] ?            [phone]   ? ?  ?Notified of increased pain after thrombolytics ?Vitals normal except BP a little high ?~ 400 of serosanguinous fluid from tube since unclamped ?C/o severe pain. Already received Tylenol, oxycodone and dilaudid IV ?On low dose of oxycodone- will increase ?Will give Toradol x 24 hours as well ?CXR shows some improvement ?Pain likely from CT movement/ pleural irritation  ? ?Revonda Standard Roxan Hockey, MD ?[facility] ?[phone] ? ?

## 2022-01-29 NOTE — Progress Notes (Addendum)
Patient agreeable to having thrombolytics placed into right chest tube. I obtained 2 syringes (thrombolytics) from pharmacy around 1 pm. Right chest tube was taken off suction. Pigtail chest tube valve was turned off and chest tube was disconnected, being cautious not to allow outside air into chest tube. Both thrombolytic syringes carefully placed into right pigtail chest tube. Pigtail chest tube valve turned back to off and Pleura Vac tube clamped (blue mechanism). Suction remains off to chest tube. Nurse instructed to leave as is until about 1515. She is then to turn the pigtail valve so that it is in the on position, unclamp blue mechanism, and turn suction to on. Patient tolerated procedure well. Of note, Pleura Vac has been previously knocked over and there is drainage in all 2 chambers. ?

## 2022-01-29 NOTE — Hospital Course (Signed)
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 ?Recently underwent alcohol detox in [place] ?4/3: Presented to [facility] + MOD S2/2 septic + cardiogenic shock-ICU --milrinone by cardiology and plans for left right heart cath noted per Dr. Serita Kyle note ?4/4: left-sided pleural effusion drained 850 cc culture negative malignancy negative ?4/7: Cardiac cath EF 15% with 40% LAD lesion--felt to be nonischemic cardiomyopathy-RHC = mild pulmonary HTN normal CO ?4/10 CXR right-sided infiltrates parapneumonic effusion ?4/11: US guided thoracentesis 200 cc + severe loculations ?4/13 CT-guided pleural drain placement  ?4/14: Pulmonology recommending transfer to [facility] for CVTS input ? ?Data = BUN/creatinine 8/0.7, WBC 11 platelet 784 hemoglobin 10.4 up from 8-9 range ?CT chest 4/15 = stable cardiomegaly pulmonary hypertension small right pleural effusion persisting partially loculated right major fissure medial pleural space with loculated gas around catheter-improving multifocal consolidation + evolving pulmonary infarcts?  Multifocal pneumonia ? ? ?

## 2022-01-29 NOTE — Assessment & Plan Note (Addendum)
Nonischemic cardiomyopathy ?Recent echo done 4/4 showing EF 20 to 25%.  Right and left heart cath done 4/7 without evidence of obstructive CAD.  No signs of volume overload at this time. ?-Continue Coreg, digoxin, Lasix, losartan, spironolactone, and Farxiga. ?

## 2022-01-29 NOTE — Assessment & Plan Note (Signed)
CTA chest on 4/5 showing acute PE in multiple segmental and subsegmental branches in the right lung; small thrombus burden.  Patient was initially treated with IV heparin then switched to Eliquis.  Discussed with pharmacy, he already received Eliquis at 10 PM tonight at [facility]. ?-Continue Eliquis in the morning if okay with CT surgery. ?

## 2022-01-29 NOTE — Progress Notes (Signed)
Patient expressed he would like to do thrombolytic. Asking if diet could be advanced to solid foods. Hospital informed, Cardiothoracic team notified.  ?

## 2022-01-29 NOTE — Progress Notes (Signed)
?PROGRESS NOTE ? ? 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 ?Recently underwent alcohol detox in [place] ?4/3: Presented to [facility] + MOD S2/2 septic + cardiogenic shock-ICU --milrinone by cardiology and plans for left right heart cath noted per Dr. Darnelle Bos note ?4/4: left-sided pleural effusion drained 850 cc culture negative malignancy negative ?4/5: Pulmonary emboli noted ?4/7: Cardiac cath EF 15% with 40% LAD lesion--felt to be nonischemic cardiomyopathy-RHC = mild pulmonary HTN normal CO ?4/10 CXR right-sided infiltrates parapneumonic effusion ?4/11: US guided thoracentesis 200 cc + severe loculations ?4/13 CT-guided pleural drain placement  ?4/14: Pulmonology recommending transfer to [facility] for [facility] input ? ?Data = BUN/creatinine 8/0.7, WBC 11 platelet 784 hemoglobin 10.4 up from 8-9 range ?CT chest 4/15 = stable cardiomegaly pulmonary hypertension small right pleural effusion persisting partially loculated right major fissure medial pleural space with loculated gas around catheter-improving multifocal consolidation + evolving pulmonary infarcts?  Multifocal pneumonia ? ? ?Hospital-Problem based course ? ?Multifocal consolidation + loculated pleural effusion left side which is exudative ?Seen by CVTS 4/15 nonoperable but interventional candidate ?Candidate for tPA's dornase and appreciate Dr. Roxan Hockey input-chest tube to suction ?ABX started 4/3 continues on Augmentin  and expect stop date will be at least 3 weeks ending 02/06/2022 ?CXR 4/16 periodically white count overall is trending down no overt fevers although low-grade Tmax 99 overnight ?Pain control oxycodone first choice Dilaudid for severe pain ?Cardiac cath EF 15 to 99991111 alcoholic cardiomyopathy ?HFrEF acute this admission now resolved ? 40% LAD lesion ?Nonischemic cardiomyopathy + mild pulmonary hypertension ?Cardiogenic shock physiology has resolved from 4/3 and was present on last admission ?Counseled at length regarding EtOH cessation which is the cause ?Current GDMT: Coreg 3.125 digoxin 0.125 losartan 12.5 Aldactone 25 ?Continue Lasix 40 daily orally as is euvolemic ?Farxiga 10 daily although not ischemic cardiomyopathy-not sure of evidence ?Pulmonary emboli 4/5 multiple areas right lung ?Resume Eliquis 10 twice daily and then transition to 5 twice daily from 4/16 ?Alcoholism last drink 8 to 10 days prior to admission detox in [place] ?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 ? ?DVT prophylaxis: Eliquis ?Code Status: Full ?Family Communication: None ?Disposition:  ?Status is: Inpatient ?Remains inpatient appropriate because:  ? ?Not ready for discharge has chest tube ?  ?Consultants:  ?CVTS ? ?Procedures:  ? ?Antimicrobials: Augmentin 4/23. ? ? ?Subjective: ? ?Awake coherent no distress no chest pain no fever no chills ?No nausea no vomiting ?Has not been up out of bed ? ? ?Objective: ?Vitals:  ? 01/29/22 0500 01/29/22 0600 01/29/22 0700 01/29/22 0920  ?BP: 127/75 109/79 117/77   ?Pulse:    87  ?Resp:      ?Temp:      ?TempSrc:      ?SpO2:      ? ? ?Intake/Output Summary (Last 24 hours) at 01/29/2022 1110 ?Last data filed at 01/29/2022 1010 ?Gross per 24  hour  ?Intake --  ?Output 780 ml  ?Net -780 ml   ? ?There were no vitals filed for this visit. ? ?Examination: ? ?EOMI NCAT no focal deficit no icterus no pallor ?Chest clear no added sound ?Air entry relatively equal bilaterally he has equal percussion on both sides on the right side he has chest tube in place ?Abdomen is soft cannot appreciate liver no lower extremity edema neurologically intact ? ?Data Reviewed: personally reviewed  ? ?CBC ?   ?Component Value Date/Time  ? WBC 11.8 (H) 01/29/2022 0406  ? RBC 3.77 (L) 01/29/2022 0406  ? HGB 10.2 (L) 01/29/2022 0406  ? HCT 30.3 (L) 01/29/2022 0406  ? PLT 784 (H) 01/29/2022 0406  ? MCV 80.4 01/29/2022 0406  ? MCH 27.1 01/29/2022 0406  ? MCHC 33.7 01/29/2022 0406  ? RDW 18.6 (H) 01/29/2022 0406  ? LYMPHSABS 2.9 06/21/2021 0435  ? MONOABS 0.7 06/21/2021 0435  ? EOSABS 0.1 06/21/2021 0435  ? BASOSABS 0.1 06/21/2021 0435  ? ? ?  Latest Ref Rng & Units 01/29/2022  ?  4:06 AM 01/28/2022  ?  5:41 AM 01/26/2022  ?  4:43 AM  ?CMP  ?Glucose 70 - 99 mg/dL 113   119   122    ?BUN 6 - 20 mg/dL 8   11   14     ?Creatinine 0.61 - 1.24 mg/dL 0.78   0.81   1.05    ?Sodium 135 - 145 mmol/L 137   138   137    ?Potassium 3.5 - 5.1 mmol/L 4.3   4.0   4.1    ?Chloride 98 - 111 mmol/L 102   101   99    ?CO2 22 - 32 mmol/L 27   28   26     ?Calcium 8.9 - 10.3 mg/dL 9.2   8.9   9.2    ?Total Protein 6.5 - 8.1 g/dL  6.9     ?Total Bilirubin 0.3 - 1.2 mg/dL  0.7     ?Alkaline Phos 38 - 126 U/L  174     ?AST 15 - 41 U/L  23     ?ALT 0 - 44 U/L  24     ? ? ? ?Radiology Studies: ?DG Chest 2 View ? ?Result Date: 01/28/2022 ?CLINICAL DATA:  Pleural effusion follow-up EXAM: CHEST - 2 VIEW COMPARISON:  01/25/2022 FINDINGS: Similar moderate right pleural effusion and associated atelectasis. Increased trace left pleural effusion. Similar additional patchy right lung and left mid lung opacities. Stable cardiomegaly. IMPRESSION: Similar moderate right pleural effusion. Increased trace left pleural effusion. Similar right greater than left lung opacities.  Electronically Signed   By: Macy Mis M.D.   On: 01/28/2022 08:15  ? ?CT CHEST WO CONTRAST ? ?Result Date: 01/29/2022 ?CLINICAL DATA:  Pleural effusion EXAM: CT CHEST WITHOUT CONTRAST TECHNIQUE: Multidetector CT imaging of the chest was performed following the standard protocol without IV contrast. RADIATION DOSE REDUCTION: This exam was performed according to the departmental dose-optimization program which includes automated exposure control, adjustment of the mA and/or kV according to patient size and/or use of iterative reconstruction technique. COMPARISON:  CT 01/19/2022, drainage procedure 01/27/2022 FINDINGS: Cardiovascular: Cardiomegaly is stable. Moderate coronary artery calcification. No pericardial effusion. Central pulmonary arteries are enlarged suggesting changes of pulmonary arterial hypertension. The thoracic aorta is unremarkable. Mediastinum/Nodes: Visualized thyroid is unremarkable. There is extensive shotty mediastinal and at least right hilar adenopathy, slightly progressive since prior examination, likely reactive in nature. No frankly pathologic  thoracic adenopathy identified. The esophagus is unremarkable. Lungs/Pleura: Interval placement of a right basilar pigtail pleural drainage catheter within the posteroinferior pleural space. There is loculated gas surrounding the Cope loop component of the drainage catheter suggesting viscous or loculated pleural fluid. A small pleural effusion is present within the posteroinferior right pleural space with the pleural drainage catheter in appropriate position. A small amount of pleural fluid does, however, appear loculated superiorly within the major fissure as well as medially within the infrahilar region. Mild paraseptal emphysema. There is multifocal consolidation, more prevalent within the right lower lobe in keeping with multiple evolving pulmonary infarcts and/or multifocal pneumonic consolidation. The pulmonary infiltrates have improved in  the interval since prior examination. Small left pleural effusion is unchanged. No pneumothorax. Upper Abdomen: No acute abnormality. Musculoskeletal: No chest wall mass or suspicious bone lesions identified.

## 2022-01-29 NOTE — Assessment & Plan Note (Signed)
-  Continue Lipitor °

## 2022-01-29 NOTE — Progress Notes (Signed)
Pt arrived from [facility] with chest tube in place. CT connected to -20cm water suction.Vital signs are stable. Pt complains of 8-9 right chest pain. Admitting called for admission Md.  Awaiting orders for pain medicine ?

## 2022-01-29 NOTE — Progress Notes (Signed)
Patient complaining of increased pain. Offered to give tylenol. Educated patient that cramping is to be expected sometimes. Per patient " I having sharp pains when I breathe". VSS: HR 95, SpO2 94%, 145/87. 400 output from chest tube so far. Patient asking if he can have something stronger. Cardiothoracic paged- MD informed of patient's current pain regimen. MD informed patient received IV dilaudid 2mg  at 1408. MD ok with patients current pain regimen. Gave verbal for chest xray.  ? ?Patient informed. Stated "they can't give me anything else for pain?!" Patient informed he can get oxycodone at 1605. ?

## 2022-01-29 NOTE — Assessment & Plan Note (Signed)
No signs of acute exacerbation. ?-Albuterol as needed ?

## 2022-01-29 NOTE — Progress Notes (Signed)
Per patient, he does not want updates to be given to anyone who calls. States if anyone calls, refer them to talk with patient.   ?

## 2022-01-29 NOTE — Assessment & Plan Note (Signed)
Continue Protonix °

## 2022-01-29 NOTE — Assessment & Plan Note (Addendum)
Complex right-sided pleural effusion with history of CAP ?- 4/11 underwent ultrasound-guided thoracentesis with only 200 mL of fluid removed given collection was severely loculated concerning for empyema or complicated effusion, culture negative so far.   ?-4/13 underwentCT guided pleural drain placement ?-4/14 chest x-ray showing no improvement and transferred to [facility] for cardiothoracic surgery evaluation. ?-Please consult CT surgery in the morning. ?-Keep n.p.o. ?-Vital signs currently stable.  Not febrile, tachycardic, tachypneic, or hypoxic. ? WBC count 16.8 on 4/12.  Repeat CBC ordered. ?-Continue Augmentin ?-Pain management: Continue Dilaudid 2 mg every 6 hours as needed and oxycodone IR 5 mg every 6 hours as needed. ?-Repeat CT chest without contrast ?-Continuous pulse ox, supplemental oxygen as needed to keep oxygen saturation above 92% ? ?Left-sided pleural effusion ?-4/4 underwent thoracentesis with 850 mL removed, culture negative, cytology negative for malignancy.  ?

## 2022-01-29 NOTE — Progress Notes (Signed)
Chest tube output marked at 415mL before beginning of shift. Accidental knocked down chest tube by patient, output spilled over to 3rd collection chamber at 1250. Patient informed to use caution.  ?

## 2022-01-29 NOTE — Assessment & Plan Note (Signed)
Stable. ?-Continue Coreg, Lasix, losartan, and spironolactone. ?

## 2022-01-29 NOTE — Assessment & Plan Note (Signed)
-  NicoDerm patch and counseling ?

## 2022-01-30 ENCOUNTER — Inpatient Hospital Stay (HOSPITAL_COMMUNITY): Payer: Medicaid Other

## 2022-01-30 LAB — CBC
HCT: 28.2 % — ABNORMAL LOW (ref 39.0–52.0)
Hemoglobin: 9.2 g/dL — ABNORMAL LOW (ref 13.0–17.0)
MCH: 26.5 pg (ref 26.0–34.0)
MCHC: 32.6 g/dL (ref 30.0–36.0)
MCV: 81.3 fL (ref 80.0–100.0)
Platelets: 746 10*3/uL — ABNORMAL HIGH (ref 150–400)
RBC: 3.47 MIL/uL — ABNORMAL LOW (ref 4.22–5.81)
RDW: 18.8 % — ABNORMAL HIGH (ref 11.5–15.5)
WBC: 11.1 10*3/uL — ABNORMAL HIGH (ref 4.0–10.5)
nRBC: 0.3 % — ABNORMAL HIGH (ref 0.0–0.2)

## 2022-01-30 LAB — RETICULOCYTES
Immature Retic Fract: 40.1 % — ABNORMAL HIGH (ref 2.3–15.9)
RBC.: 3.57 MIL/uL — ABNORMAL LOW (ref 4.22–5.81)
Retic Count, Absolute: 75 10*3/uL (ref 19.0–186.0)
Retic Ct Pct: 2.1 % (ref 0.4–3.1)

## 2022-01-30 LAB — COMPREHENSIVE METABOLIC PANEL
ALT: 21 U/L (ref 0–44)
AST: 22 U/L (ref 15–41)
Albumin: 2.1 g/dL — ABNORMAL LOW (ref 3.5–5.0)
Alkaline Phosphatase: 157 U/L — ABNORMAL HIGH (ref 38–126)
Anion gap: 9 (ref 5–15)
BUN: 9 mg/dL (ref 6–20)
CO2: 26 mmol/L (ref 22–32)
Calcium: 8.8 mg/dL — ABNORMAL LOW (ref 8.9–10.3)
Chloride: 101 mmol/L (ref 98–111)
Creatinine, Ser: 1 mg/dL (ref 0.61–1.24)
GFR, Estimated: >60 mL/min (ref >60)
Glucose, Bld: 122 mg/dL — ABNORMAL HIGH (ref 70–99)
Potassium: 4.2 mmol/L (ref 3.5–5.1)
Sodium: 136 mmol/L (ref 135–145)
Total Bilirubin: 0.5 mg/dL (ref 0.3–1.2)
Total Protein: 6.5 g/dL (ref 6.5–8.1)

## 2022-01-30 LAB — IRON AND TIBC
Iron: 21 ug/dL — ABNORMAL LOW (ref 45–182)
Saturation Ratios: 8 % — ABNORMAL LOW (ref 17.9–39.5)
TIBC: 269 ug/dL (ref 250–450)
UIBC: 248 ug/dL

## 2022-01-30 LAB — FERRITIN: Ferritin: 146 ng/mL (ref 24–336)

## 2022-01-30 LAB — FOLATE: Folate: 22 ng/mL (ref >5.9)

## 2022-01-30 LAB — VITAMIN B12: Vitamin B-12: 950 pg/mL — ABNORMAL HIGH (ref 180–914)

## 2022-01-30 MED ORDER — FERROUS SULFATE 325 (65 FE) MG PO TABS
325.0000 mg | ORAL_TABLET | Freq: Two times a day (BID) | ORAL | Status: DC
  Administered 2022-01-30 – 2022-02-01 (×4): 325 mg via ORAL
  Filled 2022-01-30 (×4): qty 1

## 2022-01-30 MED ORDER — HYDROMORPHONE HCL 2 MG PO TABS
2.0000 mg | ORAL_TABLET | ORAL | Status: DC | PRN
  Administered 2022-01-30 – 2022-01-31 (×5): 2 mg via ORAL
  Filled 2022-01-30 (×6): qty 1

## 2022-01-30 MED ORDER — KETOROLAC TROMETHAMINE 30 MG/ML IJ SOLN
30.0000 mg | Freq: Four times a day (QID) | INTRAMUSCULAR | Status: DC
  Administered 2022-01-30 (×2): 30 mg via INTRAVENOUS
  Filled 2022-01-30 (×2): qty 1

## 2022-01-30 NOTE — Progress Notes (Signed)
At bedside for PIV start. PT bathing, requested to return later. ?

## 2022-01-30 NOTE — Plan of Care (Signed)
  Problem: Education: Goal: Knowledge of General Education information will improve Description Including pain rating scale, medication(s)/side effects and non-pharmacologic comfort measures Outcome: Progressing   Problem: Health Behavior/Discharge Planning: Goal: Ability to manage health-related needs will improve Outcome: Progressing   

## 2022-01-30 NOTE — Progress Notes (Signed)
?PROGRESS NOTE ? ? 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 ?Recently underwent alcohol detox in [place] ?4/3: Presented to [facility] + MOD S2/2 septic + cardiogenic shock-ICU --milrinone by cardiology and plans for left right heart cath noted per Dr. Darnelle Bos note ?4/4: left-sided pleural effusion drained 850 cc culture negative malignancy negative ?4/5: Pulmonary emboli noted ?4/7: Cardiac cath EF 15% with 40% LAD lesion--felt to be nonischemic cardiomyopathy-RHC = mild pulmonary HTN normal CO ?4/10 CXR right-sided infiltrates parapneumonic effusion ?4/11: US guided thoracentesis 200 cc + severe loculations ?4/13 CT-guided pleural drain placement  ?4/14: Pulmonology recommending transfer [facility] for [facility] input ?4/15: CVTS consulted recommending nonoperative management but TKnase ? ?Data = BUN/creatinine 8/0.7, WBC 11 platelet 784 hemoglobin 10.4 up from 8-9 range ?CT chest 4/15 = stable cardiomegaly pulmonary hypertension small right pleural effusion persisting partially loculated right major fissure medial pleural space with loculated gas around catheter-improving multifocal consolidation + evolving pulmonary infarcts?  Multifocal pneumonia ? ? ?Hospital-Problem based course ? ?Multifocal consolidation + loculated pleural effusion left side which is exudative ?Seen by CVTS 4/15 nonoperable but interventional candidate ?Appreciate cardiothoracic's input-?  Remove chest tube  a.m. based on a.m. chest x-ray per them ?ABX started 4/3 continues on Augmentin and expect stop date will be at least 3 weeks ending 02/06/2022 ?Pain is moderate-he gets no benefit from hydrocodone-I have switched him to p.o. Dilaudid as first choice with IV Dilaudid for severe pain and he should continue Toradol in addition for breakthrough ?Cardiac cath EF 15 to 99991111 alcoholic cardiomyopathy ?HFrEF acute this admission now resolved ? 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 iron only ? ? ?DVT prophylaxis: Eliquis ?Code Status: Full ?Family Communication: None ?Disposition:  ?Status is: Inpatient ?Remains inpatient appropriate because:  ? ?Not ready for discharge has chest tube ?  ?Consultants:  ?CVTS ? ?Procedures:  ? ?Antimicrobials: Augmentin 4/23. ? ? ?Subjective: ? ?Awake  ?Seems improved no chest pain no fever ? ?Objective: ?Vitals:  ? 01/30/22 0410 01/30/22 0453 01/30/22 0700 01/30/22 1203  ?BP: 122/81  122/86 125/82  ?Pulse: 84   89  ?Resp:    17  ?Temp:    97.9 ?F (36.6 ?C)  ?TempSrc:    Oral  ?SpO2: 98%   94%  ?Weight:  87.9 kg    ? ? ?Intake/Output Summary (Last 24 hours) at 01/30/2022 1213 ?Last data filed at 01/30/2022 1205 ?Gross per  24 hour  ?Intake 240 ml   ?Output 1325 ml  ?Net -1085 ml  ? ? ?Filed Weights  ? 01/30/22 0453  ?Weight: 87.9 kg  ? ? ?Examination: ? ?EOMI NCAT no focal deficit no icterus no pallor ?Chest clear no added sound ?And she is equal bilaterally chest tube is in place ?Abdomen soft ?Pleasant ? ?Data Reviewed: personally reviewed  ? ?CBC ?   ?Component Value Date/Time  ? WBC 11.1 (H) 01/30/2022 0431  ? RBC 3.57 (L) 01/30/2022 0431  ? RBC 3.47 (L) 01/30/2022 0431  ? HGB 9.2 (L) 01/30/2022 0431  ? HCT 28.2 (L) 01/30/2022 0431  ? PLT 746 (H) 01/30/2022 0431  ? MCV 81.3 01/30/2022 0431  ? MCH 26.5 01/30/2022 0431  ? MCHC 32.6 01/30/2022 0431  ? RDW 18.8 (H) 01/30/2022 0431  ? LYMPHSABS 2.9 06/21/2021 0435  ? MONOABS 0.7 06/21/2021 0435  ? EOSABS 0.1 06/21/2021 0435  ? BASOSABS 0.1 06/21/2021 0435  ? ? ?  Latest Ref Rng & Units 01/30/2022  ?  4:31 AM 01/29/2022  ?  4:06 AM 01/28/2022  ?  5:41 AM  ?CMP  ?Glucose 70 - 99 mg/dL 122   113   119    ?BUN 6 - 20 mg/dL 9   8   11     ?Creatinine 0.61 - 1.24 mg/dL 1.00   0.78   0.81    ?Sodium 135 - 145 mmol/L 136   137   138    ?Potassium 3.5 - 5.1 mmol/L 4.2   4.3   4.0    ?Chloride 98 - 111 mmol/L 101   102   101    ?CO2 22 - 32 mmol/L 26   27   28     ?Calcium 8.9 - 10.3 mg/dL 8.8   9.2   8.9    ?Total Protein 6.5 - 8.1 g/dL 6.5    6.9    ?Total Bilirubin 0.3 - 1.2 mg/dL 0.5    0.7    ?Alkaline Phos 38 - 126 U/L 157    174    ?AST 15 - 41 U/L 22    23    ?ALT 0 - 44 U/L 21    24    ? ? ? ?Radiology Studies: ?CT CHEST WO CONTRAST ? ?Result Date: 01/29/2022 ?CLINICAL DATA:  Pleural effusion EXAM: CT CHEST WITHOUT CONTRAST TECHNIQUE: Multidetector CT imaging of the chest was performed following the standard protocol without IV contrast. RADIATION DOSE REDUCTION: This exam was performed according to the departmental dose-optimization program which includes automated exposure control, adjustment of the mA and/or kV according to patient size and/or use of iterative reconstruction technique. COMPARISON:  CT 01/19/2022,  drainage procedure 01/27/2022 FINDINGS: Cardiovascular: Cardiomegaly is stable. Moderate coronary artery calcification. No pericardial effusion. Central pulmonary arteries are enlarged suggesting changes of pulmonary arterial hypertension. The thoracic aorta is unremarkable. Mediastinum/Nodes: Visualized thyroid is unremarkable. There is extensive shotty mediastinal and at least right hilar adenopathy, slightly progressive since prior examination, likely reactive in nature. No frankly pathologic thoracic adenopathy identified. The esophagus is unremarkable. Lungs/Pleura: Interval placement of a right basilar pigtail pleural drainage catheter within the posteroinferior pleural space. There is loculated gas surrounding the Cope loop component of the drainage catheter suggesting viscous or loculated pleural fluid. A small pleural effusion is present within the posteroinferior right pleural space with the pleural drainage catheter in appropriate position. A small amount of pleural fluid does, however, appear loculated superiorly within the major fissure as well  as medially within the infrahilar region. Mild paraseptal emphysema. There is multifocal consolidation, more prevalent within the right lower lobe in keeping with multiple evolving pulmonary infarcts and/or multifocal pneumonic consolidation. The pulmonary infiltrates have improved in the interval since prior examination. Small left pleural effusion is unchanged. No pneumothorax. Upper Abdomen: No acute abnormality. Musculoskeletal: No chest wall mass or suspicious bone lesions identified. IMPRESSION: Stable cardiomegaly. Morphologic changes in keeping with pulmonary arterial hypertension. Moderate coronary artery calcification. Interval right chest tube placement with partial evacuation of right pleural effusion. Small right pleural effusion persists, partially loculated within the right major fissure and along the medial pleural space. Loculated gas surrounding  the pleural drainage catheter suggests viscus or loculated pleural fluid. Improving multifocal pulmonary consolidation in keeping with either evolving pulmonary infarcts or improving multifocal pneumonic co

## 2022-01-30 NOTE — Progress Notes (Addendum)
? ?   ?  [address] ?      [place] [address] ?            [phone]   ? ?  ? ?   ? ?Subjective: ?Patient's pain better this am than yesterday afternoon;although, he still has pain from right chest tube ? ?Objective: ?Vital signs in last 24 hours: ?Temp:  [97.8 ?F (36.6 ?C)-98.3 ?F (36.8 ?C)] 97.8 ?F (36.6 ?C) (04/16 0010) ?Pulse Rate:  [84-94] 84 (04/16 0410) ?Cardiac Rhythm: Normal sinus rhythm (04/16 0700) ?BP: (116-145)/(69-87) 122/86 (04/16 0700) ?SpO2:  [93 %-98 %] 98 % (04/16 0410) ?Weight:  [87.9 kg] 87.9 kg (04/16 0453) ? ?  ? ?Intake/Output from previous day: ?04/15 0701 - 04/16 0700 ?In: 720 [P.O.:720] ?Out: 1500 [Urine:600; Chest Tube:900] ? ? ?Physical Exam: ? ?Cardiovascular: RRR ?Pulmonary: Clear to auscultation on left and slightly diminished right base ?Extremities:No lower extremity edema. ?Chest Tube: to suction, no air leak ? ?Lab Results: ?CBC: ?Recent Labs  ?  01/29/22 ?0406 01/30/22 ?0431  ?WBC 11.8* 11.1*  ?HGB 10.2* 9.2*  ?HCT 30.3* 28.2*  ?PLT 784* 746*  ? ?BMET:  ?Recent Labs  ?  01/29/22 ?0406 01/30/22 ?0431  ?NA 137 136  ?K 4.3 4.2  ?CL 102 101  ?CO2 27 26  ?GLUCOSE 113* 122*  ?BUN 8 9  ?CREATININE 0.78 1.00  ?CALCIUM 9.2 8.8*  ?  ?PT/INR: No results for input(s): LABPROT, INR in the last 72 hours. ?ABG:  ?INR: ?Will add last result for INR, ABG once components are confirmed ?Will add last 4 CBG results once components are confirmed ? ?Assessment/Plan: ? ?1. CV - SR. On Coreg 3.125 mg bid and Losartan 12.5 mg daily ?2.  Pulmonary - History of COPD. He has a complex right pleural effusion. On room air. Right chest tube with 900 cc of output last 24 hours. Chest tube is to suction, no air leak. CXR this am appears stable. I marked pleura vac, but may need to be changed in next 24 hours. Check CXR in am. ?3. Anemia-H and H this am 9.2 and 28.2. Per primary ?4. History of right PE-ok to restart Apixaban today ?5. History of acute on chronic HFrEF and non ischemic  cardiomyopathy (possibly alcohol related)-on on Spironolactone 25 mg daily, Digoxin 0.125 mg daily, and Farxiga 10 mg daily ?6. Regarding pain control, scheduled Toradol, Tylenol PRN and Oxy PRN ?7. ID-on Augmentin  ?Sharalyn Ink ZimmermanPA-C ?01/30/2022,8:42 AM ?[phone]  ? ?Good result with thrombolytics ?CXR still has some opacity at right base, I suspect majority is consolidated lung, may be a small amount of loculated fluid left but I think any benefit to additional thrombolytics is marginal. ?Would resume anticoagulation for PE ?Will leave tube in today, possibly dc tomorrow ? ?Revonda Standard Roxan Hockey, MD ?[facility] ?[phone] ? ?He has been started back on Eliquis for PE, will stop Toradol due to risk of GI bleeding ? ?Revonda Standard Roxan Hockey, MD ?[facility] ?[phone] ? ?

## 2022-01-31 ENCOUNTER — Inpatient Hospital Stay (HOSPITAL_COMMUNITY): Payer: Medicaid Other

## 2022-01-31 LAB — COMPREHENSIVE METABOLIC PANEL
ALT: 17 U/L (ref 0–44)
AST: 20 U/L (ref 15–41)
Albumin: 2.2 g/dL — ABNORMAL LOW (ref 3.5–5.0)
Alkaline Phosphatase: 141 U/L — ABNORMAL HIGH (ref 38–126)
Anion gap: 10 (ref 5–15)
BUN: 9 mg/dL (ref 6–20)
CO2: 23 mmol/L (ref 22–32)
Calcium: 9 mg/dL (ref 8.9–10.3)
Chloride: 103 mmol/L (ref 98–111)
Creatinine, Ser: 1.09 mg/dL (ref 0.61–1.24)
GFR, Estimated: >60 mL/min (ref >60)
Glucose, Bld: 107 mg/dL — ABNORMAL HIGH (ref 70–99)
Potassium: 4.5 mmol/L (ref 3.5–5.1)
Sodium: 136 mmol/L (ref 135–145)
Total Bilirubin: 0.4 mg/dL (ref 0.3–1.2)
Total Protein: 6.5 g/dL (ref 6.5–8.1)

## 2022-01-31 LAB — CBC
HCT: 26.8 % — ABNORMAL LOW (ref 39.0–52.0)
Hemoglobin: 8.8 g/dL — ABNORMAL LOW (ref 13.0–17.0)
MCH: 26.7 pg (ref 26.0–34.0)
MCHC: 32.8 g/dL (ref 30.0–36.0)
MCV: 81.5 fL (ref 80.0–100.0)
Platelets: 745 10*3/uL — ABNORMAL HIGH (ref 150–400)
RBC: 3.29 MIL/uL — ABNORMAL LOW (ref 4.22–5.81)
RDW: 18.5 % — ABNORMAL HIGH (ref 11.5–15.5)
WBC: 9.1 10*3/uL (ref 4.0–10.5)
nRBC: 0 % (ref 0.0–0.2)

## 2022-01-31 NOTE — Progress Notes (Addendum)
? ?   ?[address] ?      [place] [address] ?            [phone]   ? ?     ?Subjective: ?Feeling better ? ?Objective: ?Vital signs in last 24 hours: ?Temp:  [97.9 ?F (36.6 ?C)-98.5 ?F (36.9 ?C)] 98.4 ?F (36.9 ?C) (04/17 0741) ?Pulse Rate:  [85-89] 87 (04/17 0741) ?Cardiac Rhythm: Normal sinus rhythm;Heart block;Other (Comment) (04/16 1904) ?Resp:  [17-20] 20 (04/17 0741) ?BP: (123-132)/(82-93) 132/86 (04/17 0741) ?SpO2:  [94 %-99 %] 99 % (04/17 0741) ?Weight:  [88.1 kg] 88.1 kg (04/17 0237) ? ?Hemodynamic parameters for last 24 hours: ?  ? ?Intake/Output from previous day: ?04/16 0701 - 04/17 0700 ?In: 720 [P.O.:720] ?Out: 625 [Urine:525; Chest Tube:100] ?Intake/Output this shift: ?Total I/O ?In: -  ?Out: 650 [Urine:650] ? ?General appearance: alert, cooperative, and no distress ?Heart: regular rate and rhythm ?Lungs: mildly dim in right lower fields ? ?Lab Results: ?Recent Labs  ?  01/30/22 ?0431 01/31/22 ?0215  ?WBC 11.1* 9.1  ?HGB 9.2* 8.8*  ?HCT 28.2* 26.8*  ?PLT 746* 745*  ? ?BMET:  ?Recent Labs  ?  01/30/22 ?0431 01/31/22 ?0215  ?NA 136 136  ?K 4.2 4.5  ?CL 101 103  ?CO2 26 23  ?GLUCOSE 122* 107*  ?BUN 9 9  ?CREATININE 1.00 1.09  ?CALCIUM 8.8* 9.0  ?  ?PT/INR: No results for input(s): LABPROT, INR in the last 72 hours. ?ABG ?   ?Component Value Date/Time  ? O2SAT 67.9 01/20/2022 0321  ? ?CBG (last 3)  ?Recent Labs  ?  01/29/22 ?2107  ?GLUCAP 156*  ? ? ?Meds ?Scheduled Meds: ? amoxicillin-clavulanate  1 tablet Oral Q12H  ? apixaban  5 mg Oral BID  ? atorvastatin  10 mg Oral Daily  ? carvedilol  3.125 mg Oral BID WC  ? dapagliflozin propanediol  10 mg Oral Daily  ? digoxin  0.125 mg Oral Daily  ? ferrous sulfate  325 mg Oral BID WC  ? furosemide  40 mg Oral Daily  ? losartan  12.5 mg Oral Daily  ? nicotine  21 mg Transdermal Daily  ? pantoprazole  40 mg Oral Daily  ? sodium chloride flush  10 mL Other Q8H  ? spironolactone  25 mg Oral Daily  ? ?Continuous Infusions: ?PRN  Meds:.acetaminophen **OR** acetaminophen, albuterol, HYDROmorphone (DILAUDID) injection, HYDROmorphone, naLOXone (NARCAN)  injection ? ?Xrays ?DG CHEST PORT 1 VIEW ? ?Result Date: 01/30/2022 ?CLINICAL DATA:  Shortness of breath. EXAM: PORTABLE CHEST 1 VIEW COMPARISON:  01/29/2022 FINDINGS: Stable position of right basilar chest tube. Unchanged scratch set cardiac enlargement is stable. Moderate right pleural effusion and small left pleural effusion are unchanged in the interval. No change in aeration to the right lung. Improved aeration to the left lung base. IMPRESSION: 1. Stable position of right chest tube without pneumothorax. 2. Bilateral pleural effusions are unchanged from previous exam. Electronically Signed   By: Signa Kell M.D.   On: 01/30/2022 10:13  ? ?DG CHEST PORT 1 VIEW ? ?Result Date: 01/29/2022 ?CLINICAL DATA:  Follow-up.  Right chest tube. EXAM: PORTABLE CHEST 1 VIEW COMPARISON:  01/28/2022 and prior exams. CT, 01/29/2022 at 3:47 a.m. FINDINGS: Right inferior hemithorax chest tube. Mild interval decrease in opacity at the right lung base, with a portion of the right hemidiaphragm now visible. Smaller area of airspace opacity in the left peripheral mid to lower lung. Mild opacity at the left lung base. Left lung  appearance is stable. No pneumothorax. IMPRESSION: 1. Position of the right inferior hemithorax chest tube appears changed from the most recent prior exam, and there has been an interval decrease in the amount of opacity at the right lung base, likely due to decreased pleural fluid. Persistent right mid to lower lung zone opacities are consistent with multifocal pneumonia. 2. Stable airspace opacities at the left lung base likely a combination infection and dependent atelectasis. Electronically Signed   By: Amie Portland M.D.   On: 01/29/2022 16:52   ? ?Assessment/Plan: ? ?1 afeb, VSS ?2 sats good on RA ?3 CXR stable in appearance, no more thrombolytics, can have CT removed- was placed by  IR. CT drainage- 100 cc, no air leak ?4 medical management per primary ? ? ? LOS: 3 days  ? ?Feels better with tube out ?CXR stable ?Repeat CXR in AM ?Needs to ambulate ? ?Salvatore Decent Dorris Fetch, MD ?[facility] ?([phone] ? ? ?Glenice Laine GoldPA-C ?Pager [phone] ?01/31/2022 ?  ?

## 2022-01-31 NOTE — Progress Notes (Signed)
Patient offered to walk in halls as MD ordered, said he didn't feel like it right now. ? ? ?Kenta Laster, Kae Heller, RN ? ?

## 2022-01-31 NOTE — Progress Notes (Addendum)
?PROGRESS NOTE ? ? 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 ?On admit ?Data = BUN/creatinine 8/0.7, WBC 11 platelet 784 hemoglobin 10.4 up from 8-9 range ?CT chest 4/15 = stable cardiomegaly pulmonary hypertension small right pleural effusion persisting partially loculated right major fissure medial pleural space with loculated gas around catheter-improving multifocal consolidation + evolving pulmonary infarcts?  Multifocal pneumonia ? ? ?Recently underwent alcohol detox in [place] ?4/3: Presented to [facility] + MOD S2/2 septic + cardiogenic shock-ICU --milrinone by cardiology and plans for left right heart cath noted per Dr. Serita Kyle note ?4/4: left-sided pleural effusion drained 850 cc culture negative malignancy negative ?4/5: Pulmonary emboli noted ?4/7: Cardiac cath EF 15% with 40% LAD lesion--felt to be nonischemic cardiomyopathy-RHC = mild pulmonary HTN normal CO ?4/10 CXR right-sided infiltrates parapneumonic effusion ?4/11: US guided thoracentesis 200 cc + severe loculations ?4/13 CT-guided pleural drain placement  ?4/14: Pulmonology recommending transfer to [facility] for CVTS input ?4/15: CVTS consulted recommending nonoperative management but TKnase ?4/17: Chest x-ray has cleared chest tube has been removed ?\ ?Hospital-Problem based course ? ?Multifocal consolidation + loculated pleural effusion left side which is exudative ?Seen by CVTS 4/15 nonoperable but interventional  candidate ?Chest x-ray clear so IR removed her chest tube ? Augmentin - 3 weeks (total) ending 02/06/2022 ?Continue to downward adjust pain meds-he is aware that he will go home on a very limited amount of opiates ?Cardiac cath EF 15 to 20%-presumed alcoholic cardiomyopathy ?HFrEF acute this admission now resolved ? 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 iron only ? ?DVT prophylaxis: Eliquis ?Code Status: Full ?Family Communication: None ?Disposition:  ?Status is: Inpatient ?Remains inpatient appropriate because:  ? ?Not ready for discharge has chest tube ?  ?Consultants:  ?CVTS ? ?Procedures:  ? ?Antimicrobials: Augmentin 4/23. ? ?Subjective: ? ?Awake alert coherent pain is fair chest tube was just pulled  ?no cardiac like chest pain, only pain from G-tube site ?No fever ? ?Objective: ?Vitals:  ? 01/31/22 0237 01/31/22 0513 01/31/22 0741 01/31/22 1041  ?BP:  (!) 128/93 132/86 124/81  ?Pulse:  87 87 91  ?Resp:  17 20 20   ?Temp:  98.5 ?F (36.9 ?C) 98.4 ?F (36.9 ?C) 98 ?F (36.7 ?C)  ?TempSrc:  Oral Oral Oral  ?SpO2:  96% 99% 94%  ?Weight: 88.1 kg     ? ? ?Intake/Output Summary (Last 24 hours) at 01/31/2022 1200 ?Last data filed at 01/31/2022 0855 ?  Gross per 24 hour  ?Intake 1194 ml  ?Output 1275 ml   ?Net -81 ml  ? ? ?Filed Weights  ? 01/30/22 0453 01/31/22 0237  ?Weight: 87.9 kg 88.1 kg  ? ? ?Examination: ? ?Coherent pleasant no distress EOMI NCAT no focal deficit ?Chest clear on the left side however the right side has dull percussion note ?ROM intact ?Abdomen soft no rebound or guarding ?Lower extremity edema ? ?Data Reviewed: personally reviewed  ? ?CBC ?   ?Component Value Date/Time  ? WBC 9.1 01/31/2022 0215  ? RBC 3.29 (L) 01/31/2022 0215  ? HGB 8.8 (L) 01/31/2022 0215  ? HCT 26.8 (L) 01/31/2022 0215  ? PLT 745 (H) 01/31/2022 0215  ? MCV 81.5 01/31/2022 0215  ? MCH 26.7 01/31/2022 0215  ? MCHC 32.8 01/31/2022 0215  ? RDW 18.5 (H) 01/31/2022 0215  ? LYMPHSABS 2.9 06/21/2021 0435  ? MONOABS 0.7 06/21/2021 0435  ? EOSABS 0.1 06/21/2021 0435  ? BASOSABS 0.1 06/21/2021 0435  ? ? ?  Latest Ref Rng & Units 01/31/2022  ?  2:15 AM 01/30/2022  ?  4:31 AM 01/29/2022  ?  4:06 AM  ?CMP  ?Glucose 70 - 99 mg/dL 191   478   295    ?BUN 6 - 20 mg/dL 9   9   8     ?Creatinine 0.61 - 1.24 mg/dL 6.21   3.08   6.57    ?Sodium 135 - 145 mmol/L 136   136   137    ?Potassium 3.5 - 5.1 mmol/L 4.5   4.2   4.3    ?Chloride 98 - 111 mmol/L 103   101   102    ?CO2 22 - 32 mmol/L 23   26   27     ?Calcium 8.9 - 10.3 mg/dL 9.0   8.8   9.2    ?Total Protein 6.5 - 8.1 g/dL 6.5   6.5     ?Total Bilirubin 0.3 - 1.2 mg/dL 0.4   0.5     ?Alkaline Phos 38 - 126 U/L 141   157     ?AST 15 - 41 U/L 20   22     ?ALT 0 - 44 U/L 17   21     ? ? ? ?Radiology Studies: ?DG Chest 1 View ? ?Result Date: 01/31/2022 ?CLINICAL DATA:  Status post pigtail chest tube removal. EXAM: CHEST  1 VIEW COMPARISON:  Chest x-ray from earlier same day. FINDINGS: RIGHT basilar chest tube has been removed. No pneumothorax is seen. Stable patchy airspace opacities at the RIGHT lung base. Stable hazy opacity in the LEFT lower lung. Stable cardiomegaly. IMPRESSION: 1. No pneumothorax seen status post removal of the RIGHT-sided chest tube. 2. Stable patchy airspace opacities at  the RIGHT lung base, and stable hazy opacity in the LEFT lower lung, most likely bilateral pneumonia based on the earlier chest CT appearance. 3. Stable cardiomegaly. Electronically Signed   By: Bary Richard M.D.   On: 01/31/2022 11:48  ? ?DG CHEST PORT 1 VIEW ? ?Result Date: 01/31/2022 ?CLINICAL DATA:  Right chest tube.  Evaluate for pneumothorax. EXAM: PORTABLE CHEST 1 VIEW COMPARISON:  01/30/2022 and chest CT 01/29/2022 FINDINGS: Stable position of the chest tube in the right lower chest. Persistent parenchymal densities in the mid and lower right lung with minimal change. Negative for a pneumothorax. Heart remains enlarged. Residual hazy densities in the left lower lung region. Left upper lung remains clear. IMPRESSION: 1. Stable position of  the right chest tube without pneumothorax. 2. No significant change in the parenchymal lung densities in the mid and lower right lung. Residual hazy densities in the left lower lung. Findings are suggestive for airspace disease/consolidation based on the previous chest CT. 3. Stable cardiomegaly. Electronically Signed   By: Richarda Overlie M.D.   On: 01/31/2022 08:14  ? ?DG CHEST PORT 1 VIEW ? ?Result Date: 01/30/2022 ?CLINICAL DATA:  Shortness of breath. EXAM: PORTABLE CHEST 1 VIEW COMPARISON:  01/29/2022 FINDINGS: Stable position of right basilar chest tube. Unchanged scratch set cardiac enlargement is stable. Moderate right pleural effusion and small left pleural effusion are unchanged in the interval. No change in aeration to the right lung. Improved aeration to the left lung base. IMPRESSION: 1. Stable position of right chest tube without pneumothorax. 2. Bilateral pleural effusions are unchanged from previous exam. Electronically Signed   By: Signa Kell M.D.   On: 01/30/2022 10:13  ? ?DG CHEST PORT 1 VIEW ? ?Result Date: 01/29/2022 ?CLINICAL DATA:  Follow-up.  Right chest tube. EXAM: PORTABLE CHEST 1 VIEW COMPARISON:  01/28/2022 and prior exams. CT, 01/29/2022 at 3:47  a.m. FINDINGS: Right inferior hemithorax chest tube. Mild interval decrease in opacity at the right lung base, with a portion of the right hemidiaphragm now visible. Smaller area of airspace opacity in t

## 2022-01-31 NOTE — TOC Progression Note (Signed)
Transition of Care (TOC) - Progression Note  ? ? ?Patient Details  ?Name: Mario Beck ?MRN: [id number] ?Date of Birth: 05/27/1981 ? ?Transition of Care (TOC) CM/SW Contact  ?Leone Haven, RN ?Phone Number: ?01/31/2022, 11:32 PM ? ?Clinical Narrative:    ? from home, chest tube pulled today, if xray is good will plan for dc tomorrow.   TOC will continue to follow for dc needs.  ? ? ?  ?  ? ?Expected Discharge Plan and Services ?  ?  ?  ?  ?  ?                ?  ?  ?  ?  ?  ?  ?  ?  ?  ?  ? ? ?Social Determinants of Health (SDOH) Interventions ?  ? ?Readmission Risk Interventions ? ?  01/19/2022  ?  4:03 PM 06/19/2021  ?  9:48 AM  ?Readmission Risk Prevention Plan  ?Transportation Screening Complete Complete  ?PCP or Specialist Appt within 5-7 Days Complete Complete  ?Home Care Screening Complete Complete  ?Medication Review (RN CM) Complete Complete  ? ? ?

## 2022-01-31 NOTE — Progress Notes (Signed)
Offered to walk patient in hallway and also tech has offered patient wishes to wait til after lunch. ? ?Jeryl Umholtz, Kae Heller, RN  ?

## 2022-01-31 NOTE — Plan of Care (Signed)
?  Problem: Education: ?Goal: Knowledge of General Education information will improve ?Description: Including pain rating scale, medication(s)/side effects and non-pharmacologic comfort measures ?Outcome: Progressing ?  ?Problem: Clinical Measurements: ?Goal: Ability to maintain clinical measurements within normal limits will improve ?Outcome: Progressing ?  ?Problem: Clinical Measurements: ?Goal: Respiratory complications will improve ?Outcome: Progressing ?  ?Problem: Activity: ?Goal: Risk for activity intolerance will decrease ?Outcome: Progressing ?  ?Problem: Nutrition: ?Goal: Adequate nutrition will be maintained ?Outcome: Progressing ?  ?Problem: Coping: ?Goal: Level of anxiety will decrease ?Outcome: Progressing ?  ?Problem: Pain Managment: ?Goal: General experience of comfort will improve ?Outcome: Progressing ?  ?Problem: Safety: ?Goal: Ability to remain free from injury will improve ?Outcome: Progressing ?  ?

## 2022-01-31 NOTE — Plan of Care (Signed)
  Problem: Education: Goal: Knowledge of General Education information will improve Description Including pain rating scale, medication(s)/side effects and non-pharmacologic comfort measures Outcome: Progressing   Problem: Health Behavior/Discharge Planning: Goal: Ability to manage health-related needs will improve Outcome: Progressing   

## 2022-01-31 NOTE — Progress Notes (Signed)
S/p (R)pigtail chest drain placed on 4/13 ?Managed by CTS, lytics given with good output. ?Now output scant and CXR stable. ?Ready for tube removal. ?BP 132/86   Pulse 87   Temp 98.4 ?F (36.9 ?C) (Oral)   Resp 20   Wt 88.1 kg   SpO2 99%   BMI 24.27 kg/m?  ?(R)chest drain intact, site clean. ? ?Tube removed without difficulty. ?Pt tolerated well. ?Vaseline/Occlusive dressing applied, leave 24 hr. ?Post removal CXR ordered. ? ? ?Brayton El PA-C ?Interventional Radiology ?01/31/2022 ?9:18 AM ? ?

## 2022-02-01 ENCOUNTER — Other Ambulatory Visit: Payer: Self-pay

## 2022-02-01 ENCOUNTER — Encounter (HOSPITAL_COMMUNITY): Payer: Self-pay | Admitting: Internal Medicine

## 2022-02-01 ENCOUNTER — Inpatient Hospital Stay (HOSPITAL_COMMUNITY): Payer: Medicaid Other

## 2022-02-01 MED ORDER — FERROUS SULFATE 325 (65 FE) MG PO TABS
325.0000 mg | ORAL_TABLET | Freq: Two times a day (BID) | ORAL | 3 refills | Status: AC
  Filled 2022-02-01: qty 60, 30d supply, fill #0
  Filled 2022-02-24: qty 60, 30d supply, fill #1

## 2022-02-01 MED ORDER — APIXABAN 5 MG PO TABS
5.0000 mg | ORAL_TABLET | Freq: Two times a day (BID) | ORAL | 11 refills | Status: AC
  Filled 2022-02-01: qty 60, 30d supply, fill #0
  Filled 2022-02-24: qty 60, 30d supply, fill #1

## 2022-02-01 MED ORDER — PANTOPRAZOLE SODIUM 40 MG PO TBEC
40.0000 mg | DELAYED_RELEASE_TABLET | Freq: Every day | ORAL | 0 refills | Status: DC
  Filled 2022-02-01: qty 30, 30d supply, fill #0

## 2022-02-01 MED ORDER — AMOXICILLIN-POT CLAVULANATE 875-125 MG PO TABS
1.0000 | ORAL_TABLET | Freq: Two times a day (BID) | ORAL | 0 refills | Status: AC
  Filled 2022-02-01: qty 14, 7d supply, fill #0

## 2022-02-01 NOTE — Progress Notes (Signed)
Patient informed to wait in room until seen by case manager. Per Diplomatic Services operational officer, patient stated his ride is waiting for him and he is leaving.  ?

## 2022-02-01 NOTE — Progress Notes (Addendum)
? ?   ?  [address] ?      [place] [address] ?            [phone]   ? ?  ? ?   ? ?Subjective: ?Patient sleeping this am;briefly awakened. When asked if his breathing is good he nodded yest ? ?Objective: ?Vital signs in last 24 hours: ?Temp:  [98 ?F (36.7 ?C)-98.4 ?F (36.9 ?C)] 98 ?F (36.7 ?C) (04/18 7026) ?Pulse Rate:  [79-91] 79 (04/18 0553) ?Cardiac Rhythm: Normal sinus rhythm (04/17 2031) ?Resp:  [18-20] 18 (04/18 0553) ?BP: (115-132)/(70-91) 131/91 (04/18 0553) ?SpO2:  [92 %-99 %] 98 % (04/18 0553) ?Weight:  [85 kg] 85 kg (04/18 0553) ? ?  ? ?Intake/Output from previous day: ?04/17 0701 - 04/18 0700 ?In: 954 [P.O.:954] ?Out: 650 [Urine:650] ? ? ?Physical Exam: ? ?Cardiovascular: RRR ?Pulmonary: Clear to auscultation on left and slightly diminished right base ?Extremities:No lower extremity edema. ? ? ?Lab Results: ?CBC: ?Recent Labs  ?  01/30/22 ?0431 01/31/22 ?0215  ?WBC 11.1* 9.1  ?HGB 9.2* 8.8*  ?HCT 28.2* 26.8*  ?PLT 746* 745*  ? ? ?BMET:  ?Recent Labs  ?  01/30/22 ?0431 01/31/22 ?0215  ?NA 136 136  ?K 4.2 4.5  ?CL 101 103  ?CO2 26 23  ?GLUCOSE 122* 107*  ?BUN 9 9  ?CREATININE 1.00 1.09  ?CALCIUM 8.8* 9.0  ? ?  ?PT/INR: No results for input(s): LABPROT, INR in the last 72 hours. ?ABG:  ?INR: ?Will add last result for INR, ABG once components are confirmed ?Will add last 4 CBG results once components are confirmed ? ?Assessment/Plan: ? ?1. CV - SR. On Coreg 3.125 mg bid and Losartan 12.5 mg daily ?2.  Pulmonary - History of COPD. He has a complex right pleural effusion. On room air. Chest tube removed yesterday. I ordered PA/LAT CXR for this am. ?3. Anemia-H and H this am 9.2 and 28.2. Per primary ?4. History of right PE-on Apixaban 5 mg bid ?5. 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 ID-on Augmentin  ? ?Lelon Huh ZimmermanPA-C ?02/01/2022,7:24  AM ?[phone]  ? ?Looks good. ?CXR shows a small amount of residual pleural fluid/ thickening, some fluid loculated in fissure. Overall a very ggo result comparable to what we might have gotten with surgery ?OK to dc from our standpoint once medically stable. ? ?Salvatore Decent. Dorris Fetch, MD ?[facility] ?[phone] ? ? ?

## 2022-02-01 NOTE — Discharge Summary (Signed)
Physician Discharge Summary  ?Mario Beck [id number] DOB: [birth year] DOA: 01/28/2022 ? ?PCP: Pcp, No ? ?Admit date: 01/28/2022 ?Discharge date: 02/01/2022 ? ?Time spent: 37 minutes ? ?Recommendations for Outpatient Follow-up:  ?Follow-up at the free clinic/[facility] clinic in [facility] ?Recommend outpatient chest x-ray in about 1 to 2 months-complete all of the antibiotics that we have prescribed ?You will need to quit eventually smoking as this will help heal your lungs-please ensure that you get the resources you need ?He will probably need an echocardiogram in about a month's time and a TOC follow-up with cardiology because of his nonischemic cardiomyopathy ? ?Discharge Diagnoses:  ?MAIN problem for hospitalization  ? 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 ?Please see below for itemized issues addressed in HOpsital- ?refer to other progress notes for clarity if needed ? ?Discharge Condition: Improved ? ?Diet recommendation: Low-salt ? ?Filed Weights  ? 01/30/22 0453 01/31/22 0237 02/01/22 0553  ?Weight: 87.9 kg 88.1 kg 85 kg  ? ? 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  ?On admit ?Data = BUN/creatinine 8/0.7, WBC 11 platelet 784 hemoglobin 10.4 up from 8-9 range ?CT chest 4/15 = stable cardiomegaly pulmonary hypertension small right pleural effusion persisting partially loculated right major fissure medial pleural space with loculated gas around catheter-improving multifocal consolidation +  evolving pulmonary infarcts?  Multifocal pneumonia ?  ?  ?Recently underwent alcohol detox in [place] ?4/3: Presented to [facility] + MOD S2/2 septic + cardiogenic shock-ICU --milrinone by cardiology and plans for left right heart cath noted per Dr. Darnelle Bos note ?4/4: left-sided pleural effusion drained 850 cc culture negative malignancy negative ?4/5: Pulmonary emboli noted ?4/7: Cardiac cath EF 15% with 40% LAD lesion--felt to be nonischemic cardiomyopathy-RHC = mild pulmonary HTN normal CO ?4/10 CXR right-sided infiltrates parapneumonic effusion ?4/11: US guided thoracentesis 200 cc + severe loculations ?4/13 CT-guided pleural drain placement  ?4/14: Pulmonology recommending transfer to [facility] input ?4/15: CVTS consulted recommending nonoperative management but TKnase ?4/17: Chest x-ray has cleared chest tube has been removed ? ?Hospital Course:  ?Hospital-Problem based course ?  ?Multifocal consolidation + loculated pleural effusion left side which is exudative ?Seen by CVTS 4/15 nonoperable but interventional candidate ?Chest x-ray clear so IR removed her chest tube ? Augmentin - 3 weeks (total) ending 02/08/2022 ?Patient will use Tylenol and Naprosyn for pain control on discharge he understands no pain meds will be prescribed ?Cardiac cath EF 15 to 99991111 alcoholic cardiomyopathy ?HFrEF acute this admission now resolved ? 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 iron only ? ?Procedures: ? ?ECHO ? ? ? 1. Left ventricular ejection fraction, by estimation, is 20 to 25%. The  ?left ventricle has severely decreased function. The left ventricle  ?demonstrates global hypokinesis. The left ventricular internal cavity size  ?was severely dilated. Indeterminate  ?diastolic filling due to E-A fusion.  ? 2. Right ventricular systolic function is moderately reduced. The right  ?ventricular size is mildly enlarged.  ? 3. Left atrial size was mildly dilated.  ? 4. Right atrial size was mildly dilated.  ? 5. Moderate pleural effusion in the left lateral region.  ? 6. The mitral valve is normal in structure. Mild mitral valve  ?regurgitation.  ? 7. Tricuspid valve regurgitation is moderate.  ? 8. The aortic valve is tricuspid. Aortic valve regurgitation is not  ?visualized. No aortic stenosis is present.  ? ?Consultations: ?CVTS ?Cardiology ? ?Discharge Exam: ?Vitals:  ? 02/01/22 0553 02/01/22 0750  ?BP: (!) 131/91 127/82  ?Pulse: 79 92  ?Resp: 18 18  ?Temp: 98 ?F (36.7 ?C) 97.6 ?F (36.4 ?C)  ?SpO2: 98% 96%  ? ? ?Subj on day of d/c ?  ?Well no distress eating drinking no cp no fever ? ?General Exam on discharge ? ?Eomi ncat  ?Cta b decreased Post lat in the R post lung fields ?S1 s2 no m/r/g ?No le edema ?Neuro intact ? ?Discharge Instructions ? ? ?Discharge Instructions   ? ? Diet - low sodium heart healthy   Complete by: As directed ?  ? Discharge instructions   Complete by: As directed ?  ? Please make sure that you complete all of the antibiotics for the chest infection that you have ?As mentioned previously we will not be discharging you on any pain meds-you will be able to take over-the-counter Tylenol 500 every 4 hours for first choice for pain and/or over-the-counter Naprosyn 375 twice a day  for pain ?Make sure that you follow-up at the free clinic [facility] [place] we have called in your meds there ?Make sure you get an x-ray in about a month ? ?As mentioned to you you should stop drinking completely--- when you are ready to quit to follow-up with the [facility] clinic at [facility] and asked them about Chantix and Wellbutrin this will probably help you quit smoking when you are ready ? ?Take care of yourself good luck and remember to get help if you think you need more help with cessation of alcohol  ? Increase activity slowly   Complete by: As directed ?  ? No wound care   Complete by: As directed ?  ? ?  ? ?Allergies as of 02/01/2022   ? ?   Reactions  ? Lisinopril Swelling  ? Angioedema  ? Shellfish Allergy Swelling  ? ?  ? ?  ?Medication List  ?  ? ?TAKE these medications   ? ?amoxicillin-clavulanate 875-125 MG tablet ?Commonly known as: AUGMENTIN ?Take 1 tablet by mouth every 12 (twelve) hours for 7 days. ?  ?apixaban 5 MG Tabs tablet ?Commonly known as: ELIQUIS ?Take 2 tablets (10 mg total) by mouth 2 (two) times daily. Starting on 01/30/22-- take 1 tablet (5 mg) twice daily. ?  ?atorvastatin 10 MG tablet ?Commonly known as: LIPITOR ?Take 1 tablet (10 mg total) by mouth once daily. ?  ?carvedilol 3.125 MG tablet ?Commonly known as: COREG ?Take 1 tablet (3.125 mg total) by mouth 2 (two) times daily with  a meal. ?  ?dapagliflozin propanediol 10 MG Tabs tablet ?Commonly known as: FARXIGA ?Take 1 tablet (10 mg total) by mouth once daily. ?  ?digoxin 0.125 MG tablet ?Commonly known as: LANOXIN ?Take 1 tablet (0.125 mg total) by mouth once daily. ?  ?ferrous sulfate 325 (65 FE) MG tablet ?Take 1 tablet (325 mg total) by mouth 2 (two) times daily with a meal. ?  ?folic acid 1 MG tablet ?Commonly known as: FOLVITE ?Take 1 tablet (1 mg total) by mouth once daily. ?  ?furosemide 40 MG tablet ?Commonly known as: Lasix ?Take 1 tablet (40 mg total) by mouth once daily. ?  ?losartan 25 MG tablet ?Commonly  known as: COZAAR ?Take 1/2 tablet (12.5 mg total) by mouth once daily. ?  ?multivitamin with minerals Tabs tablet ?Take 1 tablet by mouth daily. ?  ?nicotine 21 mg/24hr patch ?Commonly known as: NICODERM CQ - dosed

## 2022-02-01 NOTE — TOC Transition Note (Addendum)
Transition of Care (TOC) - CM/SW Discharge Note ? ? ?Patient Details  ?Name: RAINEN VANROSSUM ?MRN: [id number] ?Date of Birth: 1981/10/07 ? ?Transition of Care (TOC) CM/SW Contact:  ?Leone Haven, RN ?Phone Number: ?02/01/2022, 9:45 AM ? ? ?Clinical Narrative:    ?Got DC, he did not want to wait for NCM  to get out of progression , he left. Nurse states he can get his medications. Patient told Nurse he was going to pick up his meds at [facility] clinic.   ? ? ?  ?  ? ? ?Patient Goals and CMS Choice ?  ?  ?  ? ?Discharge Placement ?  ?           ?  ?  ?  ?  ? ?Discharge Plan and Services ?  ?  ?           ?  ?  ?  ?  ?  ?  ?  ?  ?  ?  ? ?Social Determinants of Health (SDOH) Interventions ?  ? ? ?Readmission Risk Interventions ? ?  01/19/2022  ?  4:03 PM 06/19/2021  ?  9:48 AM  ?Readmission Risk Prevention Plan  ?Transportation Screening Complete Complete  ?PCP or Specialist Appt within 5-7 Days Complete Complete  ?Home Care Screening Complete Complete  ?Medication Review (RN CM) Complete Complete  ? ? ? ? ? ?

## 2022-02-01 NOTE — Progress Notes (Signed)
@   2130: Pt educated on importance of ambulation for respiratory health and narcotic side effects of respiratory depression. Pt eager to discharge home and agreeable to walk in hall full length of unit from one end to the other. Pt in no respiratory distress, denied chest pain. Pt tolerated walk very well. WCTM. ?

## 2022-02-02 ENCOUNTER — Other Ambulatory Visit: Payer: Self-pay

## 2022-02-03 ENCOUNTER — Other Ambulatory Visit: Payer: Self-pay

## 2022-02-03 MED ORDER — FUROSEMIDE 40 MG PO TABS
40.0000 mg | ORAL_TABLET | Freq: Every day | ORAL | 2 refills | Status: DC
  Filled 2022-02-03: qty 30, 30d supply, fill #0

## 2022-02-04 ENCOUNTER — Telehealth: Payer: Self-pay

## 2022-02-04 NOTE — Telephone Encounter (Signed)
Post hospital discharge phone call: ? ?Called and spoke with patient.  He was currently in the car and did not have his medications with him.  He states that he still has some to pick up from the medical management clinic.  He states that he is taking his Lasix, the blood thinner, iron and the antibiotic. ? ?He states that he is feeling great, he has been able to to get out and walk, he is walking as of last night 3 laps around the track.  He states that he had to stop twice during that time to catch his breath.  Minded to take it easy and not to push and to listen to his body when he is tired to sit down and rest.  He voices understanding. ? ?He is sticking with the fluid restriction and eating low-sodium. ? ?Denied any PND, orthopnea, increased swelling or increasing shortness of breath. ? ?Discussed his appointment in the outpatient heart failure clinic for which she has an appointment this Wednesday, 26 April.  He voices understanding.  Reminded that they would give him a call 1 or 2 days prior to his appointment and that he could ask for the address at that time.  He did not have anything that he could write the address down at this time. ? ?He had no further questions. ? ?Tresa Endo RN [facility] ? ? ?

## 2022-02-08 NOTE — Progress Notes (Signed)
? Patient ID: Mario Beck, male    DOB: 27-Aug-1981, 41 y.o.   MRN: [id number] ? ?HPI ? ?Mario Beck is a 41 y/o male with a history of asthma, HTN, alcohol use, PE, depression, tobacco use and chronic heart failure.  ? ?Echo report from 01/18/22 reviewed and showed an EF 20-25% along with mild Mario and moderate TR.  ? ?RHC/LHC done 01/21/22 showed: ?Mid LAD lesion is 40% stenosed. ?  There is severe left ventricular systolic dysfunction. ?  LV end diastolic pressure is mildly elevated. ?  The left ventricular ejection fraction is less than 25% by visual estimate.  ?1.  Moderate one-vessel coronary artery disease with 40% stenosis in the mid LAD.  No evidence of obstructive disease. ?2.  Severely reduced LV systolic function with an EF of 15%. ?3.  Right heart catheterization showed mildly elevated filling pressures, mild pulmonary hypertension and normal cardiac output. ? ?Admitted 01/28/22 after being transferred from OSH for cardiothoracic surgical consult. Right-sided pleural drain intact. Chest tube eventually removed. Medications adjusted. Discharged after 4 days. Admitted 01/17/22 due to dyspnea, fatigue and weight gain. Had passed out twice prior to admission. Found to have acute PE on the right with probable pulmonary infarct. Started on heparin drip, then transitioned to Eliquis. Cardiology, pulmonology and palliative care consults obtained. Underwent ultrasound-guided thoracentesis with 200 mL of blood-tinged fluid, exudate. s/p CT guided right pleural drain placement. Transferred after 11 days.  ? ?He presents today for his initial visit with a chief complaint of minimal shortness of breath with moderate exertion. Describes this as chronic in nature but improving from recent admissions. Has associated fatigue along with this. Denies any chronic difficulty sleeping, dizziness, abdominal distention, palpitations, pedal edema, chest pain, wheezing or cough.  ? ?Has not been weighing daily but does have scales. Says  that he weighs a few times/ week. Had been taking potassium in the past but would need a new RX if it needs to be continued.  ? ?Past Medical History:  ?Diagnosis Date  ? Alcohol abuse   ? Asthma   ? CHF (congestive heart failure) (HCC)   ? Depression   ? Hypertension   ? Pulmonary embolus (HCC)   ? ?Past Surgical History:  ?Procedure Laterality Date  ? FRACTURE SURGERY    ? Left leg as a kid  ? RIGHT/LEFT HEART CATH AND CORONARY ANGIOGRAPHY N/A 01/21/2022  ? Procedure: RIGHT/LEFT HEART CATH AND CORONARY ANGIOGRAPHY;  Surgeon: Iran Ouch, MD;  Location: [facility] INVASIVE CV LAB;  Service: Cardiovascular;  Laterality: N/A;  ? ?Family History  ?Problem Relation Age of Onset  ? Colon cancer Neg Hx   ? Esophageal cancer Neg Hx   ? ?Social History  ? ?Tobacco Use  ? Smoking status: Every Day  ?  Packs/day: 1.00  ?  Types: Cigarettes  ? Smokeless tobacco: Current  ?  Types: Snuff  ? Tobacco comments:  ?  Does pouches as welll   ?Substance Use Topics  ? Alcohol use: Not Currently  ?  Alcohol/week: 7.0 standard drinks  ?  Types: 7 Cans of beer per week  ? ?Allergies  ?Allergen Reactions  ? Lisinopril Swelling  ?  Angioedema ?  ? Shellfish Allergy Swelling  ? ?Prior to Admission medications   ?Medication Sig Start Date End Date Taking? Authorizing Provider  ?apixaban (ELIQUIS) 5 MG TABS tablet Take 1 tablet (5 mg total) by mouth 2 (two) times daily. 02/01/22  Yes Rhetta Mura, MD  ?atorvastatin (LIPITOR)  10 MG tablet Take 1 tablet (10 mg total) by mouth once daily. 01/29/22  Yes [name], MD  ?carvedilol (COREG) 3.125 MG tablet Take 1 tablet (3.125 mg total) by mouth 2 (two) times daily with a meal. 01/28/22  Yes [name], MD  ?dapagliflozin propanediol (FARXIGA) 10 MG TABS tablet Take 1 tablet (10 mg total) by mouth once daily. 01/29/22  Yes [name], MD  ?digoxin (LANOXIN) 0.125 MG tablet Take 1 tablet (0.125 mg total) by mouth once daily. 01/29/22  Yes [name], MD  ?ferrous sulfate 325 (65 FE) MG tablet  Take 1 tablet (325 mg total) by mouth 2 (two) times daily with a meal. 02/01/22  Yes Rhetta Mura, MD  ?folic acid (FOLVITE) 1 MG tablet Take 1 tablet (1 mg total) by mouth once daily. 01/29/22  Yes [name], MD  ?losartan (COZAAR) 25 MG tablet Take 1/2 tablet (12.5 mg total) by mouth once daily. 01/29/22  Yes [name], MD  ?pantoprazole (PROTONIX) 40 MG tablet Take 1 tablet (40 mg total) by mouth once daily. 02/01/22 03/03/22 Yes Rhetta Mura, MD  ?senna-docusate (SENOKOT-S) 8.6-50 MG tablet Take 1 tablet by mouth 2 (two) times daily. 01/28/22  Yes [name], MD  ?spironolactone (ALDACTONE) 25 MG tablet Take 1 tablet (25 mg total) by mouth once daily. 01/29/22  Yes [name], MD  ?furosemide (LASIX) 40 MG tablet Take 1 tablet (40 mg total) by mouth once daily. 12/08/21 03/08/22  Gilles Chiquito, MD  ?oxyCODONE (OXY IR/ROXICODONE) 5 MG immediate release tablet Take 1 tablet (5 mg total) by mouth every 6 (six) hours as needed for severe pain or moderate pain. ?Patient not taking: Reported on 02/09/2022 01/28/22   [name], MD  ? ?Review of Systems  ?Constitutional:  Positive for fatigue. Negative for appetite change.  ?HENT:  Negative for congestion, rhinorrhea and sore throat.   ?Eyes: Negative.   ?Respiratory:  Positive for shortness of breath. Negative for cough and wheezing.   ?Cardiovascular:  Negative for chest pain, palpitations and leg swelling.  ?Gastrointestinal:  Negative for abdominal distention and abdominal pain.  ?Endocrine: Negative.   ?Genitourinary: Negative.   ?Musculoskeletal:  Negative for back pain and neck pain.  ?Skin: Negative.   ?Allergic/Immunologic: Negative.   ?Neurological:  Negative for dizziness and light-headedness.  ?Hematological:  Negative for adenopathy. Does not bruise/bleed easily.  ?Psychiatric/Behavioral:  Negative for dysphoric mood and sleep disturbance (sleeping on 1 pillow). The patient is not nervous/anxious.   ? ?Vitals:  ? 02/09/22 0951  ?BP: 118/77   ?Pulse: 99  ?Resp: 20  ?SpO2: 100%  ?Weight: 191 lb 2 oz (86.7 kg)  ?Height: 6\' 3"  (1.905 m)  ? ?Wt Readings from Last 3 Encounters:  ?02/09/22 191 lb 2 oz (86.7 kg)  ?02/01/22 187 lb 6.3 oz (85 kg)  ?01/28/22 199 lb 15.3 oz (90.7 kg)  ? ?Lab Results  ?Component Value Date  ? CREATININE 1.09 01/31/2022  ? CREATININE 1.00 01/30/2022  ? CREATININE 0.78 01/29/2022  ? ?Physical Exam ?Vitals and nursing note reviewed.  ?Constitutional:   ?   Appearance: Normal appearance.  ?HENT:  ?   Head: Normocephalic and atraumatic.  ?Cardiovascular:  ?   Rate and Rhythm: Normal rate and regular rhythm.  ?Pulmonary:  ?   Effort: Pulmonary effort is normal. No respiratory distress.  ?   Breath sounds: No wheezing or rales.  ?Abdominal:  ?   General: There is no distension.  ?   Palpations: Abdomen is soft.  ?  Musculoskeletal:     ?   General: No tenderness.  ?   Cervical back: Normal range of motion and neck supple.  ?   Right lower leg: No edema.  ?   Left lower leg: No edema.  ?Skin: ?   General: Skin is warm and dry.  ?Neurological:  ?   General: No focal deficit present.  ?   Mental Status: He is alert and oriented to person, place, and time.  ?Psychiatric:     ?   Mood and Affect: Mood normal.     ?   Behavior: Behavior normal.     ?   Thought Content: Thought content normal.  ? ?Assessment & Plan: ? ?1: Chronic heart failure with reduced ejection fraction- ?- NYHA class II ?- euvolemic today ?- not weighing daily but does have working scales; reviewed the importance of weighing daily and to call for an overnight weight gain of > 2 pounds or a weekly weight gain of > 5 pounds ?- trying to not add salt and read food labels for sodium content ?- will check BMP today; may not need potassium RX as previous level was 4.5 and he's on spironolactone ?- on GDMT of carvedilol, farxiga, losartan and spironolactone ?- angioedema with lisinopril so not a candidate for entresto ?- continues to smoke "some" but is working on this ?- BNP 01/17/22  was 2253.2 ?- PharmD reconciled medications with the patient ? ?2: HTN- ?- BP looks good today (118/77) ?- currently has no PCP as he's uninsured; is starting a new job tomorrow and isn't sure what or if

## 2022-02-09 ENCOUNTER — Other Ambulatory Visit
Admission: RE | Admit: 2022-02-09 | Discharge: 2022-02-09 | Disposition: A | Discharge status: Home / Self Care | Payer: Medicaid Other | Source: Ambulatory Visit | Attending: Family | Admitting: Family

## 2022-02-09 ENCOUNTER — Encounter: Payer: Self-pay | Admitting: Family

## 2022-02-09 ENCOUNTER — Other Ambulatory Visit: Payer: Self-pay | Admitting: Family

## 2022-02-09 ENCOUNTER — Telehealth: Payer: Self-pay | Admitting: Family

## 2022-02-09 ENCOUNTER — Ambulatory Visit: Payer: Medicaid Other | Attending: Family | Admitting: Family

## 2022-02-09 VITALS — BP 118/77 | HR 99 | Resp 20 | Ht 75.0 in | Wt 191.1 lb

## 2022-02-09 DIAGNOSIS — I272 Pulmonary hypertension, unspecified: Secondary | ICD-10-CM | POA: Diagnosis not present

## 2022-02-09 DIAGNOSIS — Z7901 Long term (current) use of anticoagulants: Secondary | ICD-10-CM | POA: Diagnosis not present

## 2022-02-09 DIAGNOSIS — F1090 Alcohol use, unspecified, uncomplicated: Secondary | ICD-10-CM | POA: Diagnosis not present

## 2022-02-09 DIAGNOSIS — I110 Hypertensive heart disease with heart failure: Secondary | ICD-10-CM | POA: Diagnosis not present

## 2022-02-09 DIAGNOSIS — Z7984 Long term (current) use of oral hypoglycemic drugs: Secondary | ICD-10-CM | POA: Diagnosis not present

## 2022-02-09 DIAGNOSIS — Z86711 Personal history of pulmonary embolism: Secondary | ICD-10-CM | POA: Diagnosis not present

## 2022-02-09 DIAGNOSIS — F1010 Alcohol abuse, uncomplicated: Secondary | ICD-10-CM | POA: Diagnosis not present

## 2022-02-09 DIAGNOSIS — I5022 Chronic systolic (congestive) heart failure: Secondary | ICD-10-CM | POA: Insufficient documentation

## 2022-02-09 DIAGNOSIS — R0602 Shortness of breath: Secondary | ICD-10-CM | POA: Diagnosis present

## 2022-02-09 DIAGNOSIS — I10 Essential (primary) hypertension: Secondary | ICD-10-CM | POA: Diagnosis not present

## 2022-02-09 DIAGNOSIS — Z79899 Other long term (current) drug therapy: Secondary | ICD-10-CM | POA: Diagnosis not present

## 2022-02-09 DIAGNOSIS — J45909 Unspecified asthma, uncomplicated: Secondary | ICD-10-CM | POA: Diagnosis not present

## 2022-02-09 DIAGNOSIS — I2510 Atherosclerotic heart disease of native coronary artery without angina pectoris: Secondary | ICD-10-CM | POA: Diagnosis not present

## 2022-02-09 DIAGNOSIS — F17210 Nicotine dependence, cigarettes, uncomplicated: Secondary | ICD-10-CM | POA: Insufficient documentation

## 2022-02-09 DIAGNOSIS — F32A Depression, unspecified: Secondary | ICD-10-CM | POA: Diagnosis not present

## 2022-02-09 DIAGNOSIS — F329 Major depressive disorder, single episode, unspecified: Secondary | ICD-10-CM | POA: Diagnosis not present

## 2022-02-09 LAB — BASIC METABOLIC PANEL
Anion gap: 12 (ref 5–15)
BUN: 19 mg/dL (ref 6–20)
CO2: 26 mmol/L (ref 22–32)
Calcium: 9.4 mg/dL (ref 8.9–10.3)
Chloride: 100 mmol/L (ref 98–111)
Creatinine, Ser: 0.95 mg/dL (ref 0.61–1.24)
GFR, Estimated: >60 mL/min (ref >60)
Glucose, Bld: 89 mg/dL (ref 70–99)
Potassium: 3.8 mmol/L (ref 3.5–5.1)
Sodium: 138 mmol/L (ref 135–145)

## 2022-02-09 NOTE — Patient Instructions (Signed)
Begin weighing daily and call for an overnight weight gain of 3 pounds or more or a weekly weight gain of more than 5 pounds.   If you have voicemail, please make sure your mailbox is cleaned out so that we may leave a message and please make sure to listen to any voicemails.    Bring ALL medications including any over the counter or vitamins to every visit.      

## 2022-02-09 NOTE — Telephone Encounter (Signed)
Spoke with patient regarding BMP results obtained earlier today. Renal function and potassium are normal although potassium level has declined since hospital discharge. Currently taking furosemide 40mg  & spironolactone 25mg  daily without potassium supplementation.  ? ?Discussed decreasing his furosemide to 20mg  daily but he admits that he's hesitant to do that for fear of gaining the fluid back. Will recheck his labs next week and if need be at that time, will add potassium supplement.  ? ?He was appreciative of this and will go get his labs drawn one day next week.  ?

## 2022-02-12 NOTE — Progress Notes (Deleted)
Subjective:    Mario Beck - 41 y.o. male MRN [id number]  Date of birth: July 14, 1981  HPI  Mario Beck is to establish care and hospital discharge follow-up.  Current issues and/or concerns: Hospital discharge follow-up: 01/28/2022 - 02/01/2022 [facility] per MD note: Admit date: 01/28/2022 Discharge date: 02/01/2022   Time spent: 37 minutes   Recommendations for Outpatient Follow-up:  Follow-up at the free clinic/[facility] clinic in [facility] Recommend outpatient chest x-ray in about 1 to 2 months-complete all of the antibiotics that we have prescribed You will need to quit eventually smoking as this will help heal your lungs-please ensure that you get the resources you need He will probably need an echocardiogram in about a month's time and a TOC follow-up with cardiology because of his nonischemic cardiomyopathy   Discharge Diagnoses:  MAIN problem for hospitalization    Pleural effusion loculated 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%   Please see below for itemized issues addressed in HOpsital- refer to other progress notes for clarity if needed   Discharge Condition: Improved   Diet recommendation: Low-salt  02/17/2022: Followed by Cards last appt 02/09/2022 Followed by Pulmonary appt 03/23/2022  ROS per HPI     Health Maintenance:  - *** Health Maintenance Due  Topic Date Due   COVID-19 Vaccine (1) Never done   TETANUS/TDAP  Never done     Past Medical History: Patient Active Problem List   Diagnosis Date Noted   Pleural effusion 01/29/2022   Asthma 01/29/2022   Alcohol abuse 01/29/2022   Hyperlipidemia 01/29/2022   Leukocytosis 01/24/2022   Parapneumonic effusion 01/24/2022   Cardiogenic shock (HCC) 01/19/2022   Pleuritic chest pain 01/19/2022   CAP (community acquired pneumonia) 01/19/2022   Pleural effusion on right 01/19/2022   Acute pulmonary embolism (HCC) 01/19/2022   Acute on chronic HFrEF (heart failure with reduced  ejection fraction) (HCC) 01/17/2022   Syncope 01/17/2022   AKI (acute kidney injury) (HCC) 01/17/2022   Hyperkalemia 01/17/2022   Lactic acidosis 01/17/2022   Hypokalemia 06/18/2021   COVID-19 virus infection 06/18/2021   Hypomagnesemia 06/18/2021   Acute on chronic systolic CHF (congestive heart failure) (HCC) 06/17/2021   Acute on chronic combined systolic and diastolic CHF (congestive heart failure) (HCC) 06/17/2021   Epigastric pain    Alcohol withdrawal syndrome without complication (HCC)    Recurrent pancreatitis 02/19/2021   Chronic systolic CHF (congestive heart failure) (HCC) 09/17/2020   Alcoholic cardiomyopathy (HCC) 43/83/8184   Alcoholic pancreatitis 09/16/2020   Tobacco abuse 09/16/2020   Abnormal EKG 09/16/2020   GERD (gastroesophageal reflux disease) 09/16/2020   Depression 09/16/2020   Dark stools 09/16/2020   Elevated troponin 09/16/2020   Transaminitis 03/31/2020   Nausea and vomiting 02/21/2020   Intractable nausea and vomiting 02/21/2020   Tobacco abuse counseling    Acute alcoholic pancreatitis 02/18/2020   HTN (hypertension), malignant 02/18/2020   Alcohol use disorder, moderate, dependence (HCC) 02/18/2020   Essential hypertension 12/01/2015      Social History   reports that he has been smoking cigarettes. He has been smoking an average of 1 pack per day. His smokeless tobacco use includes snuff. He reports that he does not currently use alcohol after a past usage of about 7.0 standard drinks per week. He reports that he does not use drugs.   Family History  family history is not on file.   Medications: reviewed and updated   Objective:   Physical Exam There were no vitals taken for  this visit. Physical Exam      Assessment & Plan:         Patient was given clear instructions to go to Emergency Department or return to medical center if symptoms don't improve, worsen, or new problems develop.The patient verbalized understanding.  I  discussed the assessment and treatment plan with the patient. The patient was provided an opportunity to ask questions and all were answered. The patient agreed with the plan and demonstrated an understanding of the instructions.   The patient was advised to call back or seek an in-person evaluation if the symptoms worsen or if the condition fails to improve as anticipated.    Durene Fruits, NP 02/12/2022, 6:05 PM Primary Care at [facility]

## 2022-02-16 ENCOUNTER — Telehealth: Payer: Self-pay | Admitting: Family

## 2022-02-16 NOTE — Telephone Encounter (Signed)
Reminded Patient that he as an [place] medical [place] for labs and if he had any questions or concerns to call. ? ? ? ?Mario Beck, NT ?

## 2022-02-17 ENCOUNTER — Inpatient Hospital Stay: Payer: Self-pay | Admitting: Family

## 2022-02-17 DIAGNOSIS — J90 Pleural effusion, not elsewhere classified: Secondary | ICD-10-CM

## 2022-02-17 DIAGNOSIS — I10 Essential (primary) hypertension: Secondary | ICD-10-CM

## 2022-02-17 DIAGNOSIS — Z7689 Persons encountering health services in other specified circumstances: Secondary | ICD-10-CM

## 2022-02-17 DIAGNOSIS — I428 Other cardiomyopathies: Secondary | ICD-10-CM

## 2022-02-17 DIAGNOSIS — Z09 Encounter for follow-up examination after completed treatment for conditions other than malignant neoplasm: Secondary | ICD-10-CM

## 2022-02-17 DIAGNOSIS — I5022 Chronic systolic (congestive) heart failure: Secondary | ICD-10-CM

## 2022-02-22 ENCOUNTER — Other Ambulatory Visit: Payer: Self-pay

## 2022-02-23 ENCOUNTER — Other Ambulatory Visit: Payer: Self-pay

## 2022-02-24 ENCOUNTER — Telehealth: Payer: Self-pay | Admitting: Pharmacy Technician

## 2022-02-24 ENCOUNTER — Other Ambulatory Visit: Payer: Self-pay | Admitting: Family

## 2022-02-24 ENCOUNTER — Other Ambulatory Visit: Payer: Self-pay

## 2022-02-24 MED ORDER — PANTOPRAZOLE SODIUM 40 MG PO TBEC
40.0000 mg | DELAYED_RELEASE_TABLET | Freq: Every day | ORAL | 0 refills | Status: AC
Start: 2022-02-24 — End: ?
  Filled 2022-02-24 (×2): qty 90, 90d supply, fill #0

## 2022-02-24 MED ORDER — FUROSEMIDE 40 MG PO TABS
40.0000 mg | ORAL_TABLET | Freq: Every day | ORAL | 0 refills | Status: AC
  Filled 2022-02-24: qty 90, 90d supply, fill #0

## 2022-02-24 NOTE — Telephone Encounter (Signed)
Signed only DOH Attestation.  Would need to provide poi for current year if PAP medications are needed. ? ?Jacquelynn Cree ?Patient Advocate Specialist ?[place] [facility] ?

## 2022-02-25 ENCOUNTER — Other Ambulatory Visit: Payer: Self-pay

## 2022-02-27 ENCOUNTER — Other Ambulatory Visit: Payer: Self-pay

## 2022-02-27 ENCOUNTER — Emergency Department: Payer: Medicaid Other

## 2022-02-27 ENCOUNTER — Emergency Department
Admission: EM | Admit: 2022-02-27 | Discharge: 2022-02-27 | Disposition: A | Discharge status: Home / Self Care | Payer: Medicaid Other | Attending: Emergency Medicine | Admitting: Emergency Medicine

## 2022-02-27 DIAGNOSIS — J189 Pneumonia, unspecified organism: Secondary | ICD-10-CM | POA: Diagnosis not present

## 2022-02-27 DIAGNOSIS — F1010 Alcohol abuse, uncomplicated: Secondary | ICD-10-CM | POA: Diagnosis not present

## 2022-02-27 DIAGNOSIS — Z7901 Long term (current) use of anticoagulants: Secondary | ICD-10-CM | POA: Diagnosis not present

## 2022-02-27 DIAGNOSIS — Y908 Blood alcohol level of 240 mg/100 ml or more: Secondary | ICD-10-CM | POA: Diagnosis not present

## 2022-02-27 DIAGNOSIS — R5383 Other fatigue: Secondary | ICD-10-CM | POA: Diagnosis not present

## 2022-02-27 DIAGNOSIS — I509 Heart failure, unspecified: Secondary | ICD-10-CM | POA: Insufficient documentation

## 2022-02-27 DIAGNOSIS — Z79899 Other long term (current) drug therapy: Secondary | ICD-10-CM | POA: Diagnosis not present

## 2022-02-27 DIAGNOSIS — R42 Dizziness and giddiness: Secondary | ICD-10-CM | POA: Insufficient documentation

## 2022-02-27 DIAGNOSIS — I110 Hypertensive heart disease with heart failure: Secondary | ICD-10-CM | POA: Insufficient documentation

## 2022-02-27 LAB — BASIC METABOLIC PANEL
Anion gap: 14 (ref 5–15)
BUN: 23 mg/dL — ABNORMAL HIGH (ref 6–20)
CO2: 22 mmol/L (ref 22–32)
Calcium: 8.8 mg/dL — ABNORMAL LOW (ref 8.9–10.3)
Chloride: 103 mmol/L (ref 98–111)
Creatinine, Ser: 0.94 mg/dL (ref 0.61–1.24)
GFR, Estimated: >60 mL/min (ref >60)
Glucose, Bld: 98 mg/dL (ref 70–99)
Potassium: 3.8 mmol/L (ref 3.5–5.1)
Sodium: 139 mmol/L (ref 135–145)

## 2022-02-27 LAB — CBC WITH DIFFERENTIAL/PLATELET
Abs Immature Granulocytes: 0.01 10*3/uL (ref 0.00–0.07)
Basophils Absolute: 0 10*3/uL (ref 0.0–0.1)
Basophils Relative: 1 %
Eosinophils Absolute: 0 10*3/uL (ref 0.0–0.5)
Eosinophils Relative: 1 %
HCT: 36.1 % — ABNORMAL LOW (ref 39.0–52.0)
Hemoglobin: 12.2 g/dL — ABNORMAL LOW (ref 13.0–17.0)
Immature Granulocytes: 0 %
Lymphocytes Relative: 33 %
Lymphs Abs: 1.8 10*3/uL (ref 0.7–4.0)
MCH: 26.3 pg (ref 26.0–34.0)
MCHC: 33.8 g/dL (ref 30.0–36.0)
MCV: 77.8 fL — ABNORMAL LOW (ref 80.0–100.0)
Monocytes Absolute: 0.3 10*3/uL (ref 0.1–1.0)
Monocytes Relative: 6 %
Neutro Abs: 3.3 10*3/uL (ref 1.7–7.7)
Neutrophils Relative %: 59 %
Platelets: 175 10*3/uL (ref 150–400)
RBC: 4.64 MIL/uL (ref 4.22–5.81)
RDW: 18.4 % — ABNORMAL HIGH (ref 11.5–15.5)
WBC: 5.5 10*3/uL (ref 4.0–10.5)
nRBC: 0 % (ref 0.0–0.2)

## 2022-02-27 LAB — ETHANOL: Alcohol, Ethyl (B): 298 mg/dL — ABNORMAL HIGH (ref <10)

## 2022-02-27 LAB — MAGNESIUM: Magnesium: 2 mg/dL (ref 1.7–2.4)

## 2022-02-27 MED ORDER — SODIUM CHLORIDE 0.9 % IV SOLN
1.0000 g | Freq: Once | INTRAVENOUS | Status: AC
  Administered 2022-02-27: 1 g via INTRAVENOUS
  Filled 2022-02-27: qty 10

## 2022-02-27 MED ORDER — LEVOFLOXACIN 750 MG PO TABS
750.0000 mg | ORAL_TABLET | Freq: Every day | ORAL | 0 refills | Status: AC
  Filled 2022-02-27: qty 7, 7d supply, fill #0

## 2022-02-27 NOTE — ED Notes (Signed)
Report received from Julia, RN 

## 2022-02-27 NOTE — ED Notes (Signed)
Patient provided a sandwich tray and diet ginger ale ?

## 2022-02-27 NOTE — ED Provider Notes (Signed)
? ?[facility] ?Provider Note ? ? ? Event Date/Time  ? First MD Initiated Contact with Patient 02/27/22 1741   ?  (approximate) ? ? ?History  ? ?Chief Complaint ?Alcohol Intoxication and Fatigue ? ? ?HPI ? ?Mario Beck is a 41 y.o. male with past medical history of hypertension, nonischemic cardiomyopathy, CHF (EF less than 20%), pulmonary embolism, and alcohol abuse who presents to the ED complaining of fatigue and dizziness.  Patient reports that he was at the movie theater today and had consumed 3 beers as well as 2 shots of liquor.  He states he began to feel lightheaded and dizzy, with a sensation that he was going to pass out.  He denies any associated chest pain or shortness of breath, states he has otherwise been feeling well with no fevers, cough, nausea, vomiting, or abdominal pain.  He states he was previously sober but has started drinking again over the past week.  He states he is not drinking as heavily as he used to, but reports drinking a few beers and a few shots each day for the past week.  He states he has been compliant with all of his medications, including Eliquis and Lasix. ?  ? ? ?Physical Exam  ? ?Triage Vital Signs: ?ED Triage Vitals  ?Enc Vitals Group  ?   BP   ?   Pulse   ?   Resp   ?   Temp   ?   Temp src   ?   SpO2   ?   Weight   ?   Height   ?   Head Circumference   ?   Peak Flow   ?   Pain Score   ?   Pain Loc   ?   Pain Edu?   ?   Excl. in [place]?   ? ? ?Most recent vital signs: ?Vitals:  ? 02/27/22 2130 02/27/22 2200  ?BP: (!) 144/89 125/75  ?Pulse: 92 84  ?Resp: (!) 25 17  ?Temp:    ?SpO2: 99% 99%  ? ? ?Constitutional: Alert and oriented. ?Eyes: Conjunctivae are normal. ?Head: Atraumatic. ?Nose: No congestion/rhinnorhea. ?Mouth/Throat: Mucous membranes are moist.  ?Cardiovascular: Normal rate, regular rhythm. Grossly normal heart sounds.  2+ radial pulses bilaterally. ?Respiratory: Normal respiratory effort.  No retractions. Lungs CTAB. ?Gastrointestinal: Soft  and nontender. No distention. ?Musculoskeletal: No lower extremity tenderness nor edema.  ?Neurologic:  Normal speech and language. No gross focal neurologic deficits are appreciated. ? ? ? ?ED Results / Procedures / Treatments  ? ?Labs ?(all labs ordered are listed, but only abnormal results are displayed) ?Labs Reviewed  ?CBC WITH DIFFERENTIAL/PLATELET - Abnormal; Notable for the following components:  ?    Result Value  ? Hemoglobin 12.2 (*)   ? HCT 36.1 (*)   ? MCV 77.8 (*)   ? RDW 18.4 (*)   ? All other components within normal limits  ?BASIC METABOLIC PANEL - Abnormal; Notable for the following components:  ? BUN 23 (*)   ? Calcium 8.8 (*)   ? All other components within normal limits  ?ETHANOL - Abnormal; Notable for the following components:  ? Alcohol, Ethyl (B) 298 (*)   ? All other components within normal limits  ?MAGNESIUM  ? ? ? ?EKG ? ?ED ECG REPORT ?Tempie Hoist, the attending physician, personally viewed and interpreted this ECG. ? ? Date: 02/27/2022 ? EKG Time: 17:53 ? Rate: 87 ? Rhythm: normal sinus rhythm ? Axis:  Normal ? Intervals:none ? ST&T Change: Lateral T wave inversions similar to previous, LVH ? ?RADIOLOGY ?Chest x-ray reviewed and interpreted by me with right-sided infiltrates concerning for pneumonia, no effusion or edema noted. ? ?PROCEDURES: ? ?Critical Care performed: No ? ?Procedures ? ? ?MEDICATIONS ORDERED IN ED: ?Medications  ?cefTRIAXone (ROCEPHIN) 1 g in sodium chloride 0.9 % 100 mL IVPB (0 g Intravenous Stopped 02/27/22 2228)  ? ? ? ?IMPRESSION / MDM / ASSESSMENT AND PLAN / ED COURSE  ?I reviewed the triage vital signs and the nursing notes. ?             ?               ? ?41 y.o. male with past medical history of hypertension, nonischemic cardiomyopathy, CHF (EF less than 20%), PE on Eliquis, and alcohol abuse who presents to the ED complaining of dizziness and lightheadedness while watching a movie today after consuming a few beers and a couple of shots of  liquor. ? ?Differential diagnosis includes, but is not limited to, CHF exacerbation, recurrent pneumonia, dehydration, electrolyte abnormality, alcohol intoxication. ? ?Patient well-appearing and in no acute distress, vital signs are unremarkable.  He states he is feeling dizzy and lightheaded but he denies any chest pain or shortness of breath. EKG appears similar to previous with no acute ischemic changes, patient denies any chest pain or shortness of breath and I doubt ACS or PE, especially because he states he has been compliant with Eliquis.  We will screen chest x-ray for recurrent pneumonia or CHF exacerbation, also screen for electrolyte abnormality with his recent heavy alcohol consumption.  We will hold off on IV fluid administration given his known EF of less than 20%. ? ?Labs are reassuring with CBC showing stable anemia and no leukocytosis, BMP without electrolyte abnormality or AKI, magnesium level within normal limits.  Patient does have elevated ethanol level but now after greater than 4 hours of observation, appears clinically sober.  Chest x-ray does have significant right-sided infiltrates, although patient did have recent extended admission for right-sided pneumonia and effusion requiring chest tube placement.  Chest x-ray findings may be residual from this episode, although patient reports productive cough developing over the past 3 days.  He was given IV dose of Rocephin and we will treat with Levaquin given azithromycin could interact with his digoxin and he just completed course of Augmentin.  He is appropriate for discharge home with PCP and cardiology follow-up, was counseled to return to the ED for new worsening symptoms. ? ?  ? ? ?FINAL CLINICAL IMPRESSION(S) / ED DIAGNOSES  ? ?Final diagnoses:  ?Dizziness  ?Healthcare-associated pneumonia  ?Alcohol abuse  ? ? ? ?Rx / DC Orders  ? ?ED Discharge Orders   ? ?      Ordered  ?  levofloxacin (LEVAQUIN) 750 MG tablet  Daily       ? 02/27/22  2214  ? ?  ?  ? ?  ? ? ? ?Note:  This document was prepared using Dragon voice recognition software and may include unintentional dictation errors. ?  ?Blake Divine, MD ?02/27/22 2230 ? ?

## 2022-02-27 NOTE — ED Triage Notes (Signed)
Pt [place] today from the movie theatre after not "feeling right. PMH chf and was wanting some blood work due to his symptoms of not feeling well. EMS stated vs enroute 140/90, 99%, 90hr. Pt arrives and assisted to toilet. Pt stating they drank some alcohol today.  ?

## 2022-02-28 ENCOUNTER — Other Ambulatory Visit: Payer: Self-pay

## 2022-03-01 ENCOUNTER — Other Ambulatory Visit: Payer: Self-pay

## 2022-03-03 ENCOUNTER — Other Ambulatory Visit: Payer: Self-pay

## 2022-03-07 ENCOUNTER — Other Ambulatory Visit: Payer: Self-pay | Admitting: Family

## 2022-03-07 ENCOUNTER — Ambulatory Visit: Payer: Self-pay

## 2022-03-07 DIAGNOSIS — I5023 Acute on chronic systolic (congestive) heart failure: Secondary | ICD-10-CM

## 2022-03-07 NOTE — Progress Notes (Signed)
Gained 6 pounds overnight

## 2022-03-08 ENCOUNTER — Telehealth: Payer: Self-pay | Admitting: Family

## 2022-03-08 NOTE — Telephone Encounter (Signed)
LVM on 5/22 and 5/23 for patient after receiving a voicemail telling us patient was very SOB and felt he had to much fluid on over the weekend. We scheduled patient on 5/22 after receiving voicemail for IV lasix and lvm with patient with instructions and he did not show or return our call. I called again this morning on 5/23 to follow up patient and had to leave another voicemail.   Dorothe Elmore, NT

## 2022-03-09 NOTE — Progress Notes (Unsigned)
Patient ID: Mario Beck, male    DOB: [birth year], 41 y.o.   MRN: [id number]  HPI  Mr Campion is a 41 y/o male with a history of asthma, HTN, alcohol use, PE, depression, tobacco use and chronic heart failure.   Echo report from 01/18/22 reviewed and showed an EF 20-25% along with mild MR and moderate TR.   RHC/LHC done 01/21/22 showed: Mid LAD lesion is 40% stenosed.   There is severe left ventricular systolic dysfunction.   LV end diastolic pressure is mildly elevated.   The left ventricular ejection fraction is less than 25% by visual estimate.  1.  Moderate one-vessel coronary artery disease with 40% stenosis in the mid LAD.  No evidence of obstructive disease. 2.  Severely reduced LV systolic function with an EF of 15%. 3.  Right heart catheterization showed mildly elevated filling pressures, mild pulmonary hypertension and normal cardiac output.  Was in the ED 02/27/22 due to fatigue and dizziness. Patient reports that he was at the movie theater today and had consumed 3 beers as well as 2 shots of liquor. Labs were normal and he was monitored and then released. Admitted 01/28/22 after being transferred from OSH for cardiothoracic surgical consult. Right-sided pleural drain intact. Chest tube eventually removed. Medications adjusted. Discharged after 4 days. Admitted 01/17/22 due to dyspnea, fatigue and weight gain. Had passed out twice prior to admission. Found to have acute PE on the right with probable pulmonary infarct. Started on heparin drip, then transitioned to Eliquis. Cardiology, pulmonology and palliative care consults obtained. Underwent ultrasound-guided thoracentesis with 200 mL of blood-tinged fluid, exudate. s/p CT guided right pleural drain placement. Transferred after 11 days.   He presents today for a follow-up visit with a chief complaint of   Had called stating he was fluid overloaded, IV lasix was ordered but then he didn't show up for it.   Past Medical History:   Diagnosis Date   Alcohol abuse    Asthma    CHF (congestive heart failure) (HCC)    Depression    Hypertension    Pulmonary embolus (HCC)    Past Surgical History:  Procedure Laterality Date   FRACTURE SURGERY     Left leg as a kid   RIGHT/LEFT HEART CATH AND CORONARY ANGIOGRAPHY N/A 01/21/2022   Procedure: RIGHT/LEFT HEART CATH AND CORONARY ANGIOGRAPHY;  Surgeon: Iran Ouch, MD;  Location: [facility] INVASIVE CV LAB;  Service: Cardiovascular;  Laterality: N/A;   Family History  Problem Relation Age of Onset   Colon cancer Neg Hx    Esophageal cancer Neg Hx    Social History   Tobacco Use   Smoking status: Every Day    Packs/day: 1.00    Types: Cigarettes   Smokeless tobacco: Current    Types: Snuff   Tobacco comments:    Does pouches as welll   Substance Use Topics   Alcohol use: Not Currently    Alcohol/week: 7.0 standard drinks    Types: 7 Cans of beer per week   Allergies  Allergen Reactions   Lisinopril Swelling    Angioedema    Shellfish Allergy Swelling    Review of Systems  Constitutional:  Positive for fatigue. Negative for appetite change.  HENT:  Negative for congestion, rhinorrhea and sore throat.   Eyes: Negative.   Respiratory:  Positive for shortness of breath. Negative for cough and wheezing.   Cardiovascular:  Negative for chest pain, palpitations and leg swelling.  Gastrointestinal:  Negative  for abdominal distention and abdominal pain.  Endocrine: Negative.   Genitourinary: Negative.   Musculoskeletal:  Negative for back pain and neck pain.  Skin: Negative.   Allergic/Immunologic: Negative.   Neurological:  Negative for dizziness and light-headedness.  Hematological:  Negative for adenopathy. Does not bruise/bleed easily.  Psychiatric/Behavioral:  Negative for dysphoric mood and sleep disturbance (sleeping on 1 pillow). The patient is not nervous/anxious.      Physical Exam Vitals and nursing note reviewed.  Constitutional:       Appearance: Normal appearance.  HENT:     Head: Normocephalic and atraumatic.  Cardiovascular:     Rate and Rhythm: Normal rate and regular rhythm.  Pulmonary:     Effort: Pulmonary effort is normal. No respiratory distress.     Breath sounds: No wheezing or rales.  Abdominal:     General: There is no distension.     Palpations: Abdomen is soft.  Musculoskeletal:        General: No tenderness.     Cervical back: Normal range of motion and neck supple.     Right lower leg: No edema.     Left lower leg: No edema.  Skin:    General: Skin is warm and dry.  Neurological:     General: No focal deficit present.     Mental Status: He is alert and oriented to person, place, and time.  Psychiatric:        Mood and Affect: Mood normal.        Behavior: Behavior normal.        Thought Content: Thought content normal.   Assessment & Plan:  1: Chronic heart failure with reduced ejection fraction- - NYHA class II - euvolemic today - not weighing daily but does have working scales; reviewed the importance of weighing daily and to call for an overnight weight gain of > 2 pounds or a weekly weight gain of > 5 pounds - weight 191.2 from last visit here 1 month ago - trying to not add salt and read food labels for sodium content - on GDMT of carvedilol, farxiga, losartan and spironolactone - angioedema with lisinopril so not a candidate for entresto - continues to smoke "some" but is working on this - BNP 01/17/22 was 2253.2 - PharmD reconciled medications with the patient  2: HTN- - BP  - currently has no PCP as he's uninsured; is starting a new job tomorrow and isn't sure what or if he will have medical 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) - asked him to call us back once he knows that information and we can make a referral to either [facility] or local PCP office depending on if he has insurance or not - BMP 02/27/22 reviewed and showed sodium 139, potassium 3.8, creatinine 0.94 and GFR  >60  3: Depression- - stable at this time - looking forward to getting back to work - has been walking for exercise and is going to get started back at the gym; instructed to start slowly  4: Alcohol use- - says that he started drinking again although "not as much" - did have a recent ED visit due to drinking at the movie theatre and had a blood alcohol level of 298   Patient did not bring his medications nor a list. Each medication was verbally reviewed with the patient and he was encouraged to bring the bottles to every visit to confirm accuracy of list.

## 2022-03-10 ENCOUNTER — Telehealth: Payer: Self-pay | Admitting: Family

## 2022-03-10 ENCOUNTER — Ambulatory Visit: Payer: Self-pay | Admitting: Family

## 2022-03-10 NOTE — Telephone Encounter (Signed)
Patient did not show for his [facility] appointment on 03/10/22. Will attempt to reschedule.

## 2022-03-14 ENCOUNTER — Encounter: Payer: Self-pay | Admitting: Emergency Medicine

## 2022-03-14 ENCOUNTER — Emergency Department: Payer: Medicaid Other

## 2022-03-14 ENCOUNTER — Other Ambulatory Visit: Payer: Self-pay

## 2022-03-14 ENCOUNTER — Emergency Department
Admission: EM | Admit: 2022-03-14 | Discharge: 2022-03-14 | Disposition: A | Discharge status: Home / Self Care | Payer: Medicaid Other | Attending: Emergency Medicine | Admitting: Emergency Medicine

## 2022-03-14 DIAGNOSIS — R110 Nausea: Secondary | ICD-10-CM | POA: Diagnosis not present

## 2022-03-14 DIAGNOSIS — R059 Cough, unspecified: Secondary | ICD-10-CM | POA: Diagnosis present

## 2022-03-14 DIAGNOSIS — E860 Dehydration: Secondary | ICD-10-CM | POA: Diagnosis not present

## 2022-03-14 DIAGNOSIS — J90 Pleural effusion, not elsewhere classified: Secondary | ICD-10-CM | POA: Diagnosis not present

## 2022-03-14 DIAGNOSIS — I509 Heart failure, unspecified: Secondary | ICD-10-CM | POA: Diagnosis not present

## 2022-03-14 DIAGNOSIS — I110 Hypertensive heart disease with heart failure: Secondary | ICD-10-CM | POA: Diagnosis not present

## 2022-03-14 DIAGNOSIS — R000 Tachycardia, unspecified: Secondary | ICD-10-CM | POA: Insufficient documentation

## 2022-03-14 LAB — CBC
HCT: 38.2 % — ABNORMAL LOW (ref 39.0–52.0)
Hemoglobin: 12.8 g/dL — ABNORMAL LOW (ref 13.0–17.0)
MCH: 26.7 pg (ref 26.0–34.0)
MCHC: 33.5 g/dL (ref 30.0–36.0)
MCV: 79.7 fL — ABNORMAL LOW (ref 80.0–100.0)
Platelets: 205 10*3/uL (ref 150–400)
RBC: 4.79 MIL/uL (ref 4.22–5.81)
RDW: 19 % — ABNORMAL HIGH (ref 11.5–15.5)
WBC: 4.6 10*3/uL (ref 4.0–10.5)
nRBC: 0 % (ref 0.0–0.2)

## 2022-03-14 LAB — COMPREHENSIVE METABOLIC PANEL
ALT: 28 U/L (ref 0–44)
AST: 59 U/L — ABNORMAL HIGH (ref 15–41)
Albumin: 3.8 g/dL (ref 3.5–5.0)
Alkaline Phosphatase: 132 U/L — ABNORMAL HIGH (ref 38–126)
Anion gap: 16 — ABNORMAL HIGH (ref 5–15)
BUN: 24 mg/dL — ABNORMAL HIGH (ref 6–20)
CO2: 23 mmol/L (ref 22–32)
Calcium: 8.6 mg/dL — ABNORMAL LOW (ref 8.9–10.3)
Chloride: 100 mmol/L (ref 98–111)
Creatinine, Ser: 0.71 mg/dL (ref 0.61–1.24)
GFR, Estimated: >60 mL/min (ref >60)
Glucose, Bld: 119 mg/dL — ABNORMAL HIGH (ref 70–99)
Potassium: 3.7 mmol/L (ref 3.5–5.1)
Sodium: 139 mmol/L (ref 135–145)
Total Bilirubin: 1 mg/dL (ref 0.3–1.2)
Total Protein: 7.4 g/dL (ref 6.5–8.1)

## 2022-03-14 LAB — URINALYSIS, ROUTINE W REFLEX MICROSCOPIC
Bacteria, UA: NONE SEEN
Bilirubin Urine: NEGATIVE
Glucose, UA: NEGATIVE mg/dL
Hgb urine dipstick: NEGATIVE
Ketones, ur: NEGATIVE mg/dL
Leukocytes,Ua: NEGATIVE
Nitrite: NEGATIVE
Protein, ur: 100 mg/dL — AB
Specific Gravity, Urine: 1.016 (ref 1.005–1.030)
pH: 5 (ref 5.0–8.0)

## 2022-03-14 LAB — LIPASE, BLOOD: Lipase: 32 U/L (ref 11–51)

## 2022-03-14 MED ORDER — AZITHROMYCIN 250 MG PO TABS: ORAL_TABLET | ORAL | 0 refills | Status: AC

## 2022-03-14 MED ORDER — ONDANSETRON 4 MG PO TBDP: 4.0000 mg | ORAL_TABLET | Freq: Three times a day (TID) | ORAL | 0 refills | Status: AC | PRN

## 2022-03-14 MED ORDER — ONDANSETRON 4 MG PO TBDP
4.0000 mg | ORAL_TABLET | Freq: Once | ORAL | Status: AC
  Administered 2022-03-14: 4 mg via ORAL
  Filled 2022-03-14: qty 1

## 2022-03-14 MED ORDER — CEFDINIR 300 MG PO CAPS: 300.0000 mg | ORAL_CAPSULE | Freq: Two times a day (BID) | ORAL | 0 refills | Status: AC

## 2022-03-14 NOTE — ED Notes (Signed)
dc ppw provided to patient. qusetions, followup and rx info addressed. PT provides verbal consent for dc at this time. pt ambulatory to lobby alert and oriented x4. 

## 2022-03-14 NOTE — ED Notes (Signed)
PT stating he cannot pee at this time

## 2022-03-14 NOTE — ED Provider Notes (Signed)
[facility] Provider Note    Event Date/Time   First MD Initiated Contact with Patient 03/14/22 1548     (approximate)   History   Cough and Emesis   HPI  Mario Beck is a 41 y.o. male with extensive past medical history including CHF secondary to alcohol abuse, PE, hypertension who presents with complaints of cough, nausea, fatigue.  Patient is concerned that his pneumonia has returned.  Review of medical records demonstrates the patient was discharged from the hospital after an admission on January 28, 2022 where he was treated for pneumonia with a parapneumonic effusion.  Patient denies significant shortness of breath does not think that he has had fevers     Physical Exam   Triage Vital Signs: ED Triage Vitals  Enc Vitals Group     BP 03/14/22 1449 125/90     Pulse Rate 03/14/22 1449 (!) 108     Resp 03/14/22 1449 16     Temp 03/14/22 1450 98.8 F (37.1 C)     Temp Source 03/14/22 1450 Oral     SpO2 03/14/22 1449 96 %     Weight 03/14/22 1447 86.2 kg (190 lb)     Height 03/14/22 1447 1.905 m (6\' 3" )     Head Circumference --      Peak Flow --      Pain Score 03/14/22 1446 10     Pain Loc --      Pain Edu? --      Excl. in [place]? --     Most recent vital signs: Vitals:   03/14/22 1449 03/14/22 1450  BP: 125/90   Pulse: (!) 108   Resp: 16   Temp:  98.8 F (37.1 C)  SpO2: 96%      General: Awake, no distress.  CV:  Good peripheral perfusion.  Mild tachycardia Resp:  Normal effort.  Bibasilar Rales Abd:  No distention.  Other:     ED Results / Procedures / Treatments   Labs (all labs ordered are listed, but only abnormal results are displayed) Labs Reviewed  COMPREHENSIVE METABOLIC PANEL - Abnormal; Notable for the following components:      Result Value   Glucose, Bld 119 (*)    BUN 24 (*)    Calcium 8.6 (*)    AST 59 (*)    Alkaline Phosphatase 132 (*)    Anion gap 16 (*)    All other components within normal limits   CBC - Abnormal; Notable for the following components:   Hemoglobin 12.8 (*)    HCT 38.2 (*)    MCV 79.7 (*)    RDW 19.0 (*)    All other components within normal limits  URINALYSIS, ROUTINE W REFLEX MICROSCOPIC - Abnormal; Notable for the following components:   Color, Urine YELLOW (*)    APPearance CLEAR (*)    Protein, ur 100 (*)    All other components within normal limits  LIPASE, BLOOD     EKG     RADIOLOGY Chest x-ray viewed interpreted by me, right lower lobe opacity, not significantly changed from prior    PROCEDURES:  Critical Care performed:   Procedures   MEDICATIONS ORDERED IN ED: Medications  ondansetron (ZOFRAN-ODT) disintegrating tablet 4 mg (4 mg Oral Given 03/14/22 1708)     IMPRESSION / MDM / ASSESSMENT AND PLAN / ED COURSE  I reviewed the triage vital signs and the nursing notes. Patient's presentation is most consistent with exacerbation  of chronic illness.  Patient with a history of pleural effusion, pneumonia over 1 month ago presents with fatigue, cough, mild nausea  He is concerned this is a return of his pneumonia which is certainly a possibility.  Differential also includes CHF exacerbation, viral illness  Patient is afebrile, lab work is reassuring, normal white blood cell count, BMP not significantly changed from prior, mild dehydration noted  Chest x-ray does not show significant change from prior.  Considered admission for further evaluation and work-up however given patient's overall well appearance and desire to try antibiotics at home we will discharge patient on third-generation cephalosporin and macrolide  Patient tolerated p.o.'s in the emergency department   He knows to return if any worsening     FINAL CLINICAL IMPRESSION(S) / ED DIAGNOSES   Final diagnoses:  Pleural effusion     Rx / DC Orders   ED Discharge Orders          Ordered    cefdinir (OMNICEF) 300 MG capsule  2 times daily        03/14/22 1656     azithromycin (ZITHROMAX Z-PAK) 250 MG tablet        03/14/22 1656    ondansetron (ZOFRAN-ODT) 4 MG disintegrating tablet  Every 8 hours PRN        03/14/22 1701             Note:  This document was prepared using Dragon voice recognition software and may include unintentional dictation errors.   Lavonia Drafts, MD 03/14/22 [phone]

## 2022-03-14 NOTE — ED Notes (Signed)
PT eating and drinking asking to go home

## 2022-03-14 NOTE — ED Triage Notes (Signed)
Pt to ED via POV stating that he is having cough, vomiting, and chills. Pt states that he thinks that he has pneumonia. Pt has not checked his temperature but believes that he has had fever as well. Pt is currently in NAD.

## 2022-03-15 ENCOUNTER — Encounter: Payer: Self-pay | Admitting: Emergency Medicine

## 2022-03-15 DIAGNOSIS — J45909 Unspecified asthma, uncomplicated: Secondary | ICD-10-CM | POA: Diagnosis not present

## 2022-03-15 DIAGNOSIS — I5043 Acute on chronic combined systolic (congestive) and diastolic (congestive) heart failure: Secondary | ICD-10-CM | POA: Insufficient documentation

## 2022-03-15 DIAGNOSIS — D72829 Elevated white blood cell count, unspecified: Secondary | ICD-10-CM | POA: Diagnosis not present

## 2022-03-15 DIAGNOSIS — I110 Hypertensive heart disease with heart failure: Secondary | ICD-10-CM | POA: Diagnosis not present

## 2022-03-15 DIAGNOSIS — K852 Alcohol induced acute pancreatitis without necrosis or infection: Secondary | ICD-10-CM | POA: Insufficient documentation

## 2022-03-15 DIAGNOSIS — R1013 Epigastric pain: Secondary | ICD-10-CM | POA: Diagnosis present

## 2022-03-15 DIAGNOSIS — Z8616 Personal history of COVID-19: Secondary | ICD-10-CM | POA: Insufficient documentation

## 2022-03-15 MED ORDER — ACETAMINOPHEN 325 MG PO TABS
650.0000 mg | ORAL_TABLET | Freq: Once | ORAL | Status: AC
  Administered 2022-03-15: 650 mg via ORAL
  Filled 2022-03-15: qty 2

## 2022-03-15 NOTE — ED Triage Notes (Signed)
Pt presents via POV with complaints of lower abdominal pain that started earlier today. He notes taking Tylenol PTA without much improvement. Denies N/V/D.

## 2022-03-16 ENCOUNTER — Emergency Department: Payer: Medicaid Other

## 2022-03-16 ENCOUNTER — Ambulatory Visit: Admit: 2022-03-16 | Payer: Self-pay | Attending: Student | Admitting: Student

## 2022-03-16 ENCOUNTER — Emergency Department
Admission: EM | Admit: 2022-03-16 | Discharge: 2022-03-16 | Disposition: A | Discharge status: Home / Self Care | Payer: Medicaid Other | Attending: Emergency Medicine | Admitting: Emergency Medicine

## 2022-03-16 DIAGNOSIS — K852 Alcohol induced acute pancreatitis without necrosis or infection: Secondary | ICD-10-CM

## 2022-03-16 LAB — URINALYSIS, ROUTINE W REFLEX MICROSCOPIC
Bacteria, UA: NONE SEEN
Bilirubin Urine: NEGATIVE
Glucose, UA: NEGATIVE mg/dL
Ketones, ur: NEGATIVE mg/dL
Leukocytes,Ua: NEGATIVE
Nitrite: NEGATIVE
Protein, ur: 100 mg/dL — AB
Specific Gravity, Urine: 1.02 (ref 1.005–1.030)
pH: 5 (ref 5.0–8.0)

## 2022-03-16 LAB — CBC
HCT: 43.1 % (ref 39.0–52.0)
Hemoglobin: 14.3 g/dL (ref 13.0–17.0)
MCH: 26.6 pg (ref 26.0–34.0)
MCHC: 33.2 g/dL (ref 30.0–36.0)
MCV: 80.3 fL (ref 80.0–100.0)
Platelets: 179 10*3/uL (ref 150–400)
RBC: 5.37 MIL/uL (ref 4.22–5.81)
RDW: 19.4 % — ABNORMAL HIGH (ref 11.5–15.5)
WBC: 4.8 10*3/uL (ref 4.0–10.5)
nRBC: 0 % (ref 0.0–0.2)

## 2022-03-16 LAB — COMPREHENSIVE METABOLIC PANEL
ALT: 29 U/L (ref 0–44)
AST: 50 U/L — ABNORMAL HIGH (ref 15–41)
Albumin: 3.8 g/dL (ref 3.5–5.0)
Alkaline Phosphatase: 146 U/L — ABNORMAL HIGH (ref 38–126)
Anion gap: 15 (ref 5–15)
BUN: 26 mg/dL — ABNORMAL HIGH (ref 6–20)
CO2: 24 mmol/L (ref 22–32)
Calcium: 9.2 mg/dL (ref 8.9–10.3)
Chloride: 95 mmol/L — ABNORMAL LOW (ref 98–111)
Creatinine, Ser: 0.98 mg/dL (ref 0.61–1.24)
GFR, Estimated: >60 mL/min (ref >60)
Glucose, Bld: 134 mg/dL — ABNORMAL HIGH (ref 70–99)
Potassium: 3.7 mmol/L (ref 3.5–5.1)
Sodium: 134 mmol/L — ABNORMAL LOW (ref 135–145)
Total Bilirubin: 1.3 mg/dL — ABNORMAL HIGH (ref 0.3–1.2)
Total Protein: 7.4 g/dL (ref 6.5–8.1)

## 2022-03-16 LAB — LIPASE, BLOOD: Lipase: 46 U/L (ref 11–51)

## 2022-03-16 MED ORDER — ALUM & MAG HYDROXIDE-SIMETH 200-200-20 MG/5ML PO SUSP
30.0000 mL | Freq: Once | ORAL | Status: AC
  Administered 2022-03-16: 30 mL via ORAL
  Filled 2022-03-16: qty 30

## 2022-03-16 MED ORDER — MORPHINE SULFATE (PF) 4 MG/ML IV SOLN
4.0000 mg | Freq: Once | INTRAVENOUS | Status: AC
  Administered 2022-03-16: 4 mg via INTRAVENOUS
  Filled 2022-03-16: qty 1

## 2022-03-16 MED ORDER — ACETAMINOPHEN 325 MG PO TABS: 650.0000 mg | ORAL_TABLET | Freq: Four times a day (QID) | ORAL | 2 refills | Status: AC | PRN

## 2022-03-16 MED ORDER — MORPHINE SULFATE (PF) 2 MG/ML IV SOLN
2.0000 mg | Freq: Once | INTRAVENOUS | Status: AC
  Administered 2022-03-16: 2 mg via INTRAVENOUS
  Filled 2022-03-16: qty 1

## 2022-03-16 MED ORDER — PANTOPRAZOLE SODIUM 40 MG IV SOLR
40.0000 mg | Freq: Once | INTRAVENOUS | Status: AC
  Administered 2022-03-16: 40 mg via INTRAVENOUS
  Filled 2022-03-16: qty 10

## 2022-03-16 MED ORDER — IOHEXOL 300 MG/ML  SOLN
100.0000 mL | Freq: Once | INTRAMUSCULAR | Status: AC | PRN
  Administered 2022-03-16: 100 mL via INTRAVENOUS

## 2022-03-16 NOTE — ED Notes (Signed)
Pt used call bell to notify of abdominal pain. Pt states "I woke up with abdominal pain again" and pt is requesting something for his pain. Provider JP notified in person. Pt's resp reg/unlabored, skin dry and pt is calmly sitting on stretcher. No new orders; provider states will talk with pt soon.

## 2022-03-16 NOTE — Discharge Instructions (Signed)
Your CAT scan shows pancreatitis.  As discussed, please have a liquid/very bland diet.  Please do not drink alcohol.  Please return the emergency department immediately for any new, worsening, or change in symptoms or other concerns including shakiness, worsening pain, inability to eat or drink, fevers, or any other concerns.  You may take the medication as prescribed.

## 2022-03-16 NOTE — ED Notes (Signed)
Pt c/o upper abd pain with N/V since yesterday. Denies diarrhea , states last BM last night.

## 2022-03-16 NOTE — ED Provider Notes (Signed)
[facility] Provider Note    Event Date/Time   First MD Initiated Contact with Patient 03/16/22 [phone]     (approximate)   History   Abdominal Pain   HPI  Mario Beck is a 41 y.o. male with a past medical history of chronic systolic heart failure (EF less than 20%), pulmonary embolism, alcoholic cardiomyopathy, depression, anxiety, hypertension, alcohol use disorder, recent admission on January 28, 2022 when he was treated for pneumonia with parapneumonic effusion who presents today for evaluation of abdominal pain.  Patient reports that his pain is primarily in the epigastrium and feels like when he has had pancreatitis in the past.  He has not had any nausea or vomiting.  He reports that the pain is burning in nature.  He reports that he has not had any alcohol in the past 2 days.  He denies chest pain or shortness of breath.  No fevers or chills.  No urinary complaints or change in bowel movements.  He denies feeling tremulousness.  Denies history of alcohol withdrawal or alcohol withdrawal seizures.  Patient Active Problem List   Diagnosis Date Noted   Pleural effusion 01/29/2022   Asthma 01/29/2022   Alcohol abuse 01/29/2022   Hyperlipidemia 01/29/2022   Leukocytosis 01/24/2022   Parapneumonic effusion 01/24/2022   Cardiogenic shock (HCC) 01/19/2022   Pleuritic chest pain 01/19/2022   CAP (community acquired pneumonia) 01/19/2022   Pleural effusion on right 01/19/2022   Acute pulmonary embolism (HCC) 01/19/2022   Acute on chronic HFrEF (heart failure with reduced ejection fraction) (HCC) 01/17/2022   Syncope 01/17/2022   AKI (acute kidney injury) (HCC) 01/17/2022   Hyperkalemia 01/17/2022   Lactic acidosis 01/17/2022   Hypokalemia 06/18/2021   COVID-19 virus infection 06/18/2021   Hypomagnesemia 06/18/2021   Acute on chronic systolic CHF (congestive heart failure) (HCC) 06/17/2021   Acute on chronic combined systolic and diastolic CHF (congestive  heart failure) (HCC) 06/17/2021   Epigastric pain    Alcohol withdrawal syndrome without complication (HCC)    Recurrent pancreatitis 02/19/2021   Chronic systolic CHF (congestive heart failure) (HCC) 09/17/2020   Alcoholic cardiomyopathy (HCC) 56/81/2751   Alcoholic pancreatitis 09/16/2020   Tobacco abuse 09/16/2020   Abnormal EKG 09/16/2020   GERD (gastroesophageal reflux disease) 09/16/2020   Depression 09/16/2020   Dark stools 09/16/2020   Elevated troponin 09/16/2020   Transaminitis 03/31/2020   Nausea and vomiting 02/21/2020   Intractable nausea and vomiting 02/21/2020   Tobacco abuse counseling    Acute alcoholic pancreatitis 02/18/2020   HTN (hypertension), malignant 02/18/2020   Alcohol use disorder, moderate, dependence (HCC) 02/18/2020   Essential hypertension 12/01/2015          Physical Exam   Triage Vital Signs: ED Triage Vitals  Enc Vitals Group     BP 03/15/22 2349 120/87     Pulse Rate 03/15/22 2349 97     Resp 03/15/22 2349 20     Temp 03/15/22 2349 98.1 F (36.7 C)     Temp Source 03/15/22 2349 Oral     SpO2 03/15/22 2349 100 %     Weight 03/15/22 2347 180 lb (81.6 kg)     Height 03/15/22 2347 6\' 3"  (1.905 m)     Head Circumference --      Peak Flow --      Pain Score 03/15/22 2347 10     Pain Loc --      Pain Edu? --      Excl. in  GC? --     Most recent vital signs: Vitals:   03/16/22 1200 03/16/22 1205  BP: (!) 164/118   Pulse:    Resp:  18  Temp:  98.1 F (36.7 C)  SpO2:      Physical Exam Vitals and nursing note reviewed.  Constitutional:      General: Awake and alert. No acute distress.    Appearance: Normal appearance. He is well-developed and normal weight.  HENT:     Head: Normocephalic and atraumatic.     Mouth/Throat: No tongue fasciculations    Mouth: Mucous membranes are moist.  Eyes:     General: PERRL. Normal EOMs        Right eye: No discharge.        Left eye: No discharge.     Conjunctiva/sclera: Conjunctivae  normal.  Cardiovascular:     Rate and Rhythm: Normal rate and regular rhythm.     Pulses: Normal pulses.     Heart sounds: Normal heart sounds Pulmonary:     Effort: Pulmonary effort is normal. No respiratory distress.     Breath sounds: Normal breath sounds.  Abdominal:     Abdomen is soft. There is mild epigastric abdominal tenderness. No rebound or guarding. No distention. Musculoskeletal:        General: No swelling. Normal range of motion.     Cervical back: Normal range of motion and neck supple.  No tremulousness Lymphadenopathy:     Cervical: No cervical adenopathy.  Skin:    General: Skin is warm and dry.     Capillary Refill: Capillary refill takes less than 2 seconds.     Findings: No rash.  Neurological:     Mental Status: He is alert.  Does not appear to be under the influence     ED Results / Procedures / Treatments   Labs (all labs ordered are listed, but only abnormal results are displayed) Labs Reviewed  COMPREHENSIVE METABOLIC PANEL - Abnormal; Notable for the following components:      Result Value   Sodium 134 (*)    Chloride 95 (*)    Glucose, Bld 134 (*)    BUN 26 (*)    AST 50 (*)    Alkaline Phosphatase 146 (*)    Total Bilirubin 1.3 (*)    All other components within normal limits  CBC - Abnormal; Notable for the following components:   RDW 19.4 (*)    All other components within normal limits  URINALYSIS, ROUTINE W REFLEX MICROSCOPIC - Abnormal; Notable for the following components:   Color, Urine YELLOW (*)    APPearance CLEAR (*)    Hgb urine dipstick SMALL (*)    Protein, ur 100 (*)    All other components within normal limits  LIPASE, BLOOD     EKG     RADIOLOGY I independently reviewed imaging and agree with the radiologist findings    PROCEDURES:  Critical Care performed:   Procedures   MEDICATIONS ORDERED IN ED: Medications  acetaminophen (TYLENOL) tablet 650 mg (650 mg Oral Given 03/15/22 2351)  pantoprazole  (PROTONIX) injection 40 mg (40 mg Intravenous Given 03/16/22 0816)  alum & mag hydroxide-simeth (MAALOX/MYLANTA) 200-200-20 MG/5ML suspension 30 mL (30 mLs Oral Given 03/16/22 0816)  iohexol (OMNIPAQUE) 300 MG/ML solution 100 mL (100 mLs Intravenous Contrast Given 03/16/22 0824)  morphine (PF) 2 MG/ML injection 2 mg (2 mg Intravenous Given 03/16/22 0837)  morphine (PF) 4 MG/ML injection 4 mg (4 mg Intravenous Given  03/16/22 1107)     IMPRESSION / MDM / ASSESSMENT AND PLAN / ED COURSE  I reviewed the triage vital signs and the nursing notes.   Differential diagnosis includes, but is not limited to, pancreatitis, Boerhaave's syndrome, gastritis, peptic ulcer disease, appendicitis, cholecystitis, biliary colic.  Patient is awake and alert, hypertensive, but hemodynamically stable and afebrile.  He has epigastric abdominal tenderness.  Labs are obtained and are overall reassuring.  He was treated with GI cocktail, Pepcid, and morphine with improvement of his symptoms.  However, given his multiple comorbidities CT scan was obtained which demonstrates interstitial pancreatitis.  He is able to tolerate p.o.  There is no evidence of necrosis or pseudocyst.  We discussed the options for admission versus discharge and pain control.  Patient prefers to be discharged.  We discussed the importance of cutting down on his alcohol intake.  He clinically does not appear to be withdrawing, no tongue fasciculations, denies any history of alcohol withdrawal or seizures.  We discussed very strict return precautions.  He was given his very small amount of oxycodone though advised that this is highly addictive and should only be used for breakthrough pain.  Also advised that he cannot drive, operate heavy machinery, or perform tasks require concentration while taking this medication.  Case was discussed with Dr. Scotty Court who agrees with assessment and plan   Patient's presentation is most consistent with acute complicated  illness / injury requiring diagnostic workup.   Clinical Course as of 03/16/22 1537  Wed Mar 16, 2022  1142 Patient was reevaluated, reports that he feels significantly improved and feels ready to be discharged.  We discussed the importance of bland diet and decreased alcohol intake. [JP]    Clinical Course User Index [JP] Dennys Traughber, Herb Grays, PA-C     FINAL CLINICAL IMPRESSION(S) / ED DIAGNOSES   Final diagnoses:  Alcohol-induced acute pancreatitis without infection or necrosis     Rx / DC Orders   ED Discharge Orders          Ordered    acetaminophen (TYLENOL) 325 MG tablet  Every 6 hours PRN        03/16/22 1145             Note:  This document was prepared using Dragon voice recognition software and may include unintentional dictation errors.   Keturah Shavers 03/16/22 1537    Sharman Cheek, MD 03/16/22 [phone]

## 2022-03-22 NOTE — Progress Notes (Deleted)
Synopsis: Referred for *** by No ref. provider found  Subjective:   PATIENT ID: Mario Beck GENDER: male DOB: 06/24/1981, MRN: [id number]  No chief complaint on file.  This is a pleasant 41 yo male with a history of cardiomyopathy, recent admission for cardiogenic shock course c/b pleural effusion s/p thoracentesis, PE, loculated R pleural effusion s/p chest tube 4/13  Pleural effusion was exudative by protein and LDH, cyto negative, neutrophilic, cx neg. Possibly parapneumonic vs related to PE.   Otherwise pertinent review of systems is negative.  Past Medical History:  Diagnosis Date   Alcohol abuse    Asthma    CHF (congestive heart failure) (HCC)    Depression    Hypertension    Pulmonary embolus (HCC)      Family History  Problem Relation Age of Onset   Colon cancer Neg Hx    Esophageal cancer Neg Hx      Past Surgical History:  Procedure Laterality Date   FRACTURE SURGERY     Left leg as a kid   RIGHT/LEFT HEART CATH AND CORONARY ANGIOGRAPHY N/A 01/21/2022   Procedure: RIGHT/LEFT HEART CATH AND CORONARY ANGIOGRAPHY;  Surgeon: Wellington Hampshire, MD;  Location: [facility];  Service: Cardiovascular;  Laterality: N/A;    Social History   Socioeconomic History   Marital status: Legally Separated    Spouse name: Not on file   Number of children: 2   Years of education: Not on file   Highest education level: Not on file  Occupational History   Occupation: Production Management  Tobacco Use   Smoking status: Every Day    Packs/day: 1.00    Types: Cigarettes   Smokeless tobacco: Former    Types: Snuff   Tobacco comments:    Does pouches as welll   Vaping Use   Vaping Use: Never used  Substance and Sexual Activity   Alcohol use: Not Currently    Alcohol/week: 7.0 standard drinks    Types: 7 Cans of beer per week   Drug use: No   Sexual activity: Yes  Other Topics Concern   Not on file  Social History Narrative   Not on file   Social  Determinants of Health   Financial Resource Strain: Not on file  Food Insecurity: Not on file  Transportation Needs: Not on file  Physical Activity: Not on file  Stress: Not on file  Social Connections: Not on file  Intimate Partner Violence: Not on file     Allergies  Allergen Reactions   Lisinopril Swelling    Angioedema    Shellfish Allergy Swelling     Outpatient Medications Prior to Visit  Medication Sig Dispense Refill   acetaminophen (TYLENOL) 325 MG tablet Take 2 tablets (650 mg total) by mouth every 6 (six) hours as needed. 15 tablet 2   apixaban (ELIQUIS) 5 MG TABS tablet Take 1 tablet (5 mg total) by mouth 2 (two) times daily. 60 tablet 11   atorvastatin (LIPITOR) 10 MG tablet Take 1 tablet (10 mg total) by mouth once daily. 30 tablet 0   carvedilol (COREG) 3.125 MG tablet Take 1 tablet (3.125 mg total) by mouth 2 (two) times daily with a meal. 60 tablet 1   cefdinir (OMNICEF) 300 MG capsule Take 1 capsule (300 mg total) by mouth 2 (two) times daily for 10 days. 20 capsule 0   dapagliflozin propanediol (FARXIGA) 10 MG TABS tablet Take 1 tablet (10 mg total) by mouth once daily.  30 tablet 0   digoxin (LANOXIN) 0.125 MG tablet Take 1 tablet (0.125 mg total) by mouth once daily. 30 tablet 1   ferrous sulfate 325 (65 FE) MG tablet Take 1 tablet (325 mg total) by mouth 2 (two) times daily with a meal. 30 tablet 3   folic acid (FOLVITE) 1 MG tablet Take 1 tablet (1 mg total) by mouth once daily. 30 tablet 0   furosemide (LASIX) 40 MG tablet Take 1 tablet (40 mg total) by mouth once daily. 90 tablet 0   losartan (COZAAR) 25 MG tablet Take 1/2 tablet (12.5 mg total) by mouth once daily. 30 tablet 1   ondansetron (ZOFRAN-ODT) 4 MG disintegrating tablet Take 1 tablet (4 mg total) by mouth every 8 (eight) hours as needed for nausea or vomiting. 20 tablet 0   oxyCODONE (OXY IR/ROXICODONE) 5 MG immediate release tablet Take 1 tablet (5 mg total) by mouth every 6 (six) hours as needed  for severe pain or moderate pain. (Patient not taking: Reported on 02/09/2022) 5 tablet 0   pantoprazole (PROTONIX) 40 MG tablet Take 1 tablet (40 mg total) by mouth once daily. 90 tablet 0   senna-docusate (SENOKOT-S) 8.6-50 MG tablet Take 1 tablet by mouth 2 (two) times daily. 30 tablet 0   spironolactone (ALDACTONE) 25 MG tablet Take 1 tablet (25 mg total) by mouth once daily. 30 tablet 1   No facility-administered medications prior to visit.       Objective:   Physical Exam:  General appearance: 41 y.o., male, NAD, conversant  Eyes: anicteric sclerae; PERRL, tracking appropriately HENT: NCAT; MMM Neck: Trachea midline; no lymphadenopathy, no JVD Lungs: CTAB, no crackles, no wheeze, with normal respiratory effort CV: RRR, no murmur  Abdomen: Soft, non-tender; non-distended, BS present  Extremities: No peripheral edema, warm Skin: Normal turgor and texture; no rash Psych: Appropriate affect Neuro: Alert and oriented to person and place, no focal deficit     There were no vitals filed for this visit.   on *** LPM *** RA BMI Readings from Last 3 Encounters:  03/15/22 22.50 kg/m  03/14/22 23.75 kg/m  02/27/22 23.12 kg/m   Wt Readings from Last 3 Encounters:  03/15/22 180 lb (81.6 kg)  03/14/22 190 lb (86.2 kg)  02/27/22 185 lb (83.9 kg)     CBC    Component Value Date/Time   WBC 4.8 03/15/2022 2353   RBC 5.37 03/15/2022 2353   HGB 14.3 03/15/2022 2353   HCT 43.1 03/15/2022 2353   PLT 179 03/15/2022 2353   MCV 80.3 03/15/2022 2353   MCH 26.6 03/15/2022 2353   MCHC 33.2 03/15/2022 2353   RDW 19.4 (H) 03/15/2022 2353   LYMPHSABS 1.8 02/27/2022 1751   MONOABS 0.3 02/27/2022 1751   EOSABS 0.0 02/27/2022 1751   BASOSABS 0.0 02/27/2022 1751    ***  Chest Imaging: ***  Pulmonary Functions Testing Results:     View : No data to display.          FeNO: ***  Pathology: ***  Echocardiogram: ***  Heart Catheterization: ***    Assessment & Plan:     Plan:      Maryjane Hurter, MD [facility] 03/22/2022 4:53 PM

## 2022-03-23 ENCOUNTER — Inpatient Hospital Stay: Payer: Self-pay | Admitting: Student

## 2022-04-15 ENCOUNTER — Other Ambulatory Visit: Payer: Self-pay

## 2023-10-28 IMAGING — CT CT ANGIO CHEST
2 of 7 series · 17 of 46 positions shown · IV contrast (APPLIED)
Comparison: 01/17/2022

CLINICAL DATA: Pleuritic chest pain, shortness of breath

EXAM:
CT ANGIOGRAPHY CHEST WITH CONTRAST
TECHNIQUE: Multidetector CT imaging of the chest was performed using the
standard protocol during bolus administration of intravenous
contrast. Multiplanar CT image reconstructions and MIPs were
obtained to evaluate the vascular anatomy.

[Series 6: thins · axial · 0.73mm/px · z∈[-704,-458]mm · 14 of 344 slices shown]
[im 18/344  lung]
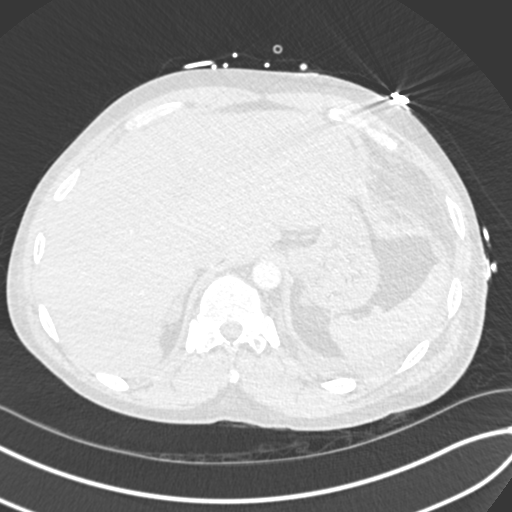
[im 52/344  soft-tissue]
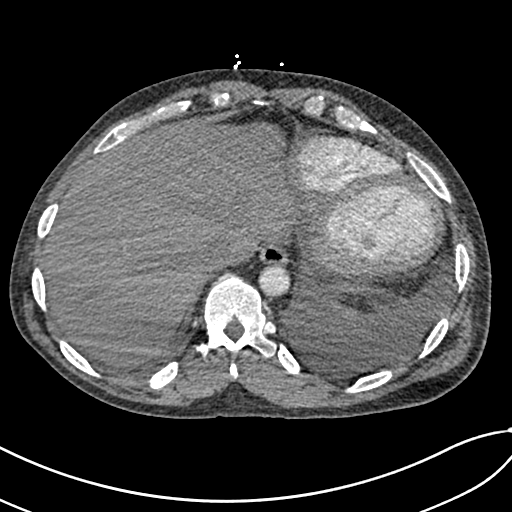
[im 69/344  lung]
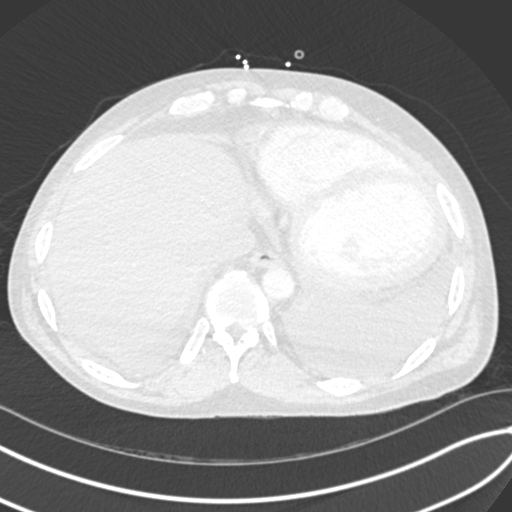
[im 86/344  soft-tissue]
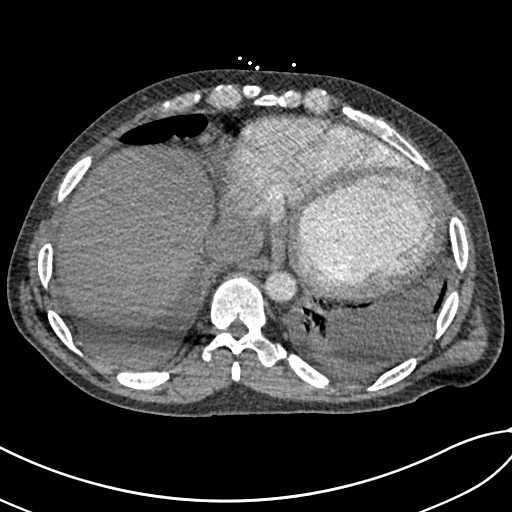
[im 121/344  lung]
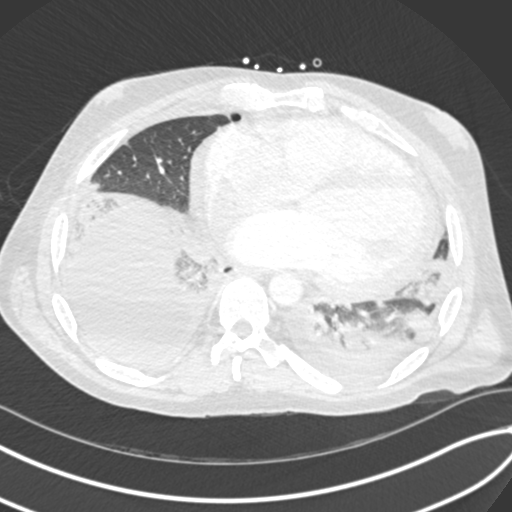
[im 138/344  soft-tissue]
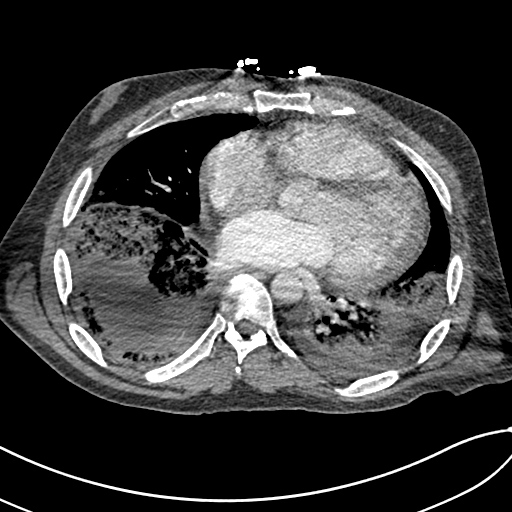
[im 155/344  lung]
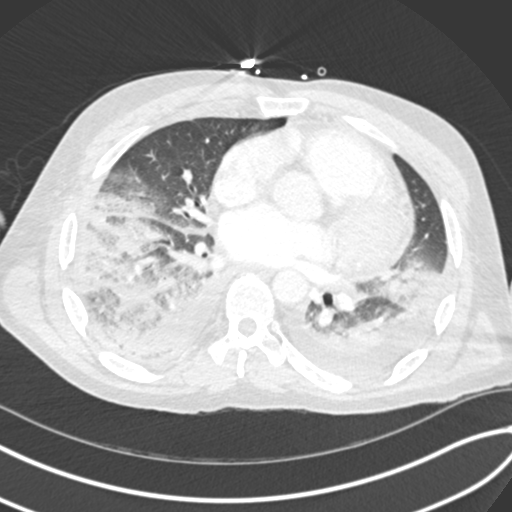
[im 189/344  soft-tissue]
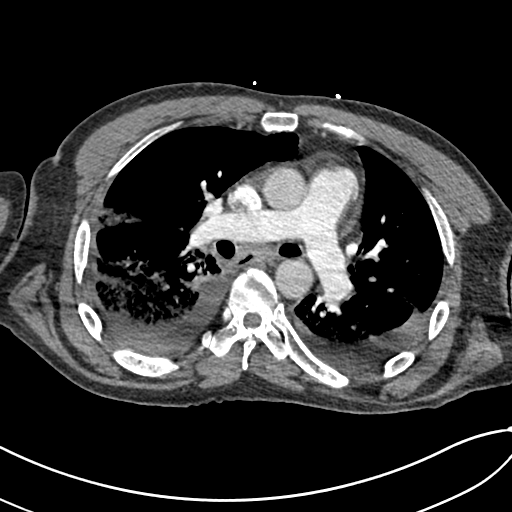
[im 206/344  lung]
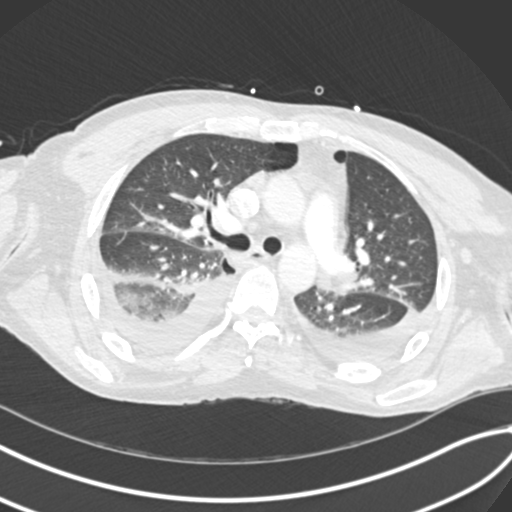
[im 223/344  soft-tissue]
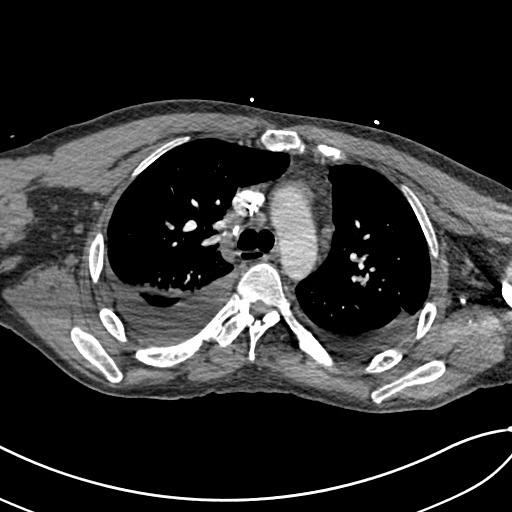
[im 258/344  lung]
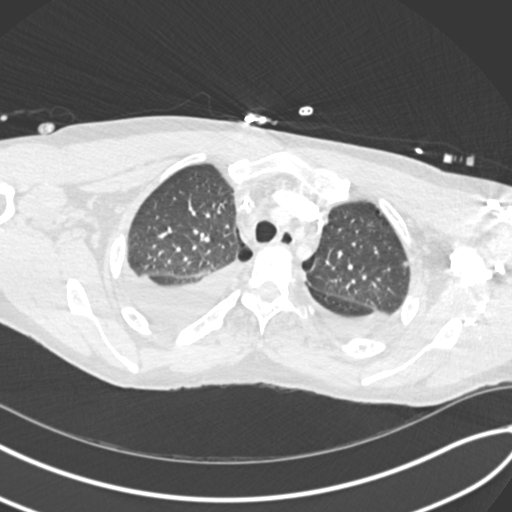
[im 275/344  soft-tissue]
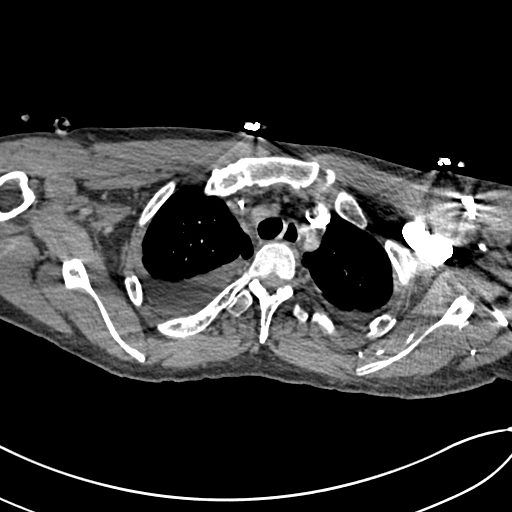
[im 292/344  lung]
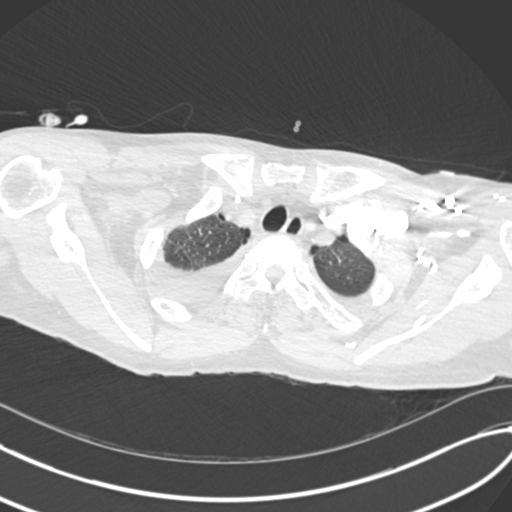
[im 326/344  soft-tissue]
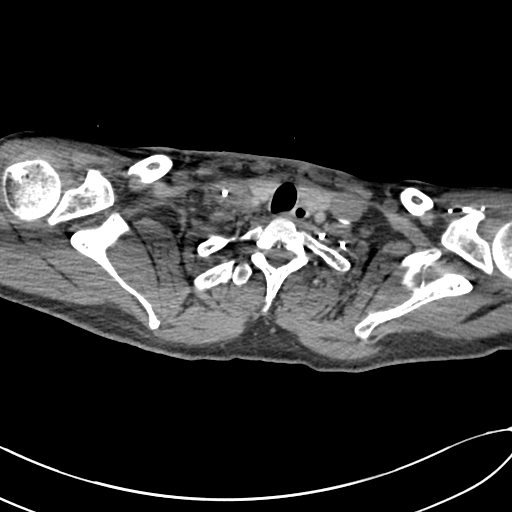

[Series 8: coronal mpr · coronal · 0.55mm/px · 3 of 85 slices shown]
[im 22/85  soft-tissue]
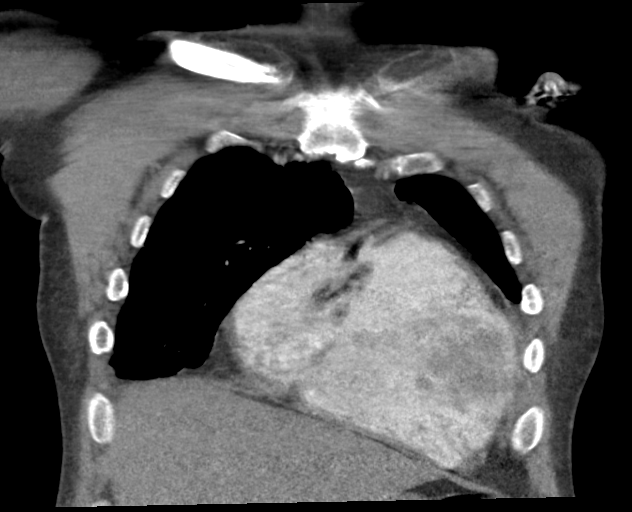
[im 43/85  soft-tissue]
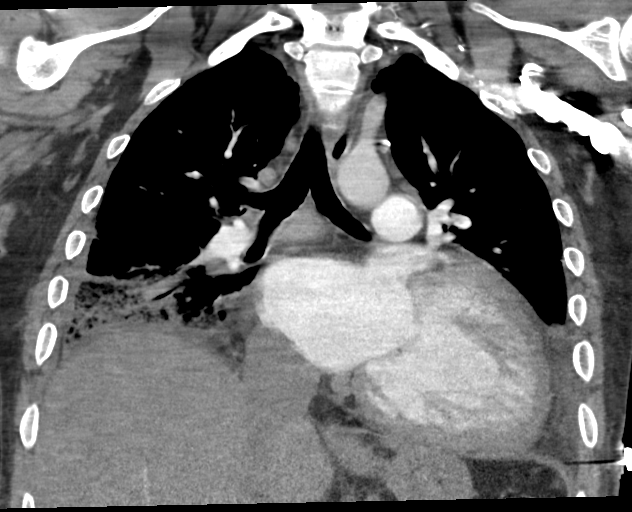
[im 64/85  soft-tissue]
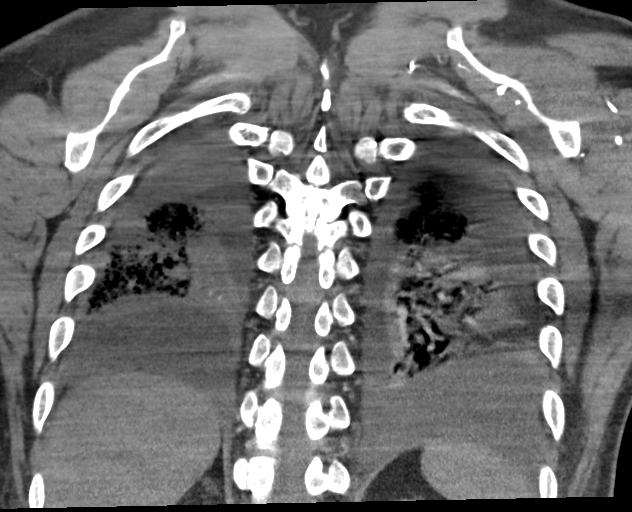

[17 of 46 positions shown; findings below may reference images not displayed]

RADIATION DOSE REDUCTION: This exam was performed according to the
departmental dose-optimization program which includes automated
exposure control, adjustment of the mA and/or kV according to
patient size and/or use of iterative reconstruction technique.

CONTRAST:  80mL OMNIPAQUE IOHEXOL 350 MG/ML SOLN
FINDINGS: Cardiovascular: There is marked enlargement of heart, especially the
left ventricular cavity. There is homogeneous enhancement in
thoracic aorta. There are no filling defects in the central
pulmonary artery branches. There are intraluminal filling defects in
the few segmental and subsegmental pulmonary artery branches in the
right upper lobe right middle lobe and right lower lobe. There is
small thrombus burden. There are no definite signs of acute right
ventricular strain. Evaluation of peripheral pulmonary artery
branches is limited by fairly extensive infiltrates in the mid and
lower lung fields.

Mediastinum/Nodes: There are slightly enlarged lymph nodes in the
mediastinum, possibly suggesting reactive hyperplasia.

Lungs/Pleura: Extensive infiltrates are seen in the posterior mid
and lower lung fields as well as in the lateral segment of right
middle lobe. Small to moderate bilateral pleural effusions are seen,
more so on the right side. There is no pneumothorax. There few
scattered blebs and bullae in the right middle lobe and both upper
lobes.

Upper Abdomen: There is fatty infiltration in the liver.

Musculoskeletal: Unremarkable. Gynecomastia is seen in both breasts.

Review of the MIP images confirms the above findings.
IMPRESSION: Acute pulmonary embolism in the multiple segmental and subsegmental
branches in the right lung. There is small thrombus burden. There
are no signs of acute right ventricular strain.

There is significant interval worsening of infiltrates in both
lungs, especially in the posterior mid and lower lung fields.
Findings suggest significant interval worsening of multifocal
pneumonia. Part of the infiltrates in the right lower lung fields
may be due to pulmonary infarction.

Fatty liver. Marked cardiomegaly. There are scattered coronary
artery calcifications.

Imaging finding of acute pulmonary embolism was relayed to patient's
provider Dr. Lovely by telephone call.
